# Patient Record
Sex: Male | Born: 1955 | Race: Black or African American | Hispanic: No | Smoking: Former smoker
Health system: Southern US, Community
[De-identification: ages and names within clinical notes are randomized; demographics above are authoritative.]

## PROBLEM LIST (undated history)

## (undated) DIAGNOSIS — I219 Acute myocardial infarction, unspecified: Secondary | ICD-10-CM

## (undated) DIAGNOSIS — I2510 Atherosclerotic heart disease of native coronary artery without angina pectoris: Secondary | ICD-10-CM

## (undated) DIAGNOSIS — I469 Cardiac arrest, cause unspecified: Secondary | ICD-10-CM

## (undated) DIAGNOSIS — I639 Cerebral infarction, unspecified: Secondary | ICD-10-CM

## (undated) DIAGNOSIS — I499 Cardiac arrhythmia, unspecified: Secondary | ICD-10-CM

## (undated) HISTORY — DX: Cerebral infarction, unspecified: I63.9

## (undated) HISTORY — PX: CARDIAC CATHETERIZATION: SHX172

---

## 1999-01-05 ENCOUNTER — Encounter: Payer: Self-pay | Admitting: Internal Medicine

## 1999-01-05 ENCOUNTER — Ambulatory Visit (HOSPITAL_COMMUNITY): Admission: RE | Admit: 1999-01-05 | Discharge: 1999-01-05 | Payer: Self-pay | Admitting: Internal Medicine

## 2005-10-07 ENCOUNTER — Emergency Department (HOSPITAL_COMMUNITY): Admission: EM | Admit: 2005-10-07 | Discharge: 2005-10-07 | Payer: Self-pay | Admitting: Emergency Medicine

## 2006-02-22 ENCOUNTER — Observation Stay (HOSPITAL_COMMUNITY): Admission: EM | Admit: 2006-02-22 | Discharge: 2006-02-23 | Payer: Self-pay | Admitting: Emergency Medicine

## 2006-02-22 ENCOUNTER — Ambulatory Visit: Payer: Self-pay | Admitting: Cardiology

## 2006-02-25 ENCOUNTER — Ambulatory Visit: Payer: Self-pay | Admitting: Internal Medicine

## 2006-02-25 ENCOUNTER — Ambulatory Visit (HOSPITAL_COMMUNITY): Admission: RE | Admit: 2006-02-25 | Discharge: 2006-02-25 | Payer: Self-pay | Admitting: Cardiology

## 2010-03-24 ENCOUNTER — Encounter: Payer: Self-pay | Admitting: Cardiology

## 2020-07-21 ENCOUNTER — Inpatient Hospital Stay (HOSPITAL_COMMUNITY): Payer: Medicare Other

## 2020-07-21 ENCOUNTER — Inpatient Hospital Stay (HOSPITAL_COMMUNITY)
Admission: EM | Admit: 2020-07-21 | Discharge: 2020-08-10 | DRG: 246 | Disposition: A | Discharge status: Rehabilitation Facility | Payer: Medicare Other | Attending: Internal Medicine | Admitting: Internal Medicine

## 2020-07-21 ENCOUNTER — Inpatient Hospital Stay (HOSPITAL_COMMUNITY)
Admission: EM | Disposition: A | Discharge status: Rehabilitation Facility | Payer: Self-pay | Source: Home / Self Care | Attending: Cardiology

## 2020-07-21 ENCOUNTER — Emergency Department (HOSPITAL_COMMUNITY): Payer: Medicare Other

## 2020-07-21 DIAGNOSIS — I2510 Atherosclerotic heart disease of native coronary artery without angina pectoris: Secondary | ICD-10-CM | POA: Diagnosis present

## 2020-07-21 DIAGNOSIS — I255 Ischemic cardiomyopathy: Secondary | ICD-10-CM | POA: Diagnosis present

## 2020-07-21 DIAGNOSIS — I5022 Chronic systolic (congestive) heart failure: Secondary | ICD-10-CM | POA: Diagnosis not present

## 2020-07-21 DIAGNOSIS — R1312 Dysphagia, oropharyngeal phase: Secondary | ICD-10-CM | POA: Diagnosis present

## 2020-07-21 DIAGNOSIS — I2102 ST elevation (STEMI) myocardial infarction involving left anterior descending coronary artery: Secondary | ICD-10-CM | POA: Diagnosis present

## 2020-07-21 DIAGNOSIS — J9601 Acute respiratory failure with hypoxia: Secondary | ICD-10-CM | POA: Diagnosis present

## 2020-07-21 DIAGNOSIS — J90 Pleural effusion, not elsewhere classified: Secondary | ICD-10-CM | POA: Diagnosis present

## 2020-07-21 DIAGNOSIS — E876 Hypokalemia: Secondary | ICD-10-CM | POA: Diagnosis present

## 2020-07-21 DIAGNOSIS — I6330 Cerebral infarction due to thrombosis of unspecified cerebral artery: Secondary | ICD-10-CM | POA: Diagnosis not present

## 2020-07-21 DIAGNOSIS — E870 Hyperosmolality and hypernatremia: Secondary | ICD-10-CM | POA: Diagnosis present

## 2020-07-21 DIAGNOSIS — G931 Anoxic brain damage, not elsewhere classified: Secondary | ICD-10-CM | POA: Diagnosis present

## 2020-07-21 DIAGNOSIS — Z4659 Encounter for fitting and adjustment of other gastrointestinal appliance and device: Secondary | ICD-10-CM

## 2020-07-21 DIAGNOSIS — R1310 Dysphagia, unspecified: Secondary | ICD-10-CM | POA: Diagnosis not present

## 2020-07-21 DIAGNOSIS — E119 Type 2 diabetes mellitus without complications: Secondary | ICD-10-CM | POA: Diagnosis present

## 2020-07-21 DIAGNOSIS — I469 Cardiac arrest, cause unspecified: Secondary | ICD-10-CM

## 2020-07-21 DIAGNOSIS — I10 Essential (primary) hypertension: Secondary | ICD-10-CM | POA: Diagnosis not present

## 2020-07-21 DIAGNOSIS — I639 Cerebral infarction, unspecified: Secondary | ICD-10-CM | POA: Diagnosis present

## 2020-07-21 DIAGNOSIS — Z9289 Personal history of other medical treatment: Secondary | ICD-10-CM

## 2020-07-21 DIAGNOSIS — E785 Hyperlipidemia, unspecified: Secondary | ICD-10-CM | POA: Diagnosis present

## 2020-07-21 DIAGNOSIS — E43 Unspecified severe protein-calorie malnutrition: Secondary | ICD-10-CM | POA: Diagnosis present

## 2020-07-21 DIAGNOSIS — Z7189 Other specified counseling: Secondary | ICD-10-CM | POA: Diagnosis not present

## 2020-07-21 DIAGNOSIS — R570 Cardiogenic shock: Secondary | ICD-10-CM

## 2020-07-21 DIAGNOSIS — Z0189 Encounter for other specified special examinations: Secondary | ICD-10-CM

## 2020-07-21 DIAGNOSIS — R059 Cough, unspecified: Secondary | ICD-10-CM | POA: Diagnosis not present

## 2020-07-21 DIAGNOSIS — Z7982 Long term (current) use of aspirin: Secondary | ICD-10-CM

## 2020-07-21 DIAGNOSIS — F17210 Nicotine dependence, cigarettes, uncomplicated: Secondary | ICD-10-CM | POA: Diagnosis present

## 2020-07-21 DIAGNOSIS — I462 Cardiac arrest due to underlying cardiac condition: Secondary | ICD-10-CM | POA: Diagnosis present

## 2020-07-21 DIAGNOSIS — I110 Hypertensive heart disease with heart failure: Secondary | ICD-10-CM | POA: Diagnosis present

## 2020-07-21 DIAGNOSIS — E46 Unspecified protein-calorie malnutrition: Secondary | ICD-10-CM

## 2020-07-21 DIAGNOSIS — I501 Left ventricular failure: Secondary | ICD-10-CM | POA: Diagnosis present

## 2020-07-21 DIAGNOSIS — N179 Acute kidney failure, unspecified: Secondary | ICD-10-CM | POA: Diagnosis present

## 2020-07-21 DIAGNOSIS — R339 Retention of urine, unspecified: Secondary | ICD-10-CM | POA: Diagnosis present

## 2020-07-21 DIAGNOSIS — G9341 Metabolic encephalopathy: Secondary | ICD-10-CM | POA: Diagnosis present

## 2020-07-21 DIAGNOSIS — R4182 Altered mental status, unspecified: Secondary | ICD-10-CM | POA: Diagnosis not present

## 2020-07-21 DIAGNOSIS — J690 Pneumonitis due to inhalation of food and vomit: Secondary | ICD-10-CM | POA: Diagnosis not present

## 2020-07-21 DIAGNOSIS — Y92009 Unspecified place in unspecified non-institutional (private) residence as the place of occurrence of the external cause: Secondary | ICD-10-CM

## 2020-07-21 DIAGNOSIS — I213 ST elevation (STEMI) myocardial infarction of unspecified site: Secondary | ICD-10-CM | POA: Diagnosis not present

## 2020-07-21 DIAGNOSIS — Y95 Nosocomial condition: Secondary | ICD-10-CM | POA: Diagnosis present

## 2020-07-21 DIAGNOSIS — K7200 Acute and subacute hepatic failure without coma: Secondary | ICD-10-CM | POA: Diagnosis present

## 2020-07-21 DIAGNOSIS — Z681 Body mass index (BMI) 19 or less, adult: Secondary | ICD-10-CM | POA: Diagnosis not present

## 2020-07-21 DIAGNOSIS — I4901 Ventricular fibrillation: Secondary | ICD-10-CM | POA: Diagnosis present

## 2020-07-21 DIAGNOSIS — R0902 Hypoxemia: Secondary | ICD-10-CM

## 2020-07-21 DIAGNOSIS — J189 Pneumonia, unspecified organism: Secondary | ICD-10-CM

## 2020-07-21 DIAGNOSIS — G253 Myoclonus: Secondary | ICD-10-CM | POA: Diagnosis present

## 2020-07-21 DIAGNOSIS — D638 Anemia in other chronic diseases classified elsewhere: Secondary | ICD-10-CM | POA: Diagnosis present

## 2020-07-21 DIAGNOSIS — I509 Heart failure, unspecified: Secondary | ICD-10-CM

## 2020-07-21 DIAGNOSIS — Z9889 Other specified postprocedural states: Secondary | ICD-10-CM

## 2020-07-21 DIAGNOSIS — I5021 Acute systolic (congestive) heart failure: Secondary | ICD-10-CM | POA: Diagnosis not present

## 2020-07-21 DIAGNOSIS — Z7902 Long term (current) use of antithrombotics/antiplatelets: Secondary | ICD-10-CM

## 2020-07-21 DIAGNOSIS — R5381 Other malaise: Secondary | ICD-10-CM | POA: Diagnosis not present

## 2020-07-21 DIAGNOSIS — J811 Chronic pulmonary edema: Secondary | ICD-10-CM | POA: Diagnosis not present

## 2020-07-21 DIAGNOSIS — I252 Old myocardial infarction: Secondary | ICD-10-CM

## 2020-07-21 DIAGNOSIS — R531 Weakness: Secondary | ICD-10-CM | POA: Diagnosis not present

## 2020-07-21 DIAGNOSIS — I63412 Cerebral infarction due to embolism of left middle cerebral artery: Secondary | ICD-10-CM | POA: Diagnosis not present

## 2020-07-21 DIAGNOSIS — R7401 Elevation of levels of liver transaminase levels: Secondary | ICD-10-CM

## 2020-07-21 DIAGNOSIS — Z20822 Contact with and (suspected) exposure to covid-19: Secondary | ICD-10-CM | POA: Diagnosis present

## 2020-07-21 DIAGNOSIS — I6389 Other cerebral infarction: Secondary | ICD-10-CM | POA: Diagnosis not present

## 2020-07-21 DIAGNOSIS — Z515 Encounter for palliative care: Secondary | ICD-10-CM | POA: Diagnosis not present

## 2020-07-21 DIAGNOSIS — R682 Dry mouth, unspecified: Secondary | ICD-10-CM | POA: Diagnosis present

## 2020-07-21 DIAGNOSIS — I63519 Cerebral infarction due to unspecified occlusion or stenosis of unspecified middle cerebral artery: Secondary | ICD-10-CM | POA: Diagnosis not present

## 2020-07-21 DIAGNOSIS — Z452 Encounter for adjustment and management of vascular access device: Secondary | ICD-10-CM

## 2020-07-21 DIAGNOSIS — I480 Paroxysmal atrial fibrillation: Secondary | ICD-10-CM | POA: Diagnosis present

## 2020-07-21 DIAGNOSIS — Z603 Acculturation difficulty: Secondary | ICD-10-CM | POA: Diagnosis present

## 2020-07-21 HISTORY — PX: CORONARY/GRAFT ACUTE MI REVASCULARIZATION: CATH118305

## 2020-07-21 HISTORY — DX: Atherosclerotic heart disease of native coronary artery without angina pectoris: I25.10

## 2020-07-21 HISTORY — DX: Cardiac arrhythmia, unspecified: I49.9

## 2020-07-21 HISTORY — PX: LEFT HEART CATH AND CORONARY ANGIOGRAPHY: CATH118249

## 2020-07-21 HISTORY — PX: RIGHT HEART CATH: CATH118263

## 2020-07-21 HISTORY — DX: Cardiac arrest, cause unspecified: I46.9

## 2020-07-21 HISTORY — DX: Acute myocardial infarction, unspecified: I21.9

## 2020-07-21 LAB — COOXEMETRY PANEL
Carboxyhemoglobin: 0.7 % (ref 0.5–1.5)
Methemoglobin: 1.3 % (ref 0.0–1.5)
O2 Saturation: 64.1 %
Total hemoglobin: 10.3 g/dL — ABNORMAL LOW (ref 12.0–16.0)

## 2020-07-21 LAB — GLUCOSE, CAPILLARY
Glucose-Capillary: 256 mg/dL — ABNORMAL HIGH (ref 70–99)
Glucose-Capillary: 259 mg/dL — ABNORMAL HIGH (ref 70–99)
Glucose-Capillary: 277 mg/dL — ABNORMAL HIGH (ref 70–99)
Glucose-Capillary: 170 mg/dL — ABNORMAL HIGH (ref 70–99)
Glucose-Capillary: 101 mg/dL — ABNORMAL HIGH (ref 70–99)
Glucose-Capillary: 52 mg/dL — ABNORMAL LOW (ref 70–99)
Glucose-Capillary: 67 mg/dL — ABNORMAL LOW (ref 70–99)
Glucose-Capillary: >600 mg/dL (ref 70–99)

## 2020-07-21 LAB — COMPREHENSIVE METABOLIC PANEL
ALT: 251 U/L — ABNORMAL HIGH (ref 0–44)
ALT: 174 U/L — ABNORMAL HIGH (ref 0–44)
AST: 328 U/L — ABNORMAL HIGH (ref 15–41)
AST: 250 U/L — ABNORMAL HIGH (ref 15–41)
Albumin: 3.2 g/dL — ABNORMAL LOW (ref 3.5–5.0)
Albumin: 2.2 g/dL — ABNORMAL LOW (ref 3.5–5.0)
Alkaline Phosphatase: 118 U/L (ref 38–126)
Alkaline Phosphatase: 71 U/L (ref 38–126)
Anion gap: 11 (ref 5–15)
Anion gap: 5 (ref 5–15)
BUN: 14 mg/dL (ref 8–23)
BUN: 15 mg/dL (ref 8–23)
CO2: 20 mmol/L — ABNORMAL LOW (ref 22–32)
CO2: 32 mmol/L (ref 22–32)
Calcium: 7.8 mg/dL — ABNORMAL LOW (ref 8.9–10.3)
Calcium: 6.5 mg/dL — ABNORMAL LOW (ref 8.9–10.3)
Chloride: 105 mmol/L (ref 98–111)
Chloride: 107 mmol/L (ref 98–111)
Creatinine, Ser: 1.4 mg/dL — ABNORMAL HIGH (ref 0.61–1.24)
Creatinine, Ser: 1.28 mg/dL — ABNORMAL HIGH (ref 0.61–1.24)
GFR, Estimated: 55 mL/min — ABNORMAL LOW (ref >60)
GFR, Estimated: >60 mL/min (ref >60)
Glucose, Bld: 275 mg/dL — ABNORMAL HIGH (ref 70–99)
Glucose, Bld: 286 mg/dL — ABNORMAL HIGH (ref 70–99)
Potassium: 4.8 mmol/L (ref 3.5–5.1)
Potassium: 4 mmol/L (ref 3.5–5.1)
Sodium: 136 mmol/L (ref 135–145)
Sodium: 144 mmol/L (ref 135–145)
Total Bilirubin: 1.1 mg/dL (ref 0.3–1.2)
Total Bilirubin: 0.1 mg/dL — ABNORMAL LOW (ref 0.3–1.2)
Total Protein: 5.7 g/dL — ABNORMAL LOW (ref 6.5–8.1)
Total Protein: 3.9 g/dL — ABNORMAL LOW (ref 6.5–8.1)

## 2020-07-21 LAB — CBC WITH DIFFERENTIAL/PLATELET
Abs Immature Granulocytes: 0.18 10*3/uL — ABNORMAL HIGH (ref 0.00–0.07)
Abs Immature Granulocytes: 0.12 10*3/uL — ABNORMAL HIGH (ref 0.00–0.07)
Basophils Absolute: 0.1 10*3/uL (ref 0.0–0.1)
Basophils Absolute: 0 10*3/uL (ref 0.0–0.1)
Basophils Relative: 1 %
Basophils Relative: 0 %
Eosinophils Absolute: 0.5 10*3/uL (ref 0.0–0.5)
Eosinophils Absolute: 0.1 10*3/uL (ref 0.0–0.5)
Eosinophils Relative: 3 %
Eosinophils Relative: 0 %
HCT: 42.3 % (ref 39.0–52.0)
HCT: 29.2 % — ABNORMAL LOW (ref 39.0–52.0)
Hemoglobin: 13.8 g/dL (ref 13.0–17.0)
Hemoglobin: 9.7 g/dL — ABNORMAL LOW (ref 13.0–17.0)
Immature Granulocytes: 1 %
Immature Granulocytes: 1 %
Lymphocytes Relative: 43 %
Lymphocytes Relative: 12 %
Lymphs Abs: 5.6 10*3/uL — ABNORMAL HIGH (ref 0.7–4.0)
Lymphs Abs: 2 10*3/uL (ref 0.7–4.0)
MCH: 31.7 pg (ref 26.0–34.0)
MCH: 31.6 pg (ref 26.0–34.0)
MCHC: 32.6 g/dL (ref 30.0–36.0)
MCHC: 33.2 g/dL (ref 30.0–36.0)
MCV: 97 fL (ref 80.0–100.0)
MCV: 95.1 fL (ref 80.0–100.0)
Monocytes Absolute: 0.5 10*3/uL (ref 0.1–1.0)
Monocytes Absolute: 1.1 10*3/uL — ABNORMAL HIGH (ref 0.1–1.0)
Monocytes Relative: 3 %
Monocytes Relative: 7 %
Neutro Abs: 6.4 10*3/uL (ref 1.7–7.7)
Neutro Abs: 13.4 10*3/uL — ABNORMAL HIGH (ref 1.7–7.7)
Neutrophils Relative %: 49 %
Neutrophils Relative %: 80 %
Platelets: 216 10*3/uL (ref 150–400)
Platelets: 205 10*3/uL (ref 150–400)
RBC: 4.36 MIL/uL (ref 4.22–5.81)
RBC: 3.07 MIL/uL — ABNORMAL LOW (ref 4.22–5.81)
RDW: 13.7 % (ref 11.5–15.5)
RDW: 13.8 % (ref 11.5–15.5)
WBC: 13.2 10*3/uL — ABNORMAL HIGH (ref 4.0–10.5)
WBC: 16.7 10*3/uL — ABNORMAL HIGH (ref 4.0–10.5)
nRBC: 0 % (ref 0.0–0.2)
nRBC: 0 % (ref 0.0–0.2)

## 2020-07-21 LAB — POCT ACTIVATED CLOTTING TIME
Activated Clotting Time: 214 seconds
Activated Clotting Time: 595 seconds

## 2020-07-21 LAB — RESP PANEL BY RT-PCR (FLU A&B, COVID) ARPGX2
Influenza A by PCR: NEGATIVE
Influenza B by PCR: NEGATIVE
SARS Coronavirus 2 by RT PCR: NEGATIVE

## 2020-07-21 LAB — POCT I-STAT 7, (LYTES, BLD GAS, ICA,H+H)
Acid-Base Excess: 2 mmol/L (ref 0.0–2.0)
Acid-base deficit: 6 mmol/L — ABNORMAL HIGH (ref 0.0–2.0)
Bicarbonate: 21.7 mmol/L (ref 20.0–28.0)
Bicarbonate: 28.3 mmol/L — ABNORMAL HIGH (ref 20.0–28.0)
Calcium, Ion: 1.07 mmol/L — ABNORMAL LOW (ref 1.15–1.40)
Calcium, Ion: 0.97 mmol/L — ABNORMAL LOW (ref 1.15–1.40)
HCT: 39 % (ref 39.0–52.0)
HCT: 28 % — ABNORMAL LOW (ref 39.0–52.0)
Hemoglobin: 13.3 g/dL (ref 13.0–17.0)
Hemoglobin: 9.5 g/dL — ABNORMAL LOW (ref 13.0–17.0)
O2 Saturation: 96 %
O2 Saturation: 88 %
Patient temperature: 97.9
Potassium: 4.3 mmol/L (ref 3.5–5.1)
Potassium: 3.7 mmol/L (ref 3.5–5.1)
Sodium: 138 mmol/L (ref 135–145)
Sodium: 146 mmol/L — ABNORMAL HIGH (ref 135–145)
TCO2: 23 mmol/L (ref 22–32)
TCO2: 30 mmol/L (ref 22–32)
pCO2 arterial: 49.6 mmHg — ABNORMAL HIGH (ref 32.0–48.0)
pCO2 arterial: 53 mmHg — ABNORMAL HIGH (ref 32.0–48.0)
pH, Arterial: 7.25 — ABNORMAL LOW (ref 7.350–7.450)
pH, Arterial: 7.334 — ABNORMAL LOW (ref 7.350–7.450)
pO2, Arterial: 94 mmHg (ref 83.0–108.0)
pO2, Arterial: 57 mmHg — ABNORMAL LOW (ref 83.0–108.0)

## 2020-07-21 LAB — POCT I-STAT EG7
Acid-base deficit: 5 mmol/L — ABNORMAL HIGH (ref 0.0–2.0)
Acid-base deficit: 5 mmol/L — ABNORMAL HIGH (ref 0.0–2.0)
Acid-base deficit: 6 mmol/L — ABNORMAL HIGH (ref 0.0–2.0)
Bicarbonate: 23 mmol/L (ref 20.0–28.0)
Bicarbonate: 23.6 mmol/L (ref 20.0–28.0)
Bicarbonate: 23.9 mmol/L (ref 20.0–28.0)
Calcium, Ion: 1.09 mmol/L — ABNORMAL LOW (ref 1.15–1.40)
Calcium, Ion: 1.1 mmol/L — ABNORMAL LOW (ref 1.15–1.40)
Calcium, Ion: 1.1 mmol/L — ABNORMAL LOW (ref 1.15–1.40)
HCT: 39 % (ref 39.0–52.0)
HCT: 40 % (ref 39.0–52.0)
HCT: 40 % (ref 39.0–52.0)
Hemoglobin: 13.3 g/dL (ref 13.0–17.0)
Hemoglobin: 13.6 g/dL (ref 13.0–17.0)
Hemoglobin: 13.6 g/dL (ref 13.0–17.0)
O2 Saturation: 48 %
O2 Saturation: 53 %
O2 Saturation: 55 %
Potassium: 4.2 mmol/L (ref 3.5–5.1)
Potassium: 4.2 mmol/L (ref 3.5–5.1)
Potassium: 4.2 mmol/L (ref 3.5–5.1)
Sodium: 139 mmol/L (ref 135–145)
Sodium: 139 mmol/L (ref 135–145)
Sodium: 140 mmol/L (ref 135–145)
TCO2: 25 mmol/L (ref 22–32)
TCO2: 25 mmol/L (ref 22–32)
TCO2: 26 mmol/L (ref 22–32)
pCO2, Ven: 56.9 mmHg (ref 44.0–60.0)
pCO2, Ven: 57 mmHg (ref 44.0–60.0)
pCO2, Ven: 58.9 mmHg (ref 44.0–60.0)
pH, Ven: 7.214 — ABNORMAL LOW (ref 7.250–7.430)
pH, Ven: 7.216 — ABNORMAL LOW (ref 7.250–7.430)
pH, Ven: 7.225 — ABNORMAL LOW (ref 7.250–7.430)
pO2, Ven: 32 mmHg (ref 32.0–45.0)
pO2, Ven: 34 mmHg (ref 32.0–45.0)
pO2, Ven: 35 mmHg (ref 32.0–45.0)

## 2020-07-21 LAB — MAGNESIUM: Magnesium: 1.9 mg/dL (ref 1.7–2.4)

## 2020-07-21 LAB — HEMOGLOBIN A1C
Hgb A1c MFr Bld: 6.5 % — ABNORMAL HIGH (ref 4.8–5.6)
Mean Plasma Glucose: 139.85 mg/dL

## 2020-07-21 LAB — LIPID PANEL
Cholesterol: 172 mg/dL (ref 0–200)
HDL: 35 mg/dL — ABNORMAL LOW (ref >40)
LDL Cholesterol: 108 mg/dL — ABNORMAL HIGH (ref 0–99)
Total CHOL/HDL Ratio: 4.9 RATIO
Triglycerides: 144 mg/dL (ref <150)
VLDL: 29 mg/dL (ref 0–40)

## 2020-07-21 LAB — APTT
aPTT: 134 seconds — ABNORMAL HIGH (ref 24–36)
aPTT: 53 seconds — ABNORMAL HIGH (ref 24–36)

## 2020-07-21 LAB — POCT I-STAT, CHEM 8
BUN: 14 mg/dL (ref 8–23)
Calcium, Ion: 1.07 mmol/L — ABNORMAL LOW (ref 1.15–1.40)
Chloride: 105 mmol/L (ref 98–111)
Creatinine, Ser: 1.3 mg/dL — ABNORMAL HIGH (ref 0.61–1.24)
Glucose, Bld: 279 mg/dL — ABNORMAL HIGH (ref 70–99)
HCT: 43 % (ref 39.0–52.0)
Hemoglobin: 14.6 g/dL (ref 13.0–17.0)
Potassium: 4.2 mmol/L (ref 3.5–5.1)
Sodium: 140 mmol/L (ref 135–145)
TCO2: 21 mmol/L — ABNORMAL LOW (ref 22–32)

## 2020-07-21 LAB — TROPONIN I (HIGH SENSITIVITY)
Troponin I (High Sensitivity): 2747 ng/L (ref <18)
Troponin I (High Sensitivity): 9770 ng/L (ref <18)

## 2020-07-21 LAB — PROTIME-INR
INR: 1.4 — ABNORMAL HIGH (ref 0.8–1.2)
INR: 1.3 — ABNORMAL HIGH (ref 0.8–1.2)
Prothrombin Time: 16.9 seconds — ABNORMAL HIGH (ref 11.4–15.2)
Prothrombin Time: 15.8 seconds — ABNORMAL HIGH (ref 11.4–15.2)

## 2020-07-21 LAB — ECHOCARDIOGRAM COMPLETE
Area-P 1/2: 4.06 cm2
Height: 64 in
S' Lateral: 3.1 cm
Weight: 1813.06 oz

## 2020-07-21 LAB — LACTIC ACID, PLASMA
Lactic Acid, Venous: 3.7 mmol/L (ref 0.5–1.9)
Lactic Acid, Venous: 3.3 mmol/L (ref 0.5–1.9)

## 2020-07-21 LAB — MRSA PCR SCREENING: MRSA by PCR: NEGATIVE

## 2020-07-21 SURGERY — CORONARY/GRAFT ACUTE MI REVASCULARIZATION
Anesthesia: LOCAL

## 2020-07-21 MED ORDER — SODIUM CHLORIDE 0.9 % IV SOLN
250.0000 mL | INTRAVENOUS | Status: DC
  Administered 2020-07-25 – 2020-08-06 (×2): 250 mL via INTRAVENOUS

## 2020-07-21 MED ORDER — SODIUM CHLORIDE 0.9% FLUSH
3.0000 mL | Freq: Two times a day (BID) | INTRAVENOUS | Status: DC
  Administered 2020-07-21 – 2020-08-03 (×21): 3 mL via INTRAVENOUS

## 2020-07-21 MED ORDER — SODIUM CHLORIDE 0.9 % IV SOLN: 4.0000 ug/kg/min | INTRAVENOUS | Status: DC

## 2020-07-21 MED ORDER — TICAGRELOR 90 MG PO TABS
180.0000 mg | ORAL_TABLET | Freq: Once | ORAL | Status: AC
  Administered 2020-07-21: 180 mg via ORAL
  Filled 2020-07-21: qty 2

## 2020-07-21 MED ORDER — VERAPAMIL HCL 2.5 MG/ML IV SOLN
INTRAVENOUS | Status: DC | PRN
  Administered 2020-07-21: 10 mL via INTRA_ARTERIAL

## 2020-07-21 MED ORDER — SODIUM BICARBONATE 8.4 % IV SOLN
INTRAVENOUS | Status: DC
  Filled 2020-07-21 (×3): qty 1000

## 2020-07-21 MED ORDER — SODIUM CHLORIDE 0.9 % IV SOLN
INTRAVENOUS | Status: AC | PRN
  Administered 2020-07-21: 4 ug/kg/min via INTRAVENOUS

## 2020-07-21 MED ORDER — HEPARIN (PORCINE) IN NACL 1000-0.9 UT/500ML-% IV SOLN
INTRAVENOUS | Status: AC
  Filled 2020-07-21: qty 1000

## 2020-07-21 MED ORDER — SODIUM CHLORIDE 0.9 % IV SOLN: INTRAVENOUS | Status: DC

## 2020-07-21 MED ORDER — MIDAZOLAM 50MG/50ML (1MG/ML) PREMIX INFUSION
0.0000 mg/h | INTRAVENOUS | Status: DC
  Administered 2020-07-21: 4 mg/h via INTRAVENOUS
  Administered 2020-07-21: 5 mg/h via INTRAVENOUS
  Administered 2020-07-21: 3 mg/h via INTRAVENOUS
  Administered 2020-07-21: 4 mg/h via INTRAVENOUS
  Administered 2020-07-21: 1 mg/h via INTRAVENOUS
  Filled 2020-07-21 (×4): qty 50

## 2020-07-21 MED ORDER — ORAL CARE MOUTH RINSE
15.0000 mL | OROMUCOSAL | Status: DC
  Administered 2020-07-21 – 2020-07-31 (×99): 15 mL via OROMUCOSAL

## 2020-07-21 MED ORDER — PIPERACILLIN-TAZOBACTAM 3.375 G IVPB
3.3750 g | Freq: Three times a day (TID) | INTRAVENOUS | Status: AC
  Administered 2020-07-21 – 2020-07-27 (×21): 3.375 g via INTRAVENOUS
  Filled 2020-07-21 (×21): qty 50

## 2020-07-21 MED ORDER — NITROGLYCERIN 1 MG/10 ML FOR IR/CATH LAB
INTRA_ARTERIAL | Status: AC
  Filled 2020-07-21: qty 10

## 2020-07-21 MED ORDER — SODIUM CHLORIDE 0.9% FLUSH: 10.0000 mL | INTRAVENOUS | Status: DC | PRN

## 2020-07-21 MED ORDER — PHENYLEPHRINE HCL-NACL 10-0.9 MG/250ML-% IV SOLN
25.0000 ug/min | INTRAVENOUS | Status: AC
  Administered 2020-07-22: 80 ug/min via INTRAVENOUS
  Administered 2020-07-22: 25 ug/min via INTRAVENOUS
  Filled 2020-07-21 (×4): qty 250

## 2020-07-21 MED ORDER — PIPERACILLIN-TAZOBACTAM IN DEX 2-0.25 GM/50ML IV SOLN
2.2500 g | Freq: Three times a day (TID) | INTRAVENOUS | Status: DC
  Filled 2020-07-21 (×2): qty 50

## 2020-07-21 MED ORDER — HEPARIN SODIUM (PORCINE) 1000 UNIT/ML IJ SOLN
INTRAMUSCULAR | Status: AC
  Filled 2020-07-21: qty 1

## 2020-07-21 MED ORDER — ASPIRIN 81 MG PO CHEW
81.0000 mg | CHEWABLE_TABLET | Freq: Every day | ORAL | Status: DC
  Administered 2020-07-21 – 2020-07-25 (×5): 81 mg via ORAL
  Filled 2020-07-21 (×5): qty 1

## 2020-07-21 MED ORDER — NOREPINEPHRINE 4 MG/250ML-% IV SOLN
INTRAVENOUS | Status: AC
  Administered 2020-07-21: 4 mg
  Filled 2020-07-21: qty 250

## 2020-07-21 MED ORDER — LIDOCAINE HCL (PF) 1 % IJ SOLN
INTRAMUSCULAR | Status: AC
  Filled 2020-07-21: qty 30

## 2020-07-21 MED ORDER — MIDAZOLAM HCL 2 MG/2ML IJ SOLN
INTRAMUSCULAR | Status: AC
  Filled 2020-07-21: qty 2

## 2020-07-21 MED ORDER — ACETAMINOPHEN 325 MG PO TABS
650.0000 mg | ORAL_TABLET | ORAL | Status: DC | PRN
  Administered 2020-07-22 – 2020-07-26 (×4): 650 mg via ORAL
  Filled 2020-07-21 (×4): qty 2

## 2020-07-21 MED ORDER — FUROSEMIDE 10 MG/ML IJ SOLN
60.0000 mg | Freq: Once | INTRAMUSCULAR | Status: AC
  Administered 2020-07-21: 60 mg via INTRAVENOUS
  Filled 2020-07-21: qty 6

## 2020-07-21 MED ORDER — FENTANYL BOLUS VIA INFUSION
25.0000 ug | INTRAVENOUS | Status: DC | PRN
Start: 2020-07-21 — End: 2020-07-25
  Administered 2020-07-21 – 2020-07-23 (×6): 50 ug via INTRAVENOUS
  Administered 2020-07-25: 75 ug via INTRAVENOUS
  Filled 2020-07-21: qty 100

## 2020-07-21 MED ORDER — MIDAZOLAM BOLUS VIA INFUSION
0.0000 mg | INTRAVENOUS | Status: DC | PRN
Start: 2020-07-21 — End: 2020-07-25
  Administered 2020-07-21: 1 mg via INTRAVENOUS
  Administered 2020-07-21 (×2): 2 mg via INTRAVENOUS
  Administered 2020-07-21: 1 mg via INTRAVENOUS
  Administered 2020-07-21: 2 mg via INTRAVENOUS
  Administered 2020-07-21: 1 mg via INTRAVENOUS
  Filled 2020-07-21: qty 5

## 2020-07-21 MED ORDER — TICAGRELOR 90 MG PO TABS
90.0000 mg | ORAL_TABLET | Freq: Two times a day (BID) | ORAL | Status: DC
  Administered 2020-07-21 – 2020-07-25 (×10): 90 mg via ORAL
  Filled 2020-07-21 (×10): qty 1

## 2020-07-21 MED ORDER — HEPARIN SODIUM (PORCINE) 5000 UNIT/ML IJ SOLN
60.0000 [IU]/kg | Freq: Once | INTRAMUSCULAR | Status: AC
  Administered 2020-07-21: 5000 [IU] via INTRAVENOUS

## 2020-07-21 MED ORDER — FAMOTIDINE IN NACL 20-0.9 MG/50ML-% IV SOLN
20.0000 mg | Freq: Two times a day (BID) | INTRAVENOUS | Status: DC
  Administered 2020-07-21 – 2020-08-01 (×23): 20 mg via INTRAVENOUS
  Filled 2020-07-21 (×25): qty 50

## 2020-07-21 MED ORDER — FENTANYL 2500MCG IN NS 250ML (10MCG/ML) PREMIX INFUSION
25.0000 ug/h | INTRAVENOUS | Status: DC
  Administered 2020-07-21: 90 ug/h via INTRAVENOUS
  Administered 2020-07-21: 50 ug/h via INTRAVENOUS
  Administered 2020-07-21: 100 ug/h via INTRAVENOUS
  Administered 2020-07-23: 50 ug/h via INTRAVENOUS
  Administered 2020-07-24: 100 ug/h via INTRAVENOUS
  Administered 2020-07-25: 225 ug/h via INTRAVENOUS
  Administered 2020-07-25: 100 ug/h via INTRAVENOUS
  Filled 2020-07-21 (×8): qty 250

## 2020-07-21 MED ORDER — SODIUM BICARBONATE 8.4 % IV SOLN: 50.0000 meq | Freq: Once | INTRAVENOUS | Status: AC

## 2020-07-21 MED ORDER — SODIUM BICARBONATE 8.4 % IV SOLN
INTRAVENOUS | Status: AC
  Administered 2020-07-21: 50 meq
  Filled 2020-07-21: qty 50

## 2020-07-21 MED ORDER — ONDANSETRON HCL 4 MG/2ML IJ SOLN
4.0000 mg | Freq: Four times a day (QID) | INTRAMUSCULAR | Status: DC | PRN
  Administered 2020-07-31: 4 mg via INTRAVENOUS
  Filled 2020-07-21: qty 2

## 2020-07-21 MED ORDER — CANGRELOR TETRASODIUM 50 MG IV SOLR
INTRAVENOUS | Status: AC
  Filled 2020-07-21: qty 50

## 2020-07-21 MED ORDER — CANGRELOR BOLUS VIA INFUSION
INTRAVENOUS | Status: DC | PRN
  Administered 2020-07-21: 1650 ug via INTRAVENOUS

## 2020-07-21 MED ORDER — SODIUM CHLORIDE 0.9 % IV SOLN: INTRAVENOUS | Status: AC

## 2020-07-21 MED ORDER — HEPARIN (PORCINE) IN NACL 1000-0.9 UT/500ML-% IV SOLN
INTRAVENOUS | Status: DC | PRN
  Administered 2020-07-21 (×2): 500 mL

## 2020-07-21 MED ORDER — FENTANYL 2500MCG IN NS 250ML (10MCG/ML) PREMIX INFUSION
INTRAVENOUS | Status: AC
  Filled 2020-07-21: qty 250

## 2020-07-21 MED ORDER — IOHEXOL 350 MG/ML SOLN
INTRAVENOUS | Status: DC | PRN
  Administered 2020-07-21: 120 mL

## 2020-07-21 MED ORDER — LIDOCAINE HCL (PF) 1 % IJ SOLN
INTRAMUSCULAR | Status: DC | PRN
  Administered 2020-07-21 (×2): 2 mL

## 2020-07-21 MED ORDER — DEXTROSE 50 % IV SOLN
12.5000 g | INTRAVENOUS | Status: AC
  Administered 2020-07-21: 12.5 g via INTRAVENOUS
  Filled 2020-07-21: qty 50

## 2020-07-21 MED ORDER — MILRINONE LACTATE IN DEXTROSE 20-5 MG/100ML-% IV SOLN
0.3750 ug/kg/min | INTRAVENOUS | Status: DC
  Administered 2020-07-21 – 2020-07-23 (×3): 0.25 ug/kg/min via INTRAVENOUS
  Administered 2020-07-23 – 2020-07-29 (×8): 0.375 ug/kg/min via INTRAVENOUS
  Filled 2020-07-21 (×12): qty 100

## 2020-07-21 MED ORDER — PHENYLEPHRINE HCL-NACL 10-0.9 MG/250ML-% IV SOLN
INTRAVENOUS | Status: AC
  Administered 2020-07-21: 100 ug/min via INTRAVENOUS
  Filled 2020-07-21: qty 250

## 2020-07-21 MED ORDER — MIDAZOLAM 50MG/50ML (1MG/ML) PREMIX INFUSION
INTRAVENOUS | Status: AC
  Filled 2020-07-21: qty 50

## 2020-07-21 MED ORDER — HEPARIN SODIUM (PORCINE) 1000 UNIT/ML IJ SOLN
INTRAMUSCULAR | Status: DC | PRN
  Administered 2020-07-21: 4000 [IU] via INTRAVENOUS

## 2020-07-21 MED ORDER — SODIUM CHLORIDE 0.9% FLUSH: 3.0000 mL | INTRAVENOUS | Status: DC | PRN

## 2020-07-21 MED ORDER — HEPARIN (PORCINE) IN NACL 1000-0.9 UT/500ML-% IV SOLN
INTRAVENOUS | Status: AC
  Filled 2020-07-21: qty 500

## 2020-07-21 MED ORDER — CHLORHEXIDINE GLUCONATE 0.12% ORAL RINSE (MEDLINE KIT)
15.0000 mL | Freq: Two times a day (BID) | OROMUCOSAL | Status: DC
  Administered 2020-07-21 – 2020-07-31 (×21): 15 mL via OROMUCOSAL

## 2020-07-21 MED ORDER — NOREPINEPHRINE 16 MG/250ML-% IV SOLN
0.0000 ug/min | INTRAVENOUS | Status: DC
  Administered 2020-07-21: 20 ug/min via INTRAVENOUS
  Administered 2020-07-21 (×2): 28 ug/min via INTRAVENOUS
  Administered 2020-07-22: 30 ug/min via INTRAVENOUS
  Administered 2020-07-23: 31 ug/min via INTRAVENOUS
  Administered 2020-07-23: 32 ug/min via INTRAVENOUS
  Administered 2020-07-23: 22 ug/min via INTRAVENOUS
  Administered 2020-07-24: 29 ug/min via INTRAVENOUS
  Administered 2020-07-24: 35 ug/min via INTRAVENOUS
  Administered 2020-07-24: 38 ug/min via INTRAVENOUS
  Administered 2020-07-25: 40 ug/min via INTRAVENOUS
  Administered 2020-07-25: 30 ug/min via INTRAVENOUS
  Administered 2020-07-25: 34 ug/min via INTRAVENOUS
  Administered 2020-07-26: 33 ug/min via INTRAVENOUS
  Administered 2020-07-26: 28 ug/min via INTRAVENOUS
  Administered 2020-07-26: 40 ug/min via INTRAVENOUS
  Administered 2020-07-27: 12 ug/min via INTRAVENOUS
  Administered 2020-07-28 – 2020-07-29 (×2): 18 ug/min via INTRAVENOUS
  Administered 2020-07-30: 5 ug/min via INTRAVENOUS
  Filled 2020-07-21 (×23): qty 250

## 2020-07-21 MED ORDER — INSULIN ASPART 100 UNIT/ML IJ SOLN
0.0000 [IU] | INTRAMUSCULAR | Status: DC
  Administered 2020-07-21: 8 [IU] via SUBCUTANEOUS
  Administered 2020-07-21: 3 [IU] via SUBCUTANEOUS
  Administered 2020-07-22: 2 [IU] via SUBCUTANEOUS
  Administered 2020-07-22: 5 [IU] via SUBCUTANEOUS
  Administered 2020-07-23 (×2): 3 [IU] via SUBCUTANEOUS
  Administered 2020-07-23: 5 [IU] via SUBCUTANEOUS
  Administered 2020-07-23 (×3): 2 [IU] via SUBCUTANEOUS
  Administered 2020-07-24 (×2): 3 [IU] via SUBCUTANEOUS
  Administered 2020-07-24 (×3): 2 [IU] via SUBCUTANEOUS
  Administered 2020-07-24: 3 [IU] via SUBCUTANEOUS
  Administered 2020-07-25 (×2): 2 [IU] via SUBCUTANEOUS
  Administered 2020-07-25 – 2020-07-26 (×5): 3 [IU] via SUBCUTANEOUS
  Administered 2020-07-26: 2 [IU] via SUBCUTANEOUS
  Administered 2020-07-26: 3 [IU] via SUBCUTANEOUS
  Administered 2020-07-26: 2 [IU] via SUBCUTANEOUS
  Administered 2020-07-26 (×2): 3 [IU] via SUBCUTANEOUS
  Administered 2020-07-27: 2 [IU] via SUBCUTANEOUS
  Administered 2020-07-27 – 2020-07-28 (×5): 3 [IU] via SUBCUTANEOUS
  Administered 2020-07-28: 5 [IU] via SUBCUTANEOUS
  Administered 2020-07-28: 3 [IU] via SUBCUTANEOUS
  Administered 2020-07-28 (×2): 2 [IU] via SUBCUTANEOUS
  Administered 2020-07-28: 3 [IU] via SUBCUTANEOUS
  Administered 2020-07-29 (×2): 2 [IU] via SUBCUTANEOUS
  Administered 2020-07-29 (×4): 3 [IU] via SUBCUTANEOUS
  Administered 2020-07-30: 5 [IU] via SUBCUTANEOUS
  Administered 2020-07-30 – 2020-07-31 (×5): 3 [IU] via SUBCUTANEOUS
  Administered 2020-07-31: 8 [IU] via SUBCUTANEOUS
  Administered 2020-07-31 (×3): 3 [IU] via SUBCUTANEOUS
  Administered 2020-08-01 (×6): 5 [IU] via SUBCUTANEOUS
  Administered 2020-08-01: 8 [IU] via SUBCUTANEOUS
  Administered 2020-08-02 (×2): 5 [IU] via SUBCUTANEOUS
  Administered 2020-08-02 (×2): 3 [IU] via SUBCUTANEOUS
  Administered 2020-08-02: 8 [IU] via SUBCUTANEOUS
  Administered 2020-08-03: 3 [IU] via SUBCUTANEOUS
  Administered 2020-08-03: 5 [IU] via SUBCUTANEOUS
  Administered 2020-08-03 (×4): 3 [IU] via SUBCUTANEOUS
  Administered 2020-08-03: 5 [IU] via SUBCUTANEOUS
  Administered 2020-08-04: 3 [IU] via SUBCUTANEOUS
  Administered 2020-08-04: 5 [IU] via SUBCUTANEOUS
  Administered 2020-08-04: 8 [IU] via SUBCUTANEOUS
  Administered 2020-08-05: 3 [IU] via SUBCUTANEOUS
  Administered 2020-08-05: 2 [IU] via SUBCUTANEOUS
  Administered 2020-08-05: 3 [IU] via SUBCUTANEOUS
  Administered 2020-08-05: 5 [IU] via SUBCUTANEOUS
  Administered 2020-08-05: 2 [IU] via SUBCUTANEOUS
  Administered 2020-08-06 (×2): 3 [IU] via SUBCUTANEOUS
  Administered 2020-08-06: 5 [IU] via SUBCUTANEOUS
  Administered 2020-08-06 – 2020-08-07 (×2): 2 [IU] via SUBCUTANEOUS
  Administered 2020-08-07 (×2): 3 [IU] via SUBCUTANEOUS
  Administered 2020-08-07: 2 [IU] via SUBCUTANEOUS
  Administered 2020-08-08: 3 [IU] via SUBCUTANEOUS
  Administered 2020-08-09: 2 [IU] via SUBCUTANEOUS
  Administered 2020-08-09: 3 [IU] via SUBCUTANEOUS
  Administered 2020-08-09 (×2): 5 [IU] via SUBCUTANEOUS
  Administered 2020-08-09: 3 [IU] via SUBCUTANEOUS
  Administered 2020-08-10 (×2): 2 [IU] via SUBCUTANEOUS

## 2020-07-21 MED ORDER — VERAPAMIL HCL 2.5 MG/ML IV SOLN
INTRAVENOUS | Status: AC
  Filled 2020-07-21: qty 2

## 2020-07-21 MED ORDER — CHLORHEXIDINE GLUCONATE CLOTH 2 % EX PADS
6.0000 | MEDICATED_PAD | Freq: Every day | CUTANEOUS | Status: DC
  Administered 2020-07-21 – 2020-08-07 (×20): 6 via TOPICAL

## 2020-07-21 MED ORDER — ROSUVASTATIN CALCIUM 20 MG PO TABS
40.0000 mg | ORAL_TABLET | Freq: Every day | ORAL | Status: DC
  Administered 2020-07-21 – 2020-07-25 (×5): 40 mg via ORAL
  Filled 2020-07-21 (×5): qty 2

## 2020-07-21 MED ORDER — ASPIRIN 81 MG PO CHEW: 324.0000 mg | CHEWABLE_TABLET | Freq: Once | ORAL | Status: DC

## 2020-07-21 MED ORDER — FENTANYL CITRATE (PF) 100 MCG/2ML IJ SOLN: 25.0000 ug | Freq: Once | INTRAMUSCULAR | Status: DC

## 2020-07-21 MED ORDER — HEPARIN SODIUM (PORCINE) 5000 UNIT/ML IJ SOLN
5000.0000 [IU] | Freq: Three times a day (TID) | INTRAMUSCULAR | Status: DC
  Administered 2020-07-21 – 2020-07-23 (×6): 5000 [IU] via SUBCUTANEOUS
  Filled 2020-07-21 (×6): qty 1

## 2020-07-21 MED ORDER — SODIUM BICARBONATE 8.4 % IV SOLN
INTRAVENOUS | Status: AC
  Administered 2020-07-21: 50 meq via INTRAVENOUS
  Filled 2020-07-21: qty 50

## 2020-07-21 MED ORDER — SODIUM CHLORIDE 0.9% FLUSH
10.0000 mL | Freq: Two times a day (BID) | INTRAVENOUS | Status: DC
  Administered 2020-07-21 – 2020-08-02 (×14): 10 mL
  Administered 2020-08-03: 20 mL
  Administered 2020-08-03 – 2020-08-07 (×7): 10 mL

## 2020-07-21 MED ORDER — ASPIRIN 300 MG RE SUPP: 300.0000 mg | Freq: Once | RECTAL | Status: DC

## 2020-07-21 MED ORDER — SODIUM BICARBONATE 8.4 % IV SOLN
100.0000 meq | Freq: Once | INTRAVENOUS | Status: AC
  Administered 2020-07-21: 100 meq via INTRAVENOUS

## 2020-07-21 MED ORDER — SODIUM CHLORIDE 0.9 % IV SOLN: 250.0000 mL | INTRAVENOUS | Status: DC | PRN

## 2020-07-21 SURGICAL SUPPLY — 24 items
BALL SAPPHIRE NC24 3.0X22 (BALLOONS) ×2
BALLN SAPPHIRE 2.5X12 (BALLOONS) ×2
BALLOON SAPPHIRE 2.5X12 (BALLOONS) IMPLANT
BALLOON SAPPHIRE NC24 3.0X22 (BALLOONS) IMPLANT
CATH 5FR JL3.5 JR4 ANG PIG MP (CATHETERS) ×1 IMPLANT
CATH SWAN GANZ 7F STRAIGHT (CATHETERS) ×1 IMPLANT
CATH VISTA GUIDE 6FR XB3 (CATHETERS) ×1 IMPLANT
DEVICE RAD TR BAND REGULAR (VASCULAR PRODUCTS) ×1 IMPLANT
GLIDESHEATH SLEND SS 6F .021 (SHEATH) ×1 IMPLANT
GLIDESHEATH SLENDER 7FR .021G (SHEATH) ×1 IMPLANT
GUIDEWIRE INQWIRE 1.5J.035X260 (WIRE) IMPLANT
INQWIRE 1.5J .035X260CM (WIRE) ×2
KIT ENCORE 26 ADVANTAGE (KITS) ×1 IMPLANT
KIT HEART LEFT (KITS) ×2 IMPLANT
PACK CARDIAC CATHETERIZATION (CUSTOM PROCEDURE TRAY) ×2 IMPLANT
SHEATH PINNACLE 6F 10CM (SHEATH) ×1 IMPLANT
SHEATH PROBE COVER 6X72 (BAG) ×1 IMPLANT
STENT SYNERGY XD 2.50X38 (Permanent Stent) IMPLANT
SYNERGY XD 2.50X38 (Permanent Stent) ×2 IMPLANT
TRANSDUCER W/STOPCOCK (MISCELLANEOUS) ×2 IMPLANT
TUBING CIL FLEX 10 FLL-RA (TUBING) ×2 IMPLANT
WIRE ASAHI PROWATER 180CM (WIRE) ×1 IMPLANT
WIRE EMERALD 3MM-J .035X150CM (WIRE) ×1 IMPLANT
WIRE PT2 MS 185 (WIRE) ×1 IMPLANT

## 2020-07-21 NOTE — Progress Notes (Signed)
Routine EEG complete, LTM in progress, result pending

## 2020-07-21 NOTE — Progress Notes (Signed)
  Echocardiogram 2D Echocardiogram has been performed.  Delcie Roch 07/21/2020, 11:26 AM

## 2020-07-21 NOTE — ED Notes (Signed)
Given fentanyl and 5mg  versed by EMS pta.

## 2020-07-21 NOTE — Consult Note (Signed)
NAMEBenino Horton, MRN:  [id number], DOB:  01-28-1956, LOS: 0 ADMISSION DATE:  07/21/2020, CONSULTATION DATE:  07/22/2019 REFERRING MD:  [place], CHIEF COMPLAINT:  Post arrest   History of Present Illness:  Patient is a 65 year old male with limited medical history found down by family, EMS was called, he was found to be in V. fib shocked 5 x 3 mg epinephrine 450 mg of amiodarone with ROSC obtained.  He was emergently taken to the Cath Lab showing ST segment changes in anterior lateral leads.  He was noted to have been moving spontaneously at time of cardiac catheterization. Shortly after my arrival to bedside patient's blood pressure dropped to a MAP of 44.  Neo-Synephrine had been started by eICU.  Patient was given a 500 cc fluid bolus.  Levophed was added.  Patient seemed to respond best to bicarbonate with significant improvement in his blood pressure postinfusion.  Bicarbonate drip was started.  Because of his tenuous blood pressure central line was started family.  Art line was not obtainable.  With initiation of bicarbonate drip Levophed was weaned map was in the high 60s with 100 mics of Neo-Synephrine.  Patient did have nonpurposeful spontaneous movement that I do not believe is myoclonus.  Patient will be placed on normothermic protocol.  Chest x-ray shows right-sided infiltrate greater than left consistent with possible aspiration.  Pertinent  Medical History  None available  Significant Hospital Events: Including procedures, antibiotic start and stop dates in addition to other pertinent events    resuscitation prior to admission Cardiac catheterization  Interim History / Subjective:  As above  Objective   Blood pressure 99/75, pulse 87, temperature (!) 96.3 F (35.7 C), temperature source Temporal, resp. rate 18, height 5\' 4"  (1.626 m), SpO2 100 %.    Vent Mode: PRVC FiO2 (%):  [100 %] 100 % Set Rate:  [18 bmp-22 bmp] 22 bmp Vt Set:  [470 mL] 470 mL PEEP:  [5 cmH20] 5  cmH20 Plateau Pressure:  [25 cmH20] 25 cmH20  No intake or output data in the 24 hours ending 07/21/20 0321 There were no vitals filed for this visit.  Examination: General: Thin male in no distress HENT: Within normal limits Lungs: Right-sided coarse breath sounds Cardiovascular: Regular rate and rhythm Abdomen: Benign bowel sounds positive Extremities: Within normal limits Neuro: Some spontaneous movement not purposeful GU: Within normal limits  Labs/imaging that I havepersonally reviewed  (right click and "Reselect all SmartList Selections" daily)  Chest x-ray is reviewed  Resolved Hospital Problem list   As below  Assessment & Plan:  1.  Status post STEMI post LAD stent 2.  Respiratory failure with possible right-sided aspiration pneumonia 3.  Respiratory failure requiring mechanical ventilation 4.  Hypotension requiring vasopressors 5.  Post V. fib arrest will ensure normothermia protocol  Best practice (right click and "Reselect all SmartList Selections" daily)  Diet:  NPO Pain/Anxiety/Delirium protocol (if indicated): No VAP protocol (if indicated): Yes DVT prophylaxis: SCD GI prophylaxis: H2B Glucose control:  SSI No Central venous access:  Yes, and it is still needed Arterial line:  N/A Foley:  Yes, and it is still needed Mobility:  bed rest  PT consulted: N/A Last date of multidisciplinary goals of care discussion NA] Code Status:  full code Disposition: ICU  Labs   CBC: Recent Labs  Lab 07/21/20 0109 07/21/20 0149 07/21/20 0233 07/21/20 0244 07/21/20 0245 07/21/20 0249  WBC 13.2*  --   --   --   --   --  NEUTROABS 6.4  --   --   --   --   --   HGB 13.8 14.6 13.3 13.6 13.3 13.6  HCT 42.3 43.0 39.0 40.0 39.0 40.0  MCV 97.0  --   --   --   --   --   PLT 216  --   --   --   --   --     Basic Metabolic Panel: Recent Labs  Lab 07/21/20 0109 07/21/20 0149 07/21/20 0233 07/21/20 0244 07/21/20 0245 07/21/20 0249  NA 136 140 138 140 139 139   K 4.8 4.2 4.3 4.2 4.2 4.2  CL 105 105  --   --   --   --   CO2 20*  --   --   --   --   --   GLUCOSE 275* 279*  --   --   --   --   BUN 14 14  --   --   --   --   CREATININE 1.40* 1.30*  --   --   --   --   CALCIUM 7.8*  --   --   --   --   --    GFR: CrCl cannot be calculated (Unknown ideal weight.). Recent Labs  Lab 07/21/20 0109  WBC 13.2*    Liver Function Tests: Recent Labs  Lab 07/21/20 0109  AST 328*  ALT 251*  ALKPHOS 118  BILITOT 1.1  PROT 5.7*  ALBUMIN 3.2*   No results for input(s): LIPASE, AMYLASE in the last 168 hours. No results for input(s): AMMONIA in the last 168 hours.  ABG    Component Value Date/Time   PHART 7.250 (L) 07/21/2020 0233   PCO2ART 49.6 (H) 07/21/2020 0233   PO2ART 94 07/21/2020 0233   HCO3 23.0 07/21/2020 0249   TCO2 25 07/21/2020 0249   ACIDBASEDEF 6.0 (H) 07/21/2020 0249   O2SAT 53.0 07/21/2020 0249     Coagulation Profile: No results for input(s): INR, PROTIME in the last 168 hours.  Cardiac Enzymes: No results for input(s): CKTOTAL, CKMB, CKMBINDEX, TROPONINI in the last 168 hours.  HbA1C: Hgb A1c MFr Bld  Date/Time Value Ref Range Status  07/21/2020 01:09 AM 6.5 (H) 4.8 - 5.6 % Final    Comment:    (NOTE) Pre diabetes:          5.7%-6.4%  Diabetes:              >6.4%  Glycemic control for   <7.0% adults with diabetes     CBG: No results for input(s): GLUCAP in the last 168 hours.  Review of Systems:   unobtainable  Past Medical History:  He,  has no past medical history on file.   Surgical History:  Not available  Social History:    not available  Family History:  His family history is not on file.   Allergies Not on File   Home Medications  Prior to Admission medications   Not on File     Critical care time: 1 hour was spent in bedside care chart review and critical care planning not including procedures

## 2020-07-21 NOTE — ED Triage Notes (Addendum)
Pt BIB [facility] from home, pt found unresponsive by family, on EMS arrival, pt pulseless and apneic. Initially in vfib, total of 5 shocks, 3 epis, 450mg  amio and 15 minutes CPR. Pt agitated on arrival, given 2mg  versed IV. Per family, pt c/o chest pain yesterday.

## 2020-07-21 NOTE — Procedures (Signed)
Central Venous Catheter Insertion Procedure Note  Procedure: Insertion of Central Venous Catheter  Indications:  vascular access  Procedure Details  Informed consent was obtained for the procedure, including sedation.  Risks of lung perforation, hemorrhage, arrhythmia, and adverse drug reaction were discussed.   Maximum sterile technique was used including antiseptics, cap, gloves, gown, hand hygiene, mask and sheet.  Under sterile conditions the skin above the on the right femoral vein was prepped with betadine and covered with a sterile drape. Local anesthesia was applied to the skin and subcutaneous tissues. A 22-gauge needle was used to identify the vein. An 18-gauge needle was then inserted into the vein. A guide wire was then passed easily through the catheter. There were no arrhythmias. The catheter was then withdrawn. A 7.0 French triple-lumen was then inserted into the vessel over the guide wire. The catheter was sutured into place.  Findings: There were no changes to vital signs. Catheter was flushed with 10 cc NS. Patient did tolerate procedure well.  Recommendations: CXR ordered to verify placement.

## 2020-07-21 NOTE — Procedures (Signed)
Central Venous Catheter Insertion Procedure Note  Jack Bolio  [id number]  May 13, 1955  Date:07/21/20  Time:8:30 AM   Provider Performing:Jasara Corrigan   Procedure: Insertion of Non-tunneled Central Venous [id number]) with US guidance (42876)   Indication(s) Medication administration  Consent Risks of the procedure as well as the alternatives and risks of each were explained to the patient and/or caregiver.  Consent for the procedure was obtained and is signed in the bedside chart  Anesthesia Topical only with 1% lidocaine   Timeout Verified patient identification, verified procedure, site/side was marked, verified correct patient position, special equipment/implants available, medications/allergies/relevant history reviewed, required imaging and test results available.  Sterile Technique Maximal sterile technique including full sterile barrier drape, hand hygiene, sterile gown, sterile gloves, mask, hair covering, sterile ultrasound probe cover (if used).  Procedure Description Area of catheter insertion was cleaned with chlorhexidine and draped in sterile fashion.  Ultrasound was used to visualize the veins and guide needle entry with real-time visualization. With real-time ultrasound guidance a central venous catheter was placed into the right internal jugular vein. Nonpulsatile blood flow and easy flushing noted in all ports.  The catheter was sutured in place and sterile dressing applied.  Complications/Tolerance None; patient tolerated the procedure well. Chest X-ray is ordered to verify placement for internal jugular or subclavian cannulation.   Chest x-ray is not ordered for femoral cannulation.  EBL Minimal  Specimen(s) None    Chilton Greathouse MD [facility] 07/21/2020, 8:33 AM

## 2020-07-21 NOTE — Progress Notes (Signed)
ABG collected, but sample is mixed venous.

## 2020-07-21 NOTE — Progress Notes (Signed)
Went to bedside to D/C IO as ordered. IO was currently infusing critical meds, RN told this Vast Nurse to leave IO until CVL placement completed. Will consult Vast again once IO ready to be pulled out.

## 2020-07-21 NOTE — Progress Notes (Signed)
Patient ID: Vincent Horton, male   DOB: [birth year], 65 y.o.   MRN: [id number]     Advanced Heart Failure Rounding Note  PCP-Cardiologist: None   Subjective:    Unable to place arterial line overnight.  Has emergently placed femoral central line. Currently on milrinone 0.25, NE 20, phenylephrine 100.  SBP now 90s-100s.  He is on lactate gtt 125 cc/hr and NS 50 cc/hr.  Lactate 3.7 early am.   He is on hypothermia protocol, appears to have myoclonus.   Zosyn for possible aspiration PNA.    Objective:   Weight Range: 51.4 kg Body mass index is 19.45 kg/m.   Vital Signs:   Temp:  [96.3 F (35.7 C)-99.86 F (37.7 C)] 99.1 F (37.3 C) (05/21 0700) Pulse Rate:  [87-97] 91 (05/21 0630) Resp:  [18-22] 22 (05/21 0630) BP: (89-101)/(73-77) 101/77 (05/21 0630) SpO2:  [99 %-100 %] 99 % (05/21 0630) FiO2 (%):  [90 %-100 %] 90 % (05/21 0500) Weight:  [51.4 kg-55 kg] 51.4 kg (05/21 0330)    Weight change: Filed Weights   07/21/20 0302 07/21/20 0330  Weight: 55 kg 51.4 kg    Intake/Output:   Intake/Output Summary (Last 24 hours) at 07/21/2020 0734 Last data filed at 07/21/2020 0645 Gross per 24 hour  Intake --  Output 575 ml  Net -575 ml      Physical Exam    General:  Sedated/intubated HEENT: Normal Neck: Supple. JVP 12 cm. Carotids 2+ bilat; no bruits. No lymphadenopathy or thyromegaly appreciated. Cor: PMI nondisplaced. Regular rate & rhythm. No rubs, gallops or murmurs. Lungs: Decreased at bases. Rhonchi.  Abdomen: Soft, nontender, nondistended. No hepatosplenomegaly. No bruits or masses. Good bowel sounds. Extremities: No cyanosis, clubbing, rash, edema Neuro: Sedated.  Appears to have myoclonus.    Telemetry   Sinus tachy 100s (personally reviewed)  EKG    NSR, residual anterior STE (personally reviewed)  Labs    CBC Recent Labs    07/21/20 0109 07/21/20 0149 07/21/20 0429 07/21/20 0505  WBC 13.2*  --  16.7*  --   NEUTROABS 6.4  --  13.4*  --   HGB 13.8    < > 9.7* 9.5*  HCT 42.3   < > 29.2* 28.0*  MCV 97.0  --  95.1  --   PLT 216  --  205  --    < > = values in this interval not displayed.   Basic Metabolic Panel Recent Labs    07/21/20 0109 07/21/20 0149 07/21/20 0233 07/21/20 0429 07/21/20 0505  NA 136 140   < > 144 146*  K 4.8 4.2   < > 4.0 3.7  CL 105 105  --  107  --   CO2 20*  --   --  32  --   GLUCOSE 275* 279*  --  286*  --   BUN 14 14  --  15  --   CREATININE 1.40* 1.30*  --  1.28*  --   CALCIUM 7.8*  --   --  6.5*  --   MG  --   --   --  1.9  --    < > = values in this interval not displayed.   Liver Function Tests Recent Labs    07/21/20 0109 07/21/20 0429  AST 328* 250*  ALT 251* 174*  ALKPHOS 118 71  BILITOT 1.1 0.1*  PROT 5.7* 3.9*  ALBUMIN 3.2* 2.2*   No results for input(s): LIPASE, AMYLASE in the last  72 hours. Cardiac Enzymes No results for input(s): CKTOTAL, CKMB, CKMBINDEX, TROPONINI in the last 72 hours.  BNP: BNP (last 3 results) No results for input(s): BNP in the last 8760 hours.  ProBNP (last 3 results) No results for input(s): PROBNP in the last 8760 hours.   D-Dimer No results for input(s): DDIMER in the last 72 hours. Hemoglobin A1C Recent Labs    07/21/20 0109  HGBA1C 6.5*   Fasting Lipid Panel Recent Labs    07/21/20 0109  CHOL 172  HDL 35*  LDLCALC 108*  TRIG 144  CHOLHDL 4.9   Thyroid Function Tests No results for input(s): TSH, T4TOTAL, T3FREE, THYROIDAB in the last 72 hours.  Invalid input(s): FREET3  Other results:   Imaging    CARDIAC CATHETERIZATION  Result Date: 07/21/2020  Prox LAD to Mid LAD lesion is 100% stenosed.  Post intervention, there is a 0% residual stenosis.  A drug-eluting stent was successfully placed using a SYNERGY XD [id number].  1. 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 Successful PCI of the proximal to mid LAD with DES. Some distal embolization to the apical LAD 3. Normal right heart pressures mean PAP 20 mm Hg 4.  Normal PCWP 12 mm Hg 5. Low cardiac output 2.7 L/min with index 1.73. 6. Low cardiac power 0.48. Plan: DAPT. Will load with Brilinta once NG in place. Continue IV Cangrelor until Brilinta load on board. Continue ASA and Brilinta for 12 months. Start IV milrinone. CCM to manage vent as well as place central line and arterial line. Will assess LV function with Echo.   DG CHEST PORT 1 VIEW  Result Date: 07/21/2020 CLINICAL DATA:  65 year old male enteric tube placement. STEMI, found down. EXAM: PORTABLE CHEST 1 VIEW COMPARISON:  0111 hours today. FINDINGS: Portable AP semi upright view at 0456 hours. Endotracheal tube tip remains in good position between the clavicles and carina. Enteric tube courses to the left upper quadrant, side hole at the level of the gastric body. Stable cardiac size and mediastinal contours. No pneumothorax. New veiling opacity throughout the right lung, and increased retrocardiac opacity on the left obscuring the diaphragm. Paucity of bowel gas in the upper abdomen. Stable visualized osseous structures. IMPRESSION: 1. Enteric tube courses into the stomach, side hole at the level of the gastric body. 2. Endotracheal tube tip in good position. 3. Veiling opacity in the right lung now may reflect new pleural effusion or asymmetric edema. Interval left lower lobe collapse or consolidation. Electronically Signed   By: Genevie Ann M.D.   On: 07/21/2020 05:12   DG Chest Port 1 View  Result Date: 07/21/2020 CLINICAL DATA:  STEMI. Found unresponsive by family. Pulseless and apneic. Initially in vfib, total of 5 shocks, 3 epis, 452m amio and 15 minutes CPR. Pt agitated on arrival, given 26mversed IV. EXAM: PORTABLE CHEST 1 VIEW COMPARISON:  Chest x-ray 02/22/2006 FINDINGS: Interval placement of an endotracheal tube with tip terminating 4 cm above the carina. Cardiac paddles overlie the patient. Poorly nonvisualization of the aortic arch due to overlying pulmonary findings. Otherwise the heart size  and mediastinal contours are within normal limits. Prominence of the hilar vasculature. Perihilar interstitial and airspace opacities. Increased interstitial markings diffusely. Question of Kerley B lines within the left peripheral lower lobe. No pulmonary edema. No pleural effusion. No pneumothorax. No acute osseous abnormality. IMPRESSION: 1. Pulmonary edema. Superimposed infection/inflammation not excluded. 2. Endotracheal tube with tip 4 cm above the carina. Electronically Signed   By:  Iven Finn M.D.   On: 07/21/2020 01:32      Medications:     Scheduled Medications: . aspirin  81 mg Oral Daily  . aspirin  300 mg Rectal Once  . chlorhexidine gluconate (MEDLINE KIT)  15 mL Mouth Rinse BID  . Chlorhexidine Gluconate Cloth  6 each Topical Daily  . fentaNYL (SUBLIMAZE) injection  25 mcg Intravenous Once  . heparin  5,000 Units Subcutaneous Q8H  . mouth rinse  15 mL Mouth Rinse 10 times per day  . midazolam      . rosuvastatin  40 mg Oral Daily  . sodium chloride flush  10-40 mL Intracatheter Q12H  . sodium chloride flush  3 mL Intravenous Q12H  . ticagrelor  90 mg Oral BID     Infusions: . sodium chloride 10 mL/hr at 07/21/20 0657  . sodium chloride    . sodium chloride    . sodium chloride    . sodium chloride 50 mL/hr at 07/21/20 0701  . famotidine (PEPCID) IV    . fentaNYL infusion INTRAVENOUS 100 mcg/hr (07/21/20 0400)  . midazolam 3 mg/hr (07/21/20 0400)  . milrinone 0.25 mcg/kg/min (07/21/20 0537)  . norepinephrine (LEVOPHED) Adult infusion 20 mcg/min (07/21/20 0432)  . phenylephrine (NEO-SYNEPHRINE) Adult infusion 100 mcg/min (07/21/20 0353)  . piperacillin-tazobactam (ZOSYN)  IV 3.375 g (07/21/20 0707)  . sodium bicarbonate 150 mEq in D5W infusion 125 mL/hr at 07/21/20 0445     PRN Medications:  sodium chloride, acetaminophen, fentaNYL, midazolam, ondansetron (ZOFRAN) IV, sodium chloride flush, sodium chloride flush    Assessment/Plan   1. CAD: Anterior  STEMI with occluded proximal LAD.  Treated with DES but complicated by embolization to distal LAD (procedure completed with occluded distasl LAD).   He has residual anterior STE on ECG, less than initially. - Continue ticagrelor and ASA 81.  - Crestor 40 daily. 2. Cardiogenic shock: Ischemic cardiomyopathy. Currently on NS 50, HCO3 125,  NE 20, milrinone 0.25, phenylephrine 100. Has femoral CVL and no arterial line.  Lactate 3.7 early am.  CI 1.73 on initial RHC.   - Placing IJ CVL to follow CVP and co-ox, send co-ox after placement.  Can then remove femoral line.  - Needs arterial line, RT to attempt.  - Follow lactate.  - Stop NS, decrease HCO3 to 50 cc/hr (HCO3 32 this morning, can probably stop).  - Continue milrinone 0.25.  - Wean phenylephrine to off, can increase NE or add vasopressin if needed.  - Echo this morning.  3. Acute hypoxemic respiratory failure: Aspiration PNA (lower lobe opacities on CXR) + pulmonary edema.  - On Zosyn per CCM.  - Vent per CCM.  4. Cardiac arrest: VF arrest associated with STEMI.  Patient shocked x 5, 15 min CPR before ROSC.  No further VT.  - Hypothermia protocol per CCM.  5. Neuro: Per notes, was purposefully moving prior to cath, but this morning appear to have myoclonus.   - Will need EEG - eventual head CT.   CRITICAL CARE Performed by: Loralie Champagne  Total critical care time: 40 minutes  Critical care time was exclusive of separately billable procedures and treating other patients.  Critical care was necessary to treat or prevent imminent or life-threatening deterioration.  Critical care was time spent personally by me on the following activities: development of treatment plan with patient and/or surrogate as well as nursing, discussions with consultants, evaluation of patient's response to treatment, examination of patient, obtaining history from patient or surrogate,  ordering and performing treatments and interventions, ordering and review of  laboratory studies, ordering and review of radiographic studies, pulse oximetry and re-evaluation of patient's condition.  Loralie Champagne 07/21/2020 7:48 AM   Length of Stay: 0  Loralie Champagne, MD  07/21/2020, 7:34 AM  Advanced Heart Failure Team Pager [phone] (M-F; 7a - 5p)  Please contact [facility] for night-coverage after hours (5p -7a ) and weekends on [email or web address]

## 2020-07-21 NOTE — Progress Notes (Signed)
Patient was transported from cath lab via the ventilator. Patient projectile vomited around ETT in route. Patient oral airway suctioned and receiving RN made aware.

## 2020-07-21 NOTE — Plan of Care (Signed)

## 2020-07-21 NOTE — ED Notes (Signed)
Dr Jordan at bedside. 

## 2020-07-21 NOTE — Plan of Care (Signed)
  Problem: Education: Goal: Knowledge of General Education information will improve Description: Including pain rating scale, medication(s)/side effects and non-pharmacologic comfort measures Outcome: Progressing   Problem: Health Behavior/Discharge Planning: Goal: Ability to manage health-related needs will improve Outcome: Progressing   Problem: Clinical Measurements: Goal: Ability to maintain clinical measurements within normal limits will improve Outcome: Progressing Goal: Will remain free from infection Outcome: Progressing Goal: Diagnostic test results will improve Outcome: Progressing Goal: Respiratory complications will improve Outcome: Progressing Goal: Cardiovascular complication will be avoided Outcome: Progressing   Problem: Activity: Goal: Risk for activity intolerance will decrease Outcome: Progressing   Problem: Safety: Goal: Ability to remain free from injury will improve Outcome: Progressing   Problem: Skin Integrity: Goal: Risk for impaired skin integrity will decrease Outcome: Progressing   Problem: Elimination: Goal: Will not experience complications related to bowel motility Outcome: Progressing Goal: Will not experience complications related to urinary retention Outcome: Progressing   Problem: Pain Managment: Goal: General experience of comfort will improve Outcome: Progressing

## 2020-07-21 NOTE — ED Provider Notes (Signed)
[facility] DEPT [facility] Emergency Department Provider Note MRN:  [id number]  Arrival date & time: 07/21/20     Chief Complaint   Cardiac Arrest   History of Present Illness   Vincent Horton is a 65 y.o. year-old male with unknown past medical history presenting to the ED with chief complaint of cardiac.  Found down at home, EMS called, pulseless, CPR initiated.  I was unable to obtain an accurate HPI, PMH, or ROS due to the patient's unresponsiveness.  Level 5 caveat.  Review of Systems  Unresponsiveness, cardiac arrest.  Patient's Health History   No past medical history on file.    No family history on file.  Social History   Socioeconomic History  . Marital status: Legally Separated    Spouse name: Not on file  . Number of children: Not on file  . Years of education: Not on file  . Highest education level: Not on file  Occupational History  . Not on file  Tobacco Use  . Smoking status: Not on file  . Smokeless tobacco: Not on file  Substance and Sexual Activity  . Alcohol use: Not on file  . Drug use: Not on file  . Sexual activity: Not on file  Other Topics Concern  . Not on file  Social History Narrative  . Not on file   Social Determinants of Health   Financial Resource Strain: Not on file  Food Insecurity: Not on file  Transportation Needs: Not on file  Physical Activity: Not on file  Stress: Not on file  Social Connections: Not on file  Intimate Partner Violence: Not on file     Physical Exam   Vitals:   07/21/20 0112 07/21/20 0137  BP: 99/75   Pulse: 87   Resp: 18   Temp: (!) 96.3 F (35.7 C)   SpO2: 100% 100%    CONSTITUTIONAL: Ill-appearing NEURO: Minimally responsive, biting and gagging on ET tube EYES:  eyes equal and reactive ENT/NECK:  no LAD, no JVD CARDIO: Regular rate, well-perfused, normal S1 and S2 PULM:  CTAB no wheezing or rhonchi GI/GU:  normal bowel sounds, non-distended, non-tender MSK/SPINE:  No gross  deformities, no edema SKIN:  no rash, atraumatic PSYCH: Unable to assess  *Additional and/or pertinent findings included in MDM below  Diagnostic and Interventional Summary    EKG Interpretation  Date/Time:  Saturday Jul 21 2020 01:09:29 EDT Ventricular Rate:  88 PR Interval:  181 QRS Duration: 80 QT Interval:  381 QTC Calculation: 461 R Axis:   75 Text Interpretation: Sinus rhythm Atrial premature complexes Consider left atrial enlargement Nonspecific T abnrm, anterolateral leads Confirmed by Kennis Carina [phone]) on 07/21/2020 1:36:30 AM      Labs Reviewed  HEMOGLOBIN A1C - Abnormal; Notable for the following components:      Result Value   Hgb A1c MFr Bld 6.5 (*)    All other components within normal limits  CBC WITH DIFFERENTIAL/PLATELET - Abnormal; Notable for the following components:   WBC 13.2 (*)    Lymphs Abs 5.6 (*)    Abs Immature Granulocytes 0.18 (*)    All other components within normal limits  RESP PANEL BY RT-PCR (FLU A&B, COVID) [id number]  COMPREHENSIVE METABOLIC PANEL  LIPID PANEL  PROTIME-INR  APTT  TROPONIN I (HIGH SENSITIVITY)    DG Chest Port 1 View  Final Result      Medications  0.9 %  sodium chloride infusion (has no administration in time range)  midazolam (VERSED) 2  MG/2ML injection (has no administration in time range)  aspirin suppository 300 mg ( Rectal Automatically Held 07/21/20 0130)  midazolam (VERSED) 50 mg/50 mL (1 mg/mL) premix infusion (1 mg/hr Intravenous New Bag/Given 07/21/20 0134)  midazolam (VERSED) bolus via infusion 0-5 mg (1 mg Intravenous Bolus from Bag 07/21/20 0132)  fentaNYL (SUBLIMAZE) injection 25 mcg ( Intravenous Automatically Held 07/21/20 0130)  fentaNYL in NS (35mcg/ml) infusion-PREMIX (50 mcg/hr Intravenous New Bag/Given 07/21/20 0136)  fentaNYL (SUBLIMAZE) bolus via infusion 25-100 mcg (50 mcg Intravenous Bolus from Bag 07/21/20 0133)  heparin injection 60 Units/kg (5,000 Units Intravenous Given 07/21/20  0119)     Procedures  /  Critical Care .Critical Care Performed by: Sabas Sous, MD Authorized by: Sabas Sous, MD   Critical care provider statement:    Critical care time (minutes):  45   Critical care was necessary to treat or prevent imminent or life-threatening deterioration of the following conditions: Cardiac arrest, STEMI.   Critical care was time spent personally by me on the following activities:  Discussions with consultants, evaluation of patient's response to treatment, examination of patient, ordering and performing treatments and interventions, ordering and review of laboratory studies, ordering and review of radiographic studies, pulse oximetry, re-evaluation of patient's condition, obtaining history from patient or surrogate and review of old charts    ED Course and Medical Decision Making  I have reviewed the triage vital signs, the nursing notes, and pertinent available records from the EMR.  Listed above are laboratory and imaging tests that I personally ordered, reviewed, and interpreted and then considered in my medical decision making (see below for details).  CPR for 15 minutes in the field, shocked x5 course of V. fib.  ROSC achieved.  EKG after ROSC revealing STEMI, code STEMI initiated prior to arrival.  Patient required epi drip in the field initially but then discontinued with better pressures.  Required 100 fentanyl and 5 midazolam for sedation, fighting the ET tube that was placed in the field.  On arrival here hemodynamically stable, strong peripheral pulses, well-perfused, ET tube secured.  EKG here in the ED improved.  Pressure is a bit soft, starting liter of fluids.  Cardiology at bedside and management discussed, provided with aspirin, heparin, going to Cath Lab.       [name], MD [facility] Emergency Medicine [facility] [email or web address]  Final Clinical Impressions(s) / ED Diagnoses     ICD-10-CM   1. Cardiac  arrest [facility])  I46.9     ED Discharge Orders    None       Discharge Instructions Discussed with and Provided to Patient:   Discharge Instructions   None       Sabas Sous, MD 07/21/20 0139

## 2020-07-21 NOTE — Consult Note (Deleted)
Cardiology Consultation:   Patient ID: Vincent Horton MRN: [id number]; DOB: 11-Sep-1955  Admit date: 07/21/2020 Date of Consult: 07/21/2020  PCP:  No primary care provider on file.   [facility] Providers Cardiologist:  None        Patient Profile:   Vincent Horton is a 65 y.o. male with unknown medical history who is being seen 07/21/2020 for the evaluation of STEMI  History of Present Illness:   Limited medical history was obtained from medical stuff. Vincent Horton was found by the family unresponsive in the bed who was last seen well approximately one hour ago. Per family, patient complaint some chest pain yesterday. Upon EMS arrival, and apneic. Reportedly patient was in VFib and shocked 5 times, 3 epi, 450mg  amiodarone and 15-min CPR before ROSC achieved. Patient was intubated on site and transferred here. Upon arrival, Dr. and reviewed his EKGs. Initial EKG showed STE V3-V6, repeated EKG showed STE resolved. Patient was agitated and appears neurologically responsive.  No past medical history on file.   Home Medications:  Prior to Admission medications   Not on File    Inpatient Medications: Scheduled Meds: . [MAR Hold] aspirin  300 mg Rectal Once  . [MAR Hold] fentaNYL (SUBLIMAZE) injection  25 mcg Intravenous Once  . midazolam       Continuous Infusions: . sodium chloride    . fentaNYL infusion INTRAVENOUS 50 mcg/hr (07/21/20 0136)  . midazolam 1 mg/hr (07/21/20 0134)   PRN Meds: 07/23/20 Hold] fentaNYL, Heparin (Porcine) in NaCl, lidocaine (PF), [MAR Hold] midazolam, Radial Cocktail/Verapamil only  Allergies:   Not on File  Social History:   Social History   Socioeconomic History  . Marital status: Legally Separated    Spouse name: Not on file  . Number of children: Not on file  . Years of education: Not on file  . Highest education level: Not on file  Occupational History  . Not on file  Tobacco Use  . Smoking status: Not on file  . Smokeless tobacco: Not on  file  Substance and Sexual Activity  . Alcohol use: Not on file  . Drug use: Not on file  . Sexual activity: Not on file  Other Topics Concern  . Not on file  Social History Narrative  . Not on file   Social Determinants of Health   Financial Resource Strain: Not on file  Food Insecurity: Not on file  Transportation Needs: Not on file  Physical Activity: Not on file  Stress: Not on file  Social Connections: Not on file  Intimate Partner Violence: Not on file    Family History:   No family history on file.   ROS:  Please see the history of present illness.   All other ROS reviewed and negative.     Physical Exam/Data:   Vitals:   07/21/20 0112 07/21/20 0137  BP: 99/75   Pulse: 87   Resp: 18   Temp: (!) 96.3 F (35.7 C)   TempSrc: Temporal   SpO2: 100% 100%   No intake or output data in the 24 hours ending 07/21/20 0150 No flowsheet data found.   There is no height or weight on file to calculate BMI.  General:  Intubated and sedated HEENT: pupil size 55mm responsive to light Lymph: no adenopathy Neck: no JVD Endocrine:  No thryomegaly Vascular: No carotid bruits; FA pulses 2+ bilaterally without bruits  Cardiac:  normal S1, S2; RRR; no murmur  Lungs:  Mechanical sounds Abd: soft, non-distension,  no hepatomegaly  Ext: no edema Musculoskeletal:  No deformities, appears moves his extremities freely Skin: dry and cool Neuro:  Unable to assess   Relevant CV Studies:   Laboratory Data:  High Sensitivity Troponin:  No results for input(s): TROPONINIHS in the last 720 hours.   ChemistryNo results for input(s): NA, K, CL, CO2, GLUCOSE, BUN, CREATININE, CALCIUM, GFRNONAA, GFRAA, ANIONGAP in the last 168 hours.  No results for input(s): PROT, ALBUMIN, AST, ALT, ALKPHOS, BILITOT in the last 168 hours. Hematology Recent Labs  Lab 07/21/20 0109  WBC 13.2*  RBC 4.36  HGB 13.8  HCT 42.3  MCV 97.0  MCH 31.7  MCHC 32.6  RDW 13.7  PLT 216   BNPNo results for  input(s): BNP, PROBNP in the last 168 hours.  DDimer No results for input(s): DDIMER in the last 168 hours.   Radiology/Studies:  DG Chest Port 1 View  Result Date: 07/21/2020 CLINICAL DATA:  STEMI. Found unresponsive by family. Pulseless and apneic. Initially in vfib, total of 5 shocks, 3 epis, 450mg  amio and 15 minutes CPR. Pt agitated on arrival, given 2mg  versed IV. EXAM: PORTABLE CHEST 1 VIEW COMPARISON:  Chest x-ray 02/22/2006 FINDINGS: Interval placement of an endotracheal tube with tip terminating 4 cm above the carina. Cardiac paddles overlie the patient. Poorly nonvisualization of the aortic arch due to overlying pulmonary findings. Otherwise the heart size and mediastinal contours are within normal limits. Prominence of the hilar vasculature. Perihilar interstitial and airspace opacities. Increased interstitial markings diffusely. Question of Kerley B lines within the left peripheral lower lobe. No pulmonary edema. No pleural effusion. No pneumothorax. No acute osseous abnormality. IMPRESSION: 1. Pulmonary edema. Superimposed infection/inflammation not excluded. 2. Endotracheal tube with tip 4 cm above the carina. Electronically Signed   By: M.D.   On: 07/21/2020 01:32     Assessment and Plan:   #S/p cardiac arrest #Reportedly VFib requiring  #STEMI s/p DES to pLAD -continue Telemetry -EKG prn -risk stratification with A1c, TSH and lipid panel -ASA and high-intensity statin -continue DAPT -acquire a TTE am  #low cardiac output (CO 2.73, CI 1.73) -PAC -trend lactic acid, coox, LFT and renal Fx -inotropic with milrinone drip  Risk Assessment/Risk Scores:     TIMI Risk Score for ST  Elevation MI:   The patient's TIMI risk score is 8, which indicates a 26.8% risk of all cause mortality at 30 days.     For questions or updates, please contact [facility] Please consult [email or web address] for contact info under    Signed, Tish Frederickson, MD  07/21/2020 1:50 AM

## 2020-07-21 NOTE — Procedures (Signed)
Patient Name: Vincent Horton  MRN: [id number]  Epilepsy Attending: Charlsie Quest  Referring Physician/Provider: Dr Emelda Brothers Date: 07/21/2020  Duration: 25.23 mins  Patient history: 65yo M s/p cardiac arrest. EEG to evaluate for seizure.  Level of alertness: comatose  AEDs during EEG study: Versed  Technical aspects: This EEG study was done with scalp electrodes positioned according to the 10-20 International system of electrode placement. Electrical activity was acquired at a sampling rate of 500Hz  and reviewed with a high frequency filter of 70Hz  and a low frequency filter of 1Hz . EEG data were recorded continuously and digitally stored.   Description: EEG showed continuous generalized low amplitude 3 to 6 Hz theta-delta slowing. EEG was reactive to tactile stimulation. Hyperventilation and photic stimulation were not performed.     ABNORMALITY - Continuous slow, generalized  IMPRESSION: This study is suggestive of severe diffuse encephalopathy, nonspecific etiology. No seizures or epileptiform discharges were seen throughout the recording.  Huldah Marin 

## 2020-07-21 NOTE — H&P (Signed)
Cardiology Admission History and Physical:   Patient ID: Chritopher Horton MRN: [id number]; DOB: 1955/07/24  Admit date: 07/21/2020 Date of Consult: 07/21/2020  PCP:  No primary care provider on file.              [facility] Providers Cardiologist:  None        Patient Profile:   Vincent Horton is a 65 y.o. male with unknown medical history who is being seen 07/21/2020 for the evaluation of STEMI  History of Present Illness:   Limited medical history was obtained from medical stuff. Mr. Grigorian was found by the family unresponsive in the bed who was last seen well approximately one hour ago. Per family, patient complaint some chest pain yesterday. Upon EMS arrival, and apneic. Reportedly patient was in VFib and shocked 5 times, 3 epi, 450mg  amiodarone and 15-min CPR before ROSC achieved. Patient was intubated on site and transferred here. Upon arrival, Dr. and reviewed his EKGs. Initial EKG showed STE V3-V6, repeated EKG showed STE resolved. Patient was agitated and appears neurologically responsive.  No past medical history on file.   Home Medications:  Prior to Admission medications   Not on File    Inpatient Medications: Scheduled Meds: . [MAR Hold] aspirin  300 mg Rectal Once  . [MAR Hold] fentaNYL (SUBLIMAZE) injection  25 mcg Intravenous Once  . midazolam       Continuous Infusions: . sodium chloride    . fentaNYL infusion INTRAVENOUS 50 mcg/hr (07/21/20 0136)  . midazolam 1 mg/hr (07/21/20 0134)   PRN Meds: 07/23/20 Hold] fentaNYL, Heparin (Porcine) in NaCl, lidocaine (PF), [MAR Hold] midazolam, Radial Cocktail/Verapamil only  Allergies:   Not on File  Social History:   Social History   Socioeconomic History  . Marital status: Legally Separated    Spouse name: Not on file  . Number of children: Not on file  . Years of education: Not on file  . Highest education level: Not on file  Occupational History  . Not on file  Tobacco Use  . Smoking  status: Not on file  . Smokeless tobacco: Not on file  Substance and Sexual Activity  . Alcohol use: Not on file  . Drug use: Not on file  . Sexual activity: Not on file  Other Topics Concern  . Not on file  Social History Narrative  . Not on file   Social Determinants of Health   Financial Resource Strain: Not on file  Food Insecurity: Not on file  Transportation Needs: Not on file  Physical Activity: Not on file  Stress: Not on file  Social Connections: Not on file  Intimate Partner Violence: Not on file    Family History:   No family history on file.   ROS:  Please see the history of present illness.   All other ROS reviewed and negative.     Physical Exam/Data:       Vitals:   07/21/20 0112 07/21/20 0137  BP: 99/75   Pulse: 87   Resp: 18   Temp: (!) 96.3 F (35.7 C)   TempSrc: Temporal   SpO2: 100% 100%   No intake or output data in the 24 hours ending 07/21/20 0150 No flowsheet data found.   There is no height or weight on file to calculate BMI.  General:  Intubated and sedated HEENT: pupil size 43mm responsive to light Lymph: no adenopathy Neck: no JVD Endocrine:  No thryomegaly Vascular: No carotid bruits; FA pulses 2+ bilaterally without  bruits  Cardiac:  normal S1, S2; RRR; no murmur  Lungs:  Mechanical sounds Abd: soft, non-distension, no hepatomegaly  Ext: no edema Musculoskeletal:  No deformities, appears moves his extremities freely Skin: dry and cool Neuro:  Unable to assess   Relevant CV Studies:   Laboratory Data:  High Sensitivity Troponin:   Last Labs   No results for input(s): TROPONINIHS in the last 720 hours.     Chemistry Last Labs   No results for input(s): NA, K, CL, CO2, GLUCOSE, BUN, CREATININE, CALCIUM, GFRNONAA, GFRAA, ANIONGAP in the last 168 hours.    Last Labs   No results for input(s): PROT, ALBUMIN, AST, ALT, ALKPHOS, BILITOT in the last 168 hours.   Hematology Last Labs      Recent Labs   Lab 07/21/20 0109  WBC 13.2*  RBC 4.36  HGB 13.8  HCT 42.3  MCV 97.0  MCH 31.7  MCHC 32.6  RDW 13.7  PLT 216     BNP Last Labs   No results for input(s): BNP, PROBNP in the last 168 hours.    DDimer  Last Labs   No results for input(s): DDIMER in the last 168 hours.     Radiology/Studies:  DG Chest Port 1 View  Result Date: 07/21/2020 CLINICAL DATA:  STEMI. Found unresponsive by family. Pulseless and apneic. Initially in vfib, total of 5 shocks, 3 epis, 450mg  amio and 15 minutes CPR. Pt agitated on arrival, given 2mg  versed IV. EXAM: PORTABLE CHEST 1 VIEW COMPARISON:  Chest x-ray 02/22/2006 FINDINGS: Interval placement of an endotracheal tube with tip terminating 4 cm above the carina. Cardiac paddles overlie the patient. Poorly nonvisualization of the aortic arch due to overlying pulmonary findings. Otherwise the heart size and mediastinal contours are within normal limits. Prominence of the hilar vasculature. Perihilar interstitial and airspace opacities. Increased interstitial markings diffusely. Question of Kerley B lines within the left peripheral lower lobe. No pulmonary edema. No pleural effusion. No pneumothorax. No acute osseous abnormality. IMPRESSION: 1. Pulmonary edema. Superimposed infection/inflammation not excluded. 2. Endotracheal tube with tip 4 cm above the carina. Electronically Signed   By: M.D.   On: 07/21/2020 01:32     Assessment and Plan:   #S/p cardiac arrest #Reportedly VFib requiring  #STEMI s/p DES to pLAD -continue Telemetry -EKG prn -risk stratification with A1c, TSH and lipid panel -ASA and high-intensity statin -continue DAPT -acquire a TTE am  #low cardiac output (CO 2.73, CI 1.73) -PAC -trend lactic acid, coox, LFT and renal Fx -inotropic with milrinone drip  Risk Assessment/Risk Scores:     TIMI Risk Score for ST  Elevation MI:   The patient's TIMI risk score is 8, which indicates a 26.8% risk of all  cause mortality at 30 days.     For questions or updates, please contact [facility] Please consult [email or web address] for contact info under    Signed, Tish Frederickson, MD  07/21/2020 1:50 AM

## 2020-07-21 NOTE — Progress Notes (Signed)
Patient transported to cath lab via the ventilator with no complications.

## 2020-07-22 ENCOUNTER — Inpatient Hospital Stay (HOSPITAL_COMMUNITY): Payer: Medicare Other

## 2020-07-22 DIAGNOSIS — R570 Cardiogenic shock: Secondary | ICD-10-CM | POA: Diagnosis not present

## 2020-07-22 DIAGNOSIS — I469 Cardiac arrest, cause unspecified: Secondary | ICD-10-CM | POA: Diagnosis not present

## 2020-07-22 DIAGNOSIS — I2102 ST elevation (STEMI) myocardial infarction involving left anterior descending coronary artery: Secondary | ICD-10-CM | POA: Diagnosis not present

## 2020-07-22 LAB — COMPREHENSIVE METABOLIC PANEL
ALT: 166 U/L — ABNORMAL HIGH (ref 0–44)
ALT: 142 U/L — ABNORMAL HIGH (ref 0–44)
AST: 223 U/L — ABNORMAL HIGH (ref 15–41)
AST: 232 U/L — ABNORMAL HIGH (ref 15–41)
Albumin: 2.6 g/dL — ABNORMAL LOW (ref 3.5–5.0)
Albumin: 2.2 g/dL — ABNORMAL LOW (ref 3.5–5.0)
Alkaline Phosphatase: 66 U/L (ref 38–126)
Alkaline Phosphatase: 58 U/L (ref 38–126)
Anion gap: 10 (ref 5–15)
Anion gap: 8 (ref 5–15)
BUN: 31 mg/dL — ABNORMAL HIGH (ref 8–23)
BUN: 36 mg/dL — ABNORMAL HIGH (ref 8–23)
CO2: 31 mmol/L (ref 22–32)
CO2: 29 mmol/L (ref 22–32)
Calcium: 6.7 mg/dL — ABNORMAL LOW (ref 8.9–10.3)
Calcium: 6.1 mg/dL — CL (ref 8.9–10.3)
Chloride: 101 mmol/L (ref 98–111)
Chloride: 102 mmol/L (ref 98–111)
Creatinine, Ser: 1.27 mg/dL — ABNORMAL HIGH (ref 0.61–1.24)
Creatinine, Ser: 1.31 mg/dL — ABNORMAL HIGH (ref 0.61–1.24)
GFR, Estimated: >60 mL/min (ref >60)
GFR, Estimated: >60 mL/min (ref >60)
Glucose, Bld: 81 mg/dL (ref 70–99)
Glucose, Bld: 238 mg/dL — ABNORMAL HIGH (ref 70–99)
Potassium: 3.8 mmol/L (ref 3.5–5.1)
Potassium: 4.3 mmol/L (ref 3.5–5.1)
Sodium: 142 mmol/L (ref 135–145)
Sodium: 139 mmol/L (ref 135–145)
Total Bilirubin: 0.7 mg/dL (ref 0.3–1.2)
Total Bilirubin: 0.8 mg/dL (ref 0.3–1.2)
Total Protein: 4.9 g/dL — ABNORMAL LOW (ref 6.5–8.1)
Total Protein: 4.5 g/dL — ABNORMAL LOW (ref 6.5–8.1)

## 2020-07-22 LAB — POCT I-STAT 7, (LYTES, BLD GAS, ICA,H+H)
Acid-Base Excess: 5 mmol/L — ABNORMAL HIGH (ref 0.0–2.0)
Bicarbonate: 30.2 mmol/L — ABNORMAL HIGH (ref 20.0–28.0)
Calcium, Ion: 0.89 mmol/L — CL (ref 1.15–1.40)
HCT: 25 % — ABNORMAL LOW (ref 39.0–52.0)
Hemoglobin: 8.5 g/dL — ABNORMAL LOW (ref 13.0–17.0)
O2 Saturation: 89 %
Patient temperature: 36.8
Potassium: 4.3 mmol/L (ref 3.5–5.1)
Sodium: 140 mmol/L (ref 135–145)
TCO2: 32 mmol/L (ref 22–32)
pCO2 arterial: 47.4 mmHg (ref 32.0–48.0)
pH, Arterial: 7.411 (ref 7.350–7.450)
pO2, Arterial: 56 mmHg — ABNORMAL LOW (ref 83.0–108.0)

## 2020-07-22 LAB — GLUCOSE, CAPILLARY
Glucose-Capillary: 106 mg/dL — ABNORMAL HIGH (ref 70–99)
Glucose-Capillary: 109 mg/dL — ABNORMAL HIGH (ref 70–99)
Glucose-Capillary: 122 mg/dL — ABNORMAL HIGH (ref 70–99)
Glucose-Capillary: 124 mg/dL — ABNORMAL HIGH (ref 70–99)
Glucose-Capillary: 146 mg/dL — ABNORMAL HIGH (ref 70–99)
Glucose-Capillary: 227 mg/dL — ABNORMAL HIGH (ref 70–99)
Glucose-Capillary: 62 mg/dL — ABNORMAL LOW (ref 70–99)
Glucose-Capillary: 70 mg/dL (ref 70–99)

## 2020-07-22 LAB — CBC
HCT: 31.1 % — ABNORMAL LOW (ref 39.0–52.0)
HCT: 25.6 % — ABNORMAL LOW (ref 39.0–52.0)
Hemoglobin: 10.2 g/dL — ABNORMAL LOW (ref 13.0–17.0)
Hemoglobin: 8.5 g/dL — ABNORMAL LOW (ref 13.0–17.0)
MCH: 31.1 pg (ref 26.0–34.0)
MCH: 31 pg (ref 26.0–34.0)
MCHC: 32.8 g/dL (ref 30.0–36.0)
MCHC: 33.2 g/dL (ref 30.0–36.0)
MCV: 94.8 fL (ref 80.0–100.0)
MCV: 93.4 fL (ref 80.0–100.0)
Platelets: 208 10*3/uL (ref 150–400)
Platelets: 206 10*3/uL (ref 150–400)
RBC: 3.28 MIL/uL — ABNORMAL LOW (ref 4.22–5.81)
RBC: 2.74 MIL/uL — ABNORMAL LOW (ref 4.22–5.81)
RDW: 13.8 % (ref 11.5–15.5)
RDW: 14 % (ref 11.5–15.5)
WBC: 28.6 10*3/uL — ABNORMAL HIGH (ref 4.0–10.5)
WBC: 24.8 10*3/uL — ABNORMAL HIGH (ref 4.0–10.5)
nRBC: 0 % (ref 0.0–0.2)
nRBC: 0 % (ref 0.0–0.2)

## 2020-07-22 LAB — COOXEMETRY PANEL
Carboxyhemoglobin: 0.6 % (ref 0.5–1.5)
Carboxyhemoglobin: 0.4 % — ABNORMAL LOW (ref 0.5–1.5)
Carboxyhemoglobin: 0.5 % (ref 0.5–1.5)
Methemoglobin: 1.5 % (ref 0.0–1.5)
Methemoglobin: 1.1 % (ref 0.0–1.5)
Methemoglobin: 1.3 % (ref 0.0–1.5)
O2 Saturation: 60.2 %
O2 Saturation: 47.6 %
O2 Saturation: 53.8 %
Total hemoglobin: 9.7 g/dL — ABNORMAL LOW (ref 12.0–16.0)
Total hemoglobin: 7.2 g/dL — ABNORMAL LOW (ref 12.0–16.0)
Total hemoglobin: 8.3 g/dL — ABNORMAL LOW (ref 12.0–16.0)

## 2020-07-22 LAB — LACTIC ACID, PLASMA
Lactic Acid, Venous: 2.6 mmol/L (ref 0.5–1.9)
Lactic Acid, Venous: 2.7 mmol/L (ref 0.5–1.9)
Lactic Acid, Venous: 2.7 mmol/L (ref 0.5–1.9)

## 2020-07-22 MED ORDER — DEXTROSE 10 % IV SOLN: INTRAVENOUS | Status: DC

## 2020-07-22 MED ORDER — DEXTROSE 50 % IV SOLN
12.5000 g | INTRAVENOUS | Status: AC
  Administered 2020-07-22: 12.5 g via INTRAVENOUS

## 2020-07-22 MED ORDER — POTASSIUM CHLORIDE 10 MEQ/50ML IV SOLN
10.0000 meq | INTRAVENOUS | Status: AC
  Administered 2020-07-22 (×2): 10 meq via INTRAVENOUS
  Filled 2020-07-22 (×2): qty 50

## 2020-07-22 MED ORDER — DEXMEDETOMIDINE HCL IN NACL 400 MCG/100ML IV SOLN
0.4000 ug/kg/h | INTRAVENOUS | Status: DC
  Administered 2020-07-22: 0.4 ug/kg/h via INTRAVENOUS
  Administered 2020-07-23: 0.7 ug/kg/h via INTRAVENOUS
  Administered 2020-07-23 (×2): 1 ug/kg/h via INTRAVENOUS
  Administered 2020-07-24 – 2020-07-25 (×5): 1.2 ug/kg/h via INTRAVENOUS
  Administered 2020-07-25 (×2): 1.4 ug/kg/h via INTRAVENOUS
  Administered 2020-07-25: 1.5 ug/kg/h via INTRAVENOUS
  Administered 2020-07-25 – 2020-07-28 (×8): 1.6 ug/kg/h via INTRAVENOUS
  Administered 2020-07-28: 1.4 ug/kg/h via INTRAVENOUS
  Administered 2020-07-29: 1.6 ug/kg/h via INTRAVENOUS
  Administered 2020-07-29 (×2): 1.4 ug/kg/h via INTRAVENOUS
  Administered 2020-07-29 – 2020-07-30 (×2): 1.6 ug/kg/h via INTRAVENOUS
  Administered 2020-07-30 (×2): 1 ug/kg/h via INTRAVENOUS
  Administered 2020-07-31: 0.1 ug/kg/h via INTRAVENOUS
  Administered 2020-07-31: 1 ug/kg/h via INTRAVENOUS
  Administered 2020-08-02: 0.4 ug/kg/h via INTRAVENOUS
  Filled 2020-07-22 (×39): qty 100

## 2020-07-22 MED ORDER — EPINEPHRINE HCL 5 MG/250ML IV SOLN IN NS: 0.5000 ug/min | INTRAVENOUS | Status: DC

## 2020-07-22 MED ORDER — MIDAZOLAM HCL 2 MG/2ML IJ SOLN
2.0000 mg | INTRAMUSCULAR | Status: DC | PRN
  Administered 2020-07-22 – 2020-07-25 (×12): 2 mg via INTRAVENOUS
  Filled 2020-07-22 (×12): qty 2

## 2020-07-22 MED ORDER — AMIODARONE HCL IN DEXTROSE 360-4.14 MG/200ML-% IV SOLN
30.0000 mg/h | INTRAVENOUS | Status: DC
  Administered 2020-07-23 – 2020-07-25 (×5): 30 mg/h via INTRAVENOUS
  Filled 2020-07-22 (×6): qty 200

## 2020-07-22 MED ORDER — AMIODARONE HCL IN DEXTROSE 360-4.14 MG/200ML-% IV SOLN
60.0000 mg/h | INTRAVENOUS | Status: AC
  Administered 2020-07-22 (×2): 60 mg/h via INTRAVENOUS

## 2020-07-22 MED ORDER — CALCIUM GLUCONATE-NACL 2-0.675 GM/100ML-% IV SOLN
2.0000 g | Freq: Once | INTRAVENOUS | Status: AC
  Administered 2020-07-22: 2000 mg via INTRAVENOUS
  Filled 2020-07-22: qty 100

## 2020-07-22 MED ORDER — FUROSEMIDE 10 MG/ML IJ SOLN
80.0000 mg | Freq: Once | INTRAMUSCULAR | Status: AC
  Administered 2020-07-22: 80 mg via INTRAVENOUS
  Filled 2020-07-22: qty 8

## 2020-07-22 MED ORDER — AMIODARONE LOAD VIA INFUSION
150.0000 mg | Freq: Once | INTRAVENOUS | Status: AC
  Administered 2020-07-22: 150 mg via INTRAVENOUS
  Filled 2020-07-22: qty 83.34

## 2020-07-22 MED ORDER — LACTATED RINGERS IV BOLUS
500.0000 mL | Freq: Once | INTRAVENOUS | Status: AC
  Administered 2020-07-22: 500 mL via INTRAVENOUS

## 2020-07-22 MED ORDER — AMIODARONE HCL IN DEXTROSE 360-4.14 MG/200ML-% IV SOLN
INTRAVENOUS | Status: AC
  Filled 2020-07-22: qty 200

## 2020-07-22 MED ORDER — PHENYLEPHRINE CONCENTRATED 100MG/250ML (0.4 MG/ML) INFUSION SIMPLE
0.0000 ug/min | INTRAVENOUS | Status: DC
  Administered 2020-07-22: 20 ug/min via INTRAVENOUS
  Filled 2020-07-22: qty 250

## 2020-07-22 MED ORDER — VASOPRESSIN 20 UNITS/100 ML INFUSION FOR SHOCK
0.0000 [IU]/min | INTRAVENOUS | Status: DC
  Administered 2020-07-22 – 2020-07-26 (×13): 0.04 [IU]/min via INTRAVENOUS
  Administered 2020-07-27 – 2020-07-29 (×5): 0.03 [IU]/min via INTRAVENOUS
  Administered 2020-07-31: 0.01 [IU]/min via INTRAVENOUS
  Filled 2020-07-22 (×23): qty 100

## 2020-07-22 NOTE — Progress Notes (Signed)
eLink Physician-Brief Progress Note Patient Name: Vincent Horton DOB: [birth year] MRN: [id number]   Date of Service  07/22/2020  HPI/Events of Note  Pt started on D10 @ 30 for persistant hypoglycemia last night. Now corrected with CBG 200's. Requesting to decrease rate t 10-20  eICU Interventions  changed to 10 ml/hr. Call back if BG over 180 x thrice or so.      Intervention Category Intermediate Interventions: Other:  Ranee Gosselin 07/22/2020, 9:03 PM

## 2020-07-22 NOTE — Progress Notes (Addendum)
NAMEItzel Horton, MRN:  [id number], DOB:  1955-03-12, LOS: 1 ADMISSION DATE:  07/21/2020, CONSULTATION DATE:  07/22/2019 REFERRING MD:  [place], CHIEF COMPLAINT:  Post arrest   History of Present Illness:  Patient is a 65 year old male with limited medical history found down by family, EMS was called, he was found to be in V. fib shocked 5 x 3 mg epinephrine 450 mg of amiodarone with ROSC obtained.  He was emergently taken to the Cath Lab showing ST segment changes in anterior lateral leads status post revascularization of LAD.  He was noted to have been moving spontaneously at time of cardiac catheterization.  Transferred to the ICU.  PCCM consulted for help with management  Pertinent  Medical History    has no past medical history on file.  Significant Hospital Events: Including procedures, antibiotic start and stop dates in addition to other pertinent events   5/21 Vfib arrest, Cardiac catheterization, TTM started, right IJ placed  Interim History / Subjective:   Continues on milrinone, Levophed. Unresponsive on Versed and fentanyl  Objective   Blood pressure 91/62, pulse (!) 113, temperature (!) 97.3 F (36.3 C), temperature source Bladder, resp. rate 13, height 5\' 4"  (1.626 m), weight 50.8 kg, SpO2 94 %. CVP:  [9 mmHg-13 mmHg] 9 mmHg  Vent Mode: PRVC FiO2 (%):  [80 %-90 %] 80 % Set Rate:  [22 bmp] 22 bmp Vt Set:  [470 mL] 470 mL PEEP:  [5 cmH20] 5 cmH20 Plateau Pressure:  [18 cmH20-32 cmH20] 32 cmH20   Intake/Output Summary (Last 24 hours) at 07/22/2020 0844 Last data filed at 07/22/2020 0600 Gross per 24 hour  Intake 2323.37 ml  Output 840 ml  Net 1483.37 ml   Filed Weights   07/21/20 0302 07/21/20 0330 07/22/20 0600  Weight: 55 kg 51.4 kg 50.8 kg    Examination: Gen:      No acute distress HEENT:  EOMI, sclera anicteric Neck:     No masses; no thyromegaly, ETT Lungs:    Clear to auscultation bilaterally; normal respiratory effort CV:         Regular rate and rhythm;  no murmurs Abd:      + bowel sounds; soft, non-tender; no palpable masses, no distension Ext:    No edema; adequate peripheral perfusion Skin:      Warm and dry; no rash Neuro: Sedated, unresponsive  Labs/imaging that I havepersonally reviewed  (right click and "Reselect all SmartList Selections" daily)   BUN/creatinine 31/1.27, AST 223, ALT 167 No new imaging  Resolved Hospital Problem list     Assessment & Plan:  Cardiac arrest Status post STEMI post LAD stent, cardiogenic shock Cardiomyopathy EF 30-35 Continue Levophed, milrinone.  Wean as tolerated Lasix for diuresis per cardiology TTM, normothermia protocol  Acute respiratory failure secondary to cardiac arrest, MI Right-sided aspiration pneumonia Continue antibiotics.  Follow cultures Wean down PEEP and FiO2 as tolerated  AKI Monitor urine output and creatinine  Elevated transaminases secondary to shock Trending down.  Follow labs  Acute encephalopathy, metabolic Concern for anoxic injury though he was noted to be spontaneously moving prior to catheterization EEG results noted Wean off sedation and wake up. CT head when stable   Best practice (right click and "Reselect all SmartList Selections" daily)  Diet:  NPO Pain/Anxiety/Delirium protocol (if indicated): Yes (RASS goal -1) VAP protocol (if indicated): Yes DVT prophylaxis: Subcutaneous Heparin and SCD GI prophylaxis: H2B Glucose control:  SSI No Central venous access:  Yes, and it is  still needed Arterial line:  N/A Foley:  Yes, and it is still needed Mobility:  bed rest  PT consulted: N/A Last date of multidisciplinary goals of care discussion NA] Code Status:  full code Disposition: ICU  Critical care time:    The patient is critically ill with multiple organ system failure and requires high complexity decision making for assessment and support, frequent evaluation and titration of therapies, advanced monitoring, review of radiographic studies and  interpretation of complex data.   Critical Care Time devoted to patient care services, exclusive of separately billable procedures, described in this note is 40 minutes.   Chilton Greathouse MD [facility] See Amion for pager  If no response to pager , please call [phone] until 7pm After 7:00 pm call Elink  [phone] 07/22/2020, 8:53 AM

## 2020-07-22 NOTE — Progress Notes (Signed)
Returned from CT scan at this time, RT and transport assisting, monitor attached, VSS, no issues during transport or scan,

## 2020-07-22 NOTE — Plan of Care (Signed)

## 2020-07-22 NOTE — Significant Event (Addendum)
Paged by nursing at 1900 that patient was more tachycardic than prior. Had reached out to Dr. Shirlee Latch already who rec's amio if AF. Difficult to interpret ECGs with substantial artifact but RR intervals are somewhat irregular, likely AF. S/p amio bolus with HR in the 130s. Patient was having increasing pressor requiremetns so I placed a RRA A line, correlated fairly closely to prior BP cuff measurements. At shift change was max on neo (80 mcg), levo (40 mcg) and vaso (0.04 mcg). Obtained 1 BCx from new A line and lactate/CMP/CBC/co-ox. Called daughter to explain decline in status and she understands, still currently remains FULL code. CT head this afternoon without evidence of current anoxic brain injury, all chronic microvascular ischemia. Co-ox now lower (47.6, was in the 60s, was high 40-50 on initial transfer to ICU). Lactate same as earlier today, remains hypoCa. Giving 2 gm Ca gluconate, recheck co-ox now (currently on milrinone 0.25 mcg. If co-ox remains <50 will uptitrate milrinone for more inotropic support. Fortunately coming down off pressors and HR better (100-110).   Now at: Neo 20 mcg (80 mcg at shift change) Levo was at 40 mcg, now at 28 mcg Vaso at 0.04 mcg   Will continue to trend co-ox/lactate and update family.

## 2020-07-22 NOTE — Procedures (Addendum)
Patient Name: Vincent Horton  MRN: [id number]  Epilepsy Attending: Charlsie Quest  Referring Physician/Provider: Dr Emelda Brothers Duration: 07/21/2020 3976 to 07/22/2020 7341  Patient history: 65yo M s/p cardiac arrest. EEG to evaluate for seizure.  Level of alertness: comatose  AEDs during EEG study: Versed  Technical aspects: This EEG study was done with scalp electrodes positioned according to the 10-20 International system of electrode placement. Electrical activity was acquired at a sampling rate of 500Hz  and reviewed with a high frequency filter of 70Hz  and a low frequency filter of 1Hz . EEG data were recorded continuously and digitally stored.   Description: EEG showed continuous generalized low amplitude 3 to 6 Hz theta-delta slowing. EEG was reactive to tactile stimulation. Hyperventilation and photic stimulation were not performed.     Parts of study were difficult evaluate due to significant electrode artifact.  ABNORMALITY - Continuous slow, generalized  IMPRESSION: This study is suggestive of severe diffuse encephalopathy, nonspecific etiology. No seizures or epileptiform discharges were seen throughout the recording.  [name] 

## 2020-07-22 NOTE — Progress Notes (Signed)
Pt HR> 120 sustained, BP decreasing to 80s systolic, Vent wave forms showing stacked breaths frequently, attempting to resolve these issues with increasing Dex and Norepi gtts. MDs made aware via conversation and text,   EEG tech called regarding EEG termination time, CT scheduled for 1530,   Continuing to assess and monitor

## 2020-07-22 NOTE — Progress Notes (Signed)
Patient ID: Vincent Horton, male   DOB: 08/02/1955, 65 y.o.   MRN: [id number] P    Advanced Heart Failure Rounding Note  PCP-Cardiologist: None   Subjective:    Currently on milrinone 0.25, NE 30.  SBP now 90s generally with stable MAP.  He is on HCO3 gtt 50 cc/hr.  Co-ox 60%.  I/Os + with CVP about 9.   He is on hypothermia protocol, to be rewarmed this morning.  EEG with severe diffuse encephalopathy.   Zosyn for possible aspiration PNA.   Echo: EF 30-35%, no LV thrombus, LAD territory WMAs, RV normal, IVC normal.    Objective:   Weight Range: 50.8 kg Body mass index is 19.22 kg/m.   Vital Signs:   Temp:  [95.7 F (35.4 C)-98.1 F (36.7 C)] 97 F (36.1 C) (05/22 0600) Pulse Rate:  [98-122] 113 (05/22 0600) Resp:  [3-27] 13 (05/22 0600) BP: (81-122)/(58-92) 91/62 (05/22 0600) SpO2:  [91 %-98 %] 92 % (05/22 0600) FiO2 (%):  [80 %-90 %] 80 % (05/22 0412) Weight:  [50.8 kg] 50.8 kg (05/22 0600) Last BM Date:  (PTA)  Weight change: Filed Weights   07/21/20 0302 07/21/20 0330 07/22/20 0600  Weight: 55 kg 51.4 kg 50.8 kg    Intake/Output:   Intake/Output Summary (Last 24 hours) at 07/22/2020 0736 Last data filed at 07/22/2020 0600 Gross per 24 hour  Intake 2594.2 ml  Output 905 ml  Net 1689.2 ml      Physical Exam    General: Intubated/sedated Neck: JVP 8 cm, no thyromegaly or thyroid nodule.  Lungs: Crackles at bases. CV: Nondisplaced PMI.  Heart mildly tachy, regular S1/S2, no S3/S4, no murmur.  No peripheral edema.   Abdomen: Soft, nontender, no hepatosplenomegaly, no distention.  Skin: Intact without lesions or rashes.  Neurologic: No purposeful response/no gag reflex Extremities: No clubbing or cyanosis.  HEENT: Normal.    Telemetry   Sinus tachy 100s (personally reviewed)   Labs    CBC Recent Labs    07/21/20 0109 07/21/20 0149 07/21/20 0429 07/21/20 0505  WBC 13.2*  --  16.7*  --   NEUTROABS 6.4  --  13.4*  --   HGB 13.8   < > 9.7* 9.5*   HCT 42.3   < > 29.2* 28.0*  MCV 97.0  --  95.1  --   PLT 216  --  205  --    < > = values in this interval not displayed.   Basic Metabolic Panel Recent Labs    07/21/20 0429 07/21/20 0505 07/22/20 0340  NA 144 146* 142  K 4.0 3.7 3.8  CL 107  --  101  CO2 32  --  31  GLUCOSE 286*  --  81  BUN 15  --  31*  CREATININE 1.28*  --  1.27*  CALCIUM 6.5*  --  6.7*  MG 1.9  --   --    Liver Function Tests Recent Labs    07/21/20 0429 07/22/20 0340  AST 250* 223*  ALT 174* 166*  ALKPHOS 71 66  BILITOT 0.1* 0.7  PROT 3.9* 4.9*  ALBUMIN 2.2* 2.6*   No results for input(s): LIPASE, AMYLASE in the last 72 hours. Cardiac Enzymes No results for input(s): CKTOTAL, CKMB, CKMBINDEX, TROPONINI in the last 72 hours.  BNP: BNP (last 3 results) No results for input(s): BNP in the last 8760 hours.  ProBNP (last 3 results) No results for input(s): PROBNP in the last 8760 hours.  D-Dimer No results for input(s): DDIMER in the last 72 hours. Hemoglobin A1C Recent Labs    07/21/20 0109  HGBA1C 6.5*   Fasting Lipid Panel Recent Labs    07/21/20 0109  CHOL 172  HDL 35*  LDLCALC 108*  TRIG 144  CHOLHDL 4.9   Thyroid Function Tests No results for input(s): TSH, T4TOTAL, T3FREE, THYROIDAB in the last 72 hours.  Invalid input(s): FREET3  Other results:   Imaging    DG CHEST PORT 1 VIEW  Result Date: 07/21/2020 CLINICAL DATA:  Central line placement. EXAM: PORTABLE CHEST 1 VIEW COMPARISON:  Jul 21, 2020 FINDINGS: A right central line is been placed in the interval terminating in the central SVC. No pneumothorax. The ETT remains in good position terminating 2 cm above the carina. An NG tube terminates below today's film. Opacity in left retrocardiac region is stable. Haziness over the right hemithorax is stable to mildly worsened. Mild interstitial prominence on the left. IMPRESSION: 1. A new right central line is in good position.  No pneumothorax. 2. Hazy opacity  diffusely on the right is mildly worse in the interval. I suspect a layering effusion with underlying opacity. Asymmetric edema considered less likely but possible. 3. Mild interstitial prominence on the left may represent pulmonary venous congestion. 4. Persistent opacity in left retrocardiac region. 5. Support apparatus as above. Electronically Signed   By: Dorise Bullion III M.D   On: 07/21/2020 09:26   EEG adult  Result Date: 07/21/2020 Lora Havens, MD     07/21/2020  9:51 AM Patient Name: Vincent Horton MRN: [id number] Epilepsy Attending: Lora Havens Referring Physician/Provider: Dr Laurelyn Sickle Date: 07/21/2020 Duration: 25.23 mins Patient history: 64yo M s/p cardiac arrest. EEG to evaluate for seizure. Level of alertness: comatose AEDs during EEG study: Versed Technical aspects: This EEG study was done with scalp electrodes positioned according to the 10-20 International system of electrode placement. Electrical activity was acquired at a sampling rate of _0  and reviewed with a high frequency filter of _1  and a low frequency filter of _2 . EEG data were recorded continuously and digitally stored. Description: EEG showed continuous generalized low amplitude 3 to 6 Hz theta-delta slowing. EEG was reactive to tactile stimulation. Hyperventilation and photic stimulation were not performed.   ABNORMALITY - Continuous slow, generalized IMPRESSION: This study is suggestive of severe diffuse encephalopathy, nonspecific etiology. No seizures or epileptiform discharges were seen throughout the recording. Priyanka Barbra Sarks   Overnight EEG with video  Result Date: 07/22/2020 Lora Havens, MD     07/22/2020  6:53 AM Patient Name: Vincent Horton MRN: [id number] Epilepsy Attending: Lora Havens Referring Physician/Provider: Dr Laurelyn Sickle Duration: 07/21/2020 0951 to 07/22/2020 0700  Patient history: 65yo M s/p cardiac arrest. EEG to evaluate for seizure.  Level of alertness: comatose  AEDs during  EEG study: Versed  Technical aspects: This EEG study was done with scalp electrodes positioned according to the 10-20 International system of electrode placement. Electrical activity was acquired at a sampling rate of _3  and reviewed with a high frequency filter of _4  and a low frequency filter of _5 . EEG data were recorded continuously and digitally stored.  Description: EEG showed continuous generalized low amplitude 3 to 6 Hz theta-delta slowing. EEG was reactive to tactile stimulation. Hyperventilation and photic stimulation were not performed.    ABNORMALITY - Continuous slow, generalized  IMPRESSION: This study is suggestive of severe diffuse encephalopathy, nonspecific etiology. No seizures or epileptiform discharges were seen throughout  the recording.  Lora Havens   ECHOCARDIOGRAM COMPLETE  Result Date: 07/21/2020    ECHOCARDIOGRAM REPORT   Patient Name:   GERMAIN KOOPMANN Date of Exam: 07/21/2020 Medical Rec #:  [id number]  Height:       64.0 in Accession #:    [id number] Weight:       113.3 lb Date of Birth:  06/13/1955   BSA:          1.537 m Patient Age:    25 years   BP:           89/71 mmHg Patient Gender: M          HR:           85 bpm. Exam Location:  Inpatient Procedure: 2D Echo STAT ECHO Indications:    acute myocardial infarction  History:        Patient has no prior history of Echocardiogram examinations.                 Cardiac arrest; CAD.  Sonographer:    Johny Chess Referring Phys: [place]  1. Coarse apical trabeculation and calcified LV apical false tendon (normal variant) -no mural thrombus. Left ventricular ejection fraction, by estimation, is 30 to 35%. The left ventricle has moderately decreased function. The left ventricle demonstrates regional wall motion abnormalities (see scoring diagram/findings for description). Left ventricular diastolic parameters are consistent with Grade I diastolic dysfunction (impaired relaxation). There is severe  hypokinesis of the left ventricular, entire anteroseptal wall, anterior wall, apical segment and inferoapical segment.  2. Right ventricular systolic function is normal. The right ventricular size is normal. There is normal pulmonary artery systolic pressure. The estimated right ventricular systolic pressure is 06.2 mmHg.  3. The pericardial effusion is posterior to the left ventricle.  4. The mitral valve is abnormal. Trivial mitral valve regurgitation.  5. The aortic valve is tricuspid. Aortic valve regurgitation is not visualized.  6. The inferior vena cava is normal in size with greater than 50% respiratory variability, suggesting right atrial pressure of 3 mmHg. Comparison(s): No prior Echocardiogram. Findings suggestive of LAD territory ischemia/infarct. Conclusion(s)/Recommendation(s): Critical findings reported to Dr. Aundra Dubin and acknowledged at 07/21/2020 at 11:35 am. FINDINGS  Left Ventricle: Coarse apical trabeculation and calcified LV apical false tendon (normal variant) -no mural thrombus. Left ventricular ejection fraction, by estimation, is 30 to 35%. The left ventricle has moderately decreased function. The left ventricle demonstrates regional wall motion abnormalities. Severe hypokinesis of the left ventricular, entire anteroseptal wall, anterior wall, apical segment and inferoapical segment. The left ventricular internal cavity size was normal in size. There is no left ventricular hypertrophy. Left ventricular diastolic parameters are consistent with Grade I diastolic dysfunction (impaired relaxation). Normal left ventricular filling pressure. Right Ventricle: The right ventricular size is normal. No increase in right ventricular wall thickness. Right ventricular systolic function is normal. There is normal pulmonary artery systolic pressure. The tricuspid regurgitant velocity is 2.58 m/s, and  with an assumed right atrial pressure of 3 mmHg, the estimated right ventricular systolic pressure is 37.6  mmHg. Left Atrium: Left atrial size was normal in size. Right Atrium: Right atrial size was normal in size. Pericardium: Trivial pericardial effusion is present. The pericardial effusion is posterior to the left ventricle. Mitral Valve: The mitral valve is abnormal. There is mild thickening of the mitral valve leaflet(s). Trivial mitral valve regurgitation. Tricuspid Valve: The tricuspid valve is grossly normal. Tricuspid valve regurgitation is mild. Aortic Valve:  The aortic valve is tricuspid. Aortic valve regurgitation is not visualized. Pulmonic Valve: The pulmonic valve was normal in structure. Pulmonic valve regurgitation is mild. Aorta: The aortic root and ascending aorta are structurally normal, with no evidence of dilitation. Venous: The inferior vena cava is normal in size with greater than 50% respiratory variability, suggesting right atrial pressure of 3 mmHg. IAS/Shunts: No atrial level shunt detected by color flow Doppler.  LEFT VENTRICLE PLAX 2D LVIDd:         3.90 cm Diastology LVIDs:         3.10 cm LV e' medial:    5.55 cm/s LV PW:         0.90 cm LV E/e' medial:  7.6 LV IVS:        0.90 cm LV e' lateral:   7.62 cm/s                        LV E/e' lateral: 5.5  RIGHT VENTRICLE             IVC RV S prime:     15.30 cm/s  IVC diam: 1.40 cm TAPSE (M-mode): 1.8 cm LEFT ATRIUM             Index       RIGHT ATRIUM          Index LA diam:        2.50 cm 1.63 cm/m  RA Area:     8.62 cm LA Vol (A2C):   24.0 ml 15.62 ml/m RA Volume:   16.80 ml 10.93 ml/m LA Vol (A4C):   16.1 ml 10.48 ml/m LA Biplane Vol: 21.3 ml 13.86 ml/m  AORTIC VALVE LVOT Vmax:   113.00 cm/s LVOT Vmean:  67.300 cm/s LVOT VTI:    0.128 m  AORTA Ao Asc diam: 3.20 cm MITRAL VALVE               TRICUSPID VALVE MV Area (PHT): 4.06 cm    TR Peak grad:   26.6 mmHg MV Decel Time: 187 msec    TR Vmax:        258.00 cm/s MV E velocity: 42.20 cm/s MV A velocity: 71.00 cm/s  SHUNTS MV E/A ratio:  0.59        Systemic VTI: 0.13 m Lyman Bishop  MD Electronically signed by Lyman Bishop MD Signature Date/Time: 07/21/2020/11:38:46 AM    Final      Medications:     Scheduled Medications: . aspirin  81 mg Oral Daily  . aspirin  300 mg Rectal Once  . chlorhexidine gluconate (MEDLINE KIT)  15 mL Mouth Rinse BID  . Chlorhexidine Gluconate Cloth  6 each Topical Daily  . fentaNYL (SUBLIMAZE) injection  25 mcg Intravenous Once  . furosemide  80 mg Intravenous Once  . heparin  5,000 Units Subcutaneous Q8H  . insulin aspart  0-15 Units Subcutaneous Q4H  . mouth rinse  15 mL Mouth Rinse 10 times per day  . rosuvastatin  40 mg Oral Daily  . sodium chloride flush  10-40 mL Intracatheter Q12H  . sodium chloride flush  3 mL Intravenous Q12H  . ticagrelor  90 mg Oral BID    Infusions: . sodium chloride Stopped (07/21/20 0707)  . sodium chloride    . sodium chloride    . sodium chloride Stopped (07/21/20 0727)  . dextrose 30 mL/hr at 07/22/20 0600  . famotidine (PEPCID) IV Stopped (07/21/20 2153)  . fentaNYL infusion  INTRAVENOUS 75 mcg/hr (07/22/20 0600)  . midazolam 4 mg/hr (07/22/20 0600)  . milrinone 0.25 mcg/kg/min (07/22/20 0600)  . norepinephrine (LEVOPHED) Adult infusion 30 mcg/min (07/22/20 0600)  . phenylephrine (NEO-SYNEPHRINE) Adult infusion Stopped (07/21/20 0835)  . piperacillin-tazobactam (ZOSYN)  IV 12.5 mL/hr at 07/22/20 0600    PRN Medications: sodium chloride, acetaminophen, fentaNYL, midazolam, ondansetron (ZOFRAN) IV, sodium chloride flush, sodium chloride flush    Assessment/Plan   1. CAD: Anterior STEMI with occluded proximal LAD.  Treated with DES but complicated by embolization to distal LAD (procedure completed with occluded distasl LAD).    - Continue ticagrelor and ASA 81.  - Crestor 40 daily. 2. Cardiogenic shock: Ischemic cardiomyopathy. Echo with EF 30-35%, no LV thrombus, LAD territory WMAs, RV normal, IVC normal. Currently on HCO3 gtt 50 cc/hr,  NE 30, milrinone 0.25.  CI 1.73 on initial RHC.   Co-ox this morning 60%.  CVP 9, I/Os+.  - Repeat lactate this morning. - Stop HCO3 gtt.  - Continue milrinone 0.25.  - Wean NE as able.  - Lasix 80 mg IV x 1 bolus this morning with KCl.  3. Acute hypoxemic respiratory failure: Aspiration PNA (lower lobe opacities on CXR) + pulmonary edema.  - On Zosyn per CCM.  - Vent per CCM.  4. Cardiac arrest: VF arrest associated with STEMI.  Patient shocked x 5, 15 min CPR before ROSC.  No further VT.  - Hypothermia protocol per CCM, rewarming this morning.  5. Neuro: Per notes, was purposefully moving prior to cath, but now with no response or gag.  EEG with severe diffuse encephalopathy.   - Wean sedation.  - eventual head CT.   CRITICAL CARE Performed by: Loralie Champagne  Total critical care time: 40 minutes  Critical care time was exclusive of separately billable procedures and treating other patients.  Critical care was necessary to treat or prevent imminent or life-threatening deterioration.  Critical care was time spent personally by me on the following activities: development of treatment plan with patient and/or surrogate as well as nursing, discussions with consultants, evaluation of patient's response to treatment, examination of patient, obtaining history from patient or surrogate, ordering and performing treatments and interventions, ordering and review of laboratory studies, ordering and review of radiographic studies, pulse oximetry and re-evaluation of patient's condition.  Loralie Champagne 07/22/2020 7:36 AM   Length of Stay: 1  Loralie Champagne, MD  07/22/2020, 7:36 AM  Advanced Heart Failure Team Pager [phone] (M-F; 7a - 5p)  Please contact [facility] for night-coverage after hours (5p -7a ) and weekends on [email or web address]

## 2020-07-22 NOTE — Progress Notes (Signed)
eLink Physician-Brief Progress Note Patient Name: Vincent Horton DOB: 19-Dec-1955 MRN: [id number]   Date of Service  07/22/2020  HPI/Events of Note  CBGs 62-70 requiring D50 x 2. Bicarb drip (in D5W) decreased to 50 cc from 150. No mention in progress notes about starting TF yet.  eICU Interventions  V fib arrest. On pressors. Shock. Last pH 7.33, on bicarb gtt in D5W.  - start D10 at 30 ml/hr and follow CBG. Consider Tube feeding from AM if able to tolerate.      Intervention Category Intermediate Interventions: Other:  Ranee Gosselin 07/22/2020, 4:10 AM

## 2020-07-23 ENCOUNTER — Inpatient Hospital Stay (HOSPITAL_COMMUNITY): Payer: Medicare Other

## 2020-07-23 ENCOUNTER — Encounter (HOSPITAL_COMMUNITY): Payer: Self-pay | Admitting: Cardiology

## 2020-07-23 DIAGNOSIS — E639 Nutritional deficiency, unspecified: Secondary | ICD-10-CM | POA: Insufficient documentation

## 2020-07-23 DIAGNOSIS — R570 Cardiogenic shock: Secondary | ICD-10-CM | POA: Diagnosis not present

## 2020-07-23 DIAGNOSIS — I469 Cardiac arrest, cause unspecified: Secondary | ICD-10-CM | POA: Diagnosis not present

## 2020-07-23 DIAGNOSIS — I2102 ST elevation (STEMI) myocardial infarction involving left anterior descending coronary artery: Secondary | ICD-10-CM | POA: Diagnosis not present

## 2020-07-23 DIAGNOSIS — E46 Unspecified protein-calorie malnutrition: Secondary | ICD-10-CM

## 2020-07-23 DIAGNOSIS — E43 Unspecified severe protein-calorie malnutrition: Secondary | ICD-10-CM | POA: Insufficient documentation

## 2020-07-23 LAB — CBC
HCT: 25.8 % — ABNORMAL LOW (ref 39.0–52.0)
Hemoglobin: 8.8 g/dL — ABNORMAL LOW (ref 13.0–17.0)
MCH: 31.3 pg (ref 26.0–34.0)
MCHC: 34.1 g/dL (ref 30.0–36.0)
MCV: 91.8 fL (ref 80.0–100.0)
Platelets: 196 10*3/uL (ref 150–400)
RBC: 2.81 MIL/uL — ABNORMAL LOW (ref 4.22–5.81)
RDW: 14 % (ref 11.5–15.5)
WBC: 20.8 10*3/uL — ABNORMAL HIGH (ref 4.0–10.5)
nRBC: 0 % (ref 0.0–0.2)

## 2020-07-23 LAB — COMPREHENSIVE METABOLIC PANEL
ALT: 158 U/L — ABNORMAL HIGH (ref 0–44)
AST: 262 U/L — ABNORMAL HIGH (ref 15–41)
Albumin: 2.3 g/dL — ABNORMAL LOW (ref 3.5–5.0)
Alkaline Phosphatase: 66 U/L (ref 38–126)
Anion gap: 8 (ref 5–15)
BUN: 35 mg/dL — ABNORMAL HIGH (ref 8–23)
CO2: 28 mmol/L (ref 22–32)
Calcium: 7.1 mg/dL — ABNORMAL LOW (ref 8.9–10.3)
Chloride: 102 mmol/L (ref 98–111)
Creatinine, Ser: 1.23 mg/dL (ref 0.61–1.24)
GFR, Estimated: >60 mL/min (ref >60)
Glucose, Bld: 157 mg/dL — ABNORMAL HIGH (ref 70–99)
Potassium: 3.4 mmol/L — ABNORMAL LOW (ref 3.5–5.1)
Sodium: 138 mmol/L (ref 135–145)
Total Bilirubin: 1 mg/dL (ref 0.3–1.2)
Total Protein: 4.8 g/dL — ABNORMAL LOW (ref 6.5–8.1)

## 2020-07-23 LAB — HEPARIN LEVEL (UNFRACTIONATED): Heparin Unfractionated: 0.26 IU/mL — ABNORMAL LOW (ref 0.30–0.70)

## 2020-07-23 LAB — COOXEMETRY PANEL
Carboxyhemoglobin: 0.5 % (ref 0.5–1.5)
Carboxyhemoglobin: 0.5 % (ref 0.5–1.5)
Methemoglobin: 1.3 % (ref 0.0–1.5)
Methemoglobin: 1.2 % (ref 0.0–1.5)
O2 Saturation: 54.4 %
O2 Saturation: 49.5 %
Total hemoglobin: 11.7 g/dL — ABNORMAL LOW (ref 12.0–16.0)
Total hemoglobin: 9.6 g/dL — ABNORMAL LOW (ref 12.0–16.0)

## 2020-07-23 LAB — GLUCOSE, CAPILLARY
Glucose-Capillary: 154 mg/dL — ABNORMAL HIGH (ref 70–99)
Glucose-Capillary: 211 mg/dL — ABNORMAL HIGH (ref 70–99)
Glucose-Capillary: 117 mg/dL — ABNORMAL HIGH (ref 70–99)
Glucose-Capillary: 139 mg/dL — ABNORMAL HIGH (ref 70–99)
Glucose-Capillary: 162 mg/dL — ABNORMAL HIGH (ref 70–99)
Glucose-Capillary: 129 mg/dL — ABNORMAL HIGH (ref 70–99)
Glucose-Capillary: 131 mg/dL — ABNORMAL HIGH (ref 70–99)

## 2020-07-23 LAB — POCT I-STAT 7, (LYTES, BLD GAS, ICA,H+H)
Acid-Base Excess: 7 mmol/L — ABNORMAL HIGH (ref 0.0–2.0)
Bicarbonate: 30.5 mmol/L — ABNORMAL HIGH (ref 20.0–28.0)
Calcium, Ion: 1.04 mmol/L — ABNORMAL LOW (ref 1.15–1.40)
HCT: 24 % — ABNORMAL LOW (ref 39.0–52.0)
Hemoglobin: 8.2 g/dL — ABNORMAL LOW (ref 13.0–17.0)
O2 Saturation: 99 %
Patient temperature: 36.8
Potassium: 3.3 mmol/L — ABNORMAL LOW (ref 3.5–5.1)
Sodium: 139 mmol/L (ref 135–145)
TCO2: 32 mmol/L (ref 22–32)
pCO2 arterial: 38.2 mmHg (ref 32.0–48.0)
pH, Arterial: 7.508 — ABNORMAL HIGH (ref 7.350–7.450)
pO2, Arterial: 103 mmHg (ref 83.0–108.0)

## 2020-07-23 LAB — PHOSPHORUS: Phosphorus: 3.1 mg/dL (ref 2.5–4.6)

## 2020-07-23 LAB — MAGNESIUM: Magnesium: 1.9 mg/dL (ref 1.7–2.4)

## 2020-07-23 MED ORDER — DOCUSATE SODIUM 50 MG/5ML PO LIQD
100.0000 mg | Freq: Two times a day (BID) | ORAL | Status: DC
  Administered 2020-07-23 – 2020-08-03 (×17): 100 mg
  Filled 2020-07-23 (×20): qty 10

## 2020-07-23 MED ORDER — FUROSEMIDE 10 MG/ML IJ SOLN
60.0000 mg | Freq: Once | INTRAMUSCULAR | Status: AC
  Administered 2020-07-23: 60 mg via INTRAVENOUS
  Filled 2020-07-23: qty 6

## 2020-07-23 MED ORDER — POLYETHYLENE GLYCOL 3350 17 G PO PACK
17.0000 g | PACK | Freq: Every day | ORAL | Status: DC
  Administered 2020-07-23 – 2020-08-03 (×5): 17 g
  Filled 2020-07-23 (×6): qty 1

## 2020-07-23 MED ORDER — POTASSIUM CHLORIDE 10 MEQ/50ML IV SOLN
10.0000 meq | INTRAVENOUS | Status: AC
Start: 2020-07-23 — End: 2020-07-23
  Administered 2020-07-23 (×4): 10 meq via INTRAVENOUS
  Filled 2020-07-23 (×4): qty 50

## 2020-07-23 MED ORDER — HEPARIN (PORCINE) 25000 UT/250ML-% IV SOLN
900.0000 [IU]/h | INTRAVENOUS | Status: DC
  Administered 2020-07-23: 700 [IU]/h via INTRAVENOUS
  Administered 2020-07-24: 900 [IU]/h via INTRAVENOUS
  Filled 2020-07-23 (×2): qty 250

## 2020-07-23 MED ORDER — POTASSIUM CHLORIDE 20 MEQ PO PACK
20.0000 meq | PACK | Freq: Once | ORAL | Status: AC
  Administered 2020-07-23: 20 meq
  Filled 2020-07-23: qty 1

## 2020-07-23 MED ORDER — VITAL AF 1.2 CAL PO LIQD
1000.0000 mL | ORAL | Status: DC
  Administered 2020-07-23 – 2020-08-01 (×8): 1000 mL

## 2020-07-23 MED ORDER — POTASSIUM CHLORIDE 20 MEQ PO PACK
40.0000 meq | PACK | Freq: Once | ORAL | Status: AC
  Administered 2020-07-23: 40 meq
  Filled 2020-07-23: qty 2

## 2020-07-23 MED ORDER — PROSOURCE TF PO LIQD: 45.0000 mL | Freq: Two times a day (BID) | ORAL | Status: DC

## 2020-07-23 MED ORDER — SODIUM CHLORIDE 0.9 % IV SOLN
INTRAVENOUS | Status: DC | PRN
  Administered 2020-08-04: 10 mL via INTRA_ARTERIAL

## 2020-07-23 MED ORDER — HEPARIN BOLUS VIA INFUSION
1300.0000 [IU] | Freq: Once | INTRAVENOUS | Status: AC
  Administered 2020-07-23: 1300 [IU] via INTRAVENOUS
  Filled 2020-07-23: qty 1300

## 2020-07-23 MED FILL — Heparin Sod (Porcine)-NaCl IV Soln 1000 Unit/500ML-0.9%: INTRAVENOUS | Qty: 500 | Status: AC

## 2020-07-23 NOTE — Progress Notes (Addendum)
NAMECaio Horton, MRN:  [id number], DOB:  May 04, 1955, LOS: 2 ADMISSION DATE:  07/21/2020, CONSULTATION DATE:  07/22/2019 REFERRING MD:  [place], CHIEF COMPLAINT:  Post arrest   History of Present Illness:  Patient is a 65 year old male with limited medical history found down by family, EMS was called, he was found to be in V. fib shocked 5 x 3 mg epinephrine 450 mg of amiodarone with ROSC obtained.  He was emergently taken to the Cath Lab showing ST segment changes in anterior lateral leads status post revascularization of LAD.  He was noted to have been moving spontaneously at time of cardiac catheterization.  Transferred to the ICU.  PCCM consulted for help with management  Pertinent  Medical History    has no past medical history on file.  Significant Hospital Events: Including procedures, antibiotic start and stop dates in addition to other pertinent events    5/21 Vfib arrest, Cardiac catheterization, TTM started, right IJ placed  Interim History / Subjective:  Concern for A-fib overnight, rate controlled on IV amio  Able to follow commands with family this AM  Objective   Blood pressure 116/82, pulse 83, temperature (!) 97.34 F (36.3 C), resp. rate (!) 22, height 5' 4.02" (1.626 m), weight 53.2 kg, SpO2 98 %. CVP:  [6 mmHg-18 mmHg] 13 mmHg  Vent Mode: PRVC FiO2 (%):  [60 %-100 %] 60 % Set Rate:  [22 bmp-24 bmp] 22 bmp Vt Set:  [470 mL] 470 mL PEEP:  [5 cmH20] 5 cmH20 Plateau Pressure:  [16 cmH20-27 cmH20] 22 cmH20   Intake/Output Summary (Last 24 hours) at 07/23/2020 0840 Last data filed at 07/23/2020 0800 Gross per 24 hour  Intake 4031.95 ml  Output 2250 ml  Net 1781.95 ml   Filed Weights   07/22/20 0600 07/23/20 0500 07/23/20 0800  Weight: 50.8 kg 53.2 kg 53.2 kg    Examination: General: Acute on chronic ill appearing deconditioned elderly male on mechanical ventilation, in NAD HEENT: ETT, MM pink/moist, PERRL,  Neuro: Able to follow commands when spoken in native  language CV: s1s2 regular rate and rhythm, no murmur, rubs, or gallops,  PULM:  Clear to ascultation, no added breath sounds, no increased work of breathing   GI: soft, bowel sounds active in all 4 quadrants, non-tender, non-distended, tolerating  Extremities: warm/dry, no edema  Skin: no rashes or lesions  Labs/imaging that I have personally reviewed    Chest x-ray 5/21 with pulmonary edema  Head CT 5/22 negative for cerebral edema no evidence of anoxic injury  5/23 pertinent lab work: K 3.4, AST 262, ALT 158, WBC 20.8, hgb 8.8's 49.5  Resolved Hospital Problem list     Assessment & Plan:  Cardiac arrest resulting in development of cardiogenic shock -V. fib arrest associated with STEMI, patient shocked 5 times with 15 minutes of CPR prior to ROSC Status post STEMI post LAD stent, cardiogenic shock -Emergent cath completed 5/21 occluded proximal LAD status post DES.  Per heart failure DES embolized and again LAD is completely occluded Cardiomyopathy EF 30-35 -Echo 5/21 with EF 30 to 35%, coarse apical trabeculation and calcified LV apical false tendon, LV with regional wall motion abnormality and grade 1 diastolic dysfunction P: Heart failure and cardiology following, appreciate assistance Continue Ticagrelor and aspirin Continue statin Coox 49.5, trend  Titration of inotropes and vasopressors per heart failure Diuresis as able Normothermia protocol Continuous telemetry Strict intake and output Monitor volume status  Atrial fibrillation P: Continuous telemetry Continue amiodarone drip  Continue heparin drip  Acute respiratory failure secondary to cardiac arrest, MI Right-sided aspiration pneumonia Pulmonary edema P: Continue ventilator support with lung protective strategies  Wean PEEP and FiO2 for sats greater than 90%. Head of bed elevated 30 degrees. Plateau pressures less than 30 cm H20.  Follow intermittent chest x-ray and ABG.   Trial of SBT today Patient  able to follow commands when asked in native language Ensure adequate pulmonary hygiene  Follow cultures  VAP bundle in place  PAD protocol Continue empiric antibiotics\  AKI, improving -Unknown baseline, creatinine continues to downtrend P: Follow renal function  Monitor urine output Trend Bmet Avoid nephrotoxins Ensure adequate renal perfusion  IV hydration  Elevated transaminases secondary to shock liver P: Continue to trend LFTs Avoid hepatotoxins Supportive care  Acute encephalopathy, metabolic -Secondary to cardiac arrest -In the setting of cardiac arrest concern for anoxic injury however CT head negative and morning of 5/23 patient is able to follow commands when spoken in native language P: Remains on continuous EEG No signs of seizures thus far Minimize sedation as able Maintain neuro protective measures; goal for eurothermia, euglycemia, eunatermia, normoxia, and PCO2 goal of 35-40 Nutrition and bowel regiment  Seizure precautions  Aspirations precautions   Hypokalemia P: Supplement Trend  At risk malnutrition  P: If able to extubate will start feeds but patient will likely need Cortrack   Best practice   Diet:  NPO Pain/Anxiety/Delirium protocol (if indicated): Yes (RASS goal -1) VAP protocol (if indicated): Yes DVT prophylaxis: Subcutaneous Heparin and SCD GI prophylaxis: H2B Glucose control:  SSI No Central venous access:  Yes, and it is still needed Arterial line:  N/A Foley:  Yes, and it is still needed Mobility:  bed rest  PT consulted: N/A Last date of multidisciplinary goals of care discussion: Family updated at bedside 5/23 Code Status:  full code Disposition: ICU  Critical care time:    Performed by: Johnsie Cancel  Total critical care time: 40 minutes  Critical care time was exclusive of separately billable procedures and treating other patients.  Critical care was necessary to treat or prevent imminent or life-threatening  deterioration.  Critical care was time spent personally by me on the following activities: development of treatment plan with patient and/or surrogate as well as nursing, discussions with consultants, evaluation of patient's response to treatment, examination of patient, obtaining history from patient or surrogate, ordering and performing treatments and interventions, ordering and review of laboratory studies, ordering and review of radiographic studies, pulse oximetry and re-evaluation of patient's condition.  Johnsie Cancel, NP-C [facility] Personal contact information can be found on Amion  07/23/2020, 9:06 AM    PCCM:  65 yo M, found down at home, VFIB arrest, DCCV X5, 3 rounds of epi + amiodarone. Anterior STEMI, taken for LHC s/p LAD stent. Cooled with pads   BP 96/74   Pulse 91   Temp (!) 96.98 F (36.1 C)   Resp (!) 22   Ht 5' 4.02" (1.626 m)   Wt 53.2 kg   SpO2 97%   BMI 20.12 kg/m   Gen: elderly, thin male, resting in bed, intubated on mechanical life support  HENT: NCAT tracking Heart: RRR, s1 s2 Lung: BL vented breaths Chest: cooling pads in place   Labs:  Sodium 138 Potassium 3.4 Scr: 1.23 WBC 20.8  A:  Cardiac Arrest Cardiogenic shock  Ischemic Cardiomyopathy  Atrial Fibrillation  Acute hypoxemic respiratory failure on mechanical ventilation  P: Adult mechanical vent protocol  Wean from PEEP and FiO2  Remains on ticagrelor and asa  Remains on statin  Remains on amio  Heparin ggt  Remains on abx  Wean pressors to goal MAP >65 He does follow commands which is reassuring but he in not ready to come off vent   I met with patients two sons at bedside to discuss current state.   I also discussed case with Dr. Gwenlyn Found from interventional cardiology   This patient is critically ill with multiple organ system failure; which, requires frequent high complexity decision making, assessment, support, evaluation, and titration of  therapies. This was completed through the application of advanced monitoring technologies and extensive interpretation of multiple databases. During this encounter critical care time was devoted to patient care services described in this note for 42 minutes.   Garner Nash, DO [facility] 07/23/2020 1:21 PM

## 2020-07-23 NOTE — Progress Notes (Signed)
Dr Shirlee Latch paged regarding patient systolic blood pressure maintaining in the mid 70's to 80's; however, MAPs remain above 65. MD gave verbal order to place arterial line to ensure accuracy of blood pressure at this time.

## 2020-07-23 NOTE — Progress Notes (Signed)
ANTICOAGULATION CONSULT NOTE  Pharmacy Consult for heparin gtt Indication: atrial fibrillation  No Known Allergies  Patient Measurements: Height: 5' 4.02" (162.6 cm) Weight: 53.2 kg (117 lb 4.6 oz) IBW/kg (Calculated) : 59.24 Heparin Dosing Weight: 53.2kg   Vital Signs: Temp: 99.5 F (37.5 C) (05/23 1630) Temp Source: Esophageal (05/23 1600) BP: 71/55 (05/23 1630) Pulse Rate: 90 (05/23 1630)  Labs: Recent Labs    07/21/20 0109 07/21/20 0149 07/21/20 0429 07/21/20 0505 07/21/20 0547 07/22/20 0340 07/22/20 0846 07/22/20 2000 07/22/20 2002 07/23/20 0315 07/23/20 0325 07/23/20 1548  HGB 13.8   < > 9.7*   < >  --   --  10.2* 8.5* 8.5* 8.8* 8.2*  --   HCT 42.3   < > 29.2*   < >  --   --  31.1* 25.6* 25.0* 25.8* 24.0*  --   PLT 216  --  205  --   --   --  208 206  --  196  --   --   APTT  --   --  134*  --  53*  --   --   --   --   --   --   --   LABPROT  --   --  16.9*  --  15.8*  --   --   --   --   --   --   --   INR  --   --  1.4*  --  1.3*  --   --   --   --   --   --   --   HEPARINUNFRC  --   --   --   --   --   --   --   --   --   --   --  0.26*  CREATININE 1.40*   < > 1.28*  --   --  1.27*  --  1.31*  --  1.23  --   --   TROPONINIHS 2,747*  --  9,770*  --   --   --   --   --   --   --   --   --    < > = values in this interval not displayed.    Estimated Creatinine Clearance: 44.5 mL/min (by C-G formula based on SCr of 1.23 mg/dL).  Assessment: 65 yo M admitted for vfib arrest with CPR found to have STEMI now s/p PCI.  Now suspicious for development of new Afib with RVR and pharmacy consulted to dose heparin. With current info, patient is not on anticoagulation prior to admission.  -heparin level= 0.26 on  700 units/hr   Goal of Therapy:  Heparin level 0.3-0.5 units/ml Monitor platelets by anticoagulation protocol: Yes   Plan:  -Increase heparin to 800 units/hr -Daily heparin level and CBC  Harland German, PharmD Clinical Pharmacist **Pharmacist  phone directory can now be found on [email or web address] ([email or web address]).  Listed under [facility].

## 2020-07-23 NOTE — Progress Notes (Signed)
Initial Nutrition Assessment  DOCUMENTATION CODES:   Severe malnutrition in context of chronic illness  INTERVENTION:   Tube feeding: -Vital AF 1.2 @ 20 ml/hr via Cortrak -Increase by 10 ml Q8 hours to goal rate of 55 ml/hr (1320 ml)  Provides: 1584 kcals, 99 grams protein, 1071 ml free water.   Monitor magnesium, potassium, and phosphorus daily for at least 3 days, MD to replete as needed, as pt is at risk for refeeding syndrome.   NUTRITION DIAGNOSIS:   Severe Malnutrition related to chronic illness as evidenced by severe fat depletion,severe muscle depletion.  GOAL:   Patient will meet greater than or equal to 90% of their needs  MONITOR:   Vent status,TF tolerance,Weight trends,Skin,Labs  REASON FOR ASSESSMENT:   Ventilator    ASSESSMENT:   Patient without PMH on file. Presents this admission after v.fib arrest associated with STEMI.   5/21- emergent cath, occluded proximal LAD status post DES complicated by embolization to distal LAD  Pt discussed during ICU rounds and with RN.   Rewarmed. Requiring pressors x2. Able to follow some commands with family. Started on D10 for hypoglycemia. Gastric Cortrak placed at bedside. Okay to start feeding per CCM. Hopeful for improvement in CBGs without D10. Monitor for refeeding.   No BM since admit. Has regimen in place. Follow for results.   Weight history limited. Utilize 50.8 kg as EDW for now.   Patient is currently intubated on ventilator support MV: 10.8 L/min Temp (24hrs), Avg:98 F (36.7 C), Min:96.6 F (35.9 C), Max:99.1 F (37.3 C)  UOP: 2135 ml x 24 hrs   Drips: precedex, D10 @ 10 ml/hr, levophed, vasopressin  Medications: colace, SS novolog, miralax Labs: K 3.4 (L) CBG 70-286  NUTRITION - FOCUSED PHYSICAL EXAM:  Flowsheet Row Most Recent Value  Orbital Region Severe depletion  Upper Arm Region Severe depletion  Thoracic and Lumbar Region Severe depletion  Buccal Region Unable to assess  Temple  Region Severe depletion  Clavicle Bone Region Severe depletion  Clavicle and Acromion Bone Region Severe depletion  Scapular Bone Region Severe depletion  Dorsal Hand Unable to assess  Patellar Region Severe depletion  Anterior Thigh Region Severe depletion  Posterior Calf Region Severe depletion  Edema (RD Assessment) None     Diet Order:   Diet Order            Diet NPO time specified  Diet effective now                 EDUCATION NEEDS:   Not appropriate for education at this time  Skin:  Skin Assessment: Reviewed RN Assessment  Last BM:  PTA  Height:   Ht Readings from Last 1 Encounters:  07/23/20 5' 4.02" (1.626 m)    Weight:   Wt Readings from Last 1 Encounters:  07/23/20 53.2 kg    BMI:  Body mass index is 20.12 kg/m.  Estimated Nutritional Needs:   Kcal:  1350-1600  Protein:  80-110  Fluid:  >/= 1.5 L/day  Vanessa Kick RD, LDN Clinical Nutrition Pager listed in AMION

## 2020-07-23 NOTE — Progress Notes (Signed)
Patient ID: Vincent Horton, male   DOB: 07/16/1955, 65 y.o.   MRN: [id number] P    Advanced Heart Failure Rounding Note  PCP-Cardiologist: None   Subjective:    Hypotensive/tachycardia last night (?atrial fibrillation but rhythm difficult to artifact).   Pressors increased again. Currently on milrinone 0.25, NE 20, vasopressin 0.04, phenylephrine 20. Amiodarone gtt started, currently clearly in NSR.  SBP now 100s generally with stable MAP.  I/Os positive with CVP 9-10.  Co-ox 54%.    Per nursing, he wakes up and moves around.  Hard to tell if he follows commands, does not speak English per niece.   Vent with FiO2 0.7, ABG 7.51/38/103. Zosyn for possible aspiration PNA.   EEG with severe diffuse encephalopathy. CT head with chronic microvascular changes.   Echo: EF 30-35%, no LV thrombus, LAD territory WMAs, RV normal, IVC normal.    Objective:   Weight Range: 53.2 kg Body mass index is 20.13 kg/m.   Vital Signs:   Temp:  [96.1 F (35.6 C)-99.1 F (37.3 C)] 97.34 F (36.3 C) (05/23 0700) Pulse Rate:  [81-141] 88 (05/23 0700) Resp:  [11-28] 22 (05/23 0700) BP: (66-123)/(49-85) 116/82 (05/23 0700) SpO2:  [84 %-100 %] 97 % (05/23 0700) Arterial Line BP: (96-118)/(53-82) 106/82 (05/23 0300) FiO2 (%):  [70 %-100 %] 70 % (05/23 0400) Weight:  [53.2 kg] 53.2 kg (05/23 0500) Last BM Date:  (PTA)  Weight change: Filed Weights   07/21/20 0330 07/22/20 0600 07/23/20 0500  Weight: 51.4 kg 50.8 kg 53.2 kg    Intake/Output:   Intake/Output Summary (Last 24 hours) at 07/23/2020 0742 Last data filed at 07/23/2020 0700 Gross per 24 hour  Intake 4276.69 ml  Output 2135 ml  Net 2141.69 ml      Physical Exam    General: Intubated/sedated Neck: No JVD, no thyromegaly or thyroid nodule.  Lungs: Crackles at bases.  CV: Nondisplaced PMI.  Heart regular S1/S2, no S3/S4, no murmur.  No peripheral edema.   Abdomen: Soft, nontender, no hepatosplenomegaly, no distention.  Skin: Intact  without lesions or rashes.  Neurologic: Wakes up agitated, ?commands (no [place]).  Extremities: No clubbing or cyanosis.  HEENT: Normal.    Telemetry   NSR 90s (personally reviewed)   Labs    CBC Recent Labs    07/21/20 0109 07/21/20 0149 07/21/20 0429 07/21/20 0505 07/22/20 2000 07/22/20 2002 07/23/20 0315 07/23/20 0325  WBC 13.2*  --  16.7*   < > 24.8*  --  20.8*  --   NEUTROABS 6.4  --  13.4*  --   --   --   --   --   HGB 13.8   < > 9.7*   < > 8.5*   < > 8.8* 8.2*  HCT 42.3   < > 29.2*   < > 25.6*   < > 25.8* 24.0*  MCV 97.0  --  95.1   < > 93.4  --  91.8  --   PLT 216  --  205   < > 206  --  196  --    < > = values in this interval not displayed.   Basic Metabolic Panel Recent Labs    07/21/20 0429 07/21/20 0505 07/22/20 2000 07/22/20 2002 07/23/20 0315 07/23/20 0325  NA 144   < > 139   < > 138 139  K 4.0   < > 4.3   < > 3.4* 3.3*  CL 107   < > 102  --  102  --   CO2 32   < > 29  --  28  --   GLUCOSE 286*   < > 238*  --  157*  --   BUN 15   < > 36*  --  35*  --   CREATININE 1.28*   < > 1.31*  --  1.23  --   CALCIUM 6.5*   < > 6.1*  --  7.1*  --   MG 1.9  --   --   --   --   --    < > = values in this interval not displayed.   Liver Function Tests Recent Labs    07/22/20 2000 07/23/20 0315  AST 232* 262*  ALT 142* 158*  ALKPHOS 58 66  BILITOT 0.8 1.0  PROT 4.5* 4.8*  ALBUMIN 2.2* 2.3*   No results for input(s): LIPASE, AMYLASE in the last 72 hours. Cardiac Enzymes No results for input(s): CKTOTAL, CKMB, CKMBINDEX, TROPONINI in the last 72 hours.  BNP: BNP (last 3 results) No results for input(s): BNP in the last 8760 hours.  ProBNP (last 3 results) No results for input(s): PROBNP in the last 8760 hours.   D-Dimer No results for input(s): DDIMER in the last 72 hours. Hemoglobin A1C Recent Labs    07/21/20 0109  HGBA1C 6.5*   Fasting Lipid Panel Recent Labs    07/21/20 0109  CHOL 172  HDL 35*  LDLCALC 108*  TRIG 144   CHOLHDL 4.9   Thyroid Function Tests No results for input(s): TSH, T4TOTAL, T3FREE, THYROIDAB in the last 72 hours.  Invalid input(s): FREET3  Other results:   Imaging    CT HEAD WO CONTRAST  Result Date: 07/22/2020 CLINICAL DATA:  Anoxic brain damage EXAM: CT HEAD WITHOUT CONTRAST TECHNIQUE: Contiguous axial images were obtained from the base of the skull through the vertex without intravenous contrast. COMPARISON:  None. FINDINGS: Brain: Ventricle size and cerebral volume normal. Mild hypodensity in the frontal white matter bilaterally likely chronic. Negative for acute cortical infarct. Negative for acute hemorrhage or mass. No significant brain edema. Vascular: Negative for hyperdense vessel Skull: Negative Sinuses/Orbits: Mucosal edema paranasal sinuses. Air-fluid level left sphenoid sinus. Patient is intubated and has NG tube in place. Other: None IMPRESSION: Negative for cerebral edema.  No evidence of anoxic brain injury Hypodensity in the deep white matter in the frontal lobes bilaterally likely due to chronic microvascular ischemia. Electronically Signed   By: Franchot Gallo M.D.   On: 07/22/2020 16:05     Medications:     Scheduled Medications: . aspirin  81 mg Oral Daily  . aspirin  300 mg Rectal Once  . chlorhexidine gluconate (MEDLINE KIT)  15 mL Mouth Rinse BID  . Chlorhexidine Gluconate Cloth  6 each Topical Daily  . fentaNYL (SUBLIMAZE) injection  25 mcg Intravenous Once  . furosemide  60 mg Intravenous Once  . insulin aspart  0-15 Units Subcutaneous Q4H  . mouth rinse  15 mL Mouth Rinse 10 times per day  . potassium chloride  20 mEq Per Tube Once  . rosuvastatin  40 mg Oral Daily  . sodium chloride flush  10-40 mL Intracatheter Q12H  . sodium chloride flush  3 mL Intravenous Q12H  . ticagrelor  90 mg Oral BID    Infusions: . sodium chloride Stopped (07/21/20 0707)  . sodium chloride    . sodium chloride    . amiodarone 30 mg/hr (07/23/20 0700)  .  dexmedetomidine (  PRECEDEX) IV infusion 0.7 mcg/kg/hr (07/23/20 0700)  . dextrose 10 mL/hr at 07/23/20 0700  . epinephrine Stopped (07/22/20 2038)  . famotidine (PEPCID) IV Stopped (07/22/20 2208)  . fentaNYL infusion INTRAVENOUS 125 mcg/hr (07/23/20 0700)  . midazolam Stopped (07/22/20 0730)  . milrinone 0.25 mcg/kg/min (07/23/20 0700)  . norepinephrine (LEVOPHED) Adult infusion 28 mcg/min (07/23/20 0700)  . piperacillin-tazobactam (ZOSYN)  IV 12.5 mL/hr at 07/23/20 0700  . potassium chloride 10 mEq (07/23/20 0702)  . vasopressin 0.04 Units/min (07/23/20 0700)    PRN Medications: sodium chloride, acetaminophen, fentaNYL, midazolam, midazolam, ondansetron (ZOFRAN) IV, sodium chloride flush, sodium chloride flush    Assessment/Plan   1. CAD: Anterior STEMI with occluded proximal LAD.  Treated with DES but complicated by embolization to distal LAD (procedure completed with occluded distal LAD).    - Continue ticagrelor and ASA 81.  - Crestor 40 daily. 2. Cardiogenic shock: Ischemic cardiomyopathy. Echo with EF 30-35%, no LV thrombus, LAD territory WMAs, RV normal, IVC normal. Currently on NE 20, milrinone 0.25, vasopressin 0.04, phenylephrine 20.  Co-ox 54%.  CVP 9, I/Os+.  - Increase milrinone to 0.375 today.  - Wean off phenylephrine, can use more NE if necessary but hopefully not.  Continue vasopressin for now, wean next. - Lasix 60 mg IV x 1 bolus this morning with KCl.  3. Acute hypoxemic respiratory failure: Aspiration PNA (lower lobe opacities on CXR) + pulmonary edema. WBCs 21.  - On Zosyn per CCM.  - Vent per CCM.  4. Cardiac arrest: VF arrest associated with STEMI.  Patient shocked x 5, 15 min CPR before ROSC.  No further VT.  Now rewarmed s/p hypothermia protocol.  5. Neuro: EEG with severe diffuse encephalopathy.  CT head showed chronic microvascular changes.  Wakes up agitated, hard to tell purposeful motion (does not speak [place]).  - Wean sedation with family around.  6.  Anemia: Hgb 8.8, follow closely.  - Transfuse < 8.  7. Atrial fibrillation: Difficult to tell if truly AF with RVR but suspect (there was lots of artifact on telemetry and ECGs).  Currently in NSR.  - For now, will use heparin gtt.  - Amiodarone gtt 30.   Discussed with niece.    CRITICAL CARE Performed by: Loralie Champagne  Total critical care time: 40 minutes  Critical care time was exclusive of separately billable procedures and treating other patients.  Critical care was necessary to treat or prevent imminent or life-threatening deterioration.  Critical care was time spent personally by me on the following activities: development of treatment plan with patient and/or surrogate as well as nursing, discussions with consultants, evaluation of patient's response to treatment, examination of patient, obtaining history from patient or surrogate, ordering and performing treatments and interventions, ordering and review of laboratory studies, ordering and review of radiographic studies, pulse oximetry and re-evaluation of patient's condition.  Loralie Champagne 07/23/2020 7:42 AM   Length of Stay: 2  Loralie Champagne, MD  07/23/2020, 7:42 AM  Advanced Heart Failure Team Pager [phone] (M-F; 7a - 5p)  Please contact [facility] for night-coverage after hours (5p -7a ) and weekends on [email or web address]

## 2020-07-23 NOTE — Plan of Care (Signed)
  Problem: Clinical Measurements: Goal: Ability to maintain clinical measurements within normal limits will improve Outcome: Progressing Goal: Will remain free from infection Outcome: Progressing Goal: Diagnostic test results will improve Outcome: Progressing Goal: Respiratory complications will improve Outcome: Progressing   Problem: Nutrition: Goal: Adequate nutrition will be maintained Outcome: Progressing   

## 2020-07-23 NOTE — Progress Notes (Signed)
RT x2 attempted to obtain Aline, unsuccessful. RN aware.

## 2020-07-23 NOTE — Progress Notes (Signed)
A line out  SBP soft. On Norepi 30 mcg + vasopressin 0.04 + milrinone 0.375 mcg + amio drip  Agitated on vent . Sedation increased by nursing staff  CVP 10-11. SBP ok >100.   RT to replace A line.   Continue to monitor.   Yaqueline Gutter NP-C  4:26 PM

## 2020-07-23 NOTE — Progress Notes (Signed)
LTM discontinued; no skin breakdown was seen. Notified [facility].

## 2020-07-23 NOTE — Procedures (Signed)
Cortrak  Person Inserting Tube:  Vincent Horton, RD Tube Type:  Cortrak - 43 inches Tube Location:  Right nare Initial Placement:  Stomach Secured by: Bridle Technique Used to Measure Tube Placement:  Documented cm marking at nare/ corner of mouth Cortrak Secured At:  81 cm   Cortrak Tube Team Note:  Consult received to place a Cortrak feeding tube.   X-ray is required, abdominal x-ray has been ordered by the Cortrak team. Please confirm tube placement before using the Cortrak tube.   If the tube becomes dislodged please keep the tube and contact the Cortrak team at [email or web address] ([email or web address]) for replacement.  If after hours and replacement cannot be delayed, place a NG tube and confirm placement with an abdominal x-ray.    Vanessa Kick RD, LDN Clinical Nutrition Pager listed in AMION

## 2020-07-23 NOTE — Procedures (Addendum)
Patient Name:Vincent Horton OMQ:[id number] Epilepsy Attending:Zurie Platas Annabelle Harman Referring Physician/Provider:Dr Emelda Brothers Duration:07/22/2020 2003 to 07/23/2020 1132  Patient history:65yo M s/p cardiac arrest. EEG to evaluate for seizure.  Level of alertness:comatose  AEDs during EEG study:Versed  Technical aspects: This EEG study was done with scalp electrodes positioned according to the 10-20 International system of electrode placement. Electrical activity was acquired at a sampling rate of 500Hz  and reviewed with a high frequency filter of 70Hz  and a low frequency filter of 1Hz . EEG data were recorded continuously and digitally stored.   Description: EEG showed continuous generalized3 to 6 Hz theta-delta slowing. EEG was reactive to tactile stimulation.  Hyperventilation and photic stimulation were not performed.   Parts of study were difficult evaluate due to significant electrode artifact.  ABNORMALITY - Continuous slow, generalized  IMPRESSION: This study is suggestive of moderate to severe diffuse encephalopathy, nonspecific etiology.No seizures or epileptiform discharges were seen throughout the recording.  Kurstin Dimarzo 

## 2020-07-23 NOTE — Progress Notes (Signed)
ANTICOAGULATION CONSULT NOTE - Initial Consult  Pharmacy Consult for heparin gtt Indication: atrial fibrillation  Not on File  Patient Measurements: Height: 5\' 4"  (162.6 cm) Weight: 53.2 kg (117 lb 4.6 oz) IBW/kg (Calculated) : 59.2 Heparin Dosing Weight: 53.2kg   Vital Signs: Temp: 97.34 F (36.3 C) (05/23 0700) Temp Source: Esophageal (05/23 0400) BP: 116/82 (05/23 0700) Pulse Rate: 88 (05/23 0700)  Labs: Recent Labs    07/21/20 0109 07/21/20 0149 07/21/20 0429 07/21/20 0505 07/21/20 0547 07/22/20 0340 07/22/20 0846 07/22/20 2000 07/22/20 2002 07/23/20 0315 07/23/20 0325  HGB 13.8   < > 9.7*   < >  --   --  10.2* 8.5* 8.5* 8.8* 8.2*  HCT 42.3   < > 29.2*   < >  --   --  31.1* 25.6* 25.0* 25.8* 24.0*  PLT 216  --  205  --   --   --  208 206  --  196  --   APTT  --   --  134*  --  53*  --   --   --   --   --   --   LABPROT  --   --  16.9*  --  15.8*  --   --   --   --   --   --   INR  --   --  1.4*  --  1.3*  --   --   --   --   --   --   CREATININE 1.40*   < > 1.28*  --   --  1.27*  --  1.31*  --  1.23  --   TROPONINIHS 2,747*  --  9,770*  --   --   --   --   --   --   --   --    < > = values in this interval not displayed.    Estimated Creatinine Clearance: 44.5 mL/min (by C-G formula based on SCr of 1.23 mg/dL).  Assessment: 65 yo M admitted for vfib arrest with 71 CPR found to have STEMI now s/p PCI.  Now suspicious for development of new Afib with RVR and pharmacy consulted to dose heparin. With current info, patient is not on anticoagulation prior to admission.  Prophylaxis heparin 5000u SQ given at ~0500 this morning  CBC: Hgb 8.2, Plt 196  Goal of Therapy:  Heparin level 0.3-0.5 units/ml Monitor platelets by anticoagulation protocol: Yes   Plan:  Target low goal heparin 0.3-0.5 Heparin bolus x1 of 1300 units  Initiate heparin infusion at 700 units / hour 8hr HL at 1600  F/u s/sx bleeding, daily CBC, daily HL   , PharmD PGY1  Pharmacy Resident 07/23/2020 8:10 AM

## 2020-07-23 NOTE — Progress Notes (Signed)
AM K+ 3.4 with Creat 1.23 and GFR > 60. CCM ELink electrolyte protocol initiated.

## 2020-07-23 NOTE — Procedures (Signed)
Arterial Catheter Insertion Procedure Note  Katrell Milhorn  [id number]  [birth year]  Date:07/23/20  Time:5:50 PM    Provider Performing: Rutherford Guys    Procedure: Insertion of Arterial Line (89381) with US guidance (01751)   Indication(s) Blood pressure monitoring and/or need for frequent ABGs  Consent Risks of the procedure as well as the alternatives and risks of each were explained to the patient and/or caregiver.  Consent for the procedure was obtained and is signed in the bedside chart  Anesthesia None   Time Out Verified patient identification, verified procedure, site/side was marked, verified correct patient position, special equipment/implants available, medications/allergies/relevant history reviewed, required imaging and test results available.   Sterile Technique Maximal sterile technique including full sterile barrier drape, hand hygiene, sterile gown, sterile gloves, mask, hair covering, sterile ultrasound probe cover (if used).   Procedure Description Area of catheter insertion was cleaned with chlorhexidine and draped in sterile fashion. With real-time ultrasound guidance an arterial catheter was placed into the left radial artery.  Appropriate arterial tracings confirmed on monitor.     Complications/Tolerance None; patient tolerated the procedure well.   EBL Minimal   Specimen(s) None   Rutherford Guys, PA - [facility] For pager details, please see AMION or use Epic chat  After 1900, please call [facility] for cross coverage needs 07/23/2020, 5:51 PM

## 2020-07-23 NOTE — Progress Notes (Incomplete)
Nutrition Follow-up  DOCUMENTATION CODES:   Severe malnutrition in context of chronic illness  INTERVENTION:  ***   NUTRITION DIAGNOSIS:   Severe Malnutrition related to chronic illness as evidenced by severe fat depletion,severe muscle depletion.  ***  GOAL:   Patient will meet greater than or equal to 90% of their needs  ***  MONITOR:   Vent status,TF tolerance,Weight trends,Skin,Labs  REASON FOR ASSESSMENT:        ASSESSMENT:    65 yo male admitted with   ***   NUTRITION - FOCUSED PHYSICAL EXAM:  {RD Focused Exam List:21252}  Diet Order:   Diet Order            Diet NPO time specified  Diet effective now                 EDUCATION NEEDS:   Not appropriate for education at this time  Skin:     Last BM:     Height:   Ht Readings from Last 1 Encounters:  07/23/20 5' 4.02" (1.626 m)    Weight:   Wt Readings from Last 1 Encounters:  07/23/20 53.2 kg    Ideal Body Weight:     BMI:  Body mass index is 20.12 kg/m.  Estimated Nutritional Needs:   Kcal:     Protein:     Fluid:      Romelle Starcher MS, RDN, LDN, CNSC Registered Dietitian III Clinical Nutrition RD Pager and On-Call Pager Number Located in [place]

## 2020-07-24 ENCOUNTER — Other Ambulatory Visit: Payer: Self-pay

## 2020-07-24 ENCOUNTER — Encounter (HOSPITAL_COMMUNITY): Payer: Self-pay | Admitting: Specialist

## 2020-07-24 DIAGNOSIS — I2102 ST elevation (STEMI) myocardial infarction involving left anterior descending coronary artery: Secondary | ICD-10-CM | POA: Diagnosis not present

## 2020-07-24 DIAGNOSIS — R570 Cardiogenic shock: Secondary | ICD-10-CM

## 2020-07-24 DIAGNOSIS — I255 Ischemic cardiomyopathy: Secondary | ICD-10-CM

## 2020-07-24 DIAGNOSIS — K7200 Acute and subacute hepatic failure without coma: Secondary | ICD-10-CM

## 2020-07-24 DIAGNOSIS — J9601 Acute respiratory failure with hypoxia: Secondary | ICD-10-CM | POA: Diagnosis not present

## 2020-07-24 DIAGNOSIS — I501 Left ventricular failure: Secondary | ICD-10-CM

## 2020-07-24 DIAGNOSIS — J690 Pneumonitis due to inhalation of food and vomit: Secondary | ICD-10-CM

## 2020-07-24 DIAGNOSIS — R7401 Elevation of levels of liver transaminase levels: Secondary | ICD-10-CM

## 2020-07-24 DIAGNOSIS — N179 Acute kidney failure, unspecified: Secondary | ICD-10-CM

## 2020-07-24 DIAGNOSIS — I469 Cardiac arrest, cause unspecified: Secondary | ICD-10-CM | POA: Diagnosis not present

## 2020-07-24 LAB — COMPREHENSIVE METABOLIC PANEL
ALT: 165 U/L — ABNORMAL HIGH (ref 0–44)
AST: 292 U/L — ABNORMAL HIGH (ref 15–41)
Albumin: 2.3 g/dL — ABNORMAL LOW (ref 3.5–5.0)
Alkaline Phosphatase: 86 U/L (ref 38–126)
Anion gap: 5 (ref 5–15)
BUN: 36 mg/dL — ABNORMAL HIGH (ref 8–23)
CO2: 27 mmol/L (ref 22–32)
Calcium: 7.2 mg/dL — ABNORMAL LOW (ref 8.9–10.3)
Chloride: 106 mmol/L (ref 98–111)
Creatinine, Ser: 1.24 mg/dL (ref 0.61–1.24)
GFR, Estimated: >60 mL/min (ref >60)
Glucose, Bld: 138 mg/dL — ABNORMAL HIGH (ref 70–99)
Potassium: 3.9 mmol/L (ref 3.5–5.1)
Sodium: 138 mmol/L (ref 135–145)
Total Bilirubin: 0.9 mg/dL (ref 0.3–1.2)
Total Protein: 4.8 g/dL — ABNORMAL LOW (ref 6.5–8.1)

## 2020-07-24 LAB — COOXEMETRY PANEL
Carboxyhemoglobin: 0.5 % (ref 0.5–1.5)
Carboxyhemoglobin: 0.6 % (ref 0.5–1.5)
Methemoglobin: 0.9 % (ref 0.0–1.5)
Methemoglobin: 1.3 % (ref 0.0–1.5)
O2 Saturation: 51 %
O2 Saturation: 57.1 %
Total hemoglobin: 10.3 g/dL — ABNORMAL LOW (ref 12.0–16.0)
Total hemoglobin: 11 g/dL — ABNORMAL LOW (ref 12.0–16.0)

## 2020-07-24 LAB — CBC
HCT: 24 % — ABNORMAL LOW (ref 39.0–52.0)
Hemoglobin: 8.1 g/dL — ABNORMAL LOW (ref 13.0–17.0)
MCH: 31 pg (ref 26.0–34.0)
MCHC: 33.8 g/dL (ref 30.0–36.0)
MCV: 92 fL (ref 80.0–100.0)
Platelets: 225 10*3/uL (ref 150–400)
RBC: 2.61 MIL/uL — ABNORMAL LOW (ref 4.22–5.81)
RDW: 14.5 % (ref 11.5–15.5)
WBC: 16.9 10*3/uL — ABNORMAL HIGH (ref 4.0–10.5)
nRBC: 0 % (ref 0.0–0.2)

## 2020-07-24 LAB — GLUCOSE, CAPILLARY
Glucose-Capillary: 135 mg/dL — ABNORMAL HIGH (ref 70–99)
Glucose-Capillary: 138 mg/dL — ABNORMAL HIGH (ref 70–99)
Glucose-Capillary: 150 mg/dL — ABNORMAL HIGH (ref 70–99)
Glucose-Capillary: 169 mg/dL — ABNORMAL HIGH (ref 70–99)
Glucose-Capillary: 172 mg/dL — ABNORMAL HIGH (ref 70–99)
Glucose-Capillary: 173 mg/dL — ABNORMAL HIGH (ref 70–99)

## 2020-07-24 LAB — BASIC METABOLIC PANEL
Anion gap: 6 (ref 5–15)
BUN: 35 mg/dL — ABNORMAL HIGH (ref 8–23)
CO2: 25 mmol/L (ref 22–32)
Calcium: 7.5 mg/dL — ABNORMAL LOW (ref 8.9–10.3)
Chloride: 105 mmol/L (ref 98–111)
Creatinine, Ser: 1.25 mg/dL — ABNORMAL HIGH (ref 0.61–1.24)
GFR, Estimated: >60 mL/min (ref >60)
Glucose, Bld: 151 mg/dL — ABNORMAL HIGH (ref 70–99)
Potassium: 4 mmol/L (ref 3.5–5.1)
Sodium: 136 mmol/L (ref 135–145)

## 2020-07-24 LAB — POCT I-STAT 7, (LYTES, BLD GAS, ICA,H+H)
Acid-Base Excess: 4 mmol/L — ABNORMAL HIGH (ref 0.0–2.0)
Bicarbonate: 27.3 mmol/L (ref 20.0–28.0)
Calcium, Ion: 1.06 mmol/L — ABNORMAL LOW (ref 1.15–1.40)
HCT: 24 % — ABNORMAL LOW (ref 39.0–52.0)
Hemoglobin: 8.2 g/dL — ABNORMAL LOW (ref 13.0–17.0)
O2 Saturation: 91 %
Patient temperature: 37.5
Potassium: 4.1 mmol/L (ref 3.5–5.1)
Sodium: 140 mmol/L (ref 135–145)
TCO2: 28 mmol/L (ref 22–32)
pCO2 arterial: 35.9 mmHg (ref 32.0–48.0)
pH, Arterial: 7.491 — ABNORMAL HIGH (ref 7.350–7.450)
pO2, Arterial: 57 mmHg — ABNORMAL LOW (ref 83.0–108.0)

## 2020-07-24 LAB — PHOSPHORUS
Phosphorus: 2 mg/dL — ABNORMAL LOW (ref 2.5–4.6)
Phosphorus: 1.8 mg/dL — ABNORMAL LOW (ref 2.5–4.6)

## 2020-07-24 LAB — MAGNESIUM
Magnesium: 2.1 mg/dL (ref 1.7–2.4)
Magnesium: 1.9 mg/dL (ref 1.7–2.4)

## 2020-07-24 LAB — HEPARIN LEVEL (UNFRACTIONATED)
Heparin Unfractionated: 0.29 IU/mL — ABNORMAL LOW (ref 0.30–0.70)
Heparin Unfractionated: 0.48 IU/mL (ref 0.30–0.70)

## 2020-07-24 MED ORDER — POTASSIUM CHLORIDE 20 MEQ PO PACK
20.0000 meq | PACK | Freq: Once | ORAL | Status: DC
  Filled 2020-07-24: qty 1

## 2020-07-24 MED ORDER — FUROSEMIDE 10 MG/ML IJ SOLN
60.0000 mg | Freq: Once | INTRAMUSCULAR | Status: AC
  Administered 2020-07-24: 60 mg via INTRAVENOUS
  Filled 2020-07-24: qty 6

## 2020-07-24 MED ORDER — CALCIUM GLUCONATE-NACL 1-0.675 GM/50ML-% IV SOLN
1.0000 g | Freq: Once | INTRAVENOUS | Status: AC
  Administered 2020-07-24: 1000 mg via INTRAVENOUS
  Filled 2020-07-24 (×2): qty 50

## 2020-07-24 MED ORDER — POTASSIUM CHLORIDE 20 MEQ PO PACK
40.0000 meq | PACK | Freq: Once | ORAL | Status: AC
  Administered 2020-07-24: 40 meq
  Filled 2020-07-24: qty 2

## 2020-07-24 NOTE — Progress Notes (Addendum)
NAMEElija Horton, MRN:  [id number], DOB:  [birth year], LOS: 3 ADMISSION DATE:  07/21/2020, CONSULTATION DATE:  07/22/2019 REFERRING MD:  [place], CHIEF COMPLAINT:  Post arrest   History of Present Illness:  Patient is a 65 year old male with limited medical history found down by family, EMS was called, he was found to be in V. fib shocked 5 x 3 mg epinephrine 450 mg of amiodarone with ROSC obtained.  He was emergently taken to the Cath Lab showing ST segment changes in anterior lateral leads status post revascularization of LAD.  He was noted to have been moving spontaneously at time of cardiac catheterization.  Transferred to the ICU.  PCCM consulted for help with management  Pertinent  Medical History    has no past medical history on file.  Significant Hospital Events: Including procedures, antibiotic start and stop dates in addition to other pertinent events    5/21 Vfib arrest, Cardiac catheterization, TTM started, right IJ placed  Interim History / Subjective:  No acute issues overnight  Remains on high dose vasopressors   Objective   Blood pressure 103/69, pulse 84, temperature 99.32 F (37.4 C), resp. rate (!) 22, height 5' 4.02" (1.626 m), weight 55.1 kg, SpO2 100 %. CVP:  [7 mmHg-11 mmHg] 9 mmHg  Vent Mode: PRVC FiO2 (%):  [40 %-60 %] 40 % Set Rate:  [22 bmp] 22 bmp Vt Set:  [470 mL] 470 mL PEEP:  [5 cmH20] 5 cmH20 Pressure Support:  [10 cmH20] 10 cmH20 Plateau Pressure:  [20 cmH20-22 cmH20] 20 cmH20   Intake/Output Summary (Last 24 hours) at 07/24/2020 0720 Last data filed at 07/24/2020 0600 Gross per 24 hour  Intake 2760.24 ml  Output 3300 ml  Net -539.76 ml   Filed Weights   07/23/20 0500 07/23/20 0800 07/24/20 0500  Weight: 53.2 kg 53.2 kg 55.1 kg    Examination: General: Acute on chronic.  Middle-aged male lying in bed on mechanical ventilation in no acute distress  HEENT: ETT, MM pink/moist, PERRL,  Neuro: Sedated on ventilator, when sedation is lightened  patient is waking up frequently for ET tube CV: s1s2 regular rate and rhythm, no murmur, rubs, or gallops,  PULM: Faint bilateral rhonchi, tolerating ET tube well, no increased work of breathing GI: soft, bowel sounds active in all 4 quadrants, non-tender, non-distended, tolerating TF Extremities: warm/dry, no edema  Skin: no rashes or lesions  Labs/imaging that I have personally reviewed    Chest x-ray 5/21 with pulmonary edema  Head CT 5/22 negative for cerebral edema no evidence of anoxic injury  5/24 pertinent lab work: AST 292, ALT 165, WBC 16.9, hgb 8.1,   Resolved Hospital Problem list     Assessment & Plan:  Cardiac arrest resulting in development of cardiogenic shock -V. fib arrest associated with STEMI, patient shocked 5 times with 15 minutes of CPR prior to ROSC Status post STEMI post LAD stent, cardiogenic shock -Emergent cath completed 5/21 occluded proximal LAD status post DES.  Per heart failure DES embolized and again LAD is completely occluded Cardiomyopathy EF 30-35 -Echo 5/21 with EF 30 to 35%, coarse apical trabeculation and calcified LV apical false tendon, LV with regional wall motion abnormality and grade 1 diastolic dysfunction New onset atrial fibrillation P: Heart failure and cardiology following, appreciate assistance Continue Ticagrelor, ASA, and Statin  Coox 51 Continue to titrate inotropes and vasopressors Continue to diureses  Continuous telemetry  Strict intake and output  Monitor volume status   Remains on amiodarone  and heparin   Acute respiratory failure secondary to cardiac arrest, MI Right-sided aspiration pneumonia Pulmonary edema P: Continue ventilator support with lung protective strategies  Wean PEEP and FiO2 for sats greater than 90%. Head of bed elevated 30 degrees. Plateau pressures less than 30 cm H20.  Follow intermittent chest x-ray and ABG.   SAT/SBT as tolerated, mentation preclude extubation  Ensure adequate pulmonary  hygiene  Follow cultures  VAP bundle in place  PAD protocol Remains on empiric antibiotics   AKI, improving -Unknown baseline, creatinine continues to downtrend P: Follow renal function Monitor urine output Trend Bmet Avoid nephrotoxins Ensure adequate renal perfusion  Enteral hydration   Elevated transaminases secondary to shock liver -LFT initially improved but are slowly uptrending in the setting of continued cardiogenic shock  P: Continue to trend  Avoid hepatotoxins  Supportive care   Acute encephalopathy, metabolic -Secondary to cardiac arrest -In the setting of cardiac arrest concern for anoxic injury however CT head negative and morning of 5/23 patient is able to follow commands when spoken in native language P: Continuous telemetry stopped 5/23 Minimize sedation  Maintain neuro protective measures; goal for eurothermia, euglycemia, eunatermia, normoxia, and PCO2 goal of 35-40 Nutrition and bowel regiment  Seizure precautions  Aspirations precautions   Hypokalemia P: Supplement as needed Trend   At risk malnutrition  P: Continue tube feeds  Protein supplementation   Best practice   Diet:  NPO Pain/Anxiety/Delirium protocol (if indicated): Yes (RASS goal -1) VAP protocol (if indicated): Yes DVT prophylaxis: Subcutaneous Heparin and SCD GI prophylaxis: H2B Glucose control:  SSI No Central venous access:  Yes, and it is still needed Arterial line:  N/A Foley:  Yes, and it is still needed Mobility:  bed rest  PT consulted: N/A Last date of multidisciplinary goals of care discussion: Family updated at bedside 5/23 Code Status:  full code Disposition: ICU  Critical care time:    Performed by: Delfin Gant  Total critical care time: 40 minutes  Critical care time was exclusive of separately billable procedures and treating other patients.  Critical care was necessary to treat or prevent imminent or life-threatening deterioration.  Critical  care was time spent personally by me on the following activities: development of treatment plan with patient and/or surrogate as well as nursing, discussions with consultants, evaluation of patient's response to treatment, examination of patient, obtaining history from patient or surrogate, ordering and performing treatments and interventions, ordering and review of laboratory studies, ordering and review of radiographic studies, pulse oximetry and re-evaluation of patient's condition.  Delfin Gant, NP-C [facility] Personal contact information can be found on Amion  07/24/2020, 7:20 AM    PCCM:  This is a 65 year old male, found down at home, V. fib cardiac arrest, DCCV x5, 3 rounds of epi plus amiodarone, anterior STEMI, taken to left heart catheterization status post LAD stent.  Was admitted under cooling protocol.  Postarrest was awake following commands.  BP 116/74   Pulse 90   Temp 98.96 F (37.2 C)   Resp (!) 22   Ht 5' 4.02" (1.626 m)   Wt 55.1 kg   SpO2 98%   BMI 20.84 kg/m   General: Elderly male, thin, intubated critically ill mechanical life support HEENT: NCAT, tracking when sedation is lightened, Heart: Regular rate rhythm, S1-S2 Lungs: Bilateral ventilated breath sounds  Labs: Coox a 57 Sodium 138 Serum creatinine 1.24 White blood cell count 16.9 Hemoglobin 8.1  Assessment: Cardiac arrest Cardiogenic  shock Ischemic cardiomyopathy Anterior STEMI, LAD territory Atrial fibrillation Acute hypoxemic respiratory failure requiring intubation mechanical ventilation secondary to above  Plan: Adult mechanical vent protocol Wean PEEP and FiO2 as tolerated Remains on ticagrelor plus aspirin Continue statin plus amiodarone Continue heparin infusion Remains on antibiotics for possible aspiration Continue to wean vasopressors to maintain mean arterial pressure greater than 65 Goal to come off vasopressin today if possible.  This patient is  critically ill with multiple organ system failure; which, requires frequent high complexity decision making, assessment, support, evaluation, and titration of therapies. This was completed through the application of advanced monitoring technologies and extensive interpretation of multiple databases. During this encounter critical care time was devoted to patient care services described in this note for 32 minutes.  Josephine Igo, DO [facility] 07/24/2020 9:12 AM

## 2020-07-24 NOTE — Progress Notes (Signed)
ANTICOAGULATION CONSULT NOTE  Pharmacy Consult for heparin gtt Indication: atrial fibrillation  No Known Allergies  Patient Measurements: Height: 5' 4.02" (162.6 cm) Weight: 55.1 kg (121 lb 7.6 oz) IBW/kg (Calculated) : 59.24 Heparin Dosing Weight: 53.2kg   Vital Signs: Temp: 98.42 F (36.9 C) (05/24 1400) Temp Source: Esophageal (05/24 0400) BP: 122/74 (05/24 1400) Pulse Rate: 116 (05/24 1200)  Labs: Recent Labs    07/22/20 2000 07/22/20 2002 07/23/20 0315 07/23/20 0325 07/23/20 1548 07/24/20 0311 07/24/20 1406  HGB 8.5*   < > 8.8* 8.2*  --  8.1*  --   HCT 25.6*   < > 25.8* 24.0*  --  24.0*  --   PLT 206  --  196  --   --  225  --   HEPARINUNFRC  --   --   --   --  0.26* 0.29* 0.48  CREATININE 1.31*  --  1.23  --   --  1.24 1.25*   < > = values in this interval not displayed.    Estimated Creatinine Clearance: 45.3 mL/min (A) (by C-G formula based on SCr of 1.25 mg/dL (H)).  Assessment: 65 yo M admitted for vfib arrest with CPR found to have STEMI now s/p PCI. Now suspicious for development of new Afib with RVR and pharmacy consulted to dose heparin. With current info, patient is not on anticoagulation prior to admission.  -heparin level= 0.48, therapeutic on 900 units/hr -Hgb 8.1, Plt 225  Goal of Therapy:  Heparin level 0.3-0.5 units/ml Monitor platelets by anticoagulation protocol: Yes   Plan:  -Continue heparin to 900 units/hr -Daily heparin level and CBC -f/u s/sx bleeding  Calton Dach, PharmD PGY1 Pharmacy Resident 07/24/2020 3:17 PM

## 2020-07-24 NOTE — TOC Initial Note (Signed)
Transition of Care (TOC) - Initial/Assessment Note  Heart Failure   Patient Details  Name: Vincent Horton MRN: [id number] Date of Birth: 1955/05/06  Transition of Care [facility]) CM/SW Contact:    Kaira Stringfield, LCSWA Phone Number: 07/24/2020, 3:20 PM  Clinical Narrative:            CSW attempted to visit the patient at bedside to introduce self as the heart failure social worker and to complete a very brief SDOH screening with the patient to address social needs as needed however the patient was unresponsive and unable to engage in conversation at this time.   CSW will continue to follow for discharge needs and will check back with the patient at another time.           Barriers to Discharge: Continued Medical Work up   Patient Goals and CMS Choice        Expected Discharge Plan and Services   In-house Referral: Clinical Social Work                                            Prior Living Arrangements/Services                       Activities of Daily Living Home Assistive Devices/Equipment: None ADL Screening (condition at time of admission) Patient's cognitive ability adequate to safely complete daily activities?: Yes Is the patient deaf or have difficulty hearing?: No Does the patient have difficulty seeing, even when wearing glasses/contacts?: No Does the patient have difficulty concentrating, remembering, or making decisions?: No Patient able to express need for assistance with ADLs?: Yes Does the patient have difficulty dressing or bathing?: No Independently performs ADLs?: Yes (appropriate for developmental age) Does the patient have difficulty walking or climbing stairs?: No Weakness of Legs: None Weakness of Arms/Hands: None  Permission Sought/Granted                  Emotional Assessment Appearance:: Appears stated age Attitude/Demeanor/Rapport: Unable to Assess (patient on vent) Affect (typically observed): Unable to Assess (patient on  vent)     Psych Involvement: No (comment)  Admission diagnosis:  STEMI involving left anterior descending coronary artery (HCC) [I21.02] Cardiac arrest [facility]) [I46.9] Patient Active Problem List   Diagnosis Date Noted  . Acute hypoxemic respiratory failure (HCC) 07/24/2020  . Ischemic cardiomyopathy 07/24/2020  . Aspiration pneumonia (HCC) 07/24/2020  . Pulmonary edema cardiac cause (HCC) 07/24/2020  . Acute kidney injury (HCC) 07/24/2020  . Shock liver 07/24/2020  . Cardiogenic shock (HCC) 07/24/2020  . Transaminitis 07/24/2020  . Protein calorie malnutrition (HCC) 07/23/2020  . Acute ST elevation myocardial infarction (STEMI) due to occlusion of left anterior descending (LAD) coronary artery (HCC) 07/21/2020  . Cardiac arrest (HCC) 07/21/2020  . STEMI involving left anterior descending coronary artery (HCC) 07/21/2020   PCP:  No primary care provider on file. Pharmacy:   [id number] [place], [place] - [address] AT [facility] [address] [place] [address] Phone: [phone] Fax: [phone]     Social Determinants of Health (SDOH) Interventions    Readmission Risk Interventions No flowsheet data found.  Johari Bennetts, MSW, LCSWA [phone] Heart Failure Social Worker

## 2020-07-24 NOTE — Progress Notes (Signed)
eLink Physician-Brief Progress Note Patient Name: Vincent Horton DOB: 1955-10-03 MRN: [id number]   Date of Service  07/24/2020  HPI/Events of Note  Hypotension - BP = 103/50 with MAP = 64 by A-line. BP = 107/64 with MAP = 76 by cuff. Norepinephrine IV infusion at 40 mcg/min.   eICU Interventions  Plan: 1. Adjust Norepinephrine IV infusion titration goal to >= 60.      Intervention Category Major Interventions: Hypotension - evaluation and management  Skyler Dusing Dennard Nip 07/24/2020, 11:19 PM

## 2020-07-24 NOTE — Progress Notes (Addendum)
ANTICOAGULATION CONSULT NOTE  Pharmacy Consult for heparin gtt Indication: atrial fibrillation  No Known Allergies  Patient Measurements: Height: 5' 4.02" (162.6 cm) Weight: 55.1 kg (121 lb 7.6 oz) IBW/kg (Calculated) : 59.24 Heparin Dosing Weight: 53.2kg   Vital Signs: Temp: 99.32 F (37.4 C) (05/24 0600) Temp Source: Esophageal (05/24 0400) BP: 103/69 (05/24 0600) Pulse Rate: 84 (05/23 2324)  Labs: Recent Labs    07/22/20 2000 07/22/20 2002 07/23/20 0315 07/23/20 0325 07/23/20 1548 07/24/20 0311  HGB 8.5*   < > 8.8* 8.2*  --  8.1*  HCT 25.6*   < > 25.8* 24.0*  --  24.0*  PLT 206  --  196  --   --  225  HEPARINUNFRC  --   --   --   --  0.26* 0.29*  CREATININE 1.31*  --  1.23  --   --  1.24   < > = values in this interval not displayed.    Estimated Creatinine Clearance: 45.7 mL/min (by C-G formula based on SCr of 1.24 mg/dL).  Assessment: 65 yo M admitted for vfib arrest with CPR found to have STEMI now s/p PCI. Now suspicious for development of new Afib with RVR and pharmacy consulted to dose heparin. With current info, patient is not on anticoagulation prior to admission.  -heparin level= 0.29, subtherapeutic on 800 units/hr   Goal of Therapy:  Heparin level 0.3-0.5 units/ml Monitor platelets by anticoagulation protocol: Yes   Plan:  -Increase heparin to 900 units/hr -f/u 8 hr HL at 1600  -Daily heparin level and CBC -f/u s/sx bleeding  Calton Dach, PharmD PGY1 Pharmacy Resident 07/24/2020 6:57 AM

## 2020-07-24 NOTE — Progress Notes (Signed)
Patient ID: Vincent Horton, male   DOB: 07-11-1955, 65 y.o.   MRN: [id number] P    Advanced Heart Failure Rounding Note  PCP-Cardiologist: None   Subjective:    This morning, NSR in 90s.  I/Os net negative -540 with 1 dose IV Lasix.  CVP 7.  Co-ox 51% early am, on NE 30, milrinone 0.375, vasopressin 0.04.  SBP 120s.  He remains on amiodarone gtt + heparin gtt.     Per nursing, he wakes up and will follow commands but gets agitated. Does not speak [place].  Zosyn for possible aspiration PNA.   EEG with severe diffuse encephalopathy, no seizures. CT head with chronic microvascular changes.   Echo: EF 30-35%, no LV thrombus, LAD territory WMAs, RV normal, IVC normal.    Objective:   Weight Range: 55.1 kg Body mass index is 20.84 kg/m.   Vital Signs:   Temp:  [96.6 F (35.9 C)-99.5 F (37.5 C)] 99.32 F (37.4 C) (05/24 0600) Pulse Rate:  [83-107] 84 (05/23 2324) Resp:  [17-24] 22 (05/24 0600) BP: (68-150)/(47-97) 103/69 (05/24 0600) SpO2:  [86 %-100 %] 100 % (05/24 0400) Arterial Line BP: (65-107)/(51-92) 98/53 (05/24 0600) FiO2 (%):  [40 %-60 %] 40 % (05/24 0400) Weight:  [53.2 kg-55.1 kg] 55.1 kg (05/24 0500) Last BM Date:  (PTA)  Weight change: Filed Weights   07/23/20 0500 07/23/20 0800 07/24/20 0500  Weight: 53.2 kg 53.2 kg 55.1 kg    Intake/Output:   Intake/Output Summary (Last 24 hours) at 07/24/2020 0739 Last data filed at 07/24/2020 0600 Gross per 24 hour  Intake 2760.24 ml  Output 3300 ml  Net -539.76 ml      Physical Exam    General: Intubated/sedated. Neck: No JVD, no thyromegaly or thyroid nodule.  Lungs: Rhonchi bilaterally CV: Nondisplaced PMI.  Heart regular S1/S2, no S3/S4, no murmur.  No peripheral edema.  N Abdomen: Soft, nontender, no hepatosplenomegaly, no distention.  Skin: Intact without lesions or rashes.  Neurologic: Sedated on vent Extremities: No clubbing or cyanosis.  HEENT: Normal.    Telemetry   NSR 90s (personally  reviewed)   Labs    CBC Recent Labs    07/23/20 0315 07/23/20 0325 07/24/20 0311  WBC 20.8*  --  16.9*  HGB 8.8* 8.2* 8.1*  HCT 25.8* 24.0* 24.0*  MCV 91.8  --  92.0  PLT 196  --  837   Basic Metabolic Panel Recent Labs    07/23/20 0315 07/23/20 0325 07/23/20 1548 07/24/20 0311  NA 138 139  --  138  K 3.4* 3.3*  --  3.9  CL 102  --   --  106  CO2 28  --   --  27  GLUCOSE 157*  --   --  138*  BUN 35*  --   --  36*  CREATININE 1.23  --   --  1.24  CALCIUM 7.1*  --   --  7.2*  MG  --   --  1.9 2.1  PHOS  --   --  3.1 2.0*   Liver Function Tests Recent Labs    07/23/20 0315 07/24/20 0311  AST 262* 292*  ALT 158* 165*  ALKPHOS 66 86  BILITOT 1.0 0.9  PROT 4.8* 4.8*  ALBUMIN 2.3* 2.3*   No results for input(s): LIPASE, AMYLASE in the last 72 hours. Cardiac Enzymes No results for input(s): CKTOTAL, CKMB, CKMBINDEX, TROPONINI in the last 72 hours.  BNP: BNP (last 3 results) No results for  input(s): BNP in the last 8760 hours.  ProBNP (last 3 results) No results for input(s): PROBNP in the last 8760 hours.   D-Dimer No results for input(s): DDIMER in the last 72 hours. Hemoglobin A1C No results for input(s): HGBA1C in the last 72 hours. Fasting Lipid Panel No results for input(s): CHOL, HDL, LDLCALC, TRIG, CHOLHDL, LDLDIRECT in the last 72 hours. Thyroid Function Tests No results for input(s): TSH, T4TOTAL, T3FREE, THYROIDAB in the last 72 hours.  Invalid input(s): FREET3  Other results:   Imaging    DG CHEST PORT 1 VIEW  Result Date: 07/23/2020 CLINICAL DATA:  Recent cardiac arrest EXAM: PORTABLE CHEST 1 VIEW COMPARISON:  07/21/2020 FINDINGS: Cardiac shadow is stable. Endotracheal tube, gastric catheter and right-sided jugular central line are again seen and stable. Small effusions are noted bilaterally. Hazy changes particularly in the right lung are again seen but somewhat improved when compare with the prior exam consistent with mild pulmonary  edema. Left retrocardiac consolidation is again noted and stable. IMPRESSION: Slight improved aeration when compared with the prior exam. Tubes and lines as described. Electronically Signed   By: Inez Catalina M.D.   On: 07/23/2020 09:33   DG Abd Portable 1V  Result Date: 07/23/2020 CLINICAL DATA:  Check feeding catheter placement EXAM: PORTABLE ABDOMEN - 1 VIEW COMPARISON:  None. FINDINGS: Scattered large and small bowel gas is noted. Feeding catheter is noted within the distal stomach. IMPRESSION: Feeding catheter in the distal stomach. Electronically Signed   By: Inez Catalina M.D.   On: 07/23/2020 12:00     Medications:     Scheduled Medications: . aspirin  81 mg Oral Daily  . aspirin  300 mg Rectal Once  . chlorhexidine gluconate (MEDLINE KIT)  15 mL Mouth Rinse BID  . Chlorhexidine Gluconate Cloth  6 each Topical Daily  . docusate  100 mg Per Tube BID  . fentaNYL (SUBLIMAZE) injection  25 mcg Intravenous Once  . furosemide  60 mg Intravenous Once  . insulin aspart  0-15 Units Subcutaneous Q4H  . mouth rinse  15 mL Mouth Rinse 10 times per day  . polyethylene glycol  17 g Per Tube Daily  . potassium chloride  20 mEq Per Tube Once  . potassium chloride  40 mEq Per Tube Once  . rosuvastatin  40 mg Oral Daily  . sodium chloride flush  10-40 mL Intracatheter Q12H  . sodium chloride flush  3 mL Intravenous Q12H  . ticagrelor  90 mg Oral BID    Infusions: . sodium chloride Stopped (07/21/20 0707)  . sodium chloride    . sodium chloride    . sodium chloride    . amiodarone 30 mg/hr (07/24/20 0600)  . calcium gluconate    . dexmedetomidine (PRECEDEX) IV infusion 1.2 mcg/kg/hr (07/24/20 0600)  . famotidine (PEPCID) IV Stopped (07/23/20 2212)  . feeding supplement (VITAL AF 1.2 CAL) 30 mL/hr at 07/23/20 2241  . fentaNYL infusion INTRAVENOUS 100 mcg/hr (07/24/20 0600)  . heparin 900 Units/hr (07/24/20 0727)  . milrinone 0.375 mcg/kg/min (07/24/20 0600)  . norepinephrine (LEVOPHED)  Adult infusion 30 mcg/min (07/24/20 0600)  . piperacillin-tazobactam (ZOSYN)  IV 12.5 mL/hr at 07/24/20 0600  . vasopressin 0.04 Units/min (07/24/20 0600)    PRN Medications: sodium chloride, Place/Maintain arterial line **AND** sodium chloride, acetaminophen, fentaNYL, midazolam, midazolam, ondansetron (ZOFRAN) IV, sodium chloride flush, sodium chloride flush    Assessment/Plan   1. CAD: Anterior STEMI with occluded proximal LAD.  Treated with DES but complicated  by embolization to distal LAD (procedure completed with occluded distal LAD).    - Continue ticagrelor and ASA 81.  - Crestor 40 daily. 2. Cardiogenic shock: Ischemic cardiomyopathy. Echo with EF 30-35%, no LV thrombus, LAD territory WMAs, RV normal, IVC normal. Currently on NE 30, milrinone 0.375, vasopressin 0.04.  Co-ox 51%, early am.  CVP 7 with I/Os negative.  SBP 120s.  - Continue milrinone 0.375 today and repeat co-ox.   - Wean off vasopressin this morning (stable MAP), then can slowly lower NE. - Lasix 60 mg IV x 1 bolus this morning with KCl.  3. Acute hypoxemic respiratory failure: Aspiration PNA (lower lobe opacities on CXR) + pulmonary edema. WBCs 21 => 16.9.  - On Zosyn per CCM.  - Vent per CCM.  4. Cardiac arrest: VF arrest associated with STEMI.  Patient shocked x 5, 15 min CPR before ROSC.  No further VT.  Now rewarmed s/p hypothermia protocol.  5. Neuro: EEG with severe diffuse encephalopathy.  CT head showed chronic microvascular changes.  Wakes up agitated, does have purposeful motion (does not speak [place]).  - Wean sedation with family around.  6. Anemia: Hgb 8.1, follow closely.  - Transfuse < 8.  7. Atrial fibrillation: Difficult to tell if truly AF with RVR (there was lots of artifact on telemetry and ECGs), may have been ST with PACs.  Currently in NSR.  - For now, will use heparin gtt. If no AF over the next day, stop heparin gtt.  - Amiodarone gtt 30.    CRITICAL CARE Performed by: Loralie Champagne  Total critical care time: 40 minutes  Critical care time was exclusive of separately billable procedures and treating other patients.  Critical care was necessary to treat or prevent imminent or life-threatening deterioration.  Critical care was time spent personally by me on the following activities: development of treatment plan with patient and/or surrogate as well as nursing, discussions with consultants, evaluation of patient's response to treatment, examination of patient, obtaining history from patient or surrogate, ordering and performing treatments and interventions, ordering and review of laboratory studies, ordering and review of radiographic studies, pulse oximetry and re-evaluation of patient's condition.  Loralie Champagne 07/24/2020 7:39 AM   Length of Stay: 3  Loralie Champagne, MD  07/24/2020, 7:39 AM  Advanced Heart Failure Team Pager [phone] (M-F; 7a - 5p)  Please contact [facility] for night-coverage after hours (5p -7a ) and weekends on [email or web address]

## 2020-07-25 ENCOUNTER — Encounter (HOSPITAL_COMMUNITY): Payer: Self-pay | Admitting: Specialist

## 2020-07-25 ENCOUNTER — Inpatient Hospital Stay (HOSPITAL_COMMUNITY): Payer: Medicare Other

## 2020-07-25 DIAGNOSIS — E43 Unspecified severe protein-calorie malnutrition: Secondary | ICD-10-CM | POA: Insufficient documentation

## 2020-07-25 DIAGNOSIS — I2102 ST elevation (STEMI) myocardial infarction involving left anterior descending coronary artery: Secondary | ICD-10-CM | POA: Diagnosis not present

## 2020-07-25 DIAGNOSIS — J9601 Acute respiratory failure with hypoxia: Secondary | ICD-10-CM | POA: Diagnosis not present

## 2020-07-25 LAB — COMPREHENSIVE METABOLIC PANEL
ALT: 168 U/L — ABNORMAL HIGH (ref 0–44)
AST: 318 U/L — ABNORMAL HIGH (ref 15–41)
Albumin: 2.1 g/dL — ABNORMAL LOW (ref 3.5–5.0)
Alkaline Phosphatase: 122 U/L (ref 38–126)
Anion gap: 7 (ref 5–15)
BUN: 34 mg/dL — ABNORMAL HIGH (ref 8–23)
CO2: 25 mmol/L (ref 22–32)
Calcium: 7.4 mg/dL — ABNORMAL LOW (ref 8.9–10.3)
Chloride: 106 mmol/L (ref 98–111)
Creatinine, Ser: 1.31 mg/dL — ABNORMAL HIGH (ref 0.61–1.24)
GFR, Estimated: >60 mL/min (ref >60)
Glucose, Bld: 171 mg/dL — ABNORMAL HIGH (ref 70–99)
Potassium: 3.7 mmol/L (ref 3.5–5.1)
Sodium: 138 mmol/L (ref 135–145)
Total Bilirubin: 0.7 mg/dL (ref 0.3–1.2)
Total Protein: 4.7 g/dL — ABNORMAL LOW (ref 6.5–8.1)

## 2020-07-25 LAB — COOXEMETRY PANEL
Carboxyhemoglobin: 0.9 % (ref 0.5–1.5)
Carboxyhemoglobin: 0.8 % (ref 0.5–1.5)
Methemoglobin: 1.1 % (ref 0.0–1.5)
Methemoglobin: 0.7 % (ref 0.0–1.5)
O2 Saturation: 54.5 %
O2 Saturation: 52.6 %
Total hemoglobin: 6.6 g/dL — CL (ref 12.0–16.0)
Total hemoglobin: 7.5 g/dL — ABNORMAL LOW (ref 12.0–16.0)

## 2020-07-25 LAB — GLUCOSE, CAPILLARY
Glucose-Capillary: 173 mg/dL — ABNORMAL HIGH (ref 70–99)
Glucose-Capillary: 172 mg/dL — ABNORMAL HIGH (ref 70–99)
Glucose-Capillary: 138 mg/dL — ABNORMAL HIGH (ref 70–99)
Glucose-Capillary: 141 mg/dL — ABNORMAL HIGH (ref 70–99)
Glucose-Capillary: 177 mg/dL — ABNORMAL HIGH (ref 70–99)
Glucose-Capillary: 151 mg/dL — ABNORMAL HIGH (ref 70–99)

## 2020-07-25 LAB — CBC
HCT: 27.9 % — ABNORMAL LOW (ref 39.0–52.0)
Hemoglobin: 9.4 g/dL — ABNORMAL LOW (ref 13.0–17.0)
MCH: 31.8 pg (ref 26.0–34.0)
MCHC: 33.7 g/dL (ref 30.0–36.0)
MCV: 94.3 fL (ref 80.0–100.0)
Platelets: 181 10*3/uL (ref 150–400)
RBC: 2.96 MIL/uL — ABNORMAL LOW (ref 4.22–5.81)
RDW: 14.9 % (ref 11.5–15.5)
WBC: 14.1 10*3/uL — ABNORMAL HIGH (ref 4.0–10.5)
nRBC: 0.1 % (ref 0.0–0.2)

## 2020-07-25 LAB — HEPARIN LEVEL (UNFRACTIONATED): Heparin Unfractionated: 0.39 IU/mL (ref 0.30–0.70)

## 2020-07-25 MED ORDER — MIDAZOLAM HCL 2 MG/2ML IJ SOLN
0.5000 mg | INTRAMUSCULAR | Status: DC | PRN
  Administered 2020-07-25 (×3): 1 mg via INTRAVENOUS
  Administered 2020-07-25: 2 mg via INTRAVENOUS
  Administered 2020-07-25 – 2020-07-26 (×4): 1 mg via INTRAVENOUS
  Administered 2020-07-26: 2 mg via INTRAVENOUS
  Administered 2020-07-26 (×3): 1 mg via INTRAVENOUS
  Administered 2020-07-27 (×2): 2 mg via INTRAVENOUS
  Filled 2020-07-25 (×13): qty 2

## 2020-07-25 MED ORDER — HYDROMORPHONE BOLUS VIA INFUSION
0.2500 mg | INTRAVENOUS | Status: DC | PRN
  Administered 2020-07-25 – 2020-07-27 (×2): 1 mg via INTRAVENOUS
  Administered 2020-07-27 (×2): 0.25 mg via INTRAVENOUS
  Administered 2020-07-27: 0.5 mg via INTRAVENOUS
  Administered 2020-07-27 – 2020-07-28 (×2): 1 mg via INTRAVENOUS
  Filled 2020-07-25: qty 1

## 2020-07-25 MED ORDER — SODIUM CHLORIDE 0.9 % IV SOLN
0.5000 mg/h | INTRAVENOUS | Status: DC
  Administered 2020-07-25: 0.5 mg/h via INTRAVENOUS
  Administered 2020-07-26: 4 mg/h via INTRAVENOUS
  Administered 2020-07-29: 1.5 mg/h via INTRAVENOUS
  Administered 2020-07-30: 1.25 mg/h via INTRAVENOUS
  Filled 2020-07-25 (×6): qty 5

## 2020-07-25 MED ORDER — FUROSEMIDE 10 MG/ML IJ SOLN
60.0000 mg | Freq: Once | INTRAMUSCULAR | Status: AC
  Administered 2020-07-25: 60 mg via INTRAVENOUS
  Filled 2020-07-25: qty 6

## 2020-07-25 MED ORDER — ENOXAPARIN SODIUM 40 MG/0.4ML IJ SOSY
40.0000 mg | PREFILLED_SYRINGE | Freq: Every day | INTRAMUSCULAR | Status: DC
  Administered 2020-07-25 – 2020-07-28 (×4): 40 mg via SUBCUTANEOUS
  Filled 2020-07-25 (×4): qty 0.4

## 2020-07-25 MED ORDER — MIDAZOLAM HCL 2 MG/2ML IJ SOLN
0.5000 mg | INTRAMUSCULAR | Status: DC | PRN
  Administered 2020-07-25: 1 mg via INTRAVENOUS
  Filled 2020-07-25 (×2): qty 2

## 2020-07-25 MED ORDER — HYDROMORPHONE HCL 1 MG/ML IJ SOLN
0.5000 mg | Freq: Once | INTRAMUSCULAR | Status: DC
Start: 2020-07-25 — End: 2020-07-30

## 2020-07-25 MED ORDER — POTASSIUM CHLORIDE 20 MEQ PO PACK
40.0000 meq | PACK | Freq: Once | ORAL | Status: AC
  Administered 2020-07-25: 40 meq
  Filled 2020-07-25: qty 2

## 2020-07-25 MED ORDER — AMIODARONE HCL 200 MG PO TABS
200.0000 mg | ORAL_TABLET | Freq: Two times a day (BID) | ORAL | Status: DC
  Administered 2020-07-25 (×2): 200 mg via ORAL
  Filled 2020-07-25 (×2): qty 1

## 2020-07-25 NOTE — Progress Notes (Signed)
ANTICOAGULATION CONSULT NOTE  Pharmacy Consult for heparin gtt Indication: atrial fibrillation  No Known Allergies  Patient Measurements: Height: 5' 4.02" (162.6 cm) Weight: 55.8 kg (123 lb 0.3 oz) IBW/kg (Calculated) : 59.24 Heparin Dosing Weight: 53.2kg   Vital Signs: Temp: 98.78 F (37.1 C) (05/25 0500) Temp Source: Esophageal (05/25 0400) BP: 101/59 (05/25 0500) Pulse Rate: 102 (05/25 0325)  Labs: Recent Labs    07/23/20 0315 07/23/20 0325 07/24/20 0311 07/24/20 1406 07/24/20 2028 07/25/20 0327  HGB 8.8*   < > 8.1*  --  8.2* 9.4*  HCT 25.8*   < > 24.0*  --  24.0* 27.9*  PLT 196  --  225  --   --  181  HEPARINUNFRC  --    < > 0.29* 0.48  --  0.39  CREATININE 1.23  --  1.24 1.25*  --  1.31*   < > = values in this interval not displayed.    Estimated Creatinine Clearance: 43.8 mL/min (A) (by C-G formula based on SCr of 1.31 mg/dL (H)).  Assessment: 65 yo M admitted for vfib arrest with CPR found to have STEMI now s/p PCI. Now suspicious for development of new Afib with RVR and pharmacy consulted to dose heparin. With current info, patient is not on anticoagulation prior to admission.  -heparin level= 0.39, therapeutic on 900 units/hr -Hgb 9.4, Plt 181  Goal of Therapy:  Heparin level 0.3-0.5 units/ml Monitor platelets by anticoagulation protocol: Yes   Plan:  -Continue heparin to 900 units/hr -Daily heparin level and CBC -f/u s/sx bleeding -f/u transition off of anticoagulation +/- presence of afib on telemetry   Calton Dach, PharmD PGY1 Pharmacy Resident 07/25/2020 6:15 AM

## 2020-07-25 NOTE — Progress Notes (Signed)
Patient ID: Vincent Horton, male   DOB: 1955-07-18, 65 y.o.   MRN: [id number] P    Advanced Heart Failure Rounding Note  PCP-Cardiologist: None   Subjective:    This morning, NSR in 100s-110s (agitated).  I/Os net negative -980 with 1 dose IV Lasix.  CVP 10.  Co-ox 55% early am, on NE 30, milrinone 0.375, vasopressin 0.04.  BP low currently but was agitated and just got Versed.  He remains on amiodarone gtt + heparin gtt.     Per nursing, he wakes up and will follow commands but gets agitated. Does not speak [place].  Zosyn for possible aspiration PNA.   EEG with severe diffuse encephalopathy, no seizures. CT head with chronic microvascular changes.   Echo: EF 30-35%, no LV thrombus, LAD territory WMAs, RV normal, IVC normal.    Objective:   Weight Range: 55.8 kg Body mass index is 21.11 kg/m.   Vital Signs:   Temp:  [98.06 F (36.7 C)-100.04 F (37.8 C)] 98.78 F (37.1 C) (05/25 0600) Pulse Rate:  [86-153] 102 (05/25 0325) Resp:  [20-26] 22 (05/25 0600) BP: (75-175)/(42-105) 97/59 (05/25 0600) SpO2:  [96 %-100 %] 100 % (05/25 0400) Arterial Line BP: (89-194)/(47-80) 99/52 (05/25 0600) FiO2 (%):  [30 %] 30 % (05/25 0400) Weight:  [55.8 kg] 55.8 kg (05/25 0307) Last BM Date:  (PTA)  Weight change: Filed Weights   07/23/20 0800 07/24/20 0500 07/25/20 0307  Weight: 53.2 kg 55.1 kg 55.8 kg    Intake/Output:   Intake/Output Summary (Last 24 hours) at 07/25/2020 0739 Last data filed at 07/25/2020 0600 Gross per 24 hour  Intake 3154.87 ml  Output 4135 ml  Net -980.13 ml      Physical Exam    General: Intubated/sedated.  Neck: JVP 8-9 cm, no thyromegaly or thyroid nodule.  Lungs: Clear to auscultation bilaterally with normal respiratory effort. CV: Nondisplaced PMI.  Heart mildly tachy, regular S1/S2, no S3/S4, no murmur.  No peripheral edema.   Abdomen: Soft, nontender, no hepatosplenomegaly, no distention.  Skin: Intact without lesions or rashes.  Neurologic: Sedated  but will wake up and follow commands per nursing. Extremities: No clubbing or cyanosis.  HEENT: Normal.    Telemetry   NSR 100s with PACs (personally reviewed)   Labs    CBC Recent Labs    07/24/20 0311 07/24/20 2028 07/25/20 0327  WBC 16.9*  --  14.1*  HGB 8.1* 8.2* 9.4*  HCT 24.0* 24.0* 27.9*  MCV 92.0  --  94.3  PLT 225  --  643   Basic Metabolic Panel Recent Labs    07/24/20 0311 07/24/20 1406 07/24/20 2028 07/25/20 0327  NA 138 136 140 138  K 3.9 4.0 4.1 3.7  CL 106 105  --  106  CO2 27 25  --  25  GLUCOSE 138* 151*  --  171*  BUN 36* 35*  --  34*  CREATININE 1.24 1.25*  --  1.31*  CALCIUM 7.2* 7.5*  --  7.4*  MG 2.1 1.9  --   --   PHOS 2.0* 1.8*  --   --    Liver Function Tests Recent Labs    07/24/20 0311 07/25/20 0327  AST 292* 318*  ALT 165* 168*  ALKPHOS 86 122  BILITOT 0.9 0.7  PROT 4.8* 4.7*  ALBUMIN 2.3* 2.1*   No results for input(s): LIPASE, AMYLASE in the last 72 hours. Cardiac Enzymes No results for input(s): CKTOTAL, CKMB, CKMBINDEX, TROPONINI in the last 72  hours.  BNP: BNP (last 3 results) No results for input(s): BNP in the last 8760 hours.  ProBNP (last 3 results) No results for input(s): PROBNP in the last 8760 hours.   D-Dimer No results for input(s): DDIMER in the last 72 hours. Hemoglobin A1C No results for input(s): HGBA1C in the last 72 hours. Fasting Lipid Panel No results for input(s): CHOL, HDL, LDLCALC, TRIG, CHOLHDL, LDLDIRECT in the last 72 hours. Thyroid Function Tests No results for input(s): TSH, T4TOTAL, T3FREE, THYROIDAB in the last 72 hours.  Invalid input(s): FREET3  Other results:   Imaging    No results found.   Medications:     Scheduled Medications: . aspirin  81 mg Oral Daily  . aspirin  300 mg Rectal Once  . chlorhexidine gluconate (MEDLINE KIT)  15 mL Mouth Rinse BID  . Chlorhexidine Gluconate Cloth  6 each Topical Daily  . docusate  100 mg Per Tube BID  . fentaNYL  (SUBLIMAZE) injection  25 mcg Intravenous Once  . insulin aspart  0-15 Units Subcutaneous Q4H  . mouth rinse  15 mL Mouth Rinse 10 times per day  . polyethylene glycol  17 g Per Tube Daily  . potassium chloride  40 mEq Per Tube Once  . rosuvastatin  40 mg Oral Daily  . sodium chloride flush  10-40 mL Intracatheter Q12H  . sodium chloride flush  3 mL Intravenous Q12H  . ticagrelor  90 mg Oral BID    Infusions: . sodium chloride Stopped (07/21/20 0707)  . sodium chloride    . sodium chloride    . sodium chloride    . amiodarone 30 mg/hr (07/25/20 0500)  . dexmedetomidine (PRECEDEX) IV infusion 1.2 mcg/kg/hr (07/25/20 0739)  . famotidine (PEPCID) IV Stopped (07/24/20 2152)  . feeding supplement (VITAL AF 1.2 CAL) 1,000 mL (07/24/20 1330)  . fentaNYL infusion INTRAVENOUS 200 mcg/hr (07/25/20 0500)  . heparin 900 Units/hr (07/25/20 0500)  . milrinone 0.375 mcg/kg/min (07/25/20 0500)  . norepinephrine (LEVOPHED) Adult infusion 30 mcg/min (07/25/20 0550)  . piperacillin-tazobactam (ZOSYN)  IV 3.375 g (07/25/20 0509)  . vasopressin 0.04 Units/min (07/25/20 0500)    PRN Medications: sodium chloride, Place/Maintain arterial line **AND** sodium chloride, acetaminophen, fentaNYL, midazolam, midazolam, ondansetron (ZOFRAN) IV, sodium chloride flush, sodium chloride flush    Assessment/Plan   1. CAD: Anterior STEMI with occluded proximal LAD.  Treated with DES but complicated by embolization to distal LAD (procedure completed with occluded distal LAD).    - Continue ticagrelor and ASA 81.  - Crestor 40 daily. 2. Cardiogenic shock: Ischemic cardiomyopathy. Echo with EF 30-35%, no LV thrombus, LAD territory WMAs, RV normal, IVC normal. Currently on NE 30, milrinone 0.375, vasopressin 0.04.  Co-ox 55%, early am.  CVP 10 with I/Os negative.    - Continue milrinone 0.375 today and repeat co-ox.   - Wean down vasopressin this morning (stable MAP), then can slowly lower NE. - Lasix 60 mg IV x 1  bolus again this morning with KCl.  3. Acute hypoxemic respiratory failure: Aspiration PNA (lower lobe opacities on CXR) + pulmonary edema. WBCs 21 => 16.9 => 14. Tm 100.   - On Zosyn per CCM.  - Vent per CCM.  4. Cardiac arrest: VF arrest associated with STEMI. Patient shocked x 5, 15 min CPR before ROSC.  No further VT.  Now rewarmed s/p hypothermia protocol.  He has been revascularized.  - Lifevest if recovers to assess for recovery of EF.  5. Neuro: EEG with  severe diffuse encephalopathy.  CT head showed chronic microvascular changes.  Wakes up agitated, does have purposeful motion (does not speak [place]).  - Wean sedation with family around.  6. Anemia: Hgb 9.4, follow closely.  - Transfuse < 8.  7. Atrial fibrillation: I have not seen any actual atrial fibrillation, has had ST with PACs.  Currently in NSR.  - Think we can stop heparin gtt and make amiodarone po.    CRITICAL CARE Performed by: Loralie Champagne  Total critical care time: 40 minutes  Critical care time was exclusive of separately billable procedures and treating other patients.  Critical care was necessary to treat or prevent imminent or life-threatening deterioration.  Critical care was time spent personally by me on the following activities: development of treatment plan with patient and/or surrogate as well as nursing, discussions with consultants, evaluation of patient's response to treatment, examination of patient, obtaining history from patient or surrogate, ordering and performing treatments and interventions, ordering and review of laboratory studies, ordering and review of radiographic studies, pulse oximetry and re-evaluation of patient's condition.  Loralie Champagne 07/25/2020 7:39 AM   Length of Stay: 4  Loralie Champagne, MD  07/25/2020, 7:39 AM  Advanced Heart Failure Team Pager [phone] (M-F; 7a - 5p)  Please contact [facility] for night-coverage after hours (5p -7a ) and weekends on [email or web address]

## 2020-07-25 NOTE — Progress Notes (Signed)
eLink Physician-Brief Progress Note Patient Name: Vincent Horton DOB: November 08, 1955 MRN: [id number]   Date of Service  07/25/2020  HPI/Events of Note  Agitation - Patient settled down with Fentanyl IV.   eICU Interventions  Plan: 1. Increase ceiling on Precedex IV infusion to 1.6 mcg/kg/hour. 2. Increase ceiling on Fentanyl IV infusion to 300 mcg/hour.      Intervention Category Major Interventions: Delirium, psychosis, severe agitation - evaluation and management  [name] 07/25/2020, 12:10 AM

## 2020-07-25 NOTE — Progress Notes (Addendum)
NAMELynden Horton, MRN:  [id number], DOB:  06-20-1955, LOS: 4 ADMISSION DATE:  07/21/2020, CONSULTATION DATE:  07/22/2019 REFERRING MD:  [place], CHIEF COMPLAINT:  Post arrest   History of Present Illness:  Patient is a 65 year old male with limited medical history found down by family, EMS was called, he was found to be in V. fib shocked 5 x 3 mg epinephrine 450 mg of amiodarone with ROSC obtained.  He was emergently taken to the Cath Lab showing ST segment changes in anterior lateral leads status post revascularization of LAD.  He was noted to have been moving spontaneously at time of cardiac catheterization.  Transferred to the ICU.  PCCM consulted for help with management  Pertinent  Medical History    has no past medical history on file.  Significant Hospital Events: Including procedures, antibiotic start and stop dates in addition to other pertinent events    5/21 Vfib arrest, Cardiac catheterization, TTM started, right IJ placed  Interim History / Subjective:  Issues with sedation overnight Remains on significant vasopressor support  Objective   Blood pressure (!) 97/59, pulse (!) 102, temperature 98.78 F (37.1 C), resp. rate (!) 22, height 5' 4.02" (1.626 m), weight 55.8 kg, SpO2 100 %. CVP:  [5 mmHg-9 mmHg] 9 mmHg  Vent Mode: PRVC FiO2 (%):  [30 %] 30 % Set Rate:  [22 bmp] 22 bmp Vt Set:  [470 mL] 470 mL PEEP:  [5 cmH20] 5 cmH20 Plateau Pressure:  [19 cmH20-24 cmH20] 19 cmH20   Intake/Output Summary (Last 24 hours) at 07/25/2020 0998 Last data filed at 07/25/2020 0600 Gross per 24 hour  Intake 3154.87 ml  Output 4135 ml  Net -980.13 ml   Filed Weights   07/23/20 0800 07/24/20 0500 07/25/20 0307  Weight: 53.2 kg 55.1 kg 55.8 kg    Examination: General: Acute on chronic ill-appearing middle-aged gentleman lying in bed on mechanical ventilation in no acute distress HEENT: ETT, MM pink/moist, PERRL,  Neuro: Sedated on ventilator CV: s1s2 regular rate and rhythm, no  murmur, rubs, or gallops,  PULM: Clear to auscultation bilaterally, no increased work of breathing, tolerating ventilator well GI: soft, bowel sounds active in all 4 quadrants, non-tender, non-distended, tolerating TF Extremities: warm/dry, no edema  Skin: no rashes or lesions  Labs/imaging that I have personally reviewed    Chest x-ray 5/21 with pulmonary edema  Head CT 5/22 negative for cerebral edema no evidence of anoxic injury  5/24 pertinent lab work: AST 292, ALT 165, WBC 16.9, hgb 8.1,   Resolved Hospital Problem list     Assessment & Plan:  Cardiac arrest resulting in development of cardiogenic shock -V. fib arrest associated with STEMI, patient shocked 5 times with 15 minutes of CPR prior to ROSC Status post STEMI post LAD stent, cardiogenic shock -Emergent cath completed 5/21 occluded proximal LAD status post DES.  Per heart failure DES embolized and again LAD is completely occluded Cardiomyopathy EF 30-35 -Echo 5/21 with EF 30 to 35%, coarse apical trabeculation and calcified LV apical false tendon, LV with regional wall motion abnormality and grade 1 diastolic dysfunction New onset atrial fibrillation P: Heart failure and cardiology following, appreciate assistance Remains on norepinephrine  milrinone and vasopressin Daily assessment for need of further diuretics  Wean vasopressors as able Strict intake and output Trend cpxx  Continuous telemetry Daily weight Transition to p.o. amiodarone and Lovenox  Acute respiratory failure secondary to cardiac arrest, MI Right-sided aspiration pneumonia Pulmonary edema P: Continue ventilator support with  lung protective strategies  Continue to wean sedation to allow for SBT, language barrier present Wean PEEP and FiO2 for sats greater than 90%. Head of bed elevated 30 degrees. Plateau pressures less than 30 cm H20.  Follow intermittent chest x-ray and ABG.   Ensure adequate pulmonary hygiene  Follow cultures  VAP  bundle in place  PAD protocol Remains on empiric antibiotics  AKI, improving -Unknown baseline, creatinine continues to downtrend P: Follow renal function Monitor urine output Trend Bmet Ensure adequate renal perfusion Enteral hydration  Elevated transaminases secondary to shock liver -LFT initially improved but are slowly uptrending in the setting of continued cardiogenic shock  P: Will obtain right upper quadrant ultrasound Avoid hepatotoxins Supportive care Genic shock support as above  Acute encephalopathy, metabolic -Secondary to cardiac arrest -In the setting of cardiac arrest concern for anoxic injury however CT head negative and morning of 5/23 patient is able to follow commands when spoken in native language P: Continuous EEG stopped 5/23 Wean sedation as able Neuroprotective measures Nutrition and bowel regiment Aspiration precautions  Hypokalemia P: Supplement as needed Trend  At risk malnutrition  P: Continue tube feeds Protein supplementation  Best practice   Diet:  NPO Pain/Anxiety/Delirium protocol (if indicated): Yes (RASS goal -1) VAP protocol (if indicated): Yes DVT prophylaxis: Subcutaneous Heparin and SCD GI prophylaxis: H2B Glucose control:  SSI No Central venous access:  Yes, and it is still needed Arterial line:  N/A Foley:  Yes, and it is still needed Mobility:  bed rest  PT consulted: N/A Last date of multidisciplinary goals of care discussion: Family updated at bedside 5/23 Code Status:  full code Disposition: ICU  Critical care time:    Performed by: Delfin Gant  Total critical care time: 39 minutes  Critical care time was exclusive of separately billable procedures and treating other patients.  Critical care was necessary to treat or prevent imminent or life-threatening deterioration.  Critical care was time spent personally by me on the following activities: development of treatment plan with patient and/or surrogate  as well as nursing, discussions with consultants, evaluation of patient's response to treatment, examination of patient, obtaining history from patient or surrogate, ordering and performing treatments and interventions, ordering and review of laboratory studies, ordering and review of radiographic studies, pulse oximetry and re-evaluation of patient's condition.  Delfin Gant, NP-C [facility] Personal contact information can be found on Amion  07/25/2020, 7:11 AM   PCCM:  65 yo M, admitted s/p OOH cardiac arrest, vfib, STEMI, shocked X5, s/p LAD stent, ischemic cardiomyopathy. Right sided aspiration pneumonia   BP (!) 91/46   Pulse (!) 105   Temp (!) 100.58 F (38.1 C)   Resp (!) 22   Ht 5' 4.02" (1.626 m)   Wt 55.8 kg   SpO2 99%   BMI 21.11 kg/m   Gen: thin, elderly male, intubated on mechanical life support  HENT: tracking appropriately Heart: RRR, s1 s2  Lungs: BL vented breaths  Ext: no edema   Labs:  Sodium 138, scr 1.31, ast 318  A:  Cardiogenic shock  Septic Shock  Cardiac Arrest  STEMI , LAD  CAD Ischemic cardiomyopathy Right sided aspiration pna  Pulmonary edema  Acute metabolic encephalopathy secondary to above   P: Weaning from pressors  Remains on high dose infusions  Unable to extubate due to this. I suspect he will fail even though his mental status is ok off sedation  If he becomes to  agitated his pressures drop  Changes made to sedation regimen  Continue prn versed   Case discussed with Dr. Shirlee Latch from HF service   This patient is critically ill with multiple organ system failure; which, requires frequent high complexity decision making, assessment, support, evaluation, and titration of therapies. This was completed through the application of advanced monitoring technologies and extensive interpretation of multiple databases. During this encounter critical care time was devoted to patient care services described in this note  for 52 minutes.   Josephine Igo, DO [facility] 07/25/2020 1:00 PM

## 2020-07-25 NOTE — Progress Notes (Signed)
PCCM Progress Note   Notified by nursing that patient is having difficulty with sedation and vent compliance. Nursing stated that patient has required frequent versed pushes for episodes of agitation and possible pain on vent. Also informed that versed pushes cause worsening hemodynamics.   Given continued cardiogenic shock and tenuous hemodynamic status decision, in collaboration with attending MD, to change Fentanyl drip to Dilaudid. This will allow for potential better pain control and sedation for ETT.   Delfin Gant, NP-C [facility] Personal contact information can be found on Amion  07/25/2020, 5:42 PM

## 2020-07-25 NOTE — Progress Notes (Signed)
Tube holder changed at this time, placed above ears d/t pt's anatomy and sweating, tube tends to sag into mouth in normal position.  RN aware,  Rt will continue to monitor

## 2020-07-26 ENCOUNTER — Inpatient Hospital Stay (HOSPITAL_COMMUNITY): Payer: Medicare Other

## 2020-07-26 DIAGNOSIS — I2102 ST elevation (STEMI) myocardial infarction involving left anterior descending coronary artery: Secondary | ICD-10-CM | POA: Diagnosis not present

## 2020-07-26 DIAGNOSIS — I469 Cardiac arrest, cause unspecified: Secondary | ICD-10-CM | POA: Diagnosis not present

## 2020-07-26 DIAGNOSIS — R570 Cardiogenic shock: Secondary | ICD-10-CM | POA: Diagnosis not present

## 2020-07-26 DIAGNOSIS — J9601 Acute respiratory failure with hypoxia: Secondary | ICD-10-CM | POA: Diagnosis not present

## 2020-07-26 LAB — CBC
HCT: 21.7 % — ABNORMAL LOW (ref 39.0–52.0)
HCT: 25.2 % — ABNORMAL LOW (ref 39.0–52.0)
Hemoglobin: 7.3 g/dL — ABNORMAL LOW (ref 13.0–17.0)
Hemoglobin: 8.4 g/dL — ABNORMAL LOW (ref 13.0–17.0)
MCH: 31.5 pg (ref 26.0–34.0)
MCH: 30.4 pg (ref 26.0–34.0)
MCHC: 33.6 g/dL (ref 30.0–36.0)
MCHC: 33.3 g/dL (ref 30.0–36.0)
MCV: 93.5 fL (ref 80.0–100.0)
MCV: 91.3 fL (ref 80.0–100.0)
Platelets: 206 10*3/uL (ref 150–400)
Platelets: 241 10*3/uL (ref 150–400)
RBC: 2.32 MIL/uL — ABNORMAL LOW (ref 4.22–5.81)
RBC: 2.76 MIL/uL — ABNORMAL LOW (ref 4.22–5.81)
RDW: 15.1 % (ref 11.5–15.5)
RDW: 15.9 % — ABNORMAL HIGH (ref 11.5–15.5)
WBC: 14.4 10*3/uL — ABNORMAL HIGH (ref 4.0–10.5)
WBC: 15 10*3/uL — ABNORMAL HIGH (ref 4.0–10.5)
nRBC: 0.1 % (ref 0.0–0.2)
nRBC: 0.4 % — ABNORMAL HIGH (ref 0.0–0.2)

## 2020-07-26 LAB — COMPREHENSIVE METABOLIC PANEL
ALT: 178 U/L — ABNORMAL HIGH (ref 0–44)
AST: 325 U/L — ABNORMAL HIGH (ref 15–41)
Albumin: 2.1 g/dL — ABNORMAL LOW (ref 3.5–5.0)
Alkaline Phosphatase: 112 U/L (ref 38–126)
Anion gap: 8 (ref 5–15)
BUN: 30 mg/dL — ABNORMAL HIGH (ref 8–23)
CO2: 24 mmol/L (ref 22–32)
Calcium: 7.7 mg/dL — ABNORMAL LOW (ref 8.9–10.3)
Chloride: 107 mmol/L (ref 98–111)
Creatinine, Ser: 1.27 mg/dL — ABNORMAL HIGH (ref 0.61–1.24)
GFR, Estimated: >60 mL/min (ref >60)
Glucose, Bld: 151 mg/dL — ABNORMAL HIGH (ref 70–99)
Potassium: 3.6 mmol/L (ref 3.5–5.1)
Sodium: 139 mmol/L (ref 135–145)
Total Bilirubin: 1 mg/dL (ref 0.3–1.2)
Total Protein: 5 g/dL — ABNORMAL LOW (ref 6.5–8.1)

## 2020-07-26 LAB — GLUCOSE, CAPILLARY
Glucose-Capillary: 149 mg/dL — ABNORMAL HIGH (ref 70–99)
Glucose-Capillary: 176 mg/dL — ABNORMAL HIGH (ref 70–99)
Glucose-Capillary: 167 mg/dL — ABNORMAL HIGH (ref 70–99)
Glucose-Capillary: 168 mg/dL — ABNORMAL HIGH (ref 70–99)
Glucose-Capillary: 179 mg/dL — ABNORMAL HIGH (ref 70–99)
Glucose-Capillary: 143 mg/dL — ABNORMAL HIGH (ref 70–99)

## 2020-07-26 LAB — COOXEMETRY PANEL
Carboxyhemoglobin: 1.1 % (ref 0.5–1.5)
Methemoglobin: 1 % (ref 0.0–1.5)
O2 Saturation: 69.4 %
Total hemoglobin: 9.6 g/dL — ABNORMAL LOW (ref 12.0–16.0)

## 2020-07-26 LAB — PROTIME-INR
INR: 1.1 (ref 0.8–1.2)
Prothrombin Time: 13.9 seconds (ref 11.4–15.2)

## 2020-07-26 LAB — ABO/RH: ABO/RH(D): O POS

## 2020-07-26 LAB — HEPATITIS PANEL, ACUTE
HCV Ab: NONREACTIVE
Hep A IgM: NONREACTIVE
Hep B C IgM: NONREACTIVE
Hepatitis B Surface Ag: NONREACTIVE

## 2020-07-26 LAB — CORTISOL: Cortisol, Plasma: 26 ug/dL

## 2020-07-26 MED ORDER — SODIUM CHLORIDE 0.9% IV SOLUTION
Freq: Once | INTRAVENOUS | Status: AC
Start: 2020-07-26 — End: 2020-07-26

## 2020-07-26 MED ORDER — FUROSEMIDE 10 MG/ML IJ SOLN
60.0000 mg | Freq: Once | INTRAMUSCULAR | Status: AC
  Administered 2020-07-26: 60 mg via INTRAVENOUS
  Filled 2020-07-26: qty 6

## 2020-07-26 MED ORDER — MIDODRINE HCL 5 MG PO TABS
5.0000 mg | ORAL_TABLET | Freq: Three times a day (TID) | ORAL | Status: DC
  Administered 2020-07-26 (×3): 5 mg
  Filled 2020-07-26 (×3): qty 1

## 2020-07-26 MED ORDER — ROSUVASTATIN CALCIUM 20 MG PO TABS
40.0000 mg | ORAL_TABLET | Freq: Every day | ORAL | Status: DC
  Administered 2020-07-26 – 2020-08-10 (×16): 40 mg
  Filled 2020-07-26 (×16): qty 2

## 2020-07-26 MED ORDER — POTASSIUM CHLORIDE 20 MEQ PO PACK
40.0000 meq | PACK | Freq: Once | ORAL | Status: AC
  Administered 2020-07-26: 40 meq
  Filled 2020-07-26: qty 2

## 2020-07-26 MED ORDER — TICAGRELOR 90 MG PO TABS
90.0000 mg | ORAL_TABLET | Freq: Two times a day (BID) | ORAL | Status: DC
  Administered 2020-07-26 – 2020-08-08 (×26): 90 mg
  Filled 2020-07-26 (×27): qty 1

## 2020-07-26 MED ORDER — ASPIRIN 81 MG PO CHEW
81.0000 mg | CHEWABLE_TABLET | Freq: Every day | ORAL | Status: DC
  Administered 2020-07-26 – 2020-08-10 (×16): 81 mg
  Filled 2020-07-26 (×16): qty 1

## 2020-07-26 MED ORDER — AMIODARONE HCL 200 MG PO TABS
200.0000 mg | ORAL_TABLET | Freq: Two times a day (BID) | ORAL | Status: DC
  Administered 2020-07-26 – 2020-07-31 (×12): 200 mg
  Filled 2020-07-26 (×12): qty 1

## 2020-07-26 NOTE — Progress Notes (Signed)
Nutrition Follow Up  DOCUMENTATION CODES:   Severe malnutrition in context of chronic illness  INTERVENTION:   Continue tube feeding: -Vital AF 1.2 @ 55 ml/hr via Cortrak (1320 ml)   Provides: 1584 kcals, 99 grams protein, 1071 ml free water.   NUTRITION DIAGNOSIS:   Severe Malnutrition related to chronic illness as evidenced by severe fat depletion,severe muscle depletion.  Ongoing  GOAL:   Patient will meet greater than or equal to 90% of their needs   Met with TF  MONITOR:   Vent status,TF tolerance,Weight trends,Skin,Labs  REASON FOR ASSESSMENT:   Ventilator    ASSESSMENT:   Patient without PMH on file. Presents this admission after v.fib arrest associated with STEMI.   5/21- emergent cath, occluded proximal LAD status post DES complicated by embolization to distal LAD  Pt discussed during ICU rounds and with RN.   Pressors requirement increased overnight. Able to wean slightly his am. Maintaining MAP >60. Had large BM yesterday. Tolerating tube feeding at goal.   Admission weight: 55 kg  Current weight: 56.4 kg   Patient remains intubated on ventilator support MV: 10.3 L/min Temp (24hrs), Avg:100.2 F (37.9 C), Min:99.14 F (37.3 C), Max:100.94 F (38.3 C)  UOP: 4405 ml x 24 hrs   Drips: precedex, levophed, vasopressin  Medications: colace, SS novolog, miralax Labs: LFTs elevated CBG 129-177  Diet Order:   Diet Order            Diet NPO time specified  Diet effective now                 EDUCATION NEEDS:   Not appropriate for education at this time  Skin:  Skin Assessment: Reviewed RN Assessment  Last BM:  5/25  Height:   Ht Readings from Last 1 Encounters:  07/23/20 5' 4.02" (1.626 m)    Weight:   Wt Readings from Last 1 Encounters:  07/26/20 56.4 kg    BMI:  Body mass index is 21.33 kg/m.  Estimated Nutritional Needs:   Kcal:  1350-1600  Protein:  80-110  Fluid:  >/= 1.5 L/day  Mariana Single RD, LDN Clinical  Nutrition Pager listed in [place]

## 2020-07-26 NOTE — Plan of Care (Signed)

## 2020-07-26 NOTE — Progress Notes (Signed)
EKG CRITICAL VALUE     12 lead EKG performed.  Critical value noted.  Brenton Grills, RN notified.   Alto Denver, CCT 07/26/2020 8:24 AM

## 2020-07-26 NOTE — Progress Notes (Addendum)
NAMEValmore Horton, MRN:  [id number], DOB:  [birth year], LOS: 5 ADMISSION DATE:  07/21/2020, CONSULTATION DATE:  07/22/2019 REFERRING MD:  [place], CHIEF COMPLAINT:  Post arrest   History of Present Illness:  Patient is a 65 year old male with limited medical history found down by family, EMS was called, he was found to be in V. fib shocked 5 x 3 mg epinephrine 450 mg of amiodarone with ROSC obtained.  He was emergently taken to the Cath Lab showing ST segment changes in anterior lateral leads status post revascularization of LAD.  He was noted to have been moving spontaneously at time of cardiac catheterization.  Transferred to the ICU.  PCCM consulted for help with management  Pertinent  Medical History    has a past medical history of Cardiac arrest [facility]), Coronary artery disease, Dysrhythmia, and Myocardial infarction (HCC).  Significant Hospital Events: Including procedures, antibiotic start and stop dates in addition to other pertinent events    5/21 Vfib arrest, Cardiac catheterization, TTM started, right IJ placed  Interim History / Subjective:  Remains on significant vasopressor support  Objective   Blood pressure 108/67, pulse 89, temperature 99.68 F (37.6 C), resp. rate (!) 22, height 5' 4.02" (1.626 m), weight 56.4 kg, SpO2 99 %. CVP:  [3 mmHg-11 mmHg] 11 mmHg  Vent Mode: PRVC FiO2 (%):  [30 %] 30 % Set Rate:  [22 bmp] 22 bmp Vt Set:  [470 mL] 470 mL PEEP:  [5 cmH20] 5 cmH20 Pressure Support:  [12 cmH20] 12 cmH20 Plateau Pressure:  [15 cmH20-20 cmH20] 17 cmH20   Intake/Output Summary (Last 24 hours) at 07/26/2020 0935 Last data filed at 07/26/2020 5427 Gross per 24 hour  Intake 4022.64 ml  Output 4945 ml  Net -922.36 ml   Filed Weights   07/24/20 0500 07/25/20 0307 07/26/20 0500  Weight: 55.1 kg 55.8 kg 56.4 kg    Examination: General: Critically ill appearing adult male. Lying in bed  HEENT: ETT/NG in place  Neuro: Sedated, withdrawals in all extremities, does  not follow commands   CV: RRR, HR 83, no MRG  PULM: Clear breath sounds, vent assisted breaths  GI: active bowel sounds, soft, non-distended  Extremities: warm/dry, no edema  Skin: no rashes or lesions  Labs/imaging that I have personally reviewed    Chest x-ray 5/26 with stable bibasilar atelectasis/edema   [place] RUQ. Hepatic parenchymal echogenicity, borderline gallbladder thickness, no gallstones  Resolved Hospital Problem list     Assessment & Plan:   Cardiac arrest resulting in development of cardiogenic shock -V. fib arrest associated with STEMI, patient shocked 5 times with 15 minutes of CPR prior to ROSC Status post STEMI post LAD stent, cardiogenic shock -Emergent cath completed 5/21 occluded proximal LAD status post DES.  Per heart failure DES embolized and again LAD is completely occluded Cardiomyopathy EF 30-35 -Echo 5/21 with EF 30 to 35%, coarse apical trabeculation and calcified LV apical false tendon, LV with regional wall motion abnormality and grade 1 diastolic dysfunction New onset atrial fibrillation P: Heart failure and cardiology following, appreciate assistance Remains on norepinephrine  milrinone and vasopressin Daily assessment for need of further diuretics >> receiving 60 mg lasix this AM  Wean vasopressors as able (on 40 mcg/min levophed and 0.03 vasopressin)  Strict intake and output Trend coxx (O2 Sat 69.4 previous 52.6)  Continuous telemetry Daily weight Continue amiodarone and Lovenox  Acute respiratory failure secondary to cardiac arrest, MI Right-sided aspiration pneumonia Pulmonary edema P: Continue ventilator support with  lung protective strategies  Continue to wean sedation to allow for SBT, language barrier present Wean PEEP and FiO2 for sats greater than 90%. Head of bed elevated 30 degrees. Plateau pressures less than 30 cm H20.  Follow intermittent chest x-ray and ABG.   Ensure adequate pulmonary hygiene  Follow cultures >> Remains  negative. Send Resp Quant  VAP bundle in place  PAD protocol Remains on zosyn for aspiration coverage. Plan for 7 days. Stop date placed.   AKI -Unknown baseline, creatinine continues to downtrend P: Follow renal function Monitor urine output Trend BMP  Ensure adequate renal perfusion  Elevated transaminases secondary to shock liver -LFT initially improved but are slowly uptrending in the setting of continued cardiogenic shock  -[place] RUQ 5/25. Hepatic parenchymal echogenicity, borderline gallbladder thickness, no gallstones P: Trend LFT. Send Hepatitis Panel  Avoid hepatotoxins Supportive care  Acute encephalopathy, metabolic -Secondary to cardiac arrest -In the setting of cardiac arrest concern for anoxic injury however CT head negative and morning of 5/23 patient is able to follow commands when spoken in native language Continuous EEG stopped 5/23 P: Wean sedation as able Neuroprotective measures Nutrition and bowel regiment Aspiration precautions  Hypokalemia P: Supplement as needed Trend  At risk malnutrition  P: Continue tube feeds Protein supplementation  Best practice   Diet:  NPO Pain/Anxiety/Delirium protocol (if indicated): Yes (RASS goal -1) VAP protocol (if indicated): Yes DVT prophylaxis: Subcutaneous Heparin and SCD GI prophylaxis: H2B Glucose control:  SSI No Central venous access:  Yes, and it is still needed Arterial line:  Yes. Placed 5/23.  Foley:  Yes, and it is still needed Mobility:  bed rest  PT consulted: N/A Last date of multidisciplinary goals of care discussion: Pending.  Code Status:  full code Disposition: ICU  Critical care time:    Performed by: Tobey Grim  Total critical care time: 32 minutes  Critical care time was exclusive of separately billable procedures and treating other patients.  Critical care was necessary to treat or prevent imminent or life-threatening deterioration.  Critical care was time spent  personally by me on the following activities: development of treatment plan with patient and/or surrogate as well as nursing, discussions with consultants, evaluation of patient's response to treatment, examination of patient, obtaining history from patient or surrogate, ordering and performing treatments and interventions, ordering and review of laboratory studies, ordering and review of radiographic studies, pulse oximetry and re-evaluation of patient's condition.  Jovita Kussmaul, AGACNP-BC [facility]  PCCM Pgr: [phone]   PCCM Attending:   65 yo M, VFIB arrest, anterior stemi, cardiogenic shock, intubated on life support, remains on high dose pressors.   BP 95/67   Pulse (!) 107   Temp 99.86 F (37.7 C)   Resp 20   Ht 5' 4.02" (1.626 m)   Wt 56.4 kg   SpO2 99%   BMI 21.33 kg/m   Gen: elderly male, resting in bed, critically ill, on mechanical life support  HENT: ETT in place  Heart: RRR, s1 s2  Lungs: BL vented breaths  Abd: soft, nt, nd   Labs reviewed  Na 139, K3.6, Scr 1.27, AST 325 ALT 178 WBC 14.4  A:  Cardiogenic shock  Anterior STEMI  AHRF on MV 2/2 above  Cardiac arrest  VFIB arrest  Ischemic cardiomyopathy  Right sided aspiration pneumonia following arrest  Acute metabolic encephalopathy secondary to above   P: Continuing to wean from pressors as tolerated  Sedated better on dilaudid  If he gets to agitated his pressor drops  Already on of levo + vaso  Continue prn versed  Seems to be a pressor limits disease at this time Awaiting recovery and able to titrate down  Continue diuresis  Following UOP   This patient is critically ill with multiple organ system failure; which, requires frequent high complexity decision making, assessment, support, evaluation, and titration of therapies. This was completed through the application of advanced monitoring technologies and extensive interpretation of multiple databases. During  this encounter critical care time was devoted to patient care services described in this note for 50 minutes.  Josephine Igo, DO [facility] 07/26/2020 10:53 AM

## 2020-07-26 NOTE — Progress Notes (Signed)
Patient ID: Vincent Horton, male   DOB: January 23, 1956, 65 y.o.   MRN: [id number] P    Advanced Heart Failure Rounding Note  PCP-Cardiologist: None   Subjective:    This morning, NSR in 80s with PACs.  I/Os net negative -500 with 1 dose IV Lasix.  CVP 11.  Co-ox 69%, on NE 40, milrinone 0.375, vasopressin 0.04.  Having trouble weaning pressors, he requires significant sedation due to agitation, drops BP considerably with agitation.      Hgb down to 7.3, no acute bleeding.   Per nursing, he wakes up and will follow commands but gets agitated. Does not speak [place].  Zosyn for possible aspiration PNA.   EEG with severe diffuse encephalopathy, no seizures. CT head with chronic microvascular changes.   Echo: EF 30-35%, no LV thrombus, LAD territory WMAs, RV normal, IVC normal.    Objective:   Weight Range: 56.4 kg Body mass index is 21.33 kg/m.   Vital Signs:   Temp:  [99.14 F (37.3 C)-100.94 F (38.3 C)] 100.04 F (37.8 C) (05/26 0700) Pulse Rate:  [85-128] 92 (05/26 0700) Resp:  [11-25] 22 (05/26 0700) BP: (75-160)/(45-97) 110/69 (05/26 0700) SpO2:  [94 %-100 %] 98 % (05/26 0700) Arterial Line BP: (64-186)/(41-80) 119/54 (05/26 0700) FiO2 (%):  [30 %] 30 % (05/26 0404) Weight:  [56.4 kg] 56.4 kg (05/26 0500) Last BM Date: 07/25/20  Weight change: Filed Weights   07/24/20 0500 07/25/20 0307 07/26/20 0500  Weight: 55.1 kg 55.8 kg 56.4 kg    Intake/Output:   Intake/Output Summary (Last 24 hours) at 07/26/2020 0743 Last data filed at 07/26/2020 0700 Gross per 24 hour  Intake 4088.9 ml  Output 4405 ml  Net -316.1 ml      Physical Exam    General: Intubated/sedated.  Neck: JVP 10 cm, no thyromegaly or thyroid nodule.  Lungs: Clear to auscultation bilaterally with normal respiratory effort. CV: Nondisplaced PMI.  Heart regular S1/S2, no S3/S4, no murmur.  No peripheral edema.   Abdomen: Soft, nontender, no hepatosplenomegaly, no distention.  Skin: Intact without lesions  or rashes.  Neurologic: Alert and oriented x 3.  Psych: Sedated on vent  HEENT: Normal.    Telemetry   NSR 80s with PACs (personally reviewed)   Labs    CBC Recent Labs    07/25/20 0327 07/26/20 0442  WBC 14.1* 14.4*  HGB 9.4* 7.3*  HCT 27.9* 21.7*  MCV 94.3 93.5  PLT 181 408   Basic Metabolic Panel Recent Labs    07/24/20 0311 07/24/20 1406 07/24/20 2028 07/25/20 0327 07/26/20 0442  NA 138 136   < > 138 139  K 3.9 4.0   < > 3.7 3.6  CL 106 105  --  106 107  CO2 27 25  --  25 24  GLUCOSE 138* 151*  --  171* 151*  BUN 36* 35*  --  34* 30*  CREATININE 1.24 1.25*  --  1.31* 1.27*  CALCIUM 7.2* 7.5*  --  7.4* 7.7*  MG 2.1 1.9  --   --   --   PHOS 2.0* 1.8*  --   --   --    < > = values in this interval not displayed.   Liver Function Tests Recent Labs    07/25/20 0327 07/26/20 0442  AST 318* 325*  ALT 168* 178*  ALKPHOS 122 112  BILITOT 0.7 1.0  PROT 4.7* 5.0*  ALBUMIN 2.1* 2.1*   No results for input(s): LIPASE, AMYLASE in  the last 72 hours. Cardiac Enzymes No results for input(s): CKTOTAL, CKMB, CKMBINDEX, TROPONINI in the last 72 hours.  BNP: BNP (last 3 results) No results for input(s): BNP in the last 8760 hours.  ProBNP (last 3 results) No results for input(s): PROBNP in the last 8760 hours.   D-Dimer No results for input(s): DDIMER in the last 72 hours. Hemoglobin A1C No results for input(s): HGBA1C in the last 72 hours. Fasting Lipid Panel No results for input(s): CHOL, HDL, LDLCALC, TRIG, CHOLHDL, LDLDIRECT in the last 72 hours. Thyroid Function Tests No results for input(s): TSH, T4TOTAL, T3FREE, THYROIDAB in the last 72 hours.  Invalid input(s): FREET3  Other results:   Imaging    US Abdomen Limited RUQ (LIVER/GB)  Result Date: 07/25/2020 CLINICAL DATA:  Transaminitis. EXAM: ULTRASOUND ABDOMEN LIMITED RIGHT UPPER QUADRANT COMPARISON:  None. FINDINGS: Gallbladder: Physiologically distended. No intraluminal gallstones or  sludge. Borderline wall thickness of 3-4 mm. No sonographic Murphy sign noted by sonographer. Common bile duct: Diameter: 4 mm. Liver: No focal lesion identified. Borderline increased in parenchymal echogenicity. Portal vein is patent on color Doppler imaging with normal direction of blood flow towards the liver. Other: Right pleural effusion.  No right upper quadrant ascites. IMPRESSION: 1. Borderline increased hepatic parenchymal echogenicity which may represent mild steatosis or other intrinsic hepatocellular disease. 2. Borderline gallbladder wall thickness of 3-4 mm, nonspecific. No gallstones or additional findings of acute cholecystitis. No biliary dilatation. 3. Right pleural effusion. Electronically Signed   By: Keith Rake M.D.   On: 07/25/2020 23:51     Medications:     Scheduled Medications: . amiodarone  200 mg Oral BID  . aspirin  81 mg Oral Daily  . aspirin  300 mg Rectal Once  . chlorhexidine gluconate (MEDLINE KIT)  15 mL Mouth Rinse BID  . Chlorhexidine Gluconate Cloth  6 each Topical Daily  . docusate  100 mg Per Tube BID  . enoxaparin (LOVENOX) injection  40 mg Subcutaneous Daily  . furosemide  60 mg Intravenous Once  .  HYDROmorphone (DILAUDID) injection  0.5 mg Intravenous Once  . insulin aspart  0-15 Units Subcutaneous Q4H  . mouth rinse  15 mL Mouth Rinse 10 times per day  . midodrine  5 mg Per Tube TID WC  . polyethylene glycol  17 g Per Tube Daily  . potassium chloride  40 mEq Per Tube Once  . potassium chloride  40 mEq Per Tube Once  . rosuvastatin  40 mg Oral Daily  . sodium chloride flush  10-40 mL Intracatheter Q12H  . sodium chloride flush  3 mL Intravenous Q12H  . ticagrelor  90 mg Oral BID    Infusions: . sodium chloride Stopped (07/21/20 0707)  . sodium chloride    . sodium chloride Stopped (07/26/20 0525)  . sodium chloride    . dexmedetomidine (PRECEDEX) IV infusion 1.6 mcg/kg/hr (07/26/20 0700)  . famotidine (PEPCID) IV Stopped (07/25/20  2157)  . feeding supplement (VITAL AF 1.2 CAL) 1,000 mL (07/26/20 0228)  . HYDROmorphone 2 mg/hr (07/26/20 0700)  . milrinone 0.375 mcg/kg/min (07/26/20 0700)  . norepinephrine (LEVOPHED) Adult infusion 40 mcg/min (07/26/20 0700)  . piperacillin-tazobactam (ZOSYN)  IV 12.5 mL/hr at 07/26/20 0700  . vasopressin 0.04 Units/min (07/26/20 0700)    PRN Medications: sodium chloride, Place/Maintain arterial line **AND** sodium chloride, acetaminophen, HYDROmorphone, midazolam, ondansetron (ZOFRAN) IV, sodium chloride flush, sodium chloride flush    Assessment/Plan   1. CAD: Anterior STEMI with occluded proximal LAD.  Treated with DES but complicated by embolization to distal LAD (procedure completed with occluded distal LAD).    - Continue ticagrelor and ASA 81.  - Crestor 40 daily. 2. Cardiogenic shock: Ischemic cardiomyopathy. Echo with EF 30-35%, no LV thrombus, LAD territory WMAs, RV normal, IVC normal. Currently on NE 40, milrinone 0.375, vasopressin 0.04.  Co-ox 69%.  CVP 11 with I/Os negative.  Difficulty weaning pressors due to drop in BP with sedation.  - Continue milrinone 0.375.  - Continue to work on weaning pressors, add midodrine 5 mg tid.  - Lasix 60 mg IV x 1 bolus again this morning with KCl.  3. Acute hypoxemic respiratory failure: Aspiration PNA (lower lobe opacities on CXR) + pulmonary edema. WBCs 21 => 16.9 => 14. Tm 100.   - On Zosyn per CCM.  - Vent per CCM.  4. Cardiac arrest: VF arrest associated with STEMI. Patient shocked x 5, 15 min CPR before ROSC.  No further VT.  Now rewarmed s/p hypothermia protocol.  He has been revascularized.  - Lifevest if recovers to assess for recovery of EF.  5. Neuro: EEG with severe diffuse encephalopathy.  CT head showed chronic microvascular changes.  Wakes up agitated, does have purposeful motion (does not speak [place]).  - Wean sedation with family around.  6. Anemia: Hgb down to 7.3 but no overt bleeding noticed.  - Transfuse 1  unit PRBCs today.  7. Atrial fibrillation: I have not seen any actual atrial fibrillation, has had ST with PACs.  Currently in NSR.  - Lovenox prophy - po amiodarone.    CRITICAL CARE Performed by: Loralie Champagne  Total critical care time: 40 minutes  Critical care time was exclusive of separately billable procedures and treating other patients.  Critical care was necessary to treat or prevent imminent or life-threatening deterioration.  Critical care was time spent personally by me on the following activities: development of treatment plan with patient and/or surrogate as well as nursing, discussions with consultants, evaluation of patient's response to treatment, examination of patient, obtaining history from patient or surrogate, ordering and performing treatments and interventions, ordering and review of laboratory studies, ordering and review of radiographic studies, pulse oximetry and re-evaluation of patient's condition.  Loralie Champagne 07/26/2020 7:43 AM   Length of Stay: 5  Loralie Champagne, MD  07/26/2020, 7:43 AM  Advanced Heart Failure Team Pager [phone] (M-F; 7a - 5p)  Please contact [facility] for night-coverage after hours (5p -7a ) and weekends on [email or web address]

## 2020-07-26 NOTE — Plan of Care (Signed)
  Problem: Education: Goal: Knowledge of General Education information will improve Description: Including pain rating scale, medication(s)/side effects and non-pharmacologic comfort measures Outcome: Progressing   Problem: Health Behavior/Discharge Planning: Goal: Ability to manage health-related needs will improve Outcome: Progressing   Problem: Activity: Goal: Ability to tolerate increased activity will improve Outcome: Not Applicable   Problem: Respiratory: Goal: Ability to maintain a clear airway and adequate ventilation will improve Outcome: Progressing   Problem: Role Relationship: Goal: Method of communication will improve Outcome: Not Progressing   Problem: Education: Goal: Understanding of cardiac disease, CV risk reduction, and recovery process will improve Outcome: Progressing Goal: Understanding of medication regimen will improve Outcome: Progressing   Problem: Cardiac: Goal: Ability to achieve and maintain adequate cardiopulmonary perfusion will improve Outcome: Progressing Goal: Vascular access site(s) Level 0-1 will be maintained Outcome: Progressing

## 2020-07-27 DIAGNOSIS — J9601 Acute respiratory failure with hypoxia: Secondary | ICD-10-CM | POA: Diagnosis not present

## 2020-07-27 DIAGNOSIS — R570 Cardiogenic shock: Secondary | ICD-10-CM | POA: Diagnosis not present

## 2020-07-27 DIAGNOSIS — I2102 ST elevation (STEMI) myocardial infarction involving left anterior descending coronary artery: Secondary | ICD-10-CM | POA: Diagnosis not present

## 2020-07-27 LAB — COOXEMETRY PANEL
Carboxyhemoglobin: 1.3 % (ref 0.5–1.5)
Methemoglobin: 1 % (ref 0.0–1.5)
O2 Saturation: 57.3 %
Total hemoglobin: 11.4 g/dL — ABNORMAL LOW (ref 12.0–16.0)

## 2020-07-27 LAB — TYPE AND SCREEN
ABO/RH(D): O POS
Antibody Screen: NEGATIVE
Unit division: 0

## 2020-07-27 LAB — COMPREHENSIVE METABOLIC PANEL
ALT: 163 U/L — ABNORMAL HIGH (ref 0–44)
AST: 244 U/L — ABNORMAL HIGH (ref 15–41)
Albumin: 2.1 g/dL — ABNORMAL LOW (ref 3.5–5.0)
Alkaline Phosphatase: 97 U/L (ref 38–126)
Anion gap: 5 (ref 5–15)
BUN: 30 mg/dL — ABNORMAL HIGH (ref 8–23)
CO2: 24 mmol/L (ref 22–32)
Calcium: 7.8 mg/dL — ABNORMAL LOW (ref 8.9–10.3)
Chloride: 110 mmol/L (ref 98–111)
Creatinine, Ser: 1.28 mg/dL — ABNORMAL HIGH (ref 0.61–1.24)
GFR, Estimated: >60 mL/min (ref >60)
Glucose, Bld: 205 mg/dL — ABNORMAL HIGH (ref 70–99)
Potassium: 4.1 mmol/L (ref 3.5–5.1)
Sodium: 139 mmol/L (ref 135–145)
Total Bilirubin: 0.8 mg/dL (ref 0.3–1.2)
Total Protein: 5.2 g/dL — ABNORMAL LOW (ref 6.5–8.1)

## 2020-07-27 LAB — CBC
HCT: 25.6 % — ABNORMAL LOW (ref 39.0–52.0)
Hemoglobin: 8.6 g/dL — ABNORMAL LOW (ref 13.0–17.0)
MCH: 30.5 pg (ref 26.0–34.0)
MCHC: 33.6 g/dL (ref 30.0–36.0)
MCV: 90.8 fL (ref 80.0–100.0)
Platelets: 236 10*3/uL (ref 150–400)
RBC: 2.82 MIL/uL — ABNORMAL LOW (ref 4.22–5.81)
RDW: 16.5 % — ABNORMAL HIGH (ref 11.5–15.5)
WBC: 13.7 10*3/uL — ABNORMAL HIGH (ref 4.0–10.5)
nRBC: 0.4 % — ABNORMAL HIGH (ref 0.0–0.2)

## 2020-07-27 LAB — GLUCOSE, CAPILLARY
Glucose-Capillary: 172 mg/dL — ABNORMAL HIGH (ref 70–99)
Glucose-Capillary: 174 mg/dL — ABNORMAL HIGH (ref 70–99)
Glucose-Capillary: 160 mg/dL — ABNORMAL HIGH (ref 70–99)
Glucose-Capillary: 157 mg/dL — ABNORMAL HIGH (ref 70–99)
Glucose-Capillary: 128 mg/dL — ABNORMAL HIGH (ref 70–99)

## 2020-07-27 LAB — CULTURE, BLOOD (ROUTINE X 2)
Culture: NO GROWTH
Culture: NO GROWTH

## 2020-07-27 LAB — BPAM RBC
Blood Product Expiration Date: 202206012359
ISSUE DATE / TIME: 202205261337
Unit Type and Rh: 5100

## 2020-07-27 LAB — MAGNESIUM: Magnesium: 2.1 mg/dL (ref 1.7–2.4)

## 2020-07-27 LAB — PROTIME-INR
INR: 1.1 (ref 0.8–1.2)
Prothrombin Time: 13.9 seconds (ref 11.4–15.2)

## 2020-07-27 MED ORDER — MIDODRINE HCL 5 MG PO TABS
10.0000 mg | ORAL_TABLET | Freq: Three times a day (TID) | ORAL | Status: DC
  Administered 2020-07-27 – 2020-07-29 (×7): 10 mg
  Filled 2020-07-27 (×7): qty 2

## 2020-07-27 MED ORDER — METOCLOPRAMIDE HCL 5 MG/5ML PO SOLN
10.0000 mg | Freq: Four times a day (QID) | ORAL | Status: AC | PRN
  Administered 2020-07-27: 10 mg
  Filled 2020-07-27 (×2): qty 10

## 2020-07-27 MED ORDER — FUROSEMIDE 10 MG/ML IJ SOLN: 60.0000 mg | Freq: Two times a day (BID) | INTRAMUSCULAR | Status: DC

## 2020-07-27 MED ORDER — ACETAMINOPHEN 160 MG/5ML PO SOLN
650.0000 mg | Freq: Four times a day (QID) | ORAL | Status: DC | PRN
  Administered 2020-07-27 – 2020-08-01 (×7): 650 mg
  Filled 2020-07-27 (×7): qty 20.3

## 2020-07-27 MED ORDER — CLONAZEPAM 0.25 MG PO TBDP
0.5000 mg | ORAL_TABLET | Freq: Three times a day (TID) | ORAL | Status: DC
  Administered 2020-07-27 – 2020-08-10 (×40): 0.5 mg
  Filled 2020-07-27 (×2): qty 2
  Filled 2020-07-27: qty 1
  Filled 2020-07-27: qty 2
  Filled 2020-07-27: qty 1
  Filled 2020-07-27 (×4): qty 2
  Filled 2020-07-27 (×4): qty 1
  Filled 2020-07-27: qty 2
  Filled 2020-07-27: qty 1
  Filled 2020-07-27: qty 2
  Filled 2020-07-27 (×2): qty 1
  Filled 2020-07-27 (×2): qty 2
  Filled 2020-07-27 (×2): qty 1
  Filled 2020-07-27 (×2): qty 2
  Filled 2020-07-27 (×3): qty 1
  Filled 2020-07-27 (×2): qty 2
  Filled 2020-07-27: qty 1
  Filled 2020-07-27 (×3): qty 2
  Filled 2020-07-27: qty 1
  Filled 2020-07-27: qty 2
  Filled 2020-07-27: qty 1
  Filled 2020-07-27 (×2): qty 2
  Filled 2020-07-27: qty 1
  Filled 2020-07-27: qty 2
  Filled 2020-07-27: qty 1

## 2020-07-27 MED ORDER — OXYCODONE HCL 5 MG PO TABS
10.0000 mg | ORAL_TABLET | Freq: Three times a day (TID) | ORAL | Status: DC
  Administered 2020-07-27 – 2020-08-01 (×16): 10 mg
  Filled 2020-07-27 (×16): qty 2

## 2020-07-27 MED ORDER — FUROSEMIDE 10 MG/ML IJ SOLN
60.0000 mg | Freq: Once | INTRAMUSCULAR | Status: AC
  Administered 2020-07-27: 60 mg via INTRAVENOUS
  Filled 2020-07-27: qty 6

## 2020-07-27 NOTE — Progress Notes (Addendum)
NAMEPatrich Horton, MRN:  [id number], DOB:  October 30, 1955, LOS: 6 ADMISSION DATE:  07/21/2020, CONSULTATION DATE:  07/22/2019 REFERRING MD:  [place], CHIEF COMPLAINT:  Post arrest   History of Present Illness:  Patient is a 65 year old male with limited medical history found down by family, EMS was called, he was found to be in V. fib shocked 5 x 3 mg epinephrine 450 mg of amiodarone with ROSC obtained.  He was emergently taken to the Cath Lab showing ST segment changes in anterior lateral leads status post revascularization of LAD.  He was noted to have been moving spontaneously at time of cardiac catheterization.  Transferred to the ICU.  PCCM consulted for help with management  Pertinent  Medical History    has a past medical history of Cardiac arrest [facility]), Coronary artery disease, Dysrhythmia, and Myocardial infarction (HCC).  Significant Hospital Events: Including procedures, antibiotic start and stop dates in addition to other pertinent events    5/21 Vfib arrest, Cardiac catheterization, TTM started, right IJ placed  5/22 remains on NE/ milrinone, sedated on fentanyl versed   5/23 aline placed, concern for afib controlled on IV amio, f/c this am with family  5/24 ongoing agitation, pressor requirements  5/25  Issues with sedation overnight, Remains on significant vasopressor support  5/26 Remains on significant vasopressor support, started on midodrine   5/21 MRSA neg 5/22 BCx2 >> neg 5/26 trach asp >>  5/21 zosyn >> (last dose 5/27)  Interim History / Subjective:   S/p 1 unit PRBC yest, Hgb 8.4-> 8.6 coox 57.3/ CVP 10 tmax 100.4 overnight Remains on vasopressin 0.04, NE 20 -> 14 mcg, milrinone 0.375, dex 1.6, dilaudid 4mg /hr UOP 4.7L after duresis lasix 60 mg yest -1L/ remains net +2.7L  Left radial line not working  Objective   Blood pressure 98/69, pulse 78, temperature 99 F (37.2 C), resp. rate (!) 22, height 5' 4.02" (1.626 m), weight 53.3 kg, SpO2 100 %. CVP:   [6 mmHg-14 mmHg] 14 mmHg  Vent Mode: PRVC FiO2 (%):  [30 %] 30 % Set Rate:  [22 bmp] 22 bmp Vt Set:  [470 mL] 470 mL PEEP:  [5 cmH20] 5 cmH20 Pressure Support:  [12 cmH20] 12 cmH20 Plateau Pressure:  [13 cmH20-21 cmH20] 20 cmH20   Intake/Output Summary (Last 24 hours) at 07/27/2020 0817 Last data filed at 07/27/2020 0600 Gross per 24 hour  Intake 3618.44 ml  Output 4765 ml  Net -1146.56 ml   Filed Weights   07/25/20 0307 07/26/20 0500 07/27/20 0422  Weight: 55.8 kg 56.4 kg 53.3 kg    Examination: on dilaudid 4mg  /hr, precedex 1.6, and s/p versed around 0630  General:  Critically ill, thin adult male lying in bed in NAD HEENT: MM pink/moist, ETT 7 at 26 at teeth, left nare cortrak, pupils 3/sluggish, temporal wasting Neuro: ?language barrier, opens eyes to verbal, otherwise not f/c for me, will withdrawal in all extremities CV: currently NSR w/ PACs but no AF, cool extremities PULM:  Non labored on PSV 12/5, clear throughout, minimal secretions, diminished in bases GI: soft, bs+, 2 BM since last night, foley   Extremities: cool/dry, no LE edema, +1/ trace DP, one PIV- left EJ Skin: no rashes   Labs/imaging that I have personally reviewed    Chest x-ray 5/26 with stable bibasilar atelectasis/edema   14/5 RUQ. Hepatic parenchymal echogenicity, borderline gallbladder thickness, no gallstones  CBC, CMET, coox, coags, no CXR  Resolved Hospital Problem list     Assessment &  Plan:   Cardiac arrest resulting in development of cardiogenic shock -V. fib arrest associated with STEMI, patient shocked 5 times with 15 minutes of CPR prior to ROSC Status post STEMI post LAD stent, cardiogenic shock -Emergent cath completed 5/21 occluded proximal LAD status post DES.  Per heart failure DES embolized and again LAD is completely occluded Cardiomyopathy EF 30-35 -Echo 5/21 with EF 30 to 35%, coarse apical trabeculation and calcified LV apical false tendon, LV with regional wall motion  abnormality and grade 1 diastolic dysfunction New onset atrial fibrillation- currently NSR w/PACs P: - per HF team/ cards - continue ASA/ ticagrelor/ crestor - continue milrinone 0.375 - HF increasing lasix to 60mg  BID - strict I/Os, trend CVPs - hold on replacing aline given weaning pressor requirements (sedation also a contributing factor) - continue to wean NE and vasopressin for MAP goal > 65 - midodrine added 5/26, continue 10mg  TID - continue amio and lovenox 40mg  qd - will need life vest if recovers  Acute respiratory failure secondary to cardiac arrest, MI Right-sided aspiration pneumonia Pulmonary edema P: -Continue MV support, 8cc/kg IBW with goal Pplat <30 and DP<15  - rate 14 -VAP prevention protocol/ PPI -PAD protocol for sedation> wean dilaudid gtt/ precedex (considered clonidine taper but given ongoing vasopressor requirements; defer at this time) as able for RASS goal 0/-1, adding enteral sedation 5/27 - oxy IR and klonopin to help wean w/ bowel regimen -wean FiO2 as able for SpO2 >92%  -weaning well this am 12/5 despite sedation, mental status/ oversedation remains a barrier - follow trach asp - will complete 7 day course zosyn today for aspiration coverage - diuresis as above  AKI -Unknown baseline, creatinine continues to downtrend P: - good diuresis and improving sCr 1.77-> 1.28 - continue foley for now, consider d/c when mental status improves - Trend BMP / urinary output - Replace electrolytes as indicated - Avoid nephrotoxic agents, ensure adequate renal perfusion  Elevated transaminases secondary to shock liver -LFT initially improved but are slowly uptrending in the setting of continued cardiogenic shock  - RUQ 5/25. Hepatic parenchymal echogenicity, borderline gallbladder thickness, no gallstones P: - slowing improving LFTs, coags remain wnl -  Hepatitis Panel neg - trend LFTs - Avoid hepatotoxins  Acute encephalopathy, metabolic -Secondary  to cardiac arrest -In the setting of cardiac arrest concern for anoxic injury however CT head negative and morning of 5/23 patient is able to follow commands when spoken in native language Continuous EEG stopped 5/23, thus far showing diffuse encephalopathy, no seizures P: - continue to wean sedation as able - neuroprotective measures  Hypokalemia P: - K 4.1 - trend BMET/ mag  At risk malnutrition  P: Continue tube feeds Protein supplementation  Best practice   Diet:  NPO, TF at goal  Pain/Anxiety/Delirium protocol (if indicated): Yes (RASS goal -1) VAP protocol (if indicated): Yes DVT prophylaxis: LMWH and SCD GI prophylaxis: H2B Glucose control:  SSI moderate Central venous access:  Yes, and it is still needed Arterial line:  Yes. Placed 5/23.  Not working 5/27, will remove Foley:  Yes, and it is still needed Mobility:  bed rest  PT consulted: N/A Last date of multidisciplinary goals of care discussion: Pending.  Code Status:  full code Disposition: ICU  No family at bedside  Critical care time:  40 mins      6/23, ACNP  Pulmonary & Critical Care 07/27/2020, 9:10 AM   PCCM Attending:   65 yo M, STEMI, LAD, cardiogenic shock  BP 133/79   Pulse (!) 133   Temp (!) 101.12 F (38.4 C)   Resp (!) 22   Ht 5' 4.02" (1.626 m)   Wt 53.3 kg   SpO2 94%   BMI 20.16 kg/m   Gen: elderly male, chronically ill appearing.  HENT: tracking, ett in place  Heart: rrr, s1 s2  Lungs: BL vented breaths   Labs: scr 1.28, na 139 AST 244, ALT 163 WBC 13.7  A:  Cardiac arrest Cardiogenic shock  STEMI, LAD stent  Ischemic cardiomyopathy 30-35%  New onset atrial fib  AHRF on MV   P: Wean from pressors  Still on NEPI and vaso  Overall pressor requiring is improving  Continue ABX  Prn tylenol for fevers  Follow UOP and kidney function   This patient is critically ill with multiple organ system failure; which, requires frequent high complexity  decision making, assessment, support, evaluation, and titration of therapies. This was completed through the application of advanced monitoring technologies and extensive interpretation of multiple databases. During this encounter critical care time was devoted to patient care services described in this note for 42 minutes.  Josephine Igo, DO  Pulmonary Critical Care 07/27/2020 3:54 PM

## 2020-07-27 NOTE — Progress Notes (Addendum)
Patient ID: Vincent Horton, male   DOB: 07/25/1955, 65 y.o.   MRN: [id number] P    Advanced Heart Failure Rounding Note  PCP-Cardiologist: None   Subjective:     Remains intubated and sedated, FiO2 30%.   Having trouble weaning pressors, he requires significant sedation due to agitation, drops BP considerably with agitation.  Remains on NE 18 + Milrinone 0.375 + VP 0.04. MAPs upper 60s. Co-ox 57%  Good UOP yesterday w/ IV Lasix, -4.8L but only net negative 999cc.  CVP 12   Transfused yesterday x 1U RBCs, Hgb 7.3>>8.6   Zosyn for possible aspiration PNA. mTemp overnight 100.4. WBC 15>>13.7   EEG with severe diffuse encephalopathy, no seizures. CT head with chronic microvascular changes.   Echo: EF 30-35%, no LV thrombus, LAD territory WMAs, RV normal, IVC normal.    Objective:   Weight Range: 53.3 kg Body mass index is 20.16 kg/m.   Vital Signs:   Temp:  [99 F (37.2 C)-100.76 F (38.2 C)] 99 F (37.2 C) (05/27 0600) Pulse Rate:  [69-160] 78 (05/27 0500) Resp:  [11-27] 22 (05/27 0600) BP: (87-138)/(49-90) 98/69 (05/27 0600) SpO2:  [91 %-100 %] 100 % (05/27 0500) Arterial Line BP: (89-163)/(43-68) 107/48 (05/27 0600) FiO2 (%):  [30 %] 30 % (05/27 0400) Weight:  [53.3 kg] 53.3 kg (05/27 0422) Last BM Date: 07/25/20  Weight change: Filed Weights   07/25/20 0307 07/26/20 0500 07/27/20 0422  Weight: 55.8 kg 56.4 kg 53.3 kg    Intake/Output:   Intake/Output Summary (Last 24 hours) at 07/27/2020 0724 Last data filed at 07/27/2020 0600 Gross per 24 hour  Intake 3765.67 ml  Output 4765 ml  Net -999.33 ml      Physical Exam    CVP 12  General: thin male, intubated and sedated   Neck: JVP 12 cm, no thyromegaly or thyroid nodule.  Lungs: Intubated, rhonchi on the rt. No wheezing  CV: Nondisplaced PMI.  Heart regular S1/S2, no S3/S4, no murmur.  No peripheral edema.   Abdomen: Soft, nontender, no hepatosplenomegaly, no distention.  Skin: Intact without lesions or rashes.   Neurologic: intubated and sedated.  Psych: Sedated on vent  HEENT: + ETT, + cor-trak Normal.    Telemetry   NSR 80s  (personally reviewed)   Labs    CBC Recent Labs    07/26/20 1810 07/27/20 0408  WBC 15.0* 13.7*  HGB 8.4* 8.6*  HCT 25.2* 25.6*  MCV 91.3 90.8  PLT 241 160   Basic Metabolic Panel Recent Labs    07/24/20 1406 07/24/20 2028 07/26/20 0442 07/27/20 0408  NA 136   < > 139 139  K 4.0   < > 3.6 4.1  CL 105   < > 107 110  CO2 25   < > 24 24  GLUCOSE 151*   < > 151* 205*  BUN 35*   < > 30* 30*  CREATININE 1.25*   < > 1.27* 1.28*  CALCIUM 7.5*   < > 7.7* 7.8*  MG 1.9  --   --   --   PHOS 1.8*  --   --   --    < > = values in this interval not displayed.   Liver Function Tests Recent Labs    07/26/20 0442 07/27/20 0408  AST 325* 244*  ALT 178* 163*  ALKPHOS 112 97  BILITOT 1.0 0.8  PROT 5.0* 5.2*  ALBUMIN 2.1* 2.1*   No results for input(s): LIPASE, AMYLASE in the last  72 hours. Cardiac Enzymes No results for input(s): CKTOTAL, CKMB, CKMBINDEX, TROPONINI in the last 72 hours.  BNP: BNP (last 3 results) No results for input(s): BNP in the last 8760 hours.  ProBNP (last 3 results) No results for input(s): PROBNP in the last 8760 hours.   D-Dimer No results for input(s): DDIMER in the last 72 hours. Hemoglobin A1C No results for input(s): HGBA1C in the last 72 hours. Fasting Lipid Panel No results for input(s): CHOL, HDL, LDLCALC, TRIG, CHOLHDL, LDLDIRECT in the last 72 hours. Thyroid Function Tests No results for input(s): TSH, T4TOTAL, T3FREE, THYROIDAB in the last 72 hours.  Invalid input(s): FREET3  Other results:   Imaging    DG CHEST PORT 1 VIEW  Result Date: 07/26/2020 CLINICAL DATA:  Congestive heart failure. EXAM: PORTABLE CHEST 1 VIEW COMPARISON:  Jul 23, 2020. FINDINGS: The heart size and mediastinal contours are within normal limits. Endotracheal and feeding tubes are in good position. Right internal jugular catheter  is unchanged. No pneumothorax is noted. Stable bibasilar atelectasis or edema is noted with associated pleural effusions. The visualized skeletal structures are unremarkable. IMPRESSION: Stable support apparatus. Stable bibasilar opacities as described above. Electronically Signed   By: Marijo Conception M.D.   On: 07/26/2020 08:19     Medications:     Scheduled Medications: . amiodarone  200 mg Per Tube BID  . aspirin  81 mg Per Tube Daily  . chlorhexidine gluconate (MEDLINE KIT)  15 mL Mouth Rinse BID  . Chlorhexidine Gluconate Cloth  6 each Topical Daily  . docusate  100 mg Per Tube BID  . enoxaparin (LOVENOX) injection  40 mg Subcutaneous Daily  .  HYDROmorphone (DILAUDID) injection  0.5 mg Intravenous Once  . insulin aspart  0-15 Units Subcutaneous Q4H  . mouth rinse  15 mL Mouth Rinse 10 times per day  . midodrine  5 mg Per Tube TID WC  . polyethylene glycol  17 g Per Tube Daily  . rosuvastatin  40 mg Per Tube Daily  . sodium chloride flush  10-40 mL Intracatheter Q12H  . sodium chloride flush  3 mL Intravenous Q12H  . ticagrelor  90 mg Per Tube BID    Infusions: . sodium chloride Stopped (07/21/20 0707)  . sodium chloride    . sodium chloride Stopped (07/27/20 0502)  . sodium chloride    . dexmedetomidine (PRECEDEX) IV infusion 1.6 mcg/kg/hr (07/27/20 0600)  . famotidine (PEPCID) IV Stopped (07/26/20 2158)  . feeding supplement (VITAL AF 1.2 CAL) 55 mL/hr at 07/27/20 0600  . HYDROmorphone 4 mg/hr (07/27/20 0600)  . milrinone 0.375 mcg/kg/min (07/27/20 0600)  . norepinephrine (LEVOPHED) Adult infusion 20 mcg/min (07/27/20 0600)  . piperacillin-tazobactam (ZOSYN)  IV 12.5 mL/hr at 07/27/20 0600  . vasopressin 0.04 Units/min (07/27/20 0600)    PRN Medications: sodium chloride, Place/Maintain arterial line **AND** sodium chloride, acetaminophen, HYDROmorphone, midazolam, ondansetron (ZOFRAN) IV, sodium chloride flush, sodium chloride flush    Assessment/Plan   1. CAD:  Anterior STEMI with occluded proximal LAD.  Treated with DES but complicated by embolization to distal LAD (procedure completed with occluded distal LAD).    - Continue ticagrelor and ASA 81.  - Crestor 40 daily. 2. Cardiogenic shock: Ischemic cardiomyopathy. Echo with EF 30-35%, no LV thrombus, LAD territory WMAs, RV normal, IVC normal. Currently on NE 18, milrinone 0.375, vasopressin 0.04.  Co-ox 57%.  CVP 12 with I/Os negative.  Difficulty weaning pressors due to drop in BP with sedation.  - Continue  milrinone 0.375.  - Continue to work on weaning pressors, further titrate midodrine to 10 tid to facilitate wean  - Increase Lasix to 60 mg bid  3. Acute hypoxemic respiratory failure: Aspiration PNA (lower lobe opacities on CXR) + pulmonary edema. WBCs 21 => 16.9 => 14=>13 Tm 100.   - On Zosyn per CCM. Will stop after today  - Vent per CCM.  4. Cardiac arrest: VF arrest associated with STEMI. Patient shocked x 5, 15 min CPR before ROSC.  No further VT.  Now rewarmed s/p hypothermia protocol.  He has been revascularized.  - Lifevest if recovers to assess for recovery of EF.  5. Neuro: EEG with severe diffuse encephalopathy.  CT head showed chronic microvascular changes.  Wakes up agitated, does have purposeful motion (does not speak [place]).  - Wean sedation with family around.  6. Anemia: Transfused 1 U yesterday, Hgb 7.3>>8.6  but no overt bleeding noticed.  7. Atrial fibrillation: I have not seen any actual atrial fibrillation, has had ST with PACs.  Currently in NSR.  - Lovenox prophy - po amiodarone.   Lyda Jester, PA-C  07/27/2020 7:24 AM  Length of Stay: 6  Brittainy Simmons, [place]   Patient seen with PA, agree with the above note.    Slow weaning of pressors, currently on milrinone 0.375, NE 18, vasopressin 0.04.  Remains on Zosyn for aspiration PNA.  Good diuresis yesterday with Lasix 60 mg IV x 1, I/Os net negative 1 L with weight down.  Co-ox 57%, CVP 7-8 on my read.    General: Sedated on vent.  Neck: No JVD, no thyromegaly or thyroid nodule.  Lungs: Clear to auscultation bilaterally with normal respiratory effort. CV: Nondisplaced PMI.  Heart regular S1/S2, no S3/S4, no murmur.  No peripheral edema.   Abdomen: Soft, nontender, no hepatosplenomegaly, no distention.  Skin: Intact without lesions or rashes.  Neurologic: Sedated but will awaken and move all limbs. Extremities: No clubbing or cyanosis.  HEENT: Normal.   BP stable this morning.  Continue milrinone and wean NE and vasopressin.  Try to keep sedation light as BP responds markedly to sedation.   CVP 7-8, has responded well to Lasix boluses, will give him 60 mg IV x 1 again today.   Continuing Zosyn for aspiration coverage.   CRITICAL CARE Performed by: Loralie Champagne  Total critical care time: 35 minutes  Critical care time was exclusive of separately billable procedures and treating other patients.  Critical care was necessary to treat or prevent imminent or life-threatening deterioration.  Critical care was time spent personally by me on the following activities: development of treatment plan with patient and/or surrogate as well as nursing, discussions with consultants, evaluation of patient's response to treatment, examination of patient, obtaining history from patient or surrogate, ordering and performing treatments and interventions, ordering and review of laboratory studies, ordering and review of radiographic studies, pulse oximetry and re-evaluation of patient's condition.  Loralie Champagne 07/27/2020 7:58 AM  Advanced Heart Failure Team Pager [phone] (M-F; 7a - 5p)  Please contact [facility] for night-coverage after hours (5p -7a ) and weekends on [email or web address]

## 2020-07-28 DIAGNOSIS — I2102 ST elevation (STEMI) myocardial infarction involving left anterior descending coronary artery: Secondary | ICD-10-CM | POA: Diagnosis not present

## 2020-07-28 DIAGNOSIS — R570 Cardiogenic shock: Secondary | ICD-10-CM | POA: Diagnosis not present

## 2020-07-28 LAB — PROTIME-INR
INR: 1.1 (ref 0.8–1.2)
Prothrombin Time: 13.9 seconds (ref 11.4–15.2)

## 2020-07-28 LAB — CBC
HCT: 25.9 % — ABNORMAL LOW (ref 39.0–52.0)
Hemoglobin: 8.5 g/dL — ABNORMAL LOW (ref 13.0–17.0)
MCH: 30.7 pg (ref 26.0–34.0)
MCHC: 32.8 g/dL (ref 30.0–36.0)
MCV: 93.5 fL (ref 80.0–100.0)
Platelets: 282 10*3/uL (ref 150–400)
RBC: 2.77 MIL/uL — ABNORMAL LOW (ref 4.22–5.81)
RDW: 16.5 % — ABNORMAL HIGH (ref 11.5–15.5)
WBC: 14.5 10*3/uL — ABNORMAL HIGH (ref 4.0–10.5)
nRBC: 0.2 % (ref 0.0–0.2)

## 2020-07-28 LAB — COOXEMETRY PANEL
Carboxyhemoglobin: 1.5 % (ref 0.5–1.5)
Methemoglobin: 1.2 % (ref 0.0–1.5)
O2 Saturation: 68.2 %
Total hemoglobin: 8.7 g/dL — ABNORMAL LOW (ref 12.0–16.0)

## 2020-07-28 LAB — CULTURE, RESPIRATORY W GRAM STAIN

## 2020-07-28 LAB — COMPREHENSIVE METABOLIC PANEL
ALT: 150 U/L — ABNORMAL HIGH (ref 0–44)
AST: 176 U/L — ABNORMAL HIGH (ref 15–41)
Albumin: 2.1 g/dL — ABNORMAL LOW (ref 3.5–5.0)
Alkaline Phosphatase: 108 U/L (ref 38–126)
Anion gap: 10 (ref 5–15)
BUN: 37 mg/dL — ABNORMAL HIGH (ref 8–23)
CO2: 26 mmol/L (ref 22–32)
Calcium: 8 mg/dL — ABNORMAL LOW (ref 8.9–10.3)
Chloride: 103 mmol/L (ref 98–111)
Creatinine, Ser: 1.42 mg/dL — ABNORMAL HIGH (ref 0.61–1.24)
GFR, Estimated: 54 mL/min — ABNORMAL LOW (ref >60)
Glucose, Bld: 181 mg/dL — ABNORMAL HIGH (ref 70–99)
Potassium: 3.8 mmol/L (ref 3.5–5.1)
Sodium: 139 mmol/L (ref 135–145)
Total Bilirubin: 0.8 mg/dL (ref 0.3–1.2)
Total Protein: 5.7 g/dL — ABNORMAL LOW (ref 6.5–8.1)

## 2020-07-28 LAB — GLUCOSE, CAPILLARY
Glucose-Capillary: 133 mg/dL — ABNORMAL HIGH (ref 70–99)
Glucose-Capillary: 173 mg/dL — ABNORMAL HIGH (ref 70–99)
Glucose-Capillary: 156 mg/dL — ABNORMAL HIGH (ref 70–99)
Glucose-Capillary: 210 mg/dL — ABNORMAL HIGH (ref 70–99)
Glucose-Capillary: 128 mg/dL — ABNORMAL HIGH (ref 70–99)
Glucose-Capillary: 156 mg/dL — ABNORMAL HIGH (ref 70–99)

## 2020-07-28 MED ORDER — HYDROMORPHONE BOLUS VIA INFUSION
0.2500 mg | INTRAVENOUS | Status: DC | PRN
  Administered 2020-07-28: 1 mg via INTRAVENOUS
  Administered 2020-07-29: 0.5 mg via INTRAVENOUS
  Administered 2020-07-29 (×2): 1 mg via INTRAVENOUS
  Administered 2020-07-30: 2 mg via INTRAVENOUS
  Administered 2020-07-30 (×4): 1 mg via INTRAVENOUS
  Administered 2020-07-30: 2 mg via INTRAVENOUS
  Administered 2020-07-30: 1 mg via INTRAVENOUS
  Administered 2020-07-31: 2 mg via INTRAVENOUS
  Filled 2020-07-28: qty 2

## 2020-07-28 MED ORDER — MIDAZOLAM HCL 2 MG/2ML IJ SOLN
0.5000 mg | INTRAMUSCULAR | Status: DC | PRN
  Administered 2020-07-28 – 2020-07-30 (×11): 2 mg via INTRAVENOUS
  Filled 2020-07-28 (×10): qty 2

## 2020-07-28 MED ORDER — ENOXAPARIN SODIUM 30 MG/0.3ML IJ SOSY
30.0000 mg | PREFILLED_SYRINGE | Freq: Every day | INTRAMUSCULAR | Status: DC
  Administered 2020-07-29 – 2020-08-10 (×13): 30 mg via SUBCUTANEOUS
  Filled 2020-07-28 (×13): qty 0.3

## 2020-07-28 MED ORDER — MIDAZOLAM HCL 2 MG/2ML IJ SOLN
INTRAMUSCULAR | Status: AC
  Filled 2020-07-28: qty 2

## 2020-07-28 MED ORDER — MIDAZOLAM HCL 2 MG/2ML IJ SOLN
2.0000 mg | Freq: Once | INTRAMUSCULAR | Status: AC
  Administered 2020-07-28: 2 mg via INTRAVENOUS
  Filled 2020-07-28: qty 2

## 2020-07-28 NOTE — Progress Notes (Signed)
NAMETheon Horton, MRN:  [id number], DOB:  1955/05/24, LOS: 7 ADMISSION DATE:  07/21/2020, CONSULTATION DATE:  07/22/2019 REFERRING MD:  [place], CHIEF COMPLAINT:  Post arrest   History of Present Illness:  Patient is a 65 year old male with limited medical history found down by family, EMS was called, he was found to be in V. fib shocked 5 x 3 mg epinephrine 450 mg of amiodarone with ROSC obtained.  He was emergently taken to the Cath Lab showing ST segment changes in anterior lateral leads status post revascularization of LAD.  He was noted to have been moving spontaneously at time of cardiac catheterization.  Transferred to the ICU.  PCCM consulted for help with management  Pertinent  Medical History    has a past medical history of Cardiac arrest [facility]), Coronary artery disease, Dysrhythmia, and Myocardial infarction (HCC).  Significant Hospital Events: Including procedures, antibiotic start and stop dates in addition to other pertinent events    5/21 Vfib arrest, Cardiac catheterization, TTM started, right IJ placed  5/22 remains on NE/ milrinone, sedated on fentanyl versed   5/23 aline placed, concern for afib controlled on IV amio, f/c this am with family  5/24 ongoing agitation, pressor requirements  5/25  Issues with sedation overnight, Remains on significant vasopressor support  5/26 Remains on significant vasopressor support, started on midodrine   5/21 MRSA neg 5/22 BCx2 >> neg 5/26 trach asp >>  5/21 zosyn >> (last dose 5/27)  Interim History / Subjective:   Remains critically ill intubated on mechanical life support.  Still remains in shock on vasopressors.  Objective   Blood pressure 136/74, pulse (!) 113, temperature 99.14 F (37.3 C), resp. rate (!) 27, height 5' 4.02" (1.626 m), weight 46.2 kg, SpO2 97 %. CVP:  [9 mmHg-11 mmHg] 9 mmHg  Vent Mode: PSV;CPAP FiO2 (%):  [30 %] 30 % Set Rate:  [14 bmp] 14 bmp Vt Set:  [470 mL] 470 mL PEEP:  [5 cmH20] 5  cmH20 Pressure Support:  [5 cmH20-12 cmH20] 5 cmH20 Plateau Pressure:  [15 cmH20-20 cmH20] 20 cmH20   Intake/Output Summary (Last 24 hours) at 07/28/2020 0854 Last data filed at 07/28/2020 0700 Gross per 24 hour  Intake 2948.24 ml  Output 2550 ml  Net 398.24 ml   Filed Weights   07/26/20 0500 07/27/20 0422 07/28/20 0329  Weight: 56.4 kg 53.3 kg 46.2 kg    Examination:   General: Thin, critically ill adult intubated on mechanical life support HEENT: Mucous membranes moist, endotracheal tube in place, left nasal core track, temporalis wasting Neuro: Opens eyes to voice and stimulation withdraws to pain CV: Regular rate rhythm, S1-S2 PULM: Bilateral mechanically ventilated breath sounds GI: Soft, nontender nondistended Extremities: No significant edema, thin extremities Skin: No rash   Labs/imaging that I have personally reviewed    Chest x-ray 5/26 with stable bibasilar atelectasis/edema   [place] RUQ. Hepatic parenchymal echogenicity, borderline gallbladder thickness, no gallstones  CBC, CMET, coox, coags, no CXR  Resolved Hospital Problem list     Assessment & Plan:   Cardiac arrest resulting in development of cardiogenic shock -V. fib arrest associated with STEMI, patient shocked 5 times with 15 minutes of CPR prior to ROSC Status post STEMI post LAD stent, cardiogenic shock -Emergent cath completed 5/21 occluded proximal LAD status post DES.  Per heart failure DES embolized and again LAD is completely occluded Cardiomyopathy EF 30-35 -Echo 5/21 with EF 30 to 35%, coarse apical trabeculation and calcified LV apical false  tendon, LV with regional wall motion abnormality and grade 1 diastolic dysfunction New onset atrial fibrillation- currently NSR w/PACs P: Continue aspirin and ticagrelor plus Crestor Remains on milrinone plus norepinephrine Titrate to maintain mean arterial pressure greater than 65 mmHg Continue diuresis Follow I's and O's Midodrine 10mg  3 times  daily Remains on amiodarone His shock state is slowly improving. But still due to his high pressor requirements am not sure he will be able to maintain if we take him off of the ventilator.  We will discuss with heart failure service  Acute respiratory failure secondary to cardiac arrest, MI Right-sided aspiration pneumonia Pulmonary edema P:  Remains on full vent support Adult mechanical vent protocol PAD guideline for sedation On Dilaudid instead of fentanyl.  This is seemingly to work better. Continue to wean as tolerated goal RASS -1  AKI -Unknown baseline, creatinine continues to downtrend P: Good urine output with diuresis Continue to follow urine output and electrolytes Follow kidney function  Elevated transaminases secondary to shock liver -LFT initially improved but are slowly uptrending in the setting of continued cardiogenic shock  - RUQ 5/25. Hepatic parenchymal echogenicity, borderline gallbladder thickness, no gallstones P: Slowly improving LFTs Observe  Acute encephalopathy, metabolic -Secondary to cardiac arrest -In the setting of cardiac arrest concern for anoxic injury however CT head negative and morning of 5/23 patient is able to follow commands when spoken in native language Continuous EEG stopped 5/23, thus far showing diffuse encephalopathy, no seizures P: Off sedation follows commands. Overall improved Continue to observe supportive care  Hypokalemia P: Replete as needed  At risk malnutrition  P: Tube feeds continued  Best practice   Diet:  NPO, TF at goal  Pain/Anxiety/Delirium protocol (if indicated): Yes (RASS goal -1) VAP protocol (if indicated): Yes DVT prophylaxis: LMWH and SCD GI prophylaxis: H2B Glucose control:  SSI moderate Central venous access:  Yes, and it is still needed Arterial line:  Yes. Placed 5/23.  Not working 5/27, will remove Foley:  Yes, and it is still needed Mobility:  bed rest  PT consulted: N/A Last date  of multidisciplinary goals of care discussion: Pending.  Code Status:  full code Disposition: ICU  This patient is critically ill with multiple organ system failure; which, requires frequent high complexity decision making, assessment, support, evaluation, and titration of therapies. This was completed through the application of advanced monitoring technologies and extensive interpretation of multiple databases. During this encounter critical care time was devoted to patient care services described in this note for 33 minutes.  6/27, DO [facility] 07/28/2020 8:54 AM

## 2020-07-28 NOTE — Progress Notes (Signed)
eLink Physician-Brief Progress Note Patient Name: Vincent Horton DOB: 03-15-1955 MRN: [id number]   Date of Service  07/28/2020  HPI/Events of Note  Patient tachycardic up to heart rate of 160 and tachypneic despite a 4 mg Dilaudid  Infusion and Precedex gtt at 1.6 mcg.  eICU Interventions  Versed 2 mg iv x 1 ordered.        Thomasene Lot Johari Bennetts 07/28/2020, 4:45 AM

## 2020-07-28 NOTE — Progress Notes (Signed)
Patient ID: Vincent Horton, male   DOB: Jul 19, 1955, 65 y.o.   MRN: [id number] P    Advanced Heart Failure Rounding Note  PCP-Cardiologist: None   Subjective:     Remains sedated on vent. Remains on NE 18 -> 11 + Milrinone 0.375 + VP 0.03. MAPs upper 50-70s. Co-ox 68%. Good diuresis on IV lasix CVP 10   Remains febrile to 101.1 on zosyn for possible aspiration PNA.  EEG with severe diffuse encephalopathy, no seizures. CT head with chronic microvascular changes.   Echo: EF 30-35%, no LV thrombus, LAD territory WMAs, RV normal, IVC normal.    Objective:   Weight Range: 46.2 kg Body mass index is 17.47 kg/m.   Vital Signs:   Temp:  [97.7 F (36.5 C)-101.12 F (38.4 C)] 100.04 F (37.8 C) (05/28 0937) Pulse Rate:  [76-133] 96 (05/28 0937) Resp:  [10-27] 14 (05/28 0937) BP: (77-136)/(54-102) 136/74 (05/28 0800) SpO2:  [86 %-100 %] 97 % (05/28 0937) FiO2 (%):  [30 %] 30 % (05/28 0754) Weight:  [46.2 kg] 46.2 kg (05/28 0329) Last BM Date: 07/28/20  Weight change: Filed Weights   07/26/20 0500 07/27/20 0422 07/28/20 0329  Weight: 56.4 kg 53.3 kg 46.2 kg    Intake/Output:   Intake/Output Summary (Last 24 hours) at 07/28/2020 1023 Last data filed at 07/28/2020 0700 Gross per 24 hour  Intake 2566.68 ml  Output 2285 ml  Net 281.68 ml      Physical Exam    General:  Thin ill appearing male on vent  Sedated.  HEENT: normal + ETT and cor-trak Neck: supple. no JVD. Carotids 2+ bilat; no bruits. No lymphadenopathy or thryomegaly appreciated. Cor: PMI nondisplaced. Regular rate & rhythm. No rubs, gallops or murmurs. Lungs: clear Abdomen: soft, nontender, nondistended. No hepatosplenomegaly. No bruits or masses. Good bowel sounds. Extremities: no cyanosis, clubbing, rash, edema Neuro: intubated/sedated   Telemetry   NSR 90s Personally reviewed   Labs    CBC Recent Labs    07/27/20 0408 07/28/20 0319  WBC 13.7* 14.5*  HGB 8.6* 8.5*  HCT 25.6* 25.9*  MCV 90.8 93.5   PLT 236 409   Basic Metabolic Panel Recent Labs    07/27/20 0408 07/28/20 0319  NA 139 139  K 4.1 3.8  CL 110 103  CO2 24 26  GLUCOSE 205* 181*  BUN 30* 37*  CREATININE 1.28* 1.42*  CALCIUM 7.8* 8.0*  MG 2.1  --    Liver Function Tests Recent Labs    07/27/20 0408 07/28/20 0319  AST 244* 176*  ALT 163* 150*  ALKPHOS 97 108  BILITOT 0.8 0.8  PROT 5.2* 5.7*  ALBUMIN 2.1* 2.1*   No results for input(s): LIPASE, AMYLASE in the last 72 hours. Cardiac Enzymes No results for input(s): CKTOTAL, CKMB, CKMBINDEX, TROPONINI in the last 72 hours.  BNP: BNP (last 3 results) No results for input(s): BNP in the last 8760 hours.  ProBNP (last 3 results) No results for input(s): PROBNP in the last 8760 hours.   D-Dimer No results for input(s): DDIMER in the last 72 hours. Hemoglobin A1C No results for input(s): HGBA1C in the last 72 hours. Fasting Lipid Panel No results for input(s): CHOL, HDL, LDLCALC, TRIG, CHOLHDL, LDLDIRECT in the last 72 hours. Thyroid Function Tests No results for input(s): TSH, T4TOTAL, T3FREE, THYROIDAB in the last 72 hours.  Invalid input(s): FREET3  Other results:   Imaging    No results found.   Medications:     Scheduled Medications: .  midazolam      . amiodarone  200 mg Per Tube BID  . aspirin  81 mg Per Tube Daily  . chlorhexidine gluconate (MEDLINE KIT)  15 mL Mouth Rinse BID  . Chlorhexidine Gluconate Cloth  6 each Topical Daily  . clonazepam  0.5 mg Per Tube TID  . docusate  100 mg Per Tube BID  . enoxaparin (LOVENOX) injection  40 mg Subcutaneous Daily  .  HYDROmorphone (DILAUDID) injection  0.5 mg Intravenous Once  . insulin aspart  0-15 Units Subcutaneous Q4H  . mouth rinse  15 mL Mouth Rinse 10 times per day  . midodrine  10 mg Per Tube TID WC  . oxyCODONE  10 mg Per Tube Q8H  . polyethylene glycol  17 g Per Tube Daily  . rosuvastatin  40 mg Per Tube Daily  . sodium chloride flush  10-40 mL Intracatheter Q12H  .  sodium chloride flush  3 mL Intravenous Q12H  . ticagrelor  90 mg Per Tube BID    Infusions: . sodium chloride Stopped (07/21/20 0707)  . sodium chloride    . sodium chloride 10 mL/hr at 07/28/20 0700  . sodium chloride    . dexmedetomidine (PRECEDEX) IV infusion 1.6 mcg/kg/hr (07/28/20 0828)  . famotidine (PEPCID) IV 20 mg (07/28/20 0933)  . feeding supplement (VITAL AF 1.2 CAL) 55 mL/hr at 07/28/20 0600  . HYDROmorphone 0.5 mg/hr (07/28/20 0700)  . milrinone 0.375 mcg/kg/min (07/28/20 0700)  . norepinephrine (LEVOPHED) Adult infusion 11 mcg/min (07/28/20 0700)  . vasopressin 0.03 Units/min (07/28/20 0937)    PRN Medications: sodium chloride, Place/Maintain arterial line **AND** sodium chloride, acetaminophen (TYLENOL) oral liquid 160 mg/5 mL, HYDROmorphone, metoCLOPramide, midazolam, ondansetron (ZOFRAN) IV, sodium chloride flush, sodium chloride flush    Assessment/Plan   1. CAD: Anterior STEMI with occluded proximal LAD.  Treated with DES but complicated by embolization to distal LAD (procedure completed with occluded distal LAD).    - Continue ticagrelor and ASA 81.  - Crestor 40 daily. 2. Cardiogenic shock: Ischemic cardiomyopathy. Echo with EF 30-35%, no LV thrombus, LAD territory WMAs, RV normal, IVC normal. Currently on NE 11, milrinone 0.375, vasopressin 0.03.  Co-ox 68%.  CVP 10 with I/Os negative.  Difficulty weaning pressors due to drop in BP with sedation.  - Continue milrinone 0.375.  - Continue to work on weaning pressors, Continue midodrine 10 tid to facilitate wean  - CVP 9-10. Lasix stopped due to mild AKI 3. Acute hypoxemic respiratory failure: Aspiration PNA (lower lobe opacities on CXR) + pulmonary edema. WBCs 21 => 16.9 => 14=>13 Tm 100.   - Finished zosyn today (5/29) - Vent wean per CCM.  4. Cardiac arrest: VF arrest associated with STEMI. Patient shocked x 5, 15 min CPR before ROSC.  No further VT.  Now rewarmed s/p hypothermia protocol.  He has been  revascularized.  - Lifevest if recovers to assess for recovery of EF.  5. Neuro: EEG with severe diffuse encephalopathy.  CT head showed chronic microvascular changes.  Wakes up agitated, does have purposeful motion (does not speak [place]).  - Wean sedation with family around.  6. Anemia: Transfused 1 U yesterday, Hgb 7.3>>8.6  but no overt bleeding noticed - hgb 8.5 today .  7. Paroxysmal Atrial fibrillation: We have not seen any actual atrial fibrillation, has had ST with PACs.  Currently in NSR.  - Lovenox prophy - po amiodarone.   CRITICAL CARE Performed by: Glori Bickers  Total critical care time: 35 minutes  Critical care time was exclusive of separately billable procedures and treating other patients.  Critical care was necessary to treat or prevent imminent or life-threatening deterioration.  Critical care was time spent personally by me (independent of midlevel providers or residents) on the following activities: development of treatment plan with patient and/or surrogate as well as nursing, discussions with consultants, evaluation of patient's response to treatment, examination of patient, obtaining history from patient or surrogate, ordering and performing treatments and interventions, ordering and review of laboratory studies, ordering and review of radiographic studies, pulse oximetry and re-evaluation of patient's condition.    Glori Bickers, MD 07/28/2020 10:23 AM  Length of Stay: 7

## 2020-07-28 NOTE — Care Management (Addendum)
Consult- Lovenox Patient has coverage with Medicare and Medicaid. Lovenox is on formulary with Medicaid. Price ~$4  Please fill meds through [facility] pharmacy at discharge to ensure availablity.

## 2020-07-29 DIAGNOSIS — I2102 ST elevation (STEMI) myocardial infarction involving left anterior descending coronary artery: Secondary | ICD-10-CM | POA: Diagnosis not present

## 2020-07-29 DIAGNOSIS — J9601 Acute respiratory failure with hypoxia: Secondary | ICD-10-CM | POA: Diagnosis not present

## 2020-07-29 DIAGNOSIS — I469 Cardiac arrest, cause unspecified: Secondary | ICD-10-CM | POA: Diagnosis not present

## 2020-07-29 DIAGNOSIS — R570 Cardiogenic shock: Secondary | ICD-10-CM | POA: Diagnosis not present

## 2020-07-29 LAB — COMPREHENSIVE METABOLIC PANEL
ALT: 121 U/L — ABNORMAL HIGH (ref 0–44)
AST: 116 U/L — ABNORMAL HIGH (ref 15–41)
Albumin: 2 g/dL — ABNORMAL LOW (ref 3.5–5.0)
Alkaline Phosphatase: 101 U/L (ref 38–126)
Anion gap: 4 — ABNORMAL LOW (ref 5–15)
BUN: 32 mg/dL — ABNORMAL HIGH (ref 8–23)
CO2: 25 mmol/L (ref 22–32)
Calcium: 7.9 mg/dL — ABNORMAL LOW (ref 8.9–10.3)
Chloride: 110 mmol/L (ref 98–111)
Creatinine, Ser: 1.21 mg/dL (ref 0.61–1.24)
GFR, Estimated: >60 mL/min (ref >60)
Glucose, Bld: 195 mg/dL — ABNORMAL HIGH (ref 70–99)
Potassium: 3.7 mmol/L (ref 3.5–5.1)
Sodium: 139 mmol/L (ref 135–145)
Total Bilirubin: 0.8 mg/dL (ref 0.3–1.2)
Total Protein: 5.5 g/dL — ABNORMAL LOW (ref 6.5–8.1)

## 2020-07-29 LAB — GLUCOSE, CAPILLARY
Glucose-Capillary: 116 mg/dL — ABNORMAL HIGH (ref 70–99)
Glucose-Capillary: 129 mg/dL — ABNORMAL HIGH (ref 70–99)
Glucose-Capillary: 132 mg/dL — ABNORMAL HIGH (ref 70–99)
Glucose-Capillary: 161 mg/dL — ABNORMAL HIGH (ref 70–99)
Glucose-Capillary: 174 mg/dL — ABNORMAL HIGH (ref 70–99)
Glucose-Capillary: 181 mg/dL — ABNORMAL HIGH (ref 70–99)
Glucose-Capillary: 187 mg/dL — ABNORMAL HIGH (ref 70–99)

## 2020-07-29 LAB — COOXEMETRY PANEL
Carboxyhemoglobin: 1.4 % (ref 0.5–1.5)
Carboxyhemoglobin: 1.3 % (ref 0.5–1.5)
Methemoglobin: 0.9 % (ref 0.0–1.5)
Methemoglobin: 1 % (ref 0.0–1.5)
O2 Saturation: 58.3 %
O2 Saturation: 63.1 %
Total hemoglobin: 10.1 g/dL — ABNORMAL LOW (ref 12.0–16.0)
Total hemoglobin: 15.3 g/dL (ref 12.0–16.0)

## 2020-07-29 LAB — PROTIME-INR
INR: 1.2 (ref 0.8–1.2)
Prothrombin Time: 15 seconds (ref 11.4–15.2)

## 2020-07-29 MED ORDER — FUROSEMIDE 10 MG/ML IJ SOLN
60.0000 mg | Freq: Once | INTRAMUSCULAR | Status: AC
  Administered 2020-07-29: 60 mg via INTRAVENOUS
  Filled 2020-07-29: qty 6

## 2020-07-29 MED ORDER — POTASSIUM CHLORIDE 20 MEQ PO PACK
40.0000 meq | PACK | Freq: Two times a day (BID) | ORAL | Status: AC
  Administered 2020-07-29 (×2): 40 meq
  Filled 2020-07-29 (×2): qty 2

## 2020-07-29 MED ORDER — MIDODRINE HCL 5 MG PO TABS
15.0000 mg | ORAL_TABLET | Freq: Three times a day (TID) | ORAL | Status: DC
  Administered 2020-07-29 – 2020-07-31 (×8): 15 mg
  Filled 2020-07-29 (×8): qty 3

## 2020-07-29 MED ORDER — MILRINONE LACTATE IN DEXTROSE 20-5 MG/100ML-% IV SOLN
0.2500 ug/kg/min | INTRAVENOUS | Status: DC
  Filled 2020-07-29 (×2): qty 100

## 2020-07-29 NOTE — Progress Notes (Signed)
Patient ID: Vincent Horton, male   DOB: 1955-05-20, 65 y.o.   MRN: [id number] P    Advanced Heart Failure Rounding Note  PCP-Cardiologist: None   Subjective:     Awake on vent. Weaning. Will follow commands  Remains on  NE 18, milrinone 0.375 VP 0.03  Co-ox 58% CVP 12  Echo: EF 30-35%, no LV thrombus, LAD territory WMAs, RV normal, IVC normal.    Objective:   Weight Range: 46.2 kg Body mass index is 17.47 kg/m.   Vital Signs:   Temp:  [97.7 F (36.5 C)-100.9 F (38.3 C)] 98.6 F (37 C) (05/29 0811) Pulse Rate:  [71-112] 98 (05/29 0811) Resp:  [11-26] 24 (05/29 0811) BP: (86-153)/(52-78) 113/68 (05/29 0645) SpO2:  [93 %-100 %] 96 % (05/29 0811) FiO2 (%):  [30 %] 30 % (05/29 0811) Last BM Date: 07/28/20  Weight change: Filed Weights   07/26/20 0500 07/27/20 0422 07/28/20 0329  Weight: 56.4 kg 53.3 kg 46.2 kg    Intake/Output:   Intake/Output Summary (Last 24 hours) at 07/29/2020 1002 Last data filed at 07/29/2020 0601 Gross per 24 hour  Intake 1418.15 ml  Output 2100 ml  Net -681.85 ml      Physical Exam    General:  Thin ill appearing male on vent  Awake follows some commands (doesn't speak English well per report).  HEENT: normal + ETT and cor-trak Neck: supple. + JVP  Carotids 2+ bilat; no bruits. No lymphadenopathy or thryomegaly appreciated. Cor: PMI nondisplaced. Regular rate & rhythm. No rubs, gallops or murmurs. Lungs: clear Abdomen: soft, nontender, nondistended. No hepatosplenomegaly. No bruits or masses. Good bowel sounds. Extremities: no cyanosis, clubbing, rash, edema + boots Neuro:as above  Telemetry   NSR 90-100 Personally reviewed  Labs    CBC Recent Labs    07/27/20 0408 07/28/20 0319  WBC 13.7* 14.5*  HGB 8.6* 8.5*  HCT 25.6* 25.9*  MCV 90.8 93.5  PLT 236 761   Basic Metabolic Panel Recent Labs    07/27/20 0408 07/28/20 0319 07/29/20 0510  NA 139 139 139  K 4.1 3.8 3.7  CL 110 103 110  CO2 24 26 25   GLUCOSE 205* 181* 195*   BUN 30* 37* 32*  CREATININE 1.28* 1.42* 1.21  CALCIUM 7.8* 8.0* 7.9*  MG 2.1  --   --    Liver Function Tests Recent Labs    07/28/20 0319 07/29/20 0510  AST 176* 116*  ALT 150* 121*  ALKPHOS 108 101  BILITOT 0.8 0.8  PROT 5.7* 5.5*  ALBUMIN 2.1* 2.0*   No results for input(s): LIPASE, AMYLASE in the last 72 hours. Cardiac Enzymes No results for input(s): CKTOTAL, CKMB, CKMBINDEX, TROPONINI in the last 72 hours.  BNP: BNP (last 3 results) No results for input(s): BNP in the last 8760 hours.  ProBNP (last 3 results) No results for input(s): PROBNP in the last 8760 hours.   D-Dimer No results for input(s): DDIMER in the last 72 hours. Hemoglobin A1C No results for input(s): HGBA1C in the last 72 hours. Fasting Lipid Panel No results for input(s): CHOL, HDL, LDLCALC, TRIG, CHOLHDL, LDLDIRECT in the last 72 hours. Thyroid Function Tests No results for input(s): TSH, T4TOTAL, T3FREE, THYROIDAB in the last 72 hours.  Invalid input(s): FREET3  Other results:   Imaging    No results found.   Medications:     Scheduled Medications: . amiodarone  200 mg Per Tube BID  . aspirin  81 mg Per Tube Daily  .  chlorhexidine gluconate (MEDLINE KIT)  15 mL Mouth Rinse BID  . Chlorhexidine Gluconate Cloth  6 each Topical Daily  . clonazepam  0.5 mg Per Tube TID  . docusate  100 mg Per Tube BID  . enoxaparin (LOVENOX) injection  30 mg Subcutaneous Daily  .  HYDROmorphone (DILAUDID) injection  0.5 mg Intravenous Once  . insulin aspart  0-15 Units Subcutaneous Q4H  . mouth rinse  15 mL Mouth Rinse 10 times per day  . midodrine  10 mg Per Tube TID WC  . oxyCODONE  10 mg Per Tube Q8H  . polyethylene glycol  17 g Per Tube Daily  . rosuvastatin  40 mg Per Tube Daily  . sodium chloride flush  10-40 mL Intracatheter Q12H  . sodium chloride flush  3 mL Intravenous Q12H  . ticagrelor  90 mg Per Tube BID    Infusions: . sodium chloride Stopped (07/21/20 0707)  . sodium  chloride    . sodium chloride 10 mL/hr at 07/29/20 0609  . sodium chloride    . dexmedetomidine (PRECEDEX) IV infusion 1.4 mcg/kg/hr (07/29/20 7829)  . famotidine (PEPCID) IV Stopped (07/28/20 2122)  . feeding supplement (VITAL AF 1.2 CAL) 1,000 mL (07/28/20 2059)  . HYDROmorphone 1.5 mg/hr (07/29/20 0609)  . milrinone 0.375 mcg/kg/min (07/29/20 0609)  . norepinephrine (LEVOPHED) Adult infusion 18 mcg/min (07/29/20 0609)  . vasopressin 0.03 Units/min (07/29/20 0609)    PRN Medications: sodium chloride, Place/Maintain arterial line **AND** sodium chloride, acetaminophen (TYLENOL) oral liquid 160 mg/5 mL, HYDROmorphone, metoCLOPramide, midazolam, ondansetron (ZOFRAN) IV, sodium chloride flush, sodium chloride flush    Assessment/Plan   1. CAD: Anterior STEMI with occluded proximal LAD.  Treated with DES but complicated by embolization to distal LAD (procedure completed with occluded distal LAD).    - Continue ticagrelor and ASA 81.  - Crestor 40 daily. 2. Cardiogenic shock: Ischemic cardiomyopathy. Echo with EF 30-35%, no LV thrombus, LAD territory WMAs, RV normal, IVC normal. Currently on NE 11, milrinone 0.375, vasopressin 0.03.  Co-ox 68%.  CVP 12 with I/Os negative.  Difficulty weaning pressors due to drop in BP with sedation.  - Will drop milrinone to 0.25 and follow co-ox - Continue to work on weaning pressors, Increased midodrine to 15 tid to facilitate wean  - CVP 10-12 off lasix due to mild AKI. Will give one dose IV lasix today to help facilitate vent wean  3. Acute hypoxemic respiratory failure: Aspiration PNA (lower lobe opacities on CXR) + pulmonary edema. WBCs 21 => 16.9 => 14=>13 Tm 100.   - Finished zosyn today (5/29) - Vent wean per CCM. On CPAP mode now 4. Cardiac arrest: VF arrest associated with STEMI. Patient shocked x 5, 15 min CPR before ROSC.  No further VT.  Now rewarmed s/p hypothermia protocol.  He has been revascularized.  - Lifevest if recovers to assess for  recovery of EF.  5. Neuro: EEG with severe diffuse encephalopathy.  CT head showed chronic microvascular changes.  Wakes up agitated but seems to follow commands - Wean sedation with family around.  6. Anemia: Transfused 1 U on 5/27, Hgb 7.3>>8.6  but no overt bleeding noticed - no CBC today. Recheck in am 7. Paroxysmal Atrial fibrillation: We have not seen any actual atrial fibrillation, has had ST with PACs.  Currently in NSR.  - Lovenox prophy - po amiodarone.   CRITICAL CARE Performed by: Glori Bickers  Total critical care time: 35 minutes  Critical care time was exclusive of separately  billable procedures and treating other patients.  Critical care was necessary to treat or prevent imminent or life-threatening deterioration.  Critical care was time spent personally by me (independent of midlevel providers or residents) on the following activities: development of treatment plan with patient and/or surrogate as well as nursing, discussions with consultants, evaluation of patient's response to treatment, examination of patient, obtaining history from patient or surrogate, ordering and performing treatments and interventions, ordering and review of laboratory studies, ordering and review of radiographic studies, pulse oximetry and re-evaluation of patient's condition.     Glori Bickers, MD 07/29/2020 10:02 AM  Length of Stay: 8

## 2020-07-29 NOTE — Progress Notes (Signed)
Pt placed on full vent support due to no plans to extubate and pt being agitated.

## 2020-07-29 NOTE — Progress Notes (Signed)
Vincent Horton, MRN:  [id number], DOB:  1955-07-16, LOS: 8 ADMISSION DATE:  07/21/2020, CONSULTATION DATE:  07/22/2019 REFERRING MD:  [place], CHIEF COMPLAINT:  Post arrest   History of Present Illness:  Patient is a 65 year old male with limited medical history found down by family, EMS was called, he was found to be in V. fib shocked 5 x 3 mg epinephrine 450 mg of amiodarone with ROSC obtained.  He was emergently taken to the Cath Lab showing ST segment changes in anterior lateral leads status post revascularization of LAD.  He was noted to have been moving spontaneously at time of cardiac catheterization.  Transferred to the ICU.  PCCM consulted for help with management  Pertinent  Medical History    has a past medical history of Cardiac arrest [facility]), Coronary artery disease, Dysrhythmia, and Myocardial infarction (HCC).  Significant Hospital Events: Including procedures, antibiotic start and stop dates in addition to other pertinent events    5/21 Vfib arrest, Cardiac catheterization, TTM started, right IJ placed  5/22 remains on NE/ milrinone, sedated on fentanyl versed   5/23 aline placed, concern for afib controlled on IV amio, f/c this am with family  5/24 ongoing agitation, pressor requirements  5/25  Issues with sedation overnight, Remains on significant vasopressor support  5/26 Remains on significant vasopressor support, started on midodrine  5/29 remains on NEPI, Vaso, Milirinone    5/21 MRSA neg 5/22 BCx2 >> neg 5/26 trach asp >> ngtd  5/21 zosyn >> (last dose 5/27)  Interim History / Subjective:   Patient remains critically ill intubated on mechanical life support still on high-dose vasopressors with no significant response.  This has been waxing and waning for days.  Objective   Blood pressure 113/68, pulse 98, temperature 98.6 F (37 C), resp. rate (!) 24, height 5' 4.02" (1.626 m), weight 46.2 kg, SpO2 96 %. CVP:  [9 mmHg-12 mmHg] 12 mmHg  Vent Mode:  PSV;CPAP FiO2 (%):  [30 %] 30 % Set Rate:  [14 bmp] 14 bmp Vt Set:  [470 mL] 470 mL PEEP:  [5 cmH20] 5 cmH20 Pressure Support:  [5 cmH20] 5 cmH20 Plateau Pressure:  [11 cmH20-18 cmH20] 18 cmH20   Intake/Output Summary (Last 24 hours) at 07/29/2020 1032 Last data filed at 07/29/2020 0601 Gross per 24 hour  Intake 1418.15 ml  Output 2100 ml  Net -681.85 ml   Filed Weights   07/26/20 0500 07/27/20 0422 07/28/20 0329  Weight: 56.4 kg 53.3 kg 46.2 kg    Examination:   General: Thin chronically ill-appearing elderly male intubated on mechanical life support temporalis muscle wasting HEENT: Mucous membranes moist, endotracheal tube in place, left nasal core track, temporalis muscle wasting Neuro: Opens eyes to voice opens eyes with stimulation withdraws to pain CV: Regular rate rhythm, S1-S2 PULM: Bilateral mechanically ventilated breath sounds GI: Soft, nontender nondistended Extremities: Thin no edema Skin: No rash  Labs/imaging that I have personally reviewed    Chest x-ray 5/26 with stable bibasilar atelectasis/edema   [place] RUQ. Hepatic parenchymal echogenicity, borderline gallbladder thickness, no gallstones  CBC, CMET, coox, coags, no CXR  Resolved Hospital Problem list     Assessment & Plan:   Cardiac arrest resulting in development of cardiogenic shock -V. fib arrest associated with STEMI, patient shocked 5 times with 15 minutes of CPR prior to ROSC Status post STEMI post LAD stent, cardiogenic shock -Emergent cath completed 5/21 occluded proximal LAD status post DES.  Per heart failure DES embolized and again  LAD is completely occluded Cardiomyopathy EF 30-35 -Echo 5/21 with EF 30 to 35%, coarse apical trabeculation and calcified LV apical false tendon, LV with regional wall motion abnormality and grade 1 diastolic dysfunction New onset atrial fibrillation- currently NSR w/PACs P: Continue aspirin plus ticagrelor plus Crestor Milrinone plus norepinephrine Continue  vasopressin Would like to come off of vasopressin but mean arterial pressure continues to drop Continue to wean pressors to maintain MAP greater than 65 Continue diuresis Follow I's and O's Remains on midodrine Continue amiodarone Would like to hope for extubation potentially tomorrow or Tuesday.  Acute respiratory failure secondary to cardiac arrest, MI Right-sided aspiration pneumonia Pulmonary edema P:  Full vent support Adult mechanical vent protocol PAD guidelines sedation Dilaudid for sedation, controlling him better than fentanyl As needed Versed Continue Precedex  AKI -Unknown baseline, creatinine continues to downtrend P: Continue to follow urine output Continue to avoid nephrotoxic agents Follow serum creatinine   Elevated transaminases secondary to shock liver -LFT initially improved but are slowly uptrending in the setting of continued cardiogenic shock  -[place] RUQ 5/25. Hepatic parenchymal echogenicity, borderline gallbladder thickness, no gallstones P: Follow LFTs  Acute encephalopathy, metabolic -Secondary to cardiac arrest -In the setting of cardiac arrest concern for anoxic injury however CT head negative and morning of 5/23 patient is able to follow commands when spoken in native language Continuous EEG stopped 5/23, thus far showing diffuse encephalopathy, no seizures P: Off sedation follows commands. Overall improved Continue to observe supportive care  Hypokalemia P: Replete as needed  At risk malnutrition  P: Tube feeds continued  Best practice   Diet:  NPO, TF at goal  Pain/Anxiety/Delirium protocol (if indicated): Yes (RASS goal -1) VAP protocol (if indicated): Yes DVT prophylaxis: LMWH and SCD GI prophylaxis: H2B Glucose control:  SSI moderate Central venous access:  Yes, and it is still needed Arterial line:  Yes. Placed 5/23.  Not working 5/27, will remove Foley:  Yes, and it is still needed Mobility:  bed rest  PT consulted:  N/A Last date of multidisciplinary goals of care discussion: This needs to be addressed more this week with family Code Status:  full code Disposition: ICU  This patient is critically ill with multiple organ system failure; which, requires frequent high complexity decision making, assessment, support, evaluation, and titration of therapies. This was completed through the application of advanced monitoring technologies and extensive interpretation of multiple databases. During this encounter critical care time was devoted to patient care services described in this note for 32 minutes.  Josephine Igo, DO [facility] 07/29/2020 10:32 AM

## 2020-07-29 NOTE — Progress Notes (Signed)
Pt placed on PSV 5/5 per wean protocol. Pt is tolerating well at this time. RT to continue to monitor. 

## 2020-07-30 ENCOUNTER — Encounter (HOSPITAL_COMMUNITY): Payer: Self-pay | Admitting: Specialist

## 2020-07-30 DIAGNOSIS — I2102 ST elevation (STEMI) myocardial infarction involving left anterior descending coronary artery: Secondary | ICD-10-CM | POA: Diagnosis not present

## 2020-07-30 LAB — CBC
HCT: 26.4 % — ABNORMAL LOW (ref 39.0–52.0)
Hemoglobin: 8.4 g/dL — ABNORMAL LOW (ref 13.0–17.0)
MCH: 30.3 pg (ref 26.0–34.0)
MCHC: 31.8 g/dL (ref 30.0–36.0)
MCV: 95.3 fL (ref 80.0–100.0)
Platelets: 389 10*3/uL (ref 150–400)
RBC: 2.77 MIL/uL — ABNORMAL LOW (ref 4.22–5.81)
RDW: 16.3 % — ABNORMAL HIGH (ref 11.5–15.5)
WBC: 17.5 10*3/uL — ABNORMAL HIGH (ref 4.0–10.5)
nRBC: 0 % (ref 0.0–0.2)

## 2020-07-30 LAB — COMPREHENSIVE METABOLIC PANEL
ALT: 102 U/L — ABNORMAL HIGH (ref 0–44)
AST: 75 U/L — ABNORMAL HIGH (ref 15–41)
Albumin: 1.9 g/dL — ABNORMAL LOW (ref 3.5–5.0)
Alkaline Phosphatase: 100 U/L (ref 38–126)
Anion gap: 6 (ref 5–15)
BUN: 31 mg/dL — ABNORMAL HIGH (ref 8–23)
CO2: 27 mmol/L (ref 22–32)
Calcium: 8 mg/dL — ABNORMAL LOW (ref 8.9–10.3)
Chloride: 109 mmol/L (ref 98–111)
Creatinine, Ser: 1.11 mg/dL (ref 0.61–1.24)
GFR, Estimated: >60 mL/min (ref >60)
Glucose, Bld: 201 mg/dL — ABNORMAL HIGH (ref 70–99)
Potassium: 4.3 mmol/L (ref 3.5–5.1)
Sodium: 142 mmol/L (ref 135–145)
Total Bilirubin: 0.7 mg/dL (ref 0.3–1.2)
Total Protein: 5.7 g/dL — ABNORMAL LOW (ref 6.5–8.1)

## 2020-07-30 LAB — COOXEMETRY PANEL
Carboxyhemoglobin: 1.6 % — ABNORMAL HIGH (ref 0.5–1.5)
Methemoglobin: 1 % (ref 0.0–1.5)
O2 Saturation: 68.7 %
Total hemoglobin: 7.9 g/dL — ABNORMAL LOW (ref 12.0–16.0)

## 2020-07-30 LAB — GLUCOSE, CAPILLARY
Glucose-Capillary: 117 mg/dL — ABNORMAL HIGH (ref 70–99)
Glucose-Capillary: 169 mg/dL — ABNORMAL HIGH (ref 70–99)
Glucose-Capillary: 181 mg/dL — ABNORMAL HIGH (ref 70–99)
Glucose-Capillary: 188 mg/dL — ABNORMAL HIGH (ref 70–99)
Glucose-Capillary: 191 mg/dL — ABNORMAL HIGH (ref 70–99)
Glucose-Capillary: 204 mg/dL — ABNORMAL HIGH (ref 70–99)

## 2020-07-30 LAB — PROTIME-INR
INR: 1.2 (ref 0.8–1.2)
Prothrombin Time: 15.3 seconds — ABNORMAL HIGH (ref 11.4–15.2)

## 2020-07-30 MED ORDER — MILRINONE LACTATE IN DEXTROSE 20-5 MG/100ML-% IV SOLN
0.1250 ug/kg/min | INTRAVENOUS | Status: DC
  Administered 2020-07-30 – 2020-08-02 (×3): 0.125 ug/kg/min via INTRAVENOUS
  Filled 2020-07-30: qty 100

## 2020-07-30 MED ORDER — FUROSEMIDE 10 MG/ML IJ SOLN
40.0000 mg | Freq: Every day | INTRAMUSCULAR | Status: DC
  Administered 2020-07-30 – 2020-07-31 (×2): 40 mg via INTRAVENOUS
  Filled 2020-07-30 (×2): qty 4

## 2020-07-30 NOTE — Plan of Care (Signed)
  Problem: Education: Goal: Knowledge of General Education information will improve Description: Including pain rating scale, medication(s)/side effects and non-pharmacologic comfort measures Outcome: Progressing   Problem: Health Behavior/Discharge Planning: Goal: Ability to manage health-related needs will improve Outcome: Progressing   Problem: Clinical Measurements: Goal: Ability to maintain clinical measurements within normal limits will improve Outcome: Progressing Goal: Will remain free from infection Outcome: Progressing Goal: Diagnostic test results will improve Outcome: Progressing Goal: Respiratory complications will improve Outcome: Progressing Goal: Cardiovascular complication will be avoided Outcome: Progressing   Problem: Activity: Goal: Risk for activity intolerance will decrease Outcome: Progressing   Problem: Nutrition: Goal: Adequate nutrition will be maintained Outcome: Progressing   Problem: Coping: Goal: Level of anxiety will decrease Outcome: Progressing   Problem: Elimination: Goal: Will not experience complications related to bowel motility Outcome: Progressing Goal: Will not experience complications related to urinary retention Outcome: Progressing   Problem: Pain Managment: Goal: General experience of comfort will improve Outcome: Progressing   Problem: Safety: Goal: Ability to remain free from injury will improve Outcome: Progressing   Problem: Skin Integrity: Goal: Risk for impaired skin integrity will decrease Outcome: Progressing   Problem: Respiratory: Goal: Ability to maintain a clear airway and adequate ventilation will improve Outcome: Progressing   Problem: Role Relationship: Goal: Method of communication will improve Outcome: Progressing   Problem: Education: Goal: Understanding of cardiac disease, CV risk reduction, and recovery process will improve Outcome: Progressing Goal: Understanding of medication regimen will  improve Outcome: Progressing Goal: Individualized Educational Video(s) Outcome: Progressing   Problem: Activity: Goal: Ability to tolerate increased activity will improve Outcome: Progressing   Problem: Cardiac: Goal: Ability to achieve and maintain adequate cardiopulmonary perfusion will improve Outcome: Progressing Goal: Vascular access site(s) Level 0-1 will be maintained Outcome: Progressing   Problem: Health Behavior/Discharge Planning: Goal: Ability to safely manage health-related needs after discharge will improve Outcome: Progressing   Problem: Safety: Goal: Non-violent Restraint(s) Outcome: Progressing

## 2020-07-30 NOTE — Progress Notes (Signed)
NAMEJencarlos Nicolson, MRN:  [id number], DOB:  [birth year], LOS: 9 ADMISSION DATE:  07/21/2020, CONSULTATION DATE:  07/22/2019 REFERRING MD:  [place], CHIEF COMPLAINT:  Post arrest   History of Present Illness:  Patient is a 65 year old male with limited medical history found down by family, EMS was called, he was found to be in V. fib shocked 5 x 3 mg epinephrine 450 mg of amiodarone with ROSC obtained.  He was emergently taken to the Cath Lab showing ST segment changes in anterior lateral leads status post revascularization of LAD.  He was noted to have been moving spontaneously at time of cardiac catheterization.  Transferred to the ICU.  PCCM consulted for help with management  Pertinent  Medical History    has a past medical history of Cardiac arrest [facility]), Coronary artery disease, Dysrhythmia, and Myocardial infarction (HCC).  Significant Hospital Events: Including procedures, antibiotic start and stop dates in addition to other pertinent events    5/21 Vfib arrest, Cardiac catheterization, TTM started, right IJ placed  5/22 remains on NE/ milrinone, sedated on fentanyl versed   5/23 aline placed, concern for afib controlled on IV amio, f/c this am with family  5/24 ongoing agitation, pressor requirements  5/25  Issues with sedation overnight, Remains on significant vasopressor support  5/26 Remains on significant vasopressor support, started on midodrine  5/29 remains on NEPI, Vaso, Milirinone    5/21 MRSA neg 5/22 BCx2 >> neg 5/26 trach asp >> ngtd  5/21 zosyn >> (last dose 5/27)  Interim History / Subjective:   Remains on vasopressors and intubated. Some reports that he followed commands, slowly weaning sedation.  Objective   Blood pressure 96/69, pulse 72, temperature 98.6 F (37 C), resp. rate 15, height 5\' 4"  (1.626 m), weight 53.6 kg, SpO2 100 %. CVP:  [10 mmHg-13 mmHg] 11 mmHg  Vent Mode: PRVC FiO2 (%):  [30 %] 30 % Set Rate:  [14 bmp-15 bmp] 15 bmp Vt Set:  [470  mL] 470 mL PEEP:  [5 cmH20] 5 cmH20 Pressure Support:  [5 cmH20] 5 cmH20 Plateau Pressure:  [14 cmH20-20 cmH20] 20 cmH20   Intake/Output Summary (Last 24 hours) at 07/30/2020 1059 Last data filed at 07/30/2020 1000 Gross per 24 hour  Intake 4432.25 ml  Output 4770 ml  Net -337.75 ml   Filed Weights   07/27/20 0422 07/28/20 0329 07/30/20 0415  Weight: 53.3 kg 46.2 kg 53.6 kg    Examination:   Constitutional: cachetic man on vent  Eyes: pupils equal, corneals intact Ears, nose, mouth, and throat: ETT in place, minimal secretions Cardiovascular: RRR, ext warm Respiratory: crackles, passive on vent Gastrointestinal: soft, rectal tube in place Skin: No rashes, normal turgor Neurologic: opens eyes to pain, cannot get to follow commands Psychiatric: cannot assess   Labs/imaging that I have personally reviewed   CXR 5/26 with pulmonary edema  Resolved Hospital Problem list     Assessment & Plan:   Cardiac arrest resulting in development of cardiogenic shock -V. fib arrest associated with STEMI, patient shocked 5 times with 15 minutes of CPR prior to ROSC Status post STEMI post LAD stent, cardiogenic shock -Emergent cath completed 5/21 occluded proximal LAD status post DES.  Per heart failure DES embolized and again LAD is completely occluded Cardiomyopathy EF 30-35 -Echo 5/21 with EF 30 to 35%, coarse apical trabeculation and calcified LV apical false tendon, LV with regional wall motion abnormality and grade 1 diastolic dysfunction New onset atrial fibrillation- currently NSR w/PACs Acute  respiratory failure secondary to cardiac arrest, MI Right-sided aspiration pneumonia- treated with zosyn Pulmonary edema Acute encephalopathy, metabolic vs. Anoxic- EEG and initial head CT neg; apparently was following commands in native language on 5/23 Protein calorie malnutrition- present on admission - Continue DAPT - Continue amiodarone - Wean IV sedating drips targeting RASS 0 -  Inotropes and diuresis per CHF team - Hopefully can give trial off vent in next day or two so we get idea of where mental status stands   Best practice   Diet:  NPO, TF at goal  Pain/Anxiety/Delirium protocol (if indicated): Yes (RASS goal -1) VAP protocol (if indicated): Yes DVT prophylaxis: LMWH and SCD GI prophylaxis: H2B Glucose control:  SSI moderate Central venous access:  Yes, and it is still needed Arterial line:  off Foley:  Yes, and it is still needed Mobility:  bed rest  PT consulted: N/A Last date of multidisciplinary goals of care discussion: called daughter, no answer Code Status:  full code Disposition: ICU   Patient critically ill due to respiratory failure Interventions to address this today sedation and ventilator waening Risk of deterioration without these interventions is high  I personally spent 34 minutes providing critical care not including any separately billable procedures  Myrla Halsted MD [facility]  Prefer epic messenger for cross cover needs If after hours, please call E-link

## 2020-07-30 NOTE — Progress Notes (Signed)
Patient ID: Vincent Horton, male   DOB: 09/09/1955, 65 y.o.   MRN: [id number] P    Advanced Heart Failure Rounding Note  PCP-Cardiologist: None   Subjective:    Continues to fail vent wean due to severe agitation. Now fully sedated on vent.   Remains on  NE 14, milrinone 0.25 VP 0.03  Co-ox 69%   Echo: EF 30-35%, no LV thrombus, LAD territory WMAs, RV normal, IVC normal.    Objective:   Weight Range: 53.6 kg Body mass index is 20.27 kg/m.   Vital Signs:   Temp:  [97.7 F (36.5 C)-101.66 F (38.7 C)] 99.5 F (37.5 C) (05/30 0900) Pulse Rate:  [68-136] 79 (05/30 0900) Resp:  [11-36] 12 (05/30 0900) BP: (92-161)/(61-93) 113/69 (05/30 0900) SpO2:  [95 %-100 %] 100 % (05/30 0900) FiO2 (%):  [30 %] 30 % (05/30 0834) Weight:  [53.6 kg] 53.6 kg (05/30 0415) Last BM Date: 07/30/20  Weight change: Filed Weights   07/27/20 0422 07/28/20 0329 07/30/20 0415  Weight: 53.3 kg 46.2 kg 53.6 kg    Intake/Output:   Intake/Output Summary (Last 24 hours) at 07/30/2020 5284 Last data filed at 07/30/2020 0900 Gross per 24 hour  Intake 4323.18 ml  Output 5010 ml  Net -686.82 ml      Physical Exam    General:  Thin ill appearing male on vent  Sedated on vent HEENT: normal + ETT and cor-trak Cor: PMI nondisplaced. Regular rate & rhythm. No rubs, gallops or murmurs. Lungs: clear Abdomen: soft, nontender, nondistended. No hepatosplenomegaly. No bruits or masses. Good bowel sounds. Extremities: no cyanosis, clubbing, rash, edema + boots Neuro: as above   Telemetry   NSR 70s Personally reviewed  Labs    CBC Recent Labs    07/28/20 0319 07/30/20 0413  WBC 14.5* 17.5*  HGB 8.5* 8.4*  HCT 25.9* 26.4*  MCV 93.5 95.3  PLT 282 132   Basic Metabolic Panel Recent Labs    07/29/20 0510 07/30/20 0413  NA 139 142  K 3.7 4.3  CL 110 109  CO2 25 27  GLUCOSE 195* 201*  BUN 32* 31*  CREATININE 1.21 1.11  CALCIUM 7.9* 8.0*   Liver Function Tests Recent Labs    07/29/20 0510  07/30/20 0413  AST 116* 75*  ALT 121* 102*  ALKPHOS 101 100  BILITOT 0.8 0.7  PROT 5.5* 5.7*  ALBUMIN 2.0* 1.9*   No results for input(s): LIPASE, AMYLASE in the last 72 hours. Cardiac Enzymes No results for input(s): CKTOTAL, CKMB, CKMBINDEX, TROPONINI in the last 72 hours.  BNP: BNP (last 3 results) No results for input(s): BNP in the last 8760 hours.  ProBNP (last 3 results) No results for input(s): PROBNP in the last 8760 hours.   D-Dimer No results for input(s): DDIMER in the last 72 hours. Hemoglobin A1C No results for input(s): HGBA1C in the last 72 hours. Fasting Lipid Panel No results for input(s): CHOL, HDL, LDLCALC, TRIG, CHOLHDL, LDLDIRECT in the last 72 hours. Thyroid Function Tests No results for input(s): TSH, T4TOTAL, T3FREE, THYROIDAB in the last 72 hours.  Invalid input(s): FREET3  Other results:   Imaging    No results found.   Medications:     Scheduled Medications: . amiodarone  200 mg Per Tube BID  . aspirin  81 mg Per Tube Daily  . chlorhexidine gluconate (MEDLINE KIT)  15 mL Mouth Rinse BID  . Chlorhexidine Gluconate Cloth  6 each Topical Daily  . clonazepam  0.5  mg Per Tube TID  . docusate  100 mg Per Tube BID  . enoxaparin (LOVENOX) injection  30 mg Subcutaneous Daily  .  HYDROmorphone (DILAUDID) injection  0.5 mg Intravenous Once  . insulin aspart  0-15 Units Subcutaneous Q4H  . mouth rinse  15 mL Mouth Rinse 10 times per day  . midodrine  15 mg Per Tube TID WC  . oxyCODONE  10 mg Per Tube Q8H  . polyethylene glycol  17 g Per Tube Daily  . rosuvastatin  40 mg Per Tube Daily  . sodium chloride flush  10-40 mL Intracatheter Q12H  . sodium chloride flush  3 mL Intravenous Q12H  . ticagrelor  90 mg Per Tube BID    Infusions: . sodium chloride 10 mL/hr at 07/30/20 0900  . sodium chloride    . dexmedetomidine (PRECEDEX) IV infusion 1.5 mcg/kg/hr (07/30/20 0900)  . famotidine (PEPCID) IV Stopped (07/30/20 0839)  . feeding  supplement (VITAL AF 1.2 CAL) 55 mL/hr at 07/30/20 0400  . HYDROmorphone 1.75 mg/hr (07/30/20 0900)  . milrinone 0.25 mcg/kg/min (07/30/20 0900)  . norepinephrine (LEVOPHED) Adult infusion 14 mcg/min (07/30/20 0900)  . vasopressin 0.03 Units/min (07/30/20 0900)    PRN Medications: Place/Maintain arterial line **AND** sodium chloride, acetaminophen (TYLENOL) oral liquid 160 mg/5 mL, HYDROmorphone, midazolam, ondansetron (ZOFRAN) IV, sodium chloride flush, sodium chloride flush    Assessment/Plan   1. CAD: Anterior STEMI with occluded proximal LAD.  Treated with DES but complicated by embolization to distal LAD (procedure completed with occluded distal LAD).  No s/s angina - Continue ticagrelor and ASA 81.  - Crestor 40 daily. 2. Cardiogenic shock: Ischemic cardiomyopathy. Echo with EF 30-35%, no LV thrombus, LAD territory WMAs, RV normal, IVC normal. Currently on NE 11, milrinone 0.25, vasopressin 0.03.  Co-ox 69%.  CVP 8-9 with I/Os negative.  Difficulty weaning pressors due to drop in BP with sedation.  - Will drop milrinone to 0.125 to help keep BP up and follow co-ox,  - Continue to work on weaning pressors, Continue midodrine 15 tid to help - CVP 8-9 off lasix due to mild AKI. Will give daily lasix 40 IV for now.A djust as needed 3. Acute hypoxemic respiratory failure: Aspiration PNA (lower lobe opacities on CXR) + pulmonary edema. WBCs 21 => 16.9 => 14=>13 =. 17.5k  Tm 101.6 - Finished zosyn 5/29 - Vent wean per CCM. Suspect he may be headed toward trach  4. Cardiac arrest: VF arrest associated with STEMI. Patient shocked x 5, 15 min CPR before ROSC.  No further VT.  Now rewarmed s/p hypothermia protocol.  He has been revascularized.  - Consider Lifevest if/when recovers to assess for recovery of EF.  5. Neuro: EEG with severe diffuse encephalopathy.  CT head showed chronic microvascular changes.  Wakes up agitated but seems to follow commands - CCM managing sedation  6. Anemia:  Transfused 1 U on 5/27, Hgb 7.3>>8.6  but no overt bleeding noticed - no CBC today. Recheck in am 7. Paroxysmal Atrial fibrillation: We have not seen any actual atrial fibrillation, has had ST with PACs.  Currently in NSR.  - Lovenox prophylaxis - po amiodarone.   CRITICAL CARE Performed by: Glori Bickers  Total critical care time: 45 minutes  Critical care time was exclusive of separately billable procedures and treating other patients.  Critical care was necessary to treat or prevent imminent or life-threatening deterioration.  Critical care was time spent personally by me (independent of midlevel providers or residents) on  the following activities: development of treatment plan with patient and/or surrogate as well as nursing, discussions with consultants, evaluation of patient's response to treatment, examination of patient, obtaining history from patient or surrogate, ordering and performing treatments and interventions, ordering and review of laboratory studies, ordering and review of radiographic studies, pulse oximetry and re-evaluation of patient's condition.     Glori Bickers, MD 07/30/2020 9:23 AM  Length of Stay: 9

## 2020-07-31 ENCOUNTER — Inpatient Hospital Stay (HOSPITAL_COMMUNITY): Payer: Medicare Other

## 2020-07-31 DIAGNOSIS — I469 Cardiac arrest, cause unspecified: Secondary | ICD-10-CM | POA: Diagnosis not present

## 2020-07-31 DIAGNOSIS — I2102 ST elevation (STEMI) myocardial infarction involving left anterior descending coronary artery: Secondary | ICD-10-CM | POA: Diagnosis not present

## 2020-07-31 DIAGNOSIS — J9601 Acute respiratory failure with hypoxia: Secondary | ICD-10-CM | POA: Diagnosis not present

## 2020-07-31 DIAGNOSIS — R570 Cardiogenic shock: Secondary | ICD-10-CM | POA: Diagnosis not present

## 2020-07-31 LAB — COMPREHENSIVE METABOLIC PANEL
ALT: 82 U/L — ABNORMAL HIGH (ref 0–44)
AST: 55 U/L — ABNORMAL HIGH (ref 15–41)
Albumin: 1.9 g/dL — ABNORMAL LOW (ref 3.5–5.0)
Alkaline Phosphatase: 97 U/L (ref 38–126)
Anion gap: 6 (ref 5–15)
BUN: 30 mg/dL — ABNORMAL HIGH (ref 8–23)
CO2: 29 mmol/L (ref 22–32)
Calcium: 8.1 mg/dL — ABNORMAL LOW (ref 8.9–10.3)
Chloride: 109 mmol/L (ref 98–111)
Creatinine, Ser: 1.09 mg/dL (ref 0.61–1.24)
GFR, Estimated: >60 mL/min (ref >60)
Glucose, Bld: 167 mg/dL — ABNORMAL HIGH (ref 70–99)
Potassium: 4.1 mmol/L (ref 3.5–5.1)
Sodium: 144 mmol/L (ref 135–145)
Total Bilirubin: 0.6 mg/dL (ref 0.3–1.2)
Total Protein: 5.8 g/dL — ABNORMAL LOW (ref 6.5–8.1)

## 2020-07-31 LAB — PROTIME-INR
INR: 1.1 (ref 0.8–1.2)
Prothrombin Time: 14.2 seconds (ref 11.4–15.2)

## 2020-07-31 LAB — PROCALCITONIN: Procalcitonin: 0.92 ng/mL

## 2020-07-31 LAB — GLUCOSE, CAPILLARY
Glucose-Capillary: 179 mg/dL — ABNORMAL HIGH (ref 70–99)
Glucose-Capillary: 175 mg/dL — ABNORMAL HIGH (ref 70–99)
Glucose-Capillary: 190 mg/dL — ABNORMAL HIGH (ref 70–99)
Glucose-Capillary: 262 mg/dL — ABNORMAL HIGH (ref 70–99)
Glucose-Capillary: 173 mg/dL — ABNORMAL HIGH (ref 70–99)

## 2020-07-31 LAB — COOXEMETRY PANEL
Carboxyhemoglobin: 1.5 % (ref 0.5–1.5)
Methemoglobin: 0.9 % (ref 0.0–1.5)
O2 Saturation: 67.7 %
Total hemoglobin: 11.7 g/dL — ABNORMAL LOW (ref 12.0–16.0)

## 2020-07-31 MED ORDER — ORAL CARE MOUTH RINSE
15.0000 mL | Freq: Two times a day (BID) | OROMUCOSAL | Status: DC
  Administered 2020-07-31 (×2): 15 mL via OROMUCOSAL

## 2020-07-31 MED ORDER — SODIUM CHLORIDE 0.9 % IV SOLN
2.0000 g | Freq: Two times a day (BID) | INTRAVENOUS | Status: DC
  Administered 2020-07-31 – 2020-08-02 (×6): 2 g via INTRAVENOUS
  Filled 2020-07-31 (×11): qty 2

## 2020-07-31 MED ORDER — IPRATROPIUM-ALBUTEROL 0.5-2.5 (3) MG/3ML IN SOLN
3.0000 mL | Freq: Four times a day (QID) | RESPIRATORY_TRACT | Status: DC
  Administered 2020-07-31 – 2020-08-02 (×9): 3 mL via RESPIRATORY_TRACT
  Filled 2020-07-31 (×9): qty 3

## 2020-07-31 MED ORDER — DEXAMETHASONE SODIUM PHOSPHATE 4 MG/ML IJ SOLN
4.0000 mg | Freq: Two times a day (BID) | INTRAMUSCULAR | Status: AC
  Administered 2020-07-31 – 2020-08-01 (×4): 4 mg via INTRAVENOUS
  Filled 2020-07-31 (×4): qty 1

## 2020-07-31 MED ORDER — POTASSIUM CHLORIDE 20 MEQ PO PACK
20.0000 meq | PACK | Freq: Once | ORAL | Status: AC
  Administered 2020-07-31: 20 meq
  Filled 2020-07-31: qty 1

## 2020-07-31 MED ORDER — VANCOMYCIN HCL 1000 MG/200ML IV SOLN
1000.0000 mg | INTRAVENOUS | Status: DC
  Administered 2020-07-31 – 2020-08-03 (×4): 1000 mg via INTRAVENOUS
  Filled 2020-07-31 (×4): qty 200

## 2020-07-31 MED ORDER — CHLORHEXIDINE GLUCONATE 0.12 % MT SOLN
15.0000 mL | Freq: Two times a day (BID) | OROMUCOSAL | Status: DC
  Administered 2020-07-31 – 2020-08-10 (×18): 15 mL via OROMUCOSAL
  Filled 2020-07-31 (×16): qty 15

## 2020-07-31 NOTE — Progress Notes (Addendum)
Patient ID: Vincent Horton, male   DOB: [birth year], 65 y.o.   MRN: [id number] P    Advanced Heart Failure Rounding Note  PCP-Cardiologist: None   Subjective:    Remains on vent, difficulties weaning over the weekend due to severe agitation. Currently awake on vent. Not agitated.   Remains on  NE 5, milrinone 0.125, VP 0.01.  Co-ox 68%. CVP 8-9. MAPs 70s   Remains febrile, mTemp overnight 101.3. Off Abx since 5/27   Echo: EF 30-35%, no LV thrombus, LAD territory WMAs, RV normal, IVC normal.    Objective:   Weight Range: 50.8 kg Body mass index is 19.22 kg/m.   Vital Signs:   Temp:  [98.24 F (36.8 C)-101.3 F (38.5 C)] 101.3 F (38.5 C) (05/31 0700) Pulse Rate:  [70-132] 130 (05/31 0700) Resp:  [11-28] 22 (05/31 0700) BP: (90-148)/(59-97) 148/85 (05/31 0700) SpO2:  [93 %-100 %] 99 % (05/31 0700) FiO2 (%):  [30 %] 30 % (05/31 0441) Weight:  [50.8 kg] 50.8 kg (05/31 0210) Last BM Date: 07/30/20  Weight change: Filed Weights   07/28/20 0329 07/30/20 0415 07/31/20 0210  Weight: 46.2 kg 53.6 kg 50.8 kg    Intake/Output:   Intake/Output Summary (Last 24 hours) at 07/31/2020 0109 Last data filed at 07/31/2020 0700 Gross per 24 hour  Intake 2600.76 ml  Output 4185 ml  Net -1584.24 ml      Physical Exam    CVP 8-9  General:  Thin and ill appearing. Wake on vent  HEENT: normal Neck: supple. + ETT JVD 8 cm. Carotids 2+ bilat; no bruits. No lymphadenopathy or thyromegaly appreciated. Cor: PMI nondisplaced. Regular rhythm tachy rate. No rubs, gallops or murmurs. Lungs: intubated and clear  Abdomen: soft, nontender, nondistended. No hepatosplenomegaly. No bruits or masses. Good bowel sounds. Extremities: no cyanosis, clubbing, rash, thin extremities, no edema Neuro: intubated, awake on vent   Telemetry   Sinus tach 110s-120s Personally reviewed  Labs    CBC Recent Labs    07/30/20 0413  WBC 17.5*  HGB 8.4*  HCT 26.4*  MCV 95.3  PLT 323   Basic Metabolic  Panel Recent Labs    07/30/20 0413 07/31/20 0334  NA 142 144  K 4.3 4.1  CL 109 109  CO2 27 29  GLUCOSE 201* 167*  BUN 31* 30*  CREATININE 1.11 1.09  CALCIUM 8.0* 8.1*   Liver Function Tests Recent Labs    07/30/20 0413 07/31/20 0334  AST 75* 55*  ALT 102* 82*  ALKPHOS 100 97  BILITOT 0.7 0.6  PROT 5.7* 5.8*  ALBUMIN 1.9* 1.9*   No results for input(s): LIPASE, AMYLASE in the last 72 hours. Cardiac Enzymes No results for input(s): CKTOTAL, CKMB, CKMBINDEX, TROPONINI in the last 72 hours.  BNP: BNP (last 3 results) No results for input(s): BNP in the last 8760 hours.  ProBNP (last 3 results) No results for input(s): PROBNP in the last 8760 hours.   D-Dimer No results for input(s): DDIMER in the last 72 hours. Hemoglobin A1C No results for input(s): HGBA1C in the last 72 hours. Fasting Lipid Panel No results for input(s): CHOL, HDL, LDLCALC, TRIG, CHOLHDL, LDLDIRECT in the last 72 hours. Thyroid Function Tests No results for input(s): TSH, T4TOTAL, T3FREE, THYROIDAB in the last 72 hours.  Invalid input(s): FREET3  Other results:   Imaging    DG Chest Port 1 View  Result Date: 07/31/2020 CLINICAL DATA:  Intubation. EXAM: PORTABLE CHEST 1 VIEW COMPARISON:  07/26/2020. FINDINGS: Endotracheal  tube, feeding tube, right IJ catheter in stable position. Heart size stable. Bibasilar pulmonary infiltrates/edema again noted. Slight improvement on the left. Persistent bibasilar atelectasis. Persistent mild to moderate right pleural effusion. No pneumothorax. IMPRESSION: 1.  Lines and tubes in stable position. 2. Persistent bibasilar infiltrates/edema again noted. Slight improvement on the left. Persistent bibasilar atelectasis. Persistent mild to moderate right pleural effusion. Electronically Signed   By: Marcello Moores  Register   On: 07/31/2020 05:18     Medications:     Scheduled Medications: . amiodarone  200 mg Per Tube BID  . aspirin  81 mg Per Tube Daily  .  chlorhexidine gluconate (MEDLINE KIT)  15 mL Mouth Rinse BID  . Chlorhexidine Gluconate Cloth  6 each Topical Daily  . clonazepam  0.5 mg Per Tube TID  . docusate  100 mg Per Tube BID  . enoxaparin (LOVENOX) injection  30 mg Subcutaneous Daily  . furosemide  40 mg Intravenous Daily  . insulin aspart  0-15 Units Subcutaneous Q4H  . mouth rinse  15 mL Mouth Rinse 10 times per day  . midodrine  15 mg Per Tube TID WC  . oxyCODONE  10 mg Per Tube Q8H  . polyethylene glycol  17 g Per Tube Daily  . rosuvastatin  40 mg Per Tube Daily  . sodium chloride flush  10-40 mL Intracatheter Q12H  . sodium chloride flush  3 mL Intravenous Q12H  . ticagrelor  90 mg Per Tube BID    Infusions: . sodium chloride Stopped (07/30/20 2347)  . sodium chloride    . dexmedetomidine (PRECEDEX) IV infusion 1 mcg/kg/hr (07/31/20 0700)  . famotidine (PEPCID) IV Stopped (07/30/20 2255)  . feeding supplement (VITAL AF 1.2 CAL) 55 mL/hr at 07/31/20 0700  . HYDROmorphone 1.25 mg/hr (07/31/20 0700)  . milrinone 0.125 mcg/kg/min (07/31/20 0700)  . norepinephrine (LEVOPHED) Adult infusion 5 mcg/min (07/31/20 0700)  . vasopressin 0.01 Units/min (07/31/20 0700)    PRN Medications: Place/Maintain arterial line **AND** sodium chloride, acetaminophen (TYLENOL) oral liquid 160 mg/5 mL, HYDROmorphone, midazolam, ondansetron (ZOFRAN) IV, sodium chloride flush, sodium chloride flush    Assessment/Plan   1. CAD: Anterior STEMI with occluded proximal LAD.  Treated with DES but complicated by embolization to distal LAD (procedure completed with occluded distal LAD).  No s/s angina - Continue ticagrelor and ASA 81.  - Crestor 40 daily. 2. Cardiogenic shock: Ischemic cardiomyopathy. Echo with EF 30-35%, no LV thrombus, LAD territory WMAs, RV normal, IVC normal. Currently on NE 5, milrinone 0.125, vasopressin 0.01.  Co-ox 68%.  CVP 8-9 with I/Os negative.  Difficulty weaning pressors due to drop in BP with sedation.  - Continue to  work on weaning pressors as tolerated, Continue midodrine 15 tid to help - CVP 8-9. Continue IV Lasix 40 daily  3. Acute hypoxemic respiratory failure: Aspiration PNA (lower lobe opacities on CXR) + pulmonary edema. WBCs 21 => 16.9 => 14=>13 => 17.5,  Tm 101.3 - Finished zosyn 5/29 - Vent wean per CCM. Suspect he may be headed toward trach  - consider pan culture given persistent fevers ? Restarting abx, defer to CCM  4. Cardiac arrest: VF arrest associated with STEMI. Patient shocked x 5, 15 min CPR before ROSC.  No further VT.  Now rewarmed s/p hypothermia protocol.  He has been revascularized.  - Consider Lifevest if/when recovers to assess for recovery of EF.  5. Neuro: EEG with severe diffuse encephalopathy.  CT head showed chronic microvascular changes.  Wakes up agitated but  seems to follow commands - CCM managing sedation  6. Anemia: Transfused 1 U on 5/27, Hgb 7.3>>8.6>>8.4  but no overt bleeding noticed 7. Paroxysmal Atrial fibrillation: We have not seen any actual atrial fibrillation, has had ST with PACs.  Currently in ST 110s-120s.  - Lovenox prophylaxis - po amiodarone.   Lyda Jester, PA-C  07/31/2020 7:12 AM  Length of Stay: 10  Patient seen with PA, agree with the above note.   Currently on milrinone 0.125, NE 5, vasopressin 0.01.  CVP 8-9.  I/Os negative on Lasix 40 mg IV daily.  Co-ox 68%.    General: Awake on vent Neck: No JVD, no thyromegaly or thyroid nodule.  Lungs: Clear to auscultation bilaterally with normal respiratory effort. CV: Nondisplaced PMI.  Heart regular S1/S2, no S3/S4, no murmur.  No peripheral edema.  No carotid bruit.  Normal pedal pulses.  Abdomen: Soft, nontender, no hepatosplenomegaly, no distention.  Skin: Intact without lesions or rashes.  Neurologic: Follows commands intermittently but there is language barrier Extremities: No clubbing or cyanosis.  HEENT: Normal.   Slowly weaning down pressors.  Hopefully can stop vasopressin  today.   Continue Lasix 40 mg IV daily.   Persistent fevers, off abx since 5/27.  D/w CCM, will treat for HCAP.   Will wean sedation and plan SBT per CCM.  Will try to coordinate any extubation attempt with family presence due to language barrier.   CRITICAL CARE Performed by: Loralie Champagne   Total critical care time: 35 minutes  Critical care time was exclusive of separately billable procedures and treating other patients.  Critical care was necessary to treat or prevent imminent or life-threatening deterioration.  Critical care was time spent personally by me on the following activities: development of treatment plan with patient and/or surrogate as well as nursing, discussions with consultants, evaluation of patient's response to treatment, examination of patient, obtaining history from patient or surrogate, ordering and performing treatments and interventions, ordering and review of laboratory studies, ordering and review of radiographic studies, pulse oximetry and re-evaluation of patient's condition.  Loralie Champagne 07/31/2020 7:56 AM

## 2020-07-31 NOTE — Progress Notes (Signed)
Dilaudid wasted in stericycle 07/31/20 1950  20 mL  Harolyn Rutherford, RN Kinnie Feil, [place]

## 2020-07-31 NOTE — Progress Notes (Signed)
Pharmacy Antibiotic Note  Vincent Horton is a 65 y.o. male admitted on 07/21/2020 with cardiogenic shock and respiratory failure. Pharmacy has been consulted for vancomycin and ceftaizimd dosing for possible PNA. Pt previously received Zosyn x1 week for aspiration. Cr remains stable ~1 mg/dl.  Plan: -Ceftazidime 2g IV q12h -Vancomycin 1000mg  IV q24h - est AUC 496 -Follow Cr, cultures, LOT -Vancomycin levels as needed   Height: 5\' 4"  (162.6 cm) Weight: 50.8 kg (111 lb 15.9 oz) IBW/kg (Calculated) : 59.2  Temp (24hrs), Avg:99.9 F (37.7 C), Min:98.24 F (36.8 C), Max:101.48 F (38.6 C)  Recent Labs  Lab 07/26/20 0442 07/26/20 1810 07/27/20 0408 07/28/20 0319 07/29/20 0510 07/30/20 0413 07/31/20 0334  WBC 14.4* 15.0* 13.7* 14.5*  --  17.5*  --   CREATININE 1.27*  --  1.28* 1.42* 1.21 1.11 1.09    Estimated Creatinine Clearance: 47.9 mL/min (by C-G formula based on SCr of 1.09 mg/dL).    No Known Allergies  Antimicrobials this admission: Zosyn 5/21 >> 5/29 Vancomycin 5/31 >>  Ceftazidime 5/31 >>  Microbiology results: 5/22 BCx: NGTD 5/26 TA: rare candida  Thank you for allowing pharmacy to be a part of this patient's care.  6/22, PharmD, BCPS, [facility] Clinical Pharmacist [phone] Please check AMION for all [facility] Pharmacy numbers 07/31/2020

## 2020-07-31 NOTE — Progress Notes (Signed)
NAMEJoseguadalupe Horton, MRN:  [id number], DOB:  07/23/1955, LOS: 10 ADMISSION DATE:  07/21/2020, CONSULTATION DATE:  07/22/2019 REFERRING MD:  [place], CHIEF COMPLAINT:  Post arrest   History of Present Illness:  Patient is a 65 year old male with limited medical history found down by family, EMS was called, he was found to be in V. fib shocked 5 x 3 mg epinephrine 450 mg of amiodarone with ROSC obtained.  He was emergently taken to the Cath Lab showing ST segment changes in anterior lateral leads status post revascularization of LAD.  He was noted to have been moving spontaneously at time of cardiac catheterization.  Transferred to the ICU.  PCCM consulted for help with management  Pertinent  Medical History    has a past medical history of Cardiac arrest [facility]), Coronary artery disease, Dysrhythmia, and Myocardial infarction (HCC).  Significant Hospital Events: Including procedures, antibiotic start and stop dates in addition to other pertinent events    5/21 Vfib arrest, Cardiac catheterization, TTM started, right IJ placed  5/22 remains on NE/ milrinone, sedated on fentanyl versed   5/23 aline placed, concern for afib controlled on IV amio, f/c this am with family  5/24 ongoing agitation, pressor requirements  5/25  Issues with sedation overnight, Remains on significant vasopressor support  5/26 Remains on significant vasopressor support, started on midodrine  5/29 remains on NEPI, Vaso, Milirinone    5/21 MRSA neg 5/22 BCx2 >> neg 5/26 trach asp >> ngtd  5/21 zosyn >> (last dose 5/27)  Interim History / Subjective:  No events, slowly coming off sedation. Low grade fevers.  Objective   Blood pressure 107/70, pulse (!) 113, temperature (!) 101.48 F (38.6 C), resp. rate 18, height 5\' 4"  (1.626 m), weight 50.8 kg, SpO2 100 %. CVP:  [2 mmHg-23 mmHg] 9 mmHg  Vent Mode: PRVC FiO2 (%):  [30 %] 30 % Set Rate:  [15 bmp] 15 bmp Vt Set:  [470 mL] 470 mL PEEP:  [5 cmH20] 5  cmH20 Pressure Support:  [5 cmH20] 5 cmH20 Plateau Pressure:  [13 cmH20-20 cmH20] 14 cmH20   Intake/Output Summary (Last 24 hours) at 07/31/2020 0801 Last data filed at 07/31/2020 0700 Gross per 24 hour  Intake 2486.42 ml  Output 4185 ml  Net -1698.58 ml   Filed Weights   07/28/20 0329 07/30/20 0415 07/31/20 0210  Weight: 46.2 kg 53.6 kg 50.8 kg    Examination:   Constitutional: cachetic man on vent  Eyes: pupils equal, not tracking Ears, nose, mouth, and throat: ETT in place, small thick secretions Cardiovascular: tachycardic, +SEM, ext warm Respiratory: Diminished R base, small-moderate simple appearing effusion on 08/02/20 Gastrointestinal: soft, +BS Skin: No rashes, normal turgor Neurologic: moving all 4 ext, nodding head, trying to get interpreter to help Psychiatric: RASS 0    Labs/imaging that I have personally reviewed   CXR 5/31 continued R pleural effusion, L lung more clear Diuresing well LFTs/Cr improved  Resolved Hospital Problem list     Assessment & Plan:   Cardiac arrest resulting in development of cardiogenic shock -V. fib arrest associated with STEMI, patient shocked 5 times with 15 minutes of CPR prior to ROSC Status post STEMI post LAD stent, cardiogenic shock -Emergent cath completed 5/21 occluded proximal LAD status post DES.  Per heart failure DES embolized and again LAD is completely occluded Cardiomyopathy EF 30-35 -Echo 5/21 with EF 30 to 35%, coarse apical trabeculation and calcified LV apical false tendon, LV with regional wall motion abnormality and  grade 1 diastolic dysfunction New onset atrial fibrillation- currently NSR w/PACs Acute respiratory failure secondary to cardiac arrest, MI Right-sided aspiration pneumonia- treated with zosyn Pulmonary edema Acute encephalopathy, metabolic vs. Anoxic- EEG and initial head CT neg; apparently was following commands in native language on 5/23 Protein calorie malnutrition- present on  admission Recurrent fever- question inadequately treated HCAP  - Vanc/cefepime, check tracheal aspirate, Pct, change out foley, may need to change out lines if persistent - Continue DAPT - Continue amiodarone - Inotropes and diuresis per CHF team - SAT/SBT this AM and potential extubation - If breathing an issue can consider diagnostic/therapeutic tap  Best practice   Diet:  NPO, TF at goal  Pain/Anxiety/Delirium protocol (if indicated): Wean VAP protocol (if indicated): Yes DVT prophylaxis: LMWH and SCD GI prophylaxis: H2B Glucose control:  SSI moderate Central venous access:  Yes, and it is still needed Arterial line:  off Foley:  Yes, and it is still needed Mobility:  bed rest  PT consulted: N/A Last date of multidisciplinary goals of care discussion: called daughter, no answer; hopefully they can come in today Code Status:  full code Disposition: ICU pending vent and inotrope liberation   Patient critically ill due to respiratory failure Interventions to address this today sedation and ventilator weaning Risk of deterioration without these interventions is high  I personally spent 44 minutes providing critical care not including any separately billable procedures  Myrla Halsted MD [facility]  Prefer epic messenger for cross cover needs If after hours, please call E-link

## 2020-07-31 NOTE — Procedures (Signed)
Extubation Procedure Note  Patient Details:   Name: Rahil Passey DOB: 07/13/1955 MRN: [id number]   Airway Documentation:    Vent end date: (not recorded) Vent end time: (not recorded)   Evaluation  O2 sats: stable throughout Complications: No apparent complications Patient did tolerate procedure well. Bilateral Breath Sounds: Clear,Diminished   Yes  Pt extubated to 3L [place]. Per MD order RN at bedside.  Rosalita Levan 07/31/2020, 8:46 AM

## 2020-08-01 ENCOUNTER — Inpatient Hospital Stay (HOSPITAL_COMMUNITY): Payer: Medicare Other

## 2020-08-01 DIAGNOSIS — J90 Pleural effusion, not elsewhere classified: Secondary | ICD-10-CM

## 2020-08-01 DIAGNOSIS — R570 Cardiogenic shock: Secondary | ICD-10-CM | POA: Diagnosis not present

## 2020-08-01 DIAGNOSIS — J9601 Acute respiratory failure with hypoxia: Secondary | ICD-10-CM | POA: Diagnosis not present

## 2020-08-01 DIAGNOSIS — I2102 ST elevation (STEMI) myocardial infarction involving left anterior descending coronary artery: Secondary | ICD-10-CM | POA: Diagnosis not present

## 2020-08-01 LAB — GLUCOSE, CAPILLARY
Glucose-Capillary: 209 mg/dL — ABNORMAL HIGH (ref 70–99)
Glucose-Capillary: 217 mg/dL — ABNORMAL HIGH (ref 70–99)
Glucose-Capillary: 234 mg/dL — ABNORMAL HIGH (ref 70–99)
Glucose-Capillary: 237 mg/dL — ABNORMAL HIGH (ref 70–99)
Glucose-Capillary: 244 mg/dL — ABNORMAL HIGH (ref 70–99)
Glucose-Capillary: 268 mg/dL — ABNORMAL HIGH (ref 70–99)

## 2020-08-01 LAB — COOXEMETRY PANEL
Carboxyhemoglobin: 1.3 % (ref 0.5–1.5)
Methemoglobin: 0.9 % (ref 0.0–1.5)
O2 Saturation: 54.6 %
Total hemoglobin: 10.9 g/dL — ABNORMAL LOW (ref 12.0–16.0)

## 2020-08-01 LAB — PROTIME-INR
INR: 1.2 (ref 0.8–1.2)
Prothrombin Time: 14.8 seconds (ref 11.4–15.2)

## 2020-08-01 LAB — CBC
HCT: 30.3 % — ABNORMAL LOW (ref 39.0–52.0)
Hemoglobin: 9.4 g/dL — ABNORMAL LOW (ref 13.0–17.0)
MCH: 29.9 pg (ref 26.0–34.0)
MCHC: 31 g/dL (ref 30.0–36.0)
MCV: 96.5 fL (ref 80.0–100.0)
Platelets: 408 10*3/uL — ABNORMAL HIGH (ref 150–400)
RBC: 3.14 MIL/uL — ABNORMAL LOW (ref 4.22–5.81)
RDW: 15.9 % — ABNORMAL HIGH (ref 11.5–15.5)
WBC: 21.8 10*3/uL — ABNORMAL HIGH (ref 4.0–10.5)
nRBC: 0 % (ref 0.0–0.2)

## 2020-08-01 LAB — LACTATE DEHYDROGENASE: LDH: 389 U/L — ABNORMAL HIGH (ref 98–192)

## 2020-08-01 LAB — COMPREHENSIVE METABOLIC PANEL
ALT: 81 U/L — ABNORMAL HIGH (ref 0–44)
AST: 61 U/L — ABNORMAL HIGH (ref 15–41)
Albumin: 2 g/dL — ABNORMAL LOW (ref 3.5–5.0)
Alkaline Phosphatase: 104 U/L (ref 38–126)
Anion gap: 10 (ref 5–15)
BUN: 30 mg/dL — ABNORMAL HIGH (ref 8–23)
CO2: 27 mmol/L (ref 22–32)
Calcium: 8.4 mg/dL — ABNORMAL LOW (ref 8.9–10.3)
Chloride: 109 mmol/L (ref 98–111)
Creatinine, Ser: 1 mg/dL (ref 0.61–1.24)
GFR, Estimated: >60 mL/min (ref >60)
Glucose, Bld: 254 mg/dL — ABNORMAL HIGH (ref 70–99)
Potassium: 3.9 mmol/L (ref 3.5–5.1)
Sodium: 146 mmol/L — ABNORMAL HIGH (ref 135–145)
Total Bilirubin: 0.8 mg/dL (ref 0.3–1.2)
Total Protein: 6.1 g/dL — ABNORMAL LOW (ref 6.5–8.1)

## 2020-08-01 LAB — PROTEIN, PLEURAL OR PERITONEAL FLUID: Total protein, fluid: 3.2 g/dL

## 2020-08-01 LAB — PROTEIN, TOTAL: Total Protein: 6 g/dL — ABNORMAL LOW (ref 6.5–8.1)

## 2020-08-01 LAB — LACTATE DEHYDROGENASE, PLEURAL OR PERITONEAL FLUID: LD, Fluid: 2042 U/L — ABNORMAL HIGH (ref 3–23)

## 2020-08-01 LAB — BODY FLUID CELL COUNT WITH DIFFERENTIAL
Eos, Fluid: 4 %
Lymphs, Fluid: 21 %
Monocyte-Macrophage-Serous Fluid: 24 % — ABNORMAL LOW (ref 50–90)
Neutrophil Count, Fluid: 51 % — ABNORMAL HIGH (ref 0–25)
Total Nucleated Cell Count, Fluid: 2222 cu mm — ABNORMAL HIGH (ref 0–1000)

## 2020-08-01 LAB — MAGNESIUM: Magnesium: 2.4 mg/dL (ref 1.7–2.4)

## 2020-08-01 MED ORDER — ORAL CARE MOUTH RINSE
15.0000 mL | Freq: Two times a day (BID) | OROMUCOSAL | Status: DC
  Administered 2020-08-01 – 2020-08-10 (×14): 15 mL via OROMUCOSAL

## 2020-08-01 MED ORDER — FAMOTIDINE 20 MG PO TABS
20.0000 mg | ORAL_TABLET | Freq: Two times a day (BID) | ORAL | Status: DC
  Administered 2020-08-01 – 2020-08-10 (×16): 20 mg
  Filled 2020-08-01 (×17): qty 1

## 2020-08-01 MED ORDER — DOXAZOSIN MESYLATE 2 MG PO TABS
2.0000 mg | ORAL_TABLET | Freq: Every day | ORAL | Status: AC
  Administered 2020-08-01 – 2020-08-03 (×3): 2 mg
  Filled 2020-08-01 (×3): qty 1

## 2020-08-01 MED ORDER — OSMOLITE 1.5 CAL PO LIQD
1000.0000 mL | ORAL | Status: DC
  Administered 2020-08-01 – 2020-08-09 (×6): 1000 mL
  Filled 2020-08-01 (×11): qty 1000

## 2020-08-01 MED ORDER — INSULIN ASPART 100 UNIT/ML IJ SOLN
3.0000 [IU] | INTRAMUSCULAR | Status: DC
  Administered 2020-08-01 – 2020-08-10 (×46): 3 [IU] via SUBCUTANEOUS

## 2020-08-01 MED ORDER — AMIODARONE HCL 200 MG PO TABS
200.0000 mg | ORAL_TABLET | Freq: Every day | ORAL | Status: DC
  Administered 2020-08-01 – 2020-08-06 (×6): 200 mg
  Filled 2020-08-01 (×6): qty 1

## 2020-08-01 MED ORDER — NICOTINE 14 MG/24HR TD PT24
14.0000 mg | MEDICATED_PATCH | Freq: Every day | TRANSDERMAL | Status: DC
  Administered 2020-08-01 – 2020-08-10 (×10): 14 mg via TRANSDERMAL
  Filled 2020-08-01 (×10): qty 1

## 2020-08-01 MED ORDER — DIGOXIN 125 MCG PO TABS
0.1250 mg | ORAL_TABLET | Freq: Every day | ORAL | Status: DC
  Administered 2020-08-02 – 2020-08-10 (×9): 0.125 mg
  Filled 2020-08-01 (×9): qty 1

## 2020-08-01 MED ORDER — OXYCODONE HCL 5 MG PO TABS
5.0000 mg | ORAL_TABLET | Freq: Three times a day (TID) | ORAL | Status: DC
Start: 2020-08-01 — End: 2020-08-10
  Administered 2020-08-01 – 2020-08-10 (×24): 5 mg
  Filled 2020-08-01 (×25): qty 1

## 2020-08-01 MED ORDER — DIGOXIN 125 MCG PO TABS
0.1250 mg | ORAL_TABLET | Freq: Every day | ORAL | Status: DC
  Administered 2020-08-01: 0.125 mg via ORAL
  Filled 2020-08-01: qty 1

## 2020-08-01 MED ORDER — HALOPERIDOL LACTATE 5 MG/ML IJ SOLN
5.0000 mg | Freq: Four times a day (QID) | INTRAMUSCULAR | Status: DC | PRN
  Administered 2020-08-01 – 2020-08-06 (×7): 5 mg via INTRAVENOUS
  Filled 2020-08-01 (×7): qty 1

## 2020-08-01 MED ORDER — GUAIFENESIN-DM 100-10 MG/5ML PO SYRP
10.0000 mL | ORAL_SOLUTION | ORAL | Status: DC | PRN
  Administered 2020-08-01 – 2020-08-05 (×5): 10 mL
  Filled 2020-08-01 (×6): qty 10

## 2020-08-01 MED ORDER — GUAIFENESIN-DM 100-10 MG/5ML PO SYRP: 10.0000 mL | ORAL_SOLUTION | ORAL | Status: DC | PRN

## 2020-08-01 MED ORDER — MIDODRINE HCL 5 MG PO TABS
10.0000 mg | ORAL_TABLET | Freq: Three times a day (TID) | ORAL | Status: DC
  Administered 2020-08-01 – 2020-08-02 (×6): 10 mg
  Filled 2020-08-01 (×6): qty 2

## 2020-08-01 MED ORDER — PROSOURCE TF PO LIQD
45.0000 mL | Freq: Two times a day (BID) | ORAL | Status: DC
  Administered 2020-08-01 – 2020-08-09 (×17): 45 mL
  Filled 2020-08-01 (×20): qty 45

## 2020-08-01 NOTE — Progress Notes (Signed)
Inpatient Diabetes Program Recommendations  AACE/ADA: New Consensus Statement on Inpatient Glycemic Control (2015)  Target Ranges:  Prepandial:   less than 140 mg/dL      Peak postprandial:   less than 180 mg/dL (1-2 hours)      Critically ill patients:  140 - 180 mg/dL   Lab Results  Component Value Date   GLUCAP 244 (H) 08/01/2020   HGBA1C 6.5 (H) 07/21/2020    Review of Glycemic Control Results for Vincent Horton, Vincent Horton (MRN [id number]) as of 08/01/2020 11:51  Ref. Range 07/31/2020 20:39 08/01/2020 00:52 08/01/2020 03:42 08/01/2020 07:44 08/01/2020 11:17  Glucose-Capillary Latest Ref Range: 70 - 99 mg/dL 009 (H) 381 (H) 829 (H) 237 (H) 244 (H)   Outpatient Diabetes medications:  None Current orders for Inpatient glycemic control:  Novolog moderate q 4 hours  Inpatient Diabetes Program Recommendations:    While on steroids/tube feeds, consider adding Novolog tube feed coverage 3 units q 4 hours.   Thanks, Beryl Meager, RN, BC-ADM Inpatient Diabetes Coordinator Pager [phone] (8a-5p)

## 2020-08-01 NOTE — Progress Notes (Signed)
Inpatient Rehab Admissions Coordinator Note:   Per PT recommendations, pt was screened for CIR candidacy by Estill Dooms, PT, DPT.  At this time note pt high risk for reintubation/trach.  We will follow for 1-2 more days to assess stability and then rescreen.  Please contact me with questions.   Estill Dooms, PT, DPT [phone] 08/01/20 1:58 PM

## 2020-08-01 NOTE — Progress Notes (Signed)
Occupational Therapy Evaluation Patient Details Name: Vincent Horton MRN: [id number] DOB: Jan 20, 1956 Today's Date: 08/01/2020  Clinical Impression: Pt from home, typically independent in ADL and mobility. Does not use DME and still driving, enjoys fixing small moving parts objects like watches. Pt today has limited communication post extubation with very soft voice indicating multiple times that his throat hurt (did use Somali interpreter). Pt today is mod A for all aspects of ADL - he is able to bring his hand to his face but needs multimodal cues for following commands (improved throughout session), mod A to stand from EOB. Pt flat affect throughout session. Painful throughout throat and chest. VSS on RA throughout session. At this time recommending CIR level  therapies post-acute to return to PLOF as Pt has good support at home from family (lives with his 3 daughters)  Of note: Pt with very poor dentation. Apparently family was working with dentist to get all his teeth pulled and get dentures so they were blending most of his food to try and increase his intake as his mouth is too sore to really chew.     08/01/20 1100  OT Visit Information  Last OT Received On 08/01/20  Assistance Needed +2 (for mobility progression)  PT/OT/SLP Co-Evaluation/Treatment Yes  Reason for Co-Treatment Complexity of the patient's impairments (multi-system involvement);For patient/therapist safety;To address functional/ADL transfers  OT goals addressed during session ADL's and self-care;Strengthening/ROM  SLP goals addressed during session Swallowing  History of Present Illness Pt is a 65 y/o male seen on 07/21/2020 for evaluation of STEMI. Upon EMS arrival, pt apneic and was in VFib and shocked 5 times with 15-min CPR before ROSC achieved. Pt intubated on site (5/21) and transferred to [facility]. Recent CXR findings reveal diffuse bilateral pulmonary infiltrates/edema, progressed from  prior exam. Bibasilar atelectasis. Small  right pleural effusion. Pt extubated on 07/31/2020. PMH includes cardiac arrest, CAD, dysrhythmia, myocardial infarction.  Precautions  Precautions Fall  Restrictions  Weight Bearing Restrictions No  Home Living  Family/patient expects to be discharged to: Private residence  Living Arrangements Children (3 daughters)  Available Help at Discharge Family;Available 24 hours/day  Type of Home House  Home Access Stairs to enter  Entrance Stairs-Number of Steps 3  Home Layout One level  Bathroom Environmental health practitioner None  Additional Comments confirmed home set up with son via phone  Prior Function  Level of Independence Independent  Comments does not work, but drives, walks without DME and enjoys community mobility, also enjoys fixing things like watches  Communication  Communication Prefers language other than English;Other (comment);Interpreter utilized (Somali interpreter: [place] [phone] used; very soft voice post-extubation)  Pain Assessment  Pain Assessment Faces  Faces Pain Scale 6  Pain Location chest and neck (IV IV site)  Pain Descriptors / Indicators Constant;Grimacing;Guarding  Pain Intervention(s) Limited activity within patient's tolerance;Monitored during session;Repositioned  Cognition  Arousal/Alertness Awake/alert  Behavior During Therapy WFL for tasks assessed/performed  Overall Cognitive Status Impaired/Different from baseline  Area of Impairment Memory;Following commands;Awareness;Problem solving  Memory Decreased short-term memory  Following Commands Follows one step commands inconsistently (improved throughout session)  Awareness Emergent  Problem Solving Decreased initiation;Difficulty sequencing;Requires verbal cues;Requires tactile cues  General Comments Cognition seeming to improve thoughout session. Also cog vs language/cultural barrier should be considered. Pt is originally from [place] (has been in [place] since  1997)  Upper Extremity Assessment  Upper Extremity Assessment Generalized weakness  Lower Extremity Assessment  Lower Extremity Assessment Defer to  PT evaluation  Cervical / Trunk Assessment  Cervical / Trunk Assessment Other exceptions (fed head, rounded shoulders, tender from CPR)  ADL  Overall ADL's  Needs assistance/impaired  Eating/Feeding NPO  Grooming Moderate assistance;Sitting  Grooming Details (indicate cue type and reason) able to bring hand to face, fatigues quickly  Upper Body Bathing Moderate assistance  Lower Body Bathing Maximal assistance  Upper Body Dressing  Moderate assistance  Lower Body Dressing Maximal assistance  Toilet Transfer Moderate assistance  Toileting- Clothing Manipulation and Hygiene Maximal assistance  Functional mobility during ADLs Moderate assistance  General ADL Comments poor dentation at baseline, was working with  Bed Mobility  Overal bed mobility Needs Assistance  Bed Mobility Supine to Sit;Sit to Supine  Supine to sit Mod assist (assist for legs to EOB, trunk elevation)  Sit to supine Mod assist;HOB elevated (assist for legs back in bed)  General bed mobility comments multimodal cues and assist from interpreter (with cues) in addition to physical assist  Transfers  Overall transfer level Needs assistance  Equipment used 1 person hand held assist  Transfers Sit to/from Stand  Sit to Stand Mod assist  General transfer comment Pt braces legs against the bed to assist with rise in addition to mod boost from therapist, unable to take steps up the bed today, but did assist with lateral scoot  Balance  Overall balance assessment Needs assistance  Sitting-balance support Single extremity supported;Feet supported  Sitting balance-Leahy Scale Poor  Standing balance support Bilateral upper extremity supported  Standing balance-Leahy Scale Poor  Standing balance comment dependent on OT in standing, legs supported by bed from behind  General  Comments  General comments (skin integrity, edema, etc.) VSS throughout session. Pt with SpO2 >90% on RA - RN and MD aware and Pt left off of O2 at end of session  OT - End of Session  Equipment Utilized During Treatment Gait belt  Activity Tolerance Patient tolerated treatment well  Patient left in bed;with call bell/phone within reach;with bed alarm set;with nursing/sitter in room  Nurse Communication Mobility status  OT Assessment  OT Recommendation/Assessment Patient needs continued OT Services  OT Visit Diagnosis Unsteadiness on feet (R26.81);Other abnormalities of gait and mobility (R26.89);Muscle weakness (generalized) (M62.81);Other symptoms and signs involving cognitive function  OT Problem List Decreased strength;Decreased activity tolerance;Impaired balance (sitting and/or standing);Decreased cognition;Decreased safety awareness;Decreased knowledge of use of DME or AE;Decreased knowledge of precautions;Cardiopulmonary status limiting activity;Pain  OT Plan  OT Frequency (ACUTE ONLY) Min 2X/week  OT Treatment/Interventions (ACUTE ONLY) Self-care/ADL training;Therapeutic exercise;DME and/or AE instruction;Energy conservation;Therapeutic activities;Patient/family education;Balance training  AM-PAC OT "6 Clicks" Daily Activity Outcome Measure (Version 2)  Help from another person eating meals? 1 (NPO)  Help from another person taking care of personal grooming? 2  Help from another person toileting, which includes using toliet, bedpan, or urinal? 2  Help from another person bathing (including washing, rinsing, drying)? 2  Help from another person to put on and taking off regular upper body clothing? 2  Help from another person to put on and taking off regular lower body clothing? 2  6 Click Score 11  OT Recommendation  Recommendations for Other Services Rehab consult  Follow Up Recommendations CIR  OT Equipment Other (comment) (defer to next venue)  Individuals Consulted  Consulted  and Agree with Results and Recommendations Patient;Family member/caregiver  Family Member Consulted spoke with son over phone  Acute Rehab OT Goals  Patient Stated Goal "get Dad back to where he was"  OT Goal  Formulation With family  Time For Goal Achievement 08/15/20  Potential to Achieve Goals Good  OT Time Calculation  OT Start Time (ACUTE ONLY) 0916  OT Stop Time (ACUTE ONLY) 1016  OT Time Calculation (min) 60 min  OT General Charges  $OT Visit 1 Visit  OT Evaluation  $OT Eval Moderate Complexity 1 Mod  OT Treatments  $Self Care/Home Management  8-22 mins  $Therapeutic Activity 8-22 mins   Nyoka Cowden OTR/L Acute Rehabilitation Services Pager: [phone] Office: [phone]

## 2020-08-01 NOTE — Progress Notes (Signed)
eLink Physician-Brief Progress Note Patient Name: Vincent Horton DOB: 05-25-1955 MRN: [id number]   Date of Service  08/01/2020  HPI/Events of Note  Patient c/o cough.  eICU Interventions  Plan: 1. Robitussin DM 10 mL per tube now and Q 4 hours PRN cough 2. Portable CXR in AM,     Intervention Category Major Interventions: Other:  Lambert Jeanty Dennard Nip 08/01/2020, 3:20 AM

## 2020-08-01 NOTE — Progress Notes (Signed)
Patient ID: Vincent Horton, male   DOB: [birth year], 65 y.o.   MRN: [id number] P    Advanced Heart Failure Rounding Note  PCP-Cardiologist: None   Subjective:    Extubated yesterday. CXR with diffuse bilateral infiltrates, he is on ceftazidime/vancomycin. Afebrile, getting chest PT.   He is now off pressors, remains on milrinone 0.125.  Co-ox 55%. CVP 5.  MAP stable.   Echo: EF 30-35%, no LV thrombus, LAD territory WMAs, RV normal, IVC normal.    Objective:   Weight Range: 48.2 kg Body mass index is 18.24 kg/m.   Vital Signs:   Temp:  [98.2 F (36.8 C)-101.3 F (38.5 C)] 98.8 F (37.1 C) (05/31 2000) Pulse Rate:  [99-131] 103 (06/01 0724) Resp:  [13-38] 22 (06/01 0724) BP: (90-156)/(60-122) 115/81 (06/01 0700) SpO2:  [85 %-100 %] 99 % (06/01 0700) FiO2 (%):  [36 %] 36 % (06/01 0724) Weight:  [48.2 kg] 48.2 kg (06/01 0235) Last BM Date: 07/30/20  Weight change: Filed Weights   07/30/20 0415 07/31/20 0210 08/01/20 0235  Weight: 53.6 kg 50.8 kg 48.2 kg    Intake/Output:   Intake/Output Summary (Last 24 hours) at 08/01/2020 0736 Last data filed at 08/01/2020 0700 Gross per 24 hour  Intake 2016.63 ml  Output 4365 ml  Net -2348.37 ml      Physical Exam    CVP 5  General: NAD Neck: Crackles dependently, no thyromegaly or thyroid nodule.  Lungs: Clear to auscultation bilaterally with normal respiratory effort. CV: Nondisplaced PMI.  Heart regular S1/S2, no S3/S4, no murmur.  No peripheral edema.   Abdomen: Soft, nontender, no hepatosplenomegaly, no distention.  Skin: Intact without lesions or rashes.  Neurologic: Awake/alert.   Psych: Normal affect. Extremities: No clubbing or cyanosis.  HEENT: Normal.    Telemetry   Sinus tach 100s. Personally reviewed  Labs    CBC Recent Labs    07/30/20 0413 08/01/20 0343  WBC 17.5* 21.8*  HGB 8.4* 9.4*  HCT 26.4* 30.3*  MCV 95.3 96.5  PLT 389 408*   Basic Metabolic Panel Recent Labs    97/58/83 0334  08/01/20 0343  NA 144 146*  K 4.1 3.9  CL 109 109  CO2 29 27  GLUCOSE 167* 254*  BUN 30* 30*  CREATININE 1.09 1.00  CALCIUM 8.1* 8.4*  MG  --  2.4   Liver Function Tests Recent Labs    07/31/20 0334 08/01/20 0343  AST 55* 61*  ALT 82* 81*  ALKPHOS 97 104  BILITOT 0.6 0.8  PROT 5.8* 6.1*  ALBUMIN 1.9* 2.0*   No results for input(s): LIPASE, AMYLASE in the last 72 hours. Cardiac Enzymes No results for input(s): CKTOTAL, CKMB, CKMBINDEX, TROPONINI in the last 72 hours.  BNP: BNP (last 3 results) No results for input(s): BNP in the last 8760 hours.  ProBNP (last 3 results) No results for input(s): PROBNP in the last 8760 hours.   D-Dimer No results for input(s): DDIMER in the last 72 hours. Hemoglobin A1C No results for input(s): HGBA1C in the last 72 hours. Fasting Lipid Panel No results for input(s): CHOL, HDL, LDLCALC, TRIG, CHOLHDL, LDLDIRECT in the last 72 hours. Thyroid Function Tests No results for input(s): TSH, T4TOTAL, T3FREE, THYROIDAB in the last 72 hours.  Invalid input(s): FREET3  Other results:   Imaging    DG CHEST PORT 1 VIEW  Result Date: 08/01/2020 CLINICAL DATA:  Hypoxia. EXAM: PORTABLE CHEST 1 VIEW COMPARISON:  07/31/2020. FINDINGS: Interim extubation. Feeding tube noted with  tip coiled in stomach. Right IJ line noted with tip over the upper right atrium. Heart size normal. Diffuse bilateral pulmonary infiltrates/edema, progressed from prior exam. Bibasilar atelectasis. Small right pleural effusion. No pneumothorax. IMPRESSION: 1. Interim extubation. Feeding tube noted with tip coiled in stomach. Right IJ line noted with tip over the upper right atrium. 2. Diffuse bilateral pulmonary infiltrates/edema, progressed from prior exam. Bibasilar atelectasis. Small right pleural effusion. Electronically Signed   By: Maisie Fus  Register   On: 08/01/2020 06:52     Medications:     Scheduled Medications: . amiodarone  200 mg Per Tube BID  . aspirin   81 mg Per Tube Daily  . chlorhexidine  15 mL Mouth Rinse BID  . Chlorhexidine Gluconate Cloth  6 each Topical Daily  . clonazepam  0.5 mg Per Tube TID  . dexamethasone (DECADRON) injection  4 mg Intravenous Q12H  . digoxin  0.125 mg Oral Daily  . docusate  100 mg Per Tube BID  . enoxaparin (LOVENOX) injection  30 mg Subcutaneous Daily  . furosemide  40 mg Intravenous Daily  . insulin aspart  0-15 Units Subcutaneous Q4H  . ipratropium-albuterol  3 mL Nebulization QID  . mouth rinse  15 mL Mouth Rinse q12n4p  . midodrine  10 mg Per Tube TID WC  . oxyCODONE  10 mg Per Tube Q8H  . polyethylene glycol  17 g Per Tube Daily  . rosuvastatin  40 mg Per Tube Daily  . sodium chloride flush  10-40 mL Intracatheter Q12H  . sodium chloride flush  3 mL Intravenous Q12H  . ticagrelor  90 mg Per Tube BID    Infusions: . sodium chloride Stopped (07/31/20 1941)  . sodium chloride    . cefTAZidime (FORTAZ)  IV Stopped (08/01/20 0019)  . dexmedetomidine (PRECEDEX) IV infusion Stopped (08/01/20 0641)  . famotidine (PEPCID) IV Stopped (08/01/20 0115)  . feeding supplement (VITAL AF 1.2 CAL) 55 mL/hr at 08/01/20 0400  . HYDROmorphone Stopped (07/31/20 0807)  . milrinone 0.125 mcg/kg/min (08/01/20 0700)  . norepinephrine (LEVOPHED) Adult infusion Stopped (07/31/20 1804)  . vancomycin Stopped (07/31/20 1025)  . vasopressin Stopped (07/31/20 0750)    PRN Medications: Place/Maintain arterial line **AND** sodium chloride, acetaminophen (TYLENOL) oral liquid 160 mg/5 mL, guaiFENesin-dextromethorphan, HYDROmorphone, midazolam, ondansetron (ZOFRAN) IV, sodium chloride flush, sodium chloride flush    Assessment/Plan   1. CAD: Anterior STEMI with occluded proximal LAD.  Treated with DES but complicated by embolization to distal LAD (procedure completed with occluded distal LAD).  No chest pain.  - Continue ticagrelor and ASA 81.  - Crestor 40 daily. 2. Cardiogenic shock: Ischemic cardiomyopathy. Echo with  EF 30-35%, no LV thrombus, LAD territory WMAs, RV normal, IVC normal. Now off pressors, remains on milrinone 0.125.  Co-ox 55%.  CVP 5 with I/Os negative.   - Continue milrinone 0.125 today.  - Start to wean midodrine, decrease to 10 mg tid.  - No Lasix today with CVP 5.  - Add digoxin 0.125.  3. Acute hypoxemic respiratory failure: Now extubated.  Suspect HCAP. WBCs 21.8, now afebrile.  Finished Zosyn 5/29, started vancomycin/ceftazidime 5/31.  Cultures NGTD.  4. Cardiac arrest: VF arrest associated with STEMI. Patient shocked x 5, 15 min CPR before ROSC.  No further VT.  Now rewarmed s/p hypothermia protocol.  He has been revascularized.  - Will need Lifevest at discharge to assess for recovery of EF.  5. Anemia: Transfused 1 U on 5/27, Hgb now up to 9.4. 6. Paroxysmal  Atrial fibrillation: We have not seen any actual atrial fibrillation, has had ST with PACs.  Currently in ST 100s - Lovenox prophylaxis - Decrease amiodarone to 200 mg daily.   Needs PT/OT.   CRITICAL CARE Performed by: Marca Ancona  Total critical care time: 35 minutes  Critical care time was exclusive of separately billable procedures and treating other patients.  Critical care was necessary to treat or prevent imminent or life-threatening deterioration.  Critical care was time spent personally by me on the following activities: development of treatment plan with patient and/or surrogate as well as nursing, discussions with consultants, evaluation of patient's response to treatment, examination of patient, obtaining history from patient or surrogate, ordering and performing treatments and interventions, ordering and review of laboratory studies, ordering and review of radiographic studies, pulse oximetry and re-evaluation of patient's condition.  Marca Ancona 08/01/2020 7:42 AM

## 2020-08-01 NOTE — Evaluation (Signed)
Physical Therapy Evaluation Patient Details Name: Vincent Horton MRN: [id number] DOB: Jan 18, 1956 Today's Date: 08/01/2020   History of Present Illness  Pt is a 65 y/o male seen on 07/21/2020 for evaluation of STEMI. Upon EMS arrival, pt apneic and was in VFib and shocked 5 times with 15-min CPR before ROSC achieved. Pt intubated on site (5/21) and transferred to [facility]. Recent CXR findings reveal diffuse bilateral pulmonary infiltrates/edema, progressed from  prior exam. Bibasilar atelectasis. Small right pleural effusion. Pt extubated on 07/31/2020. PMH includes cardiac arrest, CAD, dysrhythmia, myocardial infarction.  Clinical Impression  Pt presents to PT with deficits in functional mobility, gait, balance, endurance, strength, power, cognition. Pt appears limited by fatigue, requiring significant assistance to maintain balance when sitting or standing after initially needing little assistance to do so. Pt currently requires physical assistance for all functional mobility, as well as frequent cueing to maintain safety. Pt will benefit from continued aggressive mobilization and PT services to improve activity tolerance and mobility quality while also reducing falls risk. PT recommends CIR placement at this time to aide in a return to independence.    Follow Up Recommendations CIR    Equipment Recommendations  Wheelchair (measurements PT);Wheelchair cushion (measurements PT);Hospital bed (mechanical lift, all if D/C home today)    Recommendations for Other Services Rehab consult     Precautions / Restrictions Precautions Precautions: Fall Restrictions Weight Bearing Restrictions: No      Mobility  Bed Mobility Overal bed mobility: Needs Assistance Bed Mobility: Supine to Sit     Supine to sit: Mod assist;HOB elevated     General bed mobility comments: tactile cueing to initiate mobility, physical assistance to elevate trunk into sitting    Transfers Overall transfer level: Needs  assistance Equipment used: 1 person hand held assist Transfers: Sit to/from [employer] Transfers Sit to Stand: Mod assist Stand pivot transfers: Max assist       General transfer comment: pt requires assitance to power up into standing as well as physical assistance to pivot. Limited awareness of safety, nearly stopping short of recliner during pivot  Ambulation/Gait Ambulation/Gait assistance:  (deferred 2/2 safety concerns)              Stairs            Wheelchair Mobility    Modified Rankin (Stroke Patients Only)       Balance Overall balance assessment: Needs assistance Sitting-balance support: Single extremity supported;Bilateral upper extremity supported;Feet supported Sitting balance-Leahy Scale: Zero (minA with periods of maxA with pt leaning forward abruptly)   Postural control:  (anterior lean) Standing balance support: Bilateral upper extremity supported Standing balance-Leahy Scale: Poor Standing balance comment: maxA                             Pertinent Vitals/Pain Pain Assessment: Faces Faces Pain Scale: Hurts little more Pain Location: chest Pain Descriptors / Indicators: Grimacing (with mobility) Pain Intervention(s): Monitored during session    Home Living Family/patient expects to be discharged to:: Private residence Living Arrangements: Children (3 daughters) Available Help at Discharge: Family;Available 24 hours/day Type of Home: House Home Access:  (unsure)   Entrance Stairs-Number of Steps: 3 Home Layout: One level Home Equipment: None Additional Comments: PT history obtained from conversation with OT (based on phone call with pt's family), pt with difficulty verbalizing during session.    Prior Function Level of Independence: Independent         Comments: does  not work, but drives, walks without DME and enjoys community mobility, also enjoys fixing things like watches     Hand Dominance         Extremity/Trunk Assessment   Upper Extremity Assessment Upper Extremity Assessment: Generalized weakness    Lower Extremity Assessment Lower Extremity Assessment: Generalized weakness    Cervical / Trunk Assessment Cervical / Trunk Assessment: Kyphotic  Communication   Communication:  (pt declines use of Somali interpreter during this session)  Cognition Arousal/Alertness: Awake/alert (maintains eyes closed for much of session but does intermittently open) Behavior During Therapy: Flat affect Overall Cognitive Status: Difficult to assess Area of Impairment: Following commands                     Memory: Decreased short-term memory Following Commands: Follows one step commands with increased time   Awareness: Emergent Problem Solving: Decreased initiation;Difficulty sequencing;Requires verbal cues;Requires tactile cues General Comments: pt with limited audible verbal communication this session but does respond well and appropriately to English during this session. Pt requires increased time to follow commands and confirms some of history obtained from OT prior to session      General Comments General comments (skin integrity, edema, etc.): pt BP 89/61 (71) pre-mobility, 108/68 (79) post-mobility. HR and SpO2 stable.    Exercises     Assessment/Plan    PT Assessment Patient needs continued PT services  PT Problem List Decreased strength;Decreased activity tolerance;Decreased balance;Decreased mobility;Decreased cognition;Decreased safety awareness;Decreased knowledge of precautions;Cardiopulmonary status limiting activity;Pain       PT Treatment Interventions Gait training;DME instruction;Stair training;Functional mobility training;Therapeutic activities;Therapeutic exercise;Neuromuscular re-education;Balance training;Cognitive remediation;Patient/family education    PT Goals (Current goals can be found in the Care Plan section)  Acute Rehab PT Goals Patient Stated  Goal: pt does not state this session, PT goal to return to ambulating PT Goal Formulation: With patient Time For Goal Achievement: 08/15/20 Potential to Achieve Goals: Good    Frequency Min 3X/week   Barriers to discharge        Co-evaluation   Reason for Co-Treatment: Complexity of the patient's impairments (multi-system involvement);For patient/therapist safety;To address functional/ADL transfers   OT goals addressed during session: ADL's and self-care;Strengthening/ROM SLP goals addressed during session: Swallowing     AM-PAC PT "6 Clicks" Mobility  Outcome Measure Help needed turning from your back to your side while in a flat bed without using bedrails?: A Little Help needed moving from lying on your back to sitting on the side of a flat bed without using bedrails?: A Lot Help needed moving to and from a bed to a chair (including a wheelchair)?: A Lot Help needed standing up from a chair using your arms (e.g., wheelchair or bedside chair)?: A Lot Help needed to walk in hospital room?: Total Help needed climbing 3-5 steps with a railing? : Total 6 Click Score: 11    End of Session   Activity Tolerance: Patient limited by fatigue Patient left: in chair;with call bell/phone within reach (RN Celenia declines need for chair alarm at this time, bed alarm off upon PT arrival) Nurse Communication: Mobility status PT Visit Diagnosis: Unsteadiness on feet (R26.81);Other abnormalities of gait and mobility (R26.89);Muscle weakness (generalized) (M62.81);Pain Pain - part of body:  (chest)    Time: 5400-8676 PT Time Calculation (min) (ACUTE ONLY): 21 min   Charges:   PT Evaluation $PT Eval Moderate Complexity: 1 Mod          Arlyss Gandy, PT, DPT Acute Rehabilitation Pager:  [phone]   Arlyss Gandy 08/01/2020, 12:45 PM

## 2020-08-01 NOTE — Procedures (Signed)
Thoracentesis  Procedure Note  Vincent Horton  [id number]  02/14/1956  Date:08/01/20  Time:2:28 PM   Provider Performing:Murna Backer Kathie Rhodes Greggory Stallion   Procedure: Thoracentesis with imaging guidance (08676)  Indication(s) Pleural Effusion  Consent Risks of the procedure as well as the alternatives and risks of each were explained to the patient and/or caregiver.  Consent for the procedure was obtained and is signed in the bedside chart  Anesthesia Topical only with 1% lidocaine    Time Out Verified patient identification, verified procedure, site/side was marked, verified correct patient position, special equipment/implants available, medications/allergies/relevant history reviewed, required imaging and test results available.   Sterile Technique Maximal sterile technique including full sterile barrier drape, hand hygiene, sterile gown, sterile gloves, mask, hair covering, sterile ultrasound probe cover (if used).  Procedure Description Ultrasound was used to identify appropriate pleural anatomy for placement and overlying skin marked.  Area of drainage cleaned and draped in sterile fashion. Lidocaine was used to anesthetize the skin and subcutaneous tissue.  450 cc's of bloody appearing fluid was drained from the right pleural space. Catheter then removed and bandaid applied to site.   Complications/Tolerance None; patient tolerated the procedure well. Chest X-ray is ordered to confirm no post-procedural complication.   EBL Minimal   Specimen(s) Pleural fluid   Gershon Mussel., MSN, APRN, AGACNP-BC [facility]  08/01/2020 , 2:29 PM  Please see [email or web address] for pager details  If no response, please call [phone] After hours, please call Elink at [phone]

## 2020-08-01 NOTE — Evaluation (Signed)
Clinical/Bedside Swallow Evaluation Patient Details  Name: Vincent Horton MRN: [id number] Date of Birth: 1956-02-14  Today's Date: 08/01/2020 Time: SLP Start Time (ACUTE ONLY): 1100 SLP Stop Time (ACUTE ONLY): 1120 SLP Time Calculation (min) (ACUTE ONLY): 20 min  Past Medical History:  Past Medical History:  Diagnosis Date  . Cardiac arrest (HCC)   . Coronary artery disease   . Dysrhythmia   . Myocardial infarction [facility])    Past Surgical History:  Past Surgical History:  Procedure Laterality Date  . CARDIAC CATHETERIZATION    . CORONARY/GRAFT ACUTE MI REVASCULARIZATION N/A 07/21/2020   Procedure: Coronary/Graft Acute MI Revascularization;  Surgeon: Swaziland, Peter M, MD;  Location: [facility] INVASIVE CV LAB;  Service: Cardiovascular;  Laterality: N/A;  . LEFT HEART CATH AND CORONARY ANGIOGRAPHY N/A 07/21/2020   Procedure: LEFT HEART CATH AND CORONARY ANGIOGRAPHY;  Surgeon: Swaziland, Peter M, MD;  Location: [facility] INVASIVE CV LAB;  Service: Cardiovascular;  Laterality: N/A;  . RIGHT HEART CATH N/A 07/21/2020   Procedure: RIGHT HEART CATH;  Surgeon: Swaziland, Peter M, MD;  Location: [facility] INVASIVE CV LAB;  Service: Cardiovascular;  Laterality: N/A;   HPI:  Pt is a 65 yo male found down by family VF arrest associated with STEMI, shocked 5x, 15 min CPR before ROSC achieved. ETT 5/21-5/31. CXR 5/31 with persistent bibasilar infiltrates/edema. PMH includes cardiac arrest, CAD, dysrhythmia, myocardial infarction. Per pt's son, pt's oral intake at home was limited to very soft and/or liquidised foods PTA due to condition of dentition.   Assessment / Plan / Recommendation Clinical Impression  Pt was seen for a swallowing evaluation with OT to assist with safe positioning nad [employer] interpreter ([place] [phone]) to facilitate language barrier. He appears to have a significant post-extubation dysphagia with no evidence of audible vocal fold adduction across attempts at vocalizing, verbalizing, or coughing (both spontaneously  and cued). He allows ice chips to melt in his mouth but then orally holds them, shaking his head "no" when asked to swallow and ultimately needing the bolus to be suctioned from his oral cavity. He did swallow a small amount of water, but this was followed by a coughing episode that was mildly prolonged but nonproductive and still without any audible adduction. In discussion with his son via phone, pt had no difficulties with swallowing PTA but did have a lot of trouble with mastication due to limited number and condition of dentition. They were in the process of trying to obtain dentures but he had not yet had an appointment. Recommend that pt remain NPO for now with temporary, alternative means of nutrition. Given baseline level of function reviewed with son, recommend CIR for more intensive f/u for pt to work on progressing his oropharyngeal swallowing. SLP Visit Diagnosis: Dysphagia, unspecified (R13.10)    Aspiration Risk  Severe aspiration risk;Risk for inadequate nutrition/hydration    Diet Recommendation NPO;Alternative means - temporary   Medication Administration: Via alternative means    Other  Recommendations Oral Care Recommendations: Oral care QID Other Recommendations: Have oral suction available   Follow up Recommendations Inpatient Rehab      Frequency and Duration min 2x/week  2 weeks       Prognosis Prognosis for Safe Diet Advancement: Good      Swallow Study   General HPI: Pt is a 65 yo male found down by family VF arrest associated with STEMI, shocked 5x, 15 min CPR before ROSC achieved. ETT 5/21-5/31. CXR 5/31 with persistent bibasilar infiltrates/edema. PMH includes cardiac arrest, CAD, dysrhythmia, myocardial  infarction. Per pt's son, pt's oral intake at home was limited to very soft and/or liquidised foods PTA due to condition of dentition. Type of Study: Bedside Swallow Evaluation Previous Swallow Assessment: none in chart Diet Prior to this Study: NPO;NG  Tube Temperature Spikes Noted: Yes (101.48) Respiratory Status: Room air History of Recent Intubation: Yes Length of Intubations (days): 10 days Date extubated: 07/31/20 Behavior/Cognition: Alert;Cooperative;Requires cueing Oral Cavity Assessment:  (mild secretions that are thick, white) Oral Care Completed by SLP: No Oral Cavity - Dentition: Poor condition;Missing dentition Self-Feeding Abilities: Needs assist Patient Positioning:  (EOB) Baseline Vocal Quality: Aphonic Volitional Cough: Weak (no audible adduction) Volitional Swallow: Unable to elicit    Oral/Motor/Sensory Function Overall Oral Motor/Sensory Function: Generalized oral weakness (not following all commands for thorough assessment though)   Ice Chips Ice chips: Impaired Presentation: Spoon Oral Phase Functional Implications: Oral holding;Other (comment) (suctioned out of his mouth) Pharyngeal Phase Impairments: Unable to trigger swallow   Thin Liquid Thin Liquid: Impaired Presentation: Spoon Pharyngeal  Phase Impairments: Cough - Immediate;Cough - Delayed    Nectar Thick Nectar Thick Liquid: Not tested   Honey Thick Honey Thick Liquid: Not tested   Puree Puree: Not tested   Solid     Solid: Not tested      Mahala Menghini., M.A. CCC-SLP Acute Rehabilitation Services Pager [phone] Office [phone]  08/01/2020,11:06 AM

## 2020-08-01 NOTE — Progress Notes (Signed)
NAMETymeer Horton, MRN:  [id number], DOB:  [birth year], LOS: 11 ADMISSION DATE:  07/21/2020, CONSULTATION DATE:  07/22/2019 REFERRING MD:  [place], CHIEF COMPLAINT:  Post arrest   History of Present Illness:  Patient is a 65 year old male with limited medical history found down by family, EMS was called, he was found to be in V. fib shocked 5 x 3 mg epinephrine 450 mg of amiodarone with ROSC obtained.  He was emergently taken to the Cath Lab showing ST segment changes in anterior lateral leads status post revascularization of LAD.  He was noted to have been moving spontaneously at time of cardiac catheterization.  Transferred to the ICU.  PCCM consulted for help with management  Pertinent  Medical History    has a past medical history of Cardiac arrest [facility]), Coronary artery disease, Dysrhythmia, and Myocardial infarction (HCC).  Significant Hospital Events: Including procedures, antibiotic start and stop dates in addition to other pertinent events    5/21 Vfib arrest, Cardiac catheterization, TTM started, right IJ placed  5/22 remains on NE/ milrinone, sedated on fentanyl versed   5/23 aline placed, concern for afib controlled on IV amio, f/c this am with family  5/24 ongoing agitation, pressor requirements  5/25  Issues with sedation overnight, Remains on significant vasopressor support  5/26 Remains on significant vasopressor support, started on midodrine  5/29 remains on NEPI, Vaso, Milirinone   5/31 Extubated   5/21 MRSA neg 5/22 BCx2 >> neg 5/26 trach asp >> ngtd  5/21 zosyn >> (last dose 5/27)  Interim History / Subjective:  Tmax 101.4 HR 99-131 BP 90/69 to 156/122  -2.5L admit, -2.3L past 24, 4.3 L uop  On 0.125 milrinone  Extubated 5/31 AM  Subjective exam completed with translator [place] [phone]) complaints of SOB and chest pain when coughing. Difficult with translator due to patient horaseness   Objective   Blood pressure 105/66, pulse (!) 104, temperature  99.2 F (37.3 C), temperature source Axillary, resp. rate (!) 32, height 5\' 4"  (1.626 m), weight 48.2 kg, SpO2 98 %. on 2L [place] CVP:  [3 mmHg-20 mmHg] 3 mmHg  FiO2 (%):  [36 %] 36 %   Intake/Output Summary (Last 24 hours) at 08/01/2020 0925 Last data filed at 08/01/2020 0809 Gross per 24 hour  Intake 2001.27 ml  Output 4115 ml  Net -2113.73 ml   Filed Weights   07/30/20 0415 07/31/20 0210 08/01/20 0235  Weight: 53.6 kg 50.8 kg 48.2 kg    Examination:  General: Frail, in bed, no acute distress   HEENT: MM pink/moist,anicteric, trachea midline  Neuro: MAE, eyes open spontaneously, purposeful, difficult to understand with translator, RASS 0, PERRL 70mm CV: S1S2, ST on monitor, no m/r/g appreciated PULM:  Clear in the upper lobes, diminished in the lower lobes, scant secretions, chest expansion symmetric GI: soft, bsx4 active, nontender   Extremities: warm/dry, no pretibial edema, capillary refill less than 3 seconds  Skin: no rashes or lesions noted   Labs/imaging that I have personally reviewed   CXR 5/31 continued R pleural effusion, L lung more clear CXR 6/1- R pleural effusion continues CBC- WBC increasing BMP  Resolved Hospital Problem list     Assessment & Plan:   Cardiac arrest resulting in development of cardiogenic shock -V. fib arrest associated with STEMI, patient shocked 5 times with 15 minutes of CPR prior to ROSC Status post STEMI post LAD stent, cardiogenic shock -Emergent cath completed 5/21 occluded proximal LAD status post DES.  Per heart failure  DES embolized and again LAD is completely occluded Cardiomyopathy EF 30-35 -Echo 5/21 with EF 30 to 35%, coarse apical trabeculation and calcified LV apical false tendon, LV with regional wall motion abnormality and grade 1 diastolic dysfunction New onset atrial fibrillation- currently NSR w/PACs Acute respiratory failure secondary to cardiac arrest, MI Right-sided aspiration pneumonia- treated with zosyn and Vanc, WBC  17.5 to 21.8 Pulmonary edema Acute encephalopathy, metabolic vs. Anoxic- EEG and initial head CT neg; apparently was following commands in native language on 5/23 Protein calorie malnutrition- present on admission Recurrent fever- question inadequately treated HCAP, Culture negative as of 5/28. ?poor pulmonary hygiene with atelectasis.  TMAX 101.4  -Continue Ceftazidime and Vancomycin. If fever greater than 101.5 will consider obtaining cultures. -Will look today at RT effusion with ultrasound and consider therapeutic tap -Pulmonary hygiene with IS and flutter valve. OOB with PT/OT. -Continue scheduled oxy 10 for post chest compression pain.  -Continue decadron 4mg  q12h, last dose today.  -Continue Duo-nebs and guaifenesin -Continue DAPT -Continue Amiodarone. HF starting Digoxin and weaning midodrine 6/1 appreciate HF team Inotropes and diuresis per HF -AM CXR  Best practice   Diet:  Tube Feed , TF at goal  Pain/Anxiety/Delirium protocol (if indicated): Wean VAP protocol (if indicated): Not indicated DVT prophylaxis: LMWH and SCD GI prophylaxis: H2B Glucose control:  SSI moderate Central venous access:  Yes, and it is still needed Arterial line:  off Foley:  Yes, and it is no longer needed Mobility:  OOB  PT consulted: Yes Last date of multidisciplinary goals of care discussion: called daughter, no answer; hopefully they can come in today Code Status:  full code Disposition: ICU, remains on milrinone     Critical Care Time: NA  ., MSN, APRN, AGACNP-BC [facility]  08/01/2020 , 9:26 AM  Please see [email or web address] for pager details  If no response, please call [phone] After hours, please call Elink at [phone]

## 2020-08-01 NOTE — Progress Notes (Signed)
Nutrition Follow Up  DOCUMENTATION CODES:   Severe malnutrition in context of chronic illness  INTERVENTION:   Transition tube feeding to standard formula:  -Osmolite 1.5 @ 45 ml/hr via Cortrak (1080 ml) -ProSource TF 45 ml BID  Provides: 1700 kcals, 90 grams protein, 823 ml free water.   NUTRITION DIAGNOSIS:   Severe Malnutrition related to chronic illness as evidenced by severe fat depletion,severe muscle depletion.  Ongoing  GOAL:   Patient will meet greater than or equal to 90% of their needs   Met with TF  MONITOR:   Vent status,TF tolerance,Weight trends,Skin,Labs  REASON FOR ASSESSMENT:   Ventilator    ASSESSMENT:   Patient without PMH on file. Presents this admission after v.fib arrest associated with STEMI.   5/21- emergent cath, occluded proximal LAD status post DES complicated by embolization to distal LAD 5/31- extubated   Pt discussed during ICU rounds and with RN.   Febrile. Off pressors. Following commands. Slightly hypernatremic, may need to add free water if trends continue. Failed swallow evaluation this am. Per SLP, patient had difficult eating PTA due to poor dentition, which has impacted his nutrition status greatly. Family was trying to get him fitted for dentures, but could not get him in PTA. Transition tube feeding to standard formula. Follow for diet progression per SLP.   Admission weight: 55 kg  Current weight: 48.2 kg  UOP: 4365 ml x 24 hrs   Drips: precedex Medications: decadron, colace, SS novolog, miralax Labs: Na 146 (H) CBG 175-262  Diet Order:   Diet Order            Diet NPO time specified  Diet effective now                 EDUCATION NEEDS:   Not appropriate for education at this time  Skin:  Skin Assessment: Reviewed RN Assessment  Last BM:  5/31  Height:   Ht Readings from Last 1 Encounters:  07/30/20 5' 4" (1.626 m)    Weight:   Wt Readings from Last 1 Encounters:  08/01/20 48.2 kg    BMI:   Body mass index is 18.24 kg/m.  Estimated Nutritional Needs:   Kcal:  1500-1700 kcal  Protein:  80-105 grams  Fluid:  >/= 1.5 L/day    RD, LDN Clinical Nutrition Pager listed in AMION  

## 2020-08-02 ENCOUNTER — Inpatient Hospital Stay (HOSPITAL_COMMUNITY): Payer: Medicare Other

## 2020-08-02 DIAGNOSIS — I2102 ST elevation (STEMI) myocardial infarction involving left anterior descending coronary artery: Secondary | ICD-10-CM | POA: Diagnosis not present

## 2020-08-02 DIAGNOSIS — I469 Cardiac arrest, cause unspecified: Secondary | ICD-10-CM | POA: Diagnosis not present

## 2020-08-02 LAB — BASIC METABOLIC PANEL
Anion gap: 8 (ref 5–15)
BUN: 31 mg/dL — ABNORMAL HIGH (ref 8–23)
CO2: 25 mmol/L (ref 22–32)
Calcium: 8.2 mg/dL — ABNORMAL LOW (ref 8.9–10.3)
Chloride: 118 mmol/L — ABNORMAL HIGH (ref 98–111)
Creatinine, Ser: 0.88 mg/dL (ref 0.61–1.24)
GFR, Estimated: >60 mL/min (ref >60)
Glucose, Bld: 253 mg/dL — ABNORMAL HIGH (ref 70–99)
Potassium: 3.7 mmol/L (ref 3.5–5.1)
Sodium: 151 mmol/L — ABNORMAL HIGH (ref 135–145)

## 2020-08-02 LAB — CBC
HCT: 29.5 % — ABNORMAL LOW (ref 39.0–52.0)
Hemoglobin: 9 g/dL — ABNORMAL LOW (ref 13.0–17.0)
MCH: 30 pg (ref 26.0–34.0)
MCHC: 30.5 g/dL (ref 30.0–36.0)
MCV: 98.3 fL (ref 80.0–100.0)
Platelets: 428 10*3/uL — ABNORMAL HIGH (ref 150–400)
RBC: 3 MIL/uL — ABNORMAL LOW (ref 4.22–5.81)
RDW: 15.9 % — ABNORMAL HIGH (ref 11.5–15.5)
WBC: 19.8 10*3/uL — ABNORMAL HIGH (ref 4.0–10.5)
nRBC: 0 % (ref 0.0–0.2)

## 2020-08-02 LAB — PROTIME-INR
INR: 1.2 (ref 0.8–1.2)
Prothrombin Time: 15 seconds (ref 11.4–15.2)

## 2020-08-02 LAB — COOXEMETRY PANEL
Carboxyhemoglobin: 1.3 % (ref 0.5–1.5)
Methemoglobin: 1 % (ref 0.0–1.5)
O2 Saturation: 56.2 %
Total hemoglobin: 15.5 g/dL (ref 12.0–16.0)

## 2020-08-02 LAB — GLUCOSE, CAPILLARY
Glucose-Capillary: 193 mg/dL — ABNORMAL HIGH (ref 70–99)
Glucose-Capillary: 196 mg/dL — ABNORMAL HIGH (ref 70–99)
Glucose-Capillary: 203 mg/dL — ABNORMAL HIGH (ref 70–99)
Glucose-Capillary: 238 mg/dL — ABNORMAL HIGH (ref 70–99)
Glucose-Capillary: 239 mg/dL — ABNORMAL HIGH (ref 70–99)
Glucose-Capillary: 243 mg/dL — ABNORMAL HIGH (ref 70–99)

## 2020-08-02 MED ORDER — POTASSIUM CHLORIDE 20 MEQ PO PACK
40.0000 meq | PACK | Freq: Once | ORAL | Status: AC
  Administered 2020-08-02: 40 meq
  Filled 2020-08-02: qty 2

## 2020-08-02 MED ORDER — IPRATROPIUM-ALBUTEROL 0.5-2.5 (3) MG/3ML IN SOLN
3.0000 mL | Freq: Three times a day (TID) | RESPIRATORY_TRACT | Status: DC
  Administered 2020-08-02 – 2020-08-04 (×4): 3 mL via RESPIRATORY_TRACT
  Filled 2020-08-02 (×4): qty 3

## 2020-08-02 MED ORDER — FREE WATER
100.0000 mL | Status: DC
  Administered 2020-08-02 – 2020-08-10 (×44): 100 mL

## 2020-08-02 MED FILL — Medication: Qty: 1 | Status: AC

## 2020-08-02 NOTE — Progress Notes (Signed)
Potassium 3.7, see orders.

## 2020-08-02 NOTE — Progress Notes (Signed)
Inpatient Diabetes Program Recommendations  AACE/ADA: New Consensus Statement on Inpatient Glycemic Control (2015)  Target Ranges:  Prepandial:   less than 140 mg/dL      Peak postprandial:   less than 180 mg/dL (1-2 hours)      Critically ill patients:  140 - 180 mg/dL   Lab Results  Component Value Date   GLUCAP 243 (H) 08/02/2020   HGBA1C 6.5 (H) 07/21/2020    Review of Glycemic Control Results for Vincent Horton, Vincent Horton (MRN [id number]) as of 08/02/2020 11:21  Ref. Range 08/01/2020 11:17 08/01/2020 16:19 08/01/2020 20:11 08/01/2020 23:47 08/02/2020 04:26  Glucose-Capillary Latest Ref Range: 70 - 99 mg/dL 315 (H) 400 (H) 867 (H) 203 (H) 243 (H)   Diabetes history: None (A1C=6.5%? New) Outpatient Diabetes medications:  None Current orders for Inpatient glycemic control:  Novolog 0-15 units q 4 hours Osmolite 45 ml/hr Novolog 3 units q 4 hours Inpatient Diabetes Program Recommendations:    Last dose of Decadron was last night.  Anticipate that blood sugars will improve.  Will follow.   Thanks, Beryl Meager, RN, BC-ADM Inpatient Diabetes Coordinator Pager [phone] (8a-5p)

## 2020-08-02 NOTE — Progress Notes (Signed)
   08/02/20 1420  Assess: MEWS Score  Temp 98.4 F (36.9 C)  BP 96/63  Pulse Rate (!) 105  ECG Heart Rate (!) 105  Resp (!) 28  Level of Consciousness Responds to Voice  SpO2 99 %  O2 Device Room Air  Assess: MEWS Score  MEWS Temp 0  MEWS Systolic 1  MEWS Pulse 1  MEWS RR 2  MEWS LOC 1  MEWS Score 5  MEWS Score Color Red  Assess: if the MEWS score is Yellow or Red  Were vital signs taken at a resting state? Yes  Focused Assessment No change from prior assessment  Early Detection of Sepsis Score *See Row Information* Medium  MEWS guidelines implemented *See Row Information* Yes  Treat  MEWS Interventions Escalated (See documentation below)  Pain Scale Faces  Faces Pain Scale 0  Escalate  MEWS: Escalate Red: discuss with charge nurse/RN and provider, consider discussing with RRT  Notify: Charge Nurse/RN  Name of Charge Nurse/RN Notified Shanda Bumps RN  Date Charge Nurse/RN Notified 08/02/20  Time Charge Nurse/RN Notified 1420  Pt just arrived from [facility]. No change noted from previous documentation. Will continue to monitor.

## 2020-08-02 NOTE — Progress Notes (Addendum)
Patient ID: Vincent Horton, male   DOB: 06-19-1955, 65 y.o.   MRN: [id number] P    Advanced Heart Failure Rounding Note  PCP-Cardiologist: None   Subjective:    Extubated 5/31 Thoracentesis 6/1 Rt Pleural effusion, 450 cc removed   CXR today w/o effusion. Currently getting chest PT   Off pressors, remains on milrinone 0.125.  Co-ox 56%. CVP not checked as pt currently getting chest CT. Wt down 4 lb   No events overnight    Echo: EF 30-35%, no LV thrombus, LAD territory WMAs, RV normal, IVC normal.    Objective:   Weight Range: 46.4 kg Body mass index is 17.56 kg/m.   Vital Signs:   Temp:  [98.3 F (36.8 C)-99.7 F (37.6 C)] 98.3 F (36.8 C) (06/02 0700) Pulse Rate:  [63-120] 103 (06/02 0700) Resp:  [15-40] 25 (06/02 0700) BP: (90-120)/(54-94) 114/94 (06/02 0700) SpO2:  [56 %-100 %] 96 % (06/02 0700) Weight:  [46.4 kg] 46.4 kg (06/02 0309) Last BM Date: 07/31/20  Weight change: Filed Weights   07/31/20 0210 08/01/20 0235 08/02/20 0309  Weight: 50.8 kg 48.2 kg 46.4 kg    Intake/Output:   Intake/Output Summary (Last 24 hours) at 08/02/2020 0813 Last data filed at 08/02/2020 0700 Gross per 24 hour  Intake 1332.23 ml  Output 3225 ml  Net -1892.77 ml      Physical Exam   General:  thin appearing male. No respiratory difficulty HEENT: normal + cortrac  Neck: supple. no JVD. Carotids 2+ bilat; no bruits. No lymphadenopathy or thyromegaly appreciated. Cor: PMI nondisplaced. Regular rhythm, mildly tachy rate. No rubs, gallops or murmurs. Lungs: clear Abdomen: soft, nontender, nondistended. No hepatosplenomegaly. No bruits or masses. Good bowel sounds. Extremities: no cyanosis, clubbing, rash, edema Neuro: alert & oriented x 3, cranial nerves grossly intact. moves all 4 extremities w/o difficulty. Affect pleasant.    Telemetry   Sinus tach low 100s. Personally reviewed  Labs    CBC Recent Labs    08/01/20 0343 08/02/20 0354  WBC 21.8* 19.8*  HGB 9.4* 9.0*   HCT 30.3* 29.5*  MCV 96.5 98.3  PLT 408* 428*   Basic Metabolic Panel Recent Labs    05/39/76 0343 08/02/20 0354  NA 146* 151*  K 3.9 3.7  CL 109 118*  CO2 27 25  GLUCOSE 254* 253*  BUN 30* 31*  CREATININE 1.00 0.88  CALCIUM 8.4* 8.2*  MG 2.4  --    Liver Function Tests Recent Labs    07/31/20 0334 08/01/20 0343 08/01/20 1440  AST 55* 61*  --   ALT 82* 81*  --   ALKPHOS 97 104  --   BILITOT 0.6 0.8  --   PROT 5.8* 6.1* 6.0*  ALBUMIN 1.9* 2.0*  --    No results for input(s): LIPASE, AMYLASE in the last 72 hours. Cardiac Enzymes No results for input(s): CKTOTAL, CKMB, CKMBINDEX, TROPONINI in the last 72 hours.  BNP: BNP (last 3 results) No results for input(s): BNP in the last 8760 hours.  ProBNP (last 3 results) No results for input(s): PROBNP in the last 8760 hours.   D-Dimer No results for input(s): DDIMER in the last 72 hours. Hemoglobin A1C No results for input(s): HGBA1C in the last 72 hours. Fasting Lipid Panel No results for input(s): CHOL, HDL, LDLCALC, TRIG, CHOLHDL, LDLDIRECT in the last 72 hours. Thyroid Function Tests No results for input(s): TSH, T4TOTAL, T3FREE, THYROIDAB in the last 72 hours.  Invalid input(s): FREET3  Other results:  Imaging    DG Chest Port 1 View  Result Date: 08/02/2020 CLINICAL DATA:  65 year old male with respiratory failure. Hypoxia. STEMI. Found down. EXAM: PORTABLE CHEST 1 VIEW COMPARISON:  Portable chest 08/01/2020 and earlier. FINDINGS: Portable AP supine view at 0426 hours. Patient more rotated to the left today. Stable visible enteric feeding tube, looped once in the proximal stomach. Stable right IJ central line. Stable cardiac size and mediastinal contours. No pneumothorax. No pleural effusion is evident. Continued widespread increased interstitial opacity, with additional patchy and platelike opacity at the lung bases. Ventilation has improved since 07/31/2020, stable since yesterday. IMPRESSION: 1. Stable  lines and tubes. 2. Stable ventilation from yesterday with widespread interstitial opacity, improved since 07/31/2020, and bibasilar atelectasis. Electronically Signed   By: Odessa Fleming M.D.   On: 08/02/2020 06:41   DG CHEST PORT 1 VIEW  Result Date: 08/01/2020 CLINICAL DATA:  Status post thoracentesis. EXAM: PORTABLE CHEST 1 VIEW COMPARISON:  Single-view of the chest earlier today. FINDINGS: Hazy opacity over the right chest is decreased after thoracentesis. No pneumothorax. Streaky basilar airspace opacities are again seen. Right IJ catheter is unchanged. The patient's feeding tube tip has advanced and is now below the inferior margin the film. IMPRESSION: Decreased right effusion after thoracentesis. Negative for pneumothorax. Bibasilar airspace disease could be due to atelectasis or pneumonia. Feeding tube has advanced with the tip now below the inferior margin of the film. Electronically Signed   By: Drusilla Kanner M.D.   On: 08/01/2020 15:01     Medications:     Scheduled Medications: . amiodarone  200 mg Per Tube Daily  . aspirin  81 mg Per Tube Daily  . chlorhexidine  15 mL Mouth Rinse BID  . Chlorhexidine Gluconate Cloth  6 each Topical Daily  . clonazepam  0.5 mg Per Tube TID  . digoxin  0.125 mg Per Tube Daily  . docusate  100 mg Per Tube BID  . doxazosin  2 mg Per Tube Daily  . enoxaparin (LOVENOX) injection  30 mg Subcutaneous Daily  . famotidine  20 mg Per Tube BID  . feeding supplement (PROSource TF)  45 mL Per Tube BID  . insulin aspart  0-15 Units Subcutaneous Q4H  . insulin aspart  3 Units Subcutaneous Q4H  . ipratropium-albuterol  3 mL Nebulization QID  . mouth rinse  15 mL Mouth Rinse q12n4p  . midodrine  10 mg Per Tube TID WC  . nicotine  14 mg Transdermal Daily  . oxyCODONE  5 mg Per Tube Q8H  . polyethylene glycol  17 g Per Tube Daily  . rosuvastatin  40 mg Per Tube Daily  . sodium chloride flush  10-40 mL Intracatheter Q12H  . sodium chloride flush  3 mL  Intravenous Q12H  . ticagrelor  90 mg Per Tube BID    Infusions: . sodium chloride Stopped (07/31/20 1941)  . sodium chloride    . cefTAZidime (FORTAZ)  IV Stopped (08/01/20 2318)  . dexmedetomidine (PRECEDEX) IV infusion 0.4 mcg/kg/hr (08/02/20 0207)  . feeding supplement (OSMOLITE 1.5 CAL) 1,000 mL (08/01/20 1214)  . milrinone 0.125 mcg/kg/min (08/02/20 0700)  . vancomycin Stopped (08/01/20 0942)    PRN Medications: Place/Maintain arterial line **AND** sodium chloride, acetaminophen (TYLENOL) oral liquid 160 mg/5 mL, guaiFENesin-dextromethorphan, haloperidol lactate, ondansetron (ZOFRAN) IV, sodium chloride flush, sodium chloride flush    Assessment/Plan   1. CAD: Anterior STEMI with occluded proximal LAD.  Treated with DES but complicated by embolization to distal LAD (procedure  completed with occluded distal LAD).  No chest pain.  - Continue ticagrelor and ASA 81.  - Crestor 40 daily. 2. Cardiogenic shock: Ischemic cardiomyopathy. Echo with EF 30-35%, no LV thrombus, LAD territory WMAs, RV normal, IVC normal. Now off pressors, remains on milrinone 0.125.  Co-ox 56%. Volume status ok  - Continue milrinone 0.125 today.  - Continue midodrine at 10 tid  - Hold lasix again today  - Continue digoxin 0.125.  3. Acute hypoxemic respiratory failure: Now extubated.  Suspect HCAP. WBCs downtrending, now afebrile.  Finished Zosyn 5/29, started vancomycin/ceftazidime 5/31.  Cultures NGTD.  4. Cardiac arrest: VF arrest associated with STEMI. Patient shocked x 5, 15 min CPR before ROSC.  No further VT.  Now rewarmed s/p hypothermia protocol.  He has been revascularized.  - Will need Lifevest at discharge to assess for recovery of EF.  5. Anemia: Transfused 1 U on 5/27, Hgb 9.0 today, stable  6. Paroxysmal Atrial fibrillation: We have not seen any actual atrial fibrillation, has had ST with PACs.  Currently in ST 100s - Lovenox prophylaxis - Continue amiodarone to 200 mg daily.  7. Rt Pleural  Effusion: s/p thoracentesis 6/1 w/ 450 cc fluid removal. F/u CXR 6/2 w/o effusion.  8. Delirium 9. Hypernatremia: Na 151.   - CCM increasing free water boluses.   Needs PT/OT.   D/w PCCM. Plan transfer out to [facility] today   Robbie Lis, PA-C  08/02/2020 8:13 AM  Patient seen with PA, agree with the above note.    Confused today, but communication is difficult with language barrier.  He is on milrinone 0.125 with co-ox 56%.  CVP 7 on my read.    He remains on ceftazidime/vancomycin for HCAP, afebrile.   General: Awake, NAD Neck: No JVD, no thyromegaly or thyroid nodule.  Lungs: Crackles dependently.  CV: Nondisplaced PMI.  Heart regular S1/S2, no S3/S4, no murmur.  No peripheral edema.  No carotid bruit.  Normal pedal pulses.  Abdomen: Soft, nontender, no hepatosplenomegaly, no distention.  Skin: Intact without lesions or rashes.  Neurologic: Alert, confused.  Extremities: No clubbing or cyanosis.  HEENT: Normal.   I will continue milrinone at 0.125 today, stop tomorrow if co-ox stable.  Continue digoxin.  Decrease midodrine to 5 mg tid. Would hold off on Lasix today with CVP 7.   Continue DAPT and statin with recent MI.    Na 151, free water increased by CCM per nursing.   On ceftazidime/vancomycin for HCAP per CCM.   Marca Ancona 08/02/2020 9:45 AM

## 2020-08-02 NOTE — Progress Notes (Signed)
Made contact with Boyce Medici PA with Heart Failure team to clarify CVP orders.  Made request to discontinue foley and was asked to leave at this time related to just being replaced for urinary retention.  Made request for CVC discontinue and replace with  PICC.  CVC dressing was open when arrived to the department and with patient constant agitation he is high risk for CAUTI and CLABSI pulling at both of these devices.  Keeping PICC covered would be much easier.  Was asked to keep CVC at this time with evaluation of continuing milrinone vs discontinue with no long term need for line    Jacqulyn Cane RN, BSN, CCRN-K

## 2020-08-02 NOTE — Progress Notes (Signed)
  Speech Language Pathology Treatment: Dysphagia  Patient Details Name: Vincent Horton MRN: [id number] DOB: September 06, 1955 Today's Date: 08/02/2020 Time: 2376-2831 SLP Time Calculation (min) (ACUTE ONLY): 25 min  Assessment / Plan / Recommendation Clinical Impression  Pt was seen with only audio interpreter available today (#[id number]). He does follow some one-step commands, but inconsistently and demonstrating a lot of distractibility. Pt had a lot of thick secretions dried to his hard and soft palate, for which SLP provided thorough oral care with suction. Ice chips and thin liquids were used as diagnostic trials and to provide moisture to try to alleviate secretions. A lot were removed, but staff also notified about need for ongoing oral care to continue to improve oral cavity. Pt has oral holding with PO trials, anterior loss of ice chips, and limited swallow initiation today with resultant coughing and wet vocal quality. Continue to recommend NPO with use of temporary, alternative means of nutrition. He will likely need additional time post-extubation as he is still mostly aphonic, with improved oral hygiene also needed to try to swallow more adequately.    HPI HPI: Pt is a 65 yo male found down by family VF arrest associated with STEMI, shocked 5x, 15 min CPR before ROSC achieved. ETT 5/21-5/31. CXR 5/31 with persistent bibasilar infiltrates/edema. PMH includes cardiac arrest, CAD, dysrhythmia, myocardial infarction. Per pt's son, pt's oral intake at home was limited to very soft and/or liquidised foods PTA due to condition of dentition.      SLP Plan  Continue with current plan of care       Recommendations  Diet recommendations: NPO Medication Administration: Via alternative means                Oral Care Recommendations: Oral care QID Follow up Recommendations: Inpatient Rehab SLP Visit Diagnosis: Dysphagia, unspecified (R13.10) Plan: Continue with current plan of care       GO                 Mahala Menghini., M.A. CCC-SLP Acute Rehabilitation Services Pager [phone] Office [phone]  08/02/2020, 12:01 PM

## 2020-08-02 NOTE — Progress Notes (Signed)
NAMEIsrrael Horton, MRN:  [id number], DOB:  03/30/1955, LOS: 12 ADMISSION DATE:  07/21/2020, CONSULTATION DATE:  07/22/2019 REFERRING MD:  [place], CHIEF COMPLAINT:  Post arrest   History of Present Illness:  Patient is a 65 year old male with limited medical history found down by family, EMS was called, he was found to be in V. fib shocked 5 x 3 mg epinephrine 450 mg of amiodarone with ROSC obtained.  He was emergently taken to the Cath Lab showing ST segment changes in anterior lateral leads status post revascularization of LAD.  He was noted to have been moving spontaneously at time of cardiac catheterization.  Transferred to the ICU.  PCCM consulted for help with management  Pertinent  Medical History    has a past medical history of Cardiac arrest [facility]), Coronary artery disease, Dysrhythmia, and Myocardial infarction (HCC).  Significant Hospital Events: Including procedures, antibiotic start and stop dates in addition to other pertinent events    5/21 Vfib arrest, Cardiac catheterization, TTM started, right IJ placed  5/22 remains on NE/ milrinone, sedated on fentanyl versed   5/23 aline placed, concern for afib controlled on IV amio, f/c this am with family  5/24 ongoing agitation, pressor requirements  5/25  Issues with sedation overnight, Remains on significant vasopressor support  5/26 Remains on significant vasopressor support, started on midodrine  5/29 remains on NEPI, Vaso, Milirinone   5/31 Extubated   5/21 MRSA neg 5/22 BCx2 >> neg 5/26 trach asp >> ngtd  5/21 zosyn >> (last dose 5/27)  Interim History / Subjective:  Tmax 99.7  1.4 L uop past 24, -469 past 24, -2.9 L admission  On exam milrinone at 0.125  Subjective exam limited due to patient not speaking  Objective   Blood pressure (!) 114/94, pulse (!) 103, temperature 98.3 F (36.8 C), temperature source Axillary, resp. rate (!) 25, height 5\' 4"  (1.626 m), weight 46.4 kg, SpO2 96 %. on 2L [place] CVP:  [0  mmHg-11 mmHg] 0 mmHg      Intake/Output Summary (Last 24 hours) at 08/02/2020 0813 Last data filed at 08/02/2020 0700 Gross per 24 hour  Intake 1332.23 ml  Output 3225 ml  Net -1892.77 ml   Filed Weights   07/31/20 0210 08/01/20 0235 08/02/20 0309  Weight: 50.8 kg 48.2 kg 46.4 kg    Examination:  General:  Frail, in bed, no acute distress HEENT: MM pink/moist, icteric/anicteric, trachea midline, rt IJ cvc  Neuro: MAE, F/C, RASS 0 CV: S1S2, ST on monitor, no m/r/g appreciated PULM: clear in the upper lobes, clear in the lower lobes, scant secretions, chest expansion symmetric GI: soft, bsx4 active, nondistended   Extremities: warm/dry, no pretibial edema, capillary refill greater/less than 3 seconds  Renal: foley in place, yellow urine, clear Skin: no rashes or lesions   Labs/imaging that I have personally reviewed   CXR- RLL effusion improved post thoracentesis BMP- Hypernatremia CBC- WBC 21.8>19.8  Resolved Hospital Problem list   Acute respiratory failure secondary to cardiac arrest, MI Recurrent fever Assessment & Plan:   Right-sided aspiration pneumonia- treated with zosyn and Vanc, WBC 17.5 to 21.8 to 17.4, TMAX 99.5. RT effusion improved on cxr improved post thora -Continue Ceftazidime and Vancomycin. Continue to follow cultures.TA -, BC NGTD -Body fluid from thoracentesis most likely exudate. Suspect from small bleed post CPR -Continue pulmonary hygrine with oob, flutter valve, IS -Decadron complete -Continue Duo-nebs and guaifensin.  Cardiac arrest resulting in development of cardiogenic shock -V. fib arrest associated  with STEMI, patient shocked 5 times with 15 minutes of CPR prior to ROSC Status post STEMI post LAD stent, cardiogenic shock -Emergent cath completed 5/21 occluded proximal LAD status post DES.  Per heart failure DES embolized and again LAD is completely occluded Cardiomyopathy EF 30-35 -Echo 5/21 with EF 30 to 35%, coarse apical trabeculation and  calcified LV apical false tendon, LV with regional wall motion abnormality and grade 1 diastolic dysfunction New onset atrial fibrillation- remains in NSR Pulmonary edema Acute encephalopathy, metabolic vs. Anoxic- EEG and initial head CT neg; apparently was following commands in native language on 5/23 Protein calorie malnutrition- present on admission COOX 56.7 -Appreciate Heart Failure assistance -Continue Amiodarone 200mg  daily -HF continuing digoxin -On milrinone 0.125 -HF decreasing midodrine to 5mg  TID -Continue DAPT and statin. Ticagrelor, ASA 81, Crestor 40 daily  Hypernatremia NA 146>151 - start q4h free water per tube -AM BMP to monitor NA  Urine retention Foley pulled and replaced on 6/1 -Continue cardura  Plan to transfer to [place] Best practice   Diet:  Tube Feed , TF at goal  Pain/Anxiety/Delirium protocol (if indicated): Wean VAP protocol (if indicated): Not indicated DVT prophylaxis: LMWH and SCD GI prophylaxis: H2B Glucose control:  SSI moderate Central venous access:  Yes, and it is still needed Arterial line:  off Foley:  Yes, and it is no longer needed Mobility:  OOB  PT consulted: Yes Last date of multidisciplinary goals of care discussion: (pending) Code Status:  full code Disposition: [place]   Critical Care Time: N/A  ., MSN, APRN, AGACNP-BC [facility]  08/02/2020 , 8:13 AM  Please see [email or web address] for pager details  If no response, please call [phone] After hours, please call Elink at [phone]

## 2020-08-03 DIAGNOSIS — I5021 Acute systolic (congestive) heart failure: Secondary | ICD-10-CM | POA: Diagnosis not present

## 2020-08-03 LAB — GLUCOSE, CAPILLARY
Glucose-Capillary: 153 mg/dL — ABNORMAL HIGH (ref 70–99)
Glucose-Capillary: 180 mg/dL — ABNORMAL HIGH (ref 70–99)
Glucose-Capillary: 181 mg/dL — ABNORMAL HIGH (ref 70–99)
Glucose-Capillary: 184 mg/dL — ABNORMAL HIGH (ref 70–99)
Glucose-Capillary: 190 mg/dL — ABNORMAL HIGH (ref 70–99)
Glucose-Capillary: 217 mg/dL — ABNORMAL HIGH (ref 70–99)
Glucose-Capillary: 229 mg/dL — ABNORMAL HIGH (ref 70–99)

## 2020-08-03 LAB — BASIC METABOLIC PANEL
Anion gap: 7 (ref 5–15)
BUN: 29 mg/dL — ABNORMAL HIGH (ref 8–23)
CO2: 22 mmol/L (ref 22–32)
Calcium: 8.1 mg/dL — ABNORMAL LOW (ref 8.9–10.3)
Chloride: 121 mmol/L — ABNORMAL HIGH (ref 98–111)
Creatinine, Ser: 0.95 mg/dL (ref 0.61–1.24)
GFR, Estimated: >60 mL/min (ref >60)
Glucose, Bld: 177 mg/dL — ABNORMAL HIGH (ref 70–99)
Potassium: 3.9 mmol/L (ref 3.5–5.1)
Sodium: 150 mmol/L — ABNORMAL HIGH (ref 135–145)

## 2020-08-03 LAB — CBC
HCT: 29.8 % — ABNORMAL LOW (ref 39.0–52.0)
Hemoglobin: 9.4 g/dL — ABNORMAL LOW (ref 13.0–17.0)
MCH: 29.9 pg (ref 26.0–34.0)
MCHC: 31.5 g/dL (ref 30.0–36.0)
MCV: 94.9 fL (ref 80.0–100.0)
Platelets: 448 10*3/uL — ABNORMAL HIGH (ref 150–400)
RBC: 3.14 MIL/uL — ABNORMAL LOW (ref 4.22–5.81)
RDW: 15.9 % — ABNORMAL HIGH (ref 11.5–15.5)
WBC: 15.3 10*3/uL — ABNORMAL HIGH (ref 4.0–10.5)
nRBC: 0 % (ref 0.0–0.2)

## 2020-08-03 LAB — PROTIME-INR
INR: 1.2 (ref 0.8–1.2)
Prothrombin Time: 15.2 seconds (ref 11.4–15.2)

## 2020-08-03 LAB — PATHOLOGIST SMEAR REVIEW

## 2020-08-03 LAB — COOXEMETRY PANEL
Carboxyhemoglobin: 1.4 % (ref 0.5–1.5)
Methemoglobin: 1.2 % (ref 0.0–1.5)
O2 Saturation: 65.7 %
Total hemoglobin: 9.9 g/dL — ABNORMAL LOW (ref 12.0–16.0)

## 2020-08-03 LAB — CYTOLOGY - NON PAP

## 2020-08-03 LAB — VANCOMYCIN, TROUGH: Vancomycin Tr: 5 ug/mL — ABNORMAL LOW (ref 15–20)

## 2020-08-03 MED ORDER — VANCOMYCIN HCL 750 MG/150ML IV SOLN
750.0000 mg | Freq: Two times a day (BID) | INTRAVENOUS | Status: AC
  Administered 2020-08-03 – 2020-08-06 (×7): 750 mg via INTRAVENOUS
  Filled 2020-08-03 (×7): qty 150

## 2020-08-03 MED ORDER — MIDODRINE HCL 5 MG PO TABS
5.0000 mg | ORAL_TABLET | Freq: Three times a day (TID) | ORAL | Status: DC
  Administered 2020-08-03 – 2020-08-06 (×9): 5 mg
  Filled 2020-08-03 (×9): qty 1

## 2020-08-03 MED ORDER — SODIUM CHLORIDE 0.9 % IV SOLN
2.0000 g | Freq: Three times a day (TID) | INTRAVENOUS | Status: AC
  Administered 2020-08-03 – 2020-08-06 (×10): 2 g via INTRAVENOUS
  Filled 2020-08-03 (×12): qty 2

## 2020-08-03 NOTE — Progress Notes (Signed)
Inpatient Rehab Admissions Coordinator:   Micah Flesher to see pt to discuss CIR.  Sleeping soundly with sitter at bedside who reports he was very agitated this morning and had been sleeping for a few hours.  No family at bedside, but per report, they have been for several days.  We will not have a bed available for this patient today or through the weekend, so I will f/u with pt next week to discuss CIR, if he remains in house.    Estill Dooms, PT, DPT Admissions Coordinator [phone] 08/03/20  2:45 PM

## 2020-08-03 NOTE — Progress Notes (Addendum)
Patient ID: Vincent Horton, male   DOB: Jun 18, 1955, 65 y.o.   MRN: [id number] P    Advanced Heart Failure Rounding Note  PCP-Cardiologist: None   Subjective:    Extubated 5/31 Thoracentesis 6/1 Rt Pleural effusion, 450 cc removed   Yesterday milrinone cut back to 0.125 mcg. CO-OX 66%  Complaining of sore throat. Has sitter at bedside due to agitation.    Objective:   Weight Range: 49.3 kg Body mass index is 18.66 kg/m.   Vital Signs:   Temp:  [98.3 F (36.8 C)-100 F (37.8 C)] 100 F (37.8 C) (06/03 0757) Pulse Rate:  [95-120] 112 (06/03 0757) Resp:  [16-34] 25 (06/03 0757) BP: (94-158)/(59-138) 123/73 (06/03 0757) SpO2:  [96 %-100 %] 100 % (06/03 0757) Weight:  [49.3 kg] 49.3 kg (06/03 0613) Last BM Date: 08/02/20  Weight change: Filed Weights   08/01/20 0235 08/02/20 0309 08/03/20 4081  Weight: 48.2 kg 46.4 kg 49.3 kg    Intake/Output:   Intake/Output Summary (Last 24 hours) at 08/03/2020 0928 Last data filed at 08/03/2020 0518 Gross per 24 hour  Intake 616.74 ml  Output 1700 ml  Net -1083.26 ml      Physical Exam  CVP 2-3  General: Thin  HEENT: normal + Cortrak  Neck: supple. no JVD. Carotids 2+ bilat; no bruits. No lymphadenopathy or thryomegaly appreciated. Cor: PMI nondisplaced. Regular rate & rhythm. No rubs, gallops or murmurs. Lungs: Rhonchi throughout Abdomen: soft, nontender, nondistended. No hepatosplenomegaly. No bruits or masses. Good bowel sounds. Extremities: no cyanosis, clubbing, rash, edema Neuro: alert & orientedx3, cranial nerves grossly intact. moves all 4 extremities w/o difficulty. Affect pleasant    Telemetry   Sinus Tach 100-110s   Labs    CBC Recent Labs    08/02/20 0354 08/03/20 0400  WBC 19.8* 15.3*  HGB 9.0* 9.4*  HCT 29.5* 29.8*  MCV 98.3 94.9  PLT 428* 448*   Basic Metabolic Panel Recent Labs    44/81/85 0343 08/02/20 0354 08/03/20 0400  NA 146* 151* 150*  K 3.9 3.7 3.9  CL 109 118* 121*  CO2 27 25 22    GLUCOSE 254* 253* 177*  BUN 30* 31* 29*  CREATININE 1.00 0.88 0.95  CALCIUM 8.4* 8.2* 8.1*  MG 2.4  --   --    Liver Function Tests Recent Labs    08/01/20 0343 08/01/20 1440  AST 61*  --   ALT 81*  --   ALKPHOS 104  --   BILITOT 0.8  --   PROT 6.1* 6.0*  ALBUMIN 2.0*  --    No results for input(s): LIPASE, AMYLASE in the last 72 hours. Cardiac Enzymes No results for input(s): CKTOTAL, CKMB, CKMBINDEX, TROPONINI in the last 72 hours.  BNP: BNP (last 3 results) No results for input(s): BNP in the last 8760 hours.  ProBNP (last 3 results) No results for input(s): PROBNP in the last 8760 hours.   D-Dimer No results for input(s): DDIMER in the last 72 hours. Hemoglobin A1C No results for input(s): HGBA1C in the last 72 hours. Fasting Lipid Panel No results for input(s): CHOL, HDL, LDLCALC, TRIG, CHOLHDL, LDLDIRECT in the last 72 hours. Thyroid Function Tests No results for input(s): TSH, T4TOTAL, T3FREE, THYROIDAB in the last 72 hours.  Invalid input(s): FREET3  Other results:   Imaging    No results found.   Medications:     Scheduled Medications: . amiodarone  200 mg Per Tube Daily  . aspirin  81 mg Per Tube  Daily  . chlorhexidine  15 mL Mouth Rinse BID  . Chlorhexidine Gluconate Cloth  6 each Topical Daily  . clonazepam  0.5 mg Per Tube TID  . digoxin  0.125 mg Per Tube Daily  . docusate  100 mg Per Tube BID  . doxazosin  2 mg Per Tube Daily  . enoxaparin (LOVENOX) injection  30 mg Subcutaneous Daily  . famotidine  20 mg Per Tube BID  . feeding supplement (PROSource TF)  45 mL Per Tube BID  . free water  100 mL Per Tube Q4H  . insulin aspart  0-15 Units Subcutaneous Q4H  . insulin aspart  3 Units Subcutaneous Q4H  . ipratropium-albuterol  3 mL Nebulization TID  . mouth rinse  15 mL Mouth Rinse q12n4p  . midodrine  10 mg Per Tube TID WC  . nicotine  14 mg Transdermal Daily  . oxyCODONE  5 mg Per Tube Q8H  . polyethylene glycol  17 g Per Tube  Daily  . rosuvastatin  40 mg Per Tube Daily  . sodium chloride flush  10-40 mL Intracatheter Q12H  . sodium chloride flush  3 mL Intravenous Q12H  . ticagrelor  90 mg Per Tube BID    Infusions: . sodium chloride Stopped (07/31/20 1941)  . sodium chloride    . cefTAZidime (FORTAZ)  IV 2 g (08/02/20 2323)  . feeding supplement (OSMOLITE 1.5 CAL) 1,000 mL (08/02/20 1559)  . milrinone 0.125 mcg/kg/min (08/02/20 1556)  . vancomycin Stopped (08/02/20 1053)    PRN Medications: Place/Maintain arterial line **AND** sodium chloride, acetaminophen (TYLENOL) oral liquid 160 mg/5 mL, guaiFENesin-dextromethorphan, haloperidol lactate, ondansetron (ZOFRAN) IV, sodium chloride flush, sodium chloride flush    Assessment/Plan   1. CAD: Anterior STEMI with occluded proximal LAD.  Treated with DES but complicated by embolization to distal LAD (procedure completed with occluded distal LAD).   - Noo chest pain. .  - Continue ticagrelor and ASA 81.  - Crestor 40 daily. 2. Cardiogenic shock: Ischemic cardiomyopathy. Echo with EF 30-35%, no LV thrombus, LAD territory WMAs, RV normal, IVC normal.  Remains on milrinone 0.125.  Co-ox 66%. Stop milrinone.  - Cut midodrine to 5 mg tid  - CVP 2-3. Hold lasix again today  - Continue digoxin 0.125.  3. Acute hypoxemic respiratory failure: Now extubated.  Suspect HCAP. WBCs continues to come down. Finished Zosyn 5/29, started vancomycin/ceftazidime 5/31, continue 7 days total.  Cultures NGTD.  4. Cardiac arrest: VF arrest associated with STEMI. Patient shocked x 5, 15 min CPR before ROSC.  No further VT.  Now rewarmed s/p hypothermia protocol.  He has been revascularized.  - Will need Lifevest at discharge .   5. Anemia: Transfused 1 U on 5/27, Hgb 9.4  today, stable  6. Paroxysmal Atrial fibrillation: We have not seen any actual atrial fibrillation, has had ST with PACs.  Currently in ST 100-110s  - Lovenox prophylaxis - Continue amiodarone to 200 mg daily.   Can eventually stop this and replace with Coreg.  7. Rt Pleural Effusion: s/p thoracentesis 6/1 w/ 450 cc fluid removal. F/u CXR 6/2 w/o effusion.  8. Delirium 9. Hypernatremia: Na 150 Per primary team.   10. Dysphagia NPO. Speech following.   Tonye Becket, NP-C  08/03/2020 9:28 AM  Agree with the above NP note.   Stable today with good co-ox.  CVP remains low.  Still delirious.   Would stop milrinone today and agree with decreasing midodrine.  Continue digoxin.   Vancomycin/ceftaz x  7 days.   Marca Ancona 08/03/2020 2:39 PM

## 2020-08-03 NOTE — Progress Notes (Signed)
Pharmacy Antibiotic Note  Vincent Horton is a 65 y.o. male admitted on 07/21/2020 with cardiogenic shock and respiratory failure. Pharmacy has been consulted for vancomycin and ceftaizimd dosing for possible PNA. Pt previously received Zosyn x1 week for aspiration.   On day #4 of antibiotics - Scr stable at 0.95 (CrCl 53 mL/min). Afebrile. WBC trending down appropriately to 15. Vancomycin trough today was subtherapeutic at 5.   Plan: -Ceftazidime 2g IV q 8h -Vancomycin 750 mg IV q12  -Follow Cr, cultures, plan for 7 days total of therapy    Height: 5\' 4"  (162.6 cm) Weight: 49.3 kg (108 lb 11 oz) IBW/kg (Calculated) : 59.2  Temp (24hrs), Avg:98.8 F (37.1 C), Min:98.3 F (36.8 C), Max:100 F (37.8 C)  Recent Labs  Lab 07/28/20 0319 07/29/20 0510 07/30/20 0413 07/31/20 0334 08/01/20 0343 08/02/20 0354 08/03/20 0400  WBC 14.5*  --  17.5*  --  21.8* 19.8* 15.3*  CREATININE 1.42*   < > 1.11 1.09 1.00 0.88 0.95   < > = values in this interval not displayed.    Estimated Creatinine Clearance: 53.3 mL/min (by C-G formula based on SCr of 0.95 mg/dL).    No Known Allergies  Antimicrobials this admission: Zosyn 5/21 >> 5/29 Vancomycin 5/31 >>  Ceftazidime 5/31 >>  Microbiology results: 5/22 BCx: NGTD 5/26 TA: rare candida  Thank you for allowing pharmacy to be a part of this patient's care.  6/26, PharmD, BCCCP Clinical Pharmacist  Phone: [phone] 08/03/2020 9:40 AM  Please check AMION for all [facility] Pharmacy phone numbers After 10:00 PM, call Main Pharmacy [phone]

## 2020-08-03 NOTE — Care Management Important Message (Signed)
Important Message  Patient Details  Name: Vincent Horton MRN: [id number] Date of Birth: 1955/03/10   Medicare Important Message Given:  Yes     Renie Ora 08/03/2020, 10:11 AM

## 2020-08-03 NOTE — Progress Notes (Signed)
PROGRESS NOTE    Vincent Horton  FTD:[id number] DOB: [birth year] DOA: 07/21/2020 PCP: No primary care provider on file.   Chief Complaint  Patient presents with  . Cardiac Arrest    Brief Narrative:    65 year old male with limited medical history found down by family, EMS was called, he was found to be in V. fib shocked 5 x 3 mg epinephrine 450 mg of amiodarone with ROSC obtained.  He was emergently taken to the Cath Lab showing ST segment changes in anterior lateral leads status post revascularization of LAD.  He was noted to have been moving spontaneously at time of cardiac catheterization.  Transferred to the ICU.  PCCM consulted for help with management. He was extubated on 07/31/2020 and transferred to [facility] on 08/03/2020.  Patient remains on milrinone drip. Assessment & Plan:   Principal Problem:   Acute ST elevation myocardial infarction (STEMI) due to occlusion of left anterior descending (LAD) coronary artery (HCC) Active Problems:   Cardiac arrest (HCC)   STEMI involving left anterior descending coronary artery (HCC)   Protein calorie malnutrition (HCC)   Acute hypoxemic respiratory failure (HCC)   Ischemic cardiomyopathy   Aspiration pneumonia (HCC)   Pulmonary edema cardiac cause (HCC)   Acute kidney injury (HCC)   Shock liver   Cardiogenic shock (HCC)   Transaminitis   Protein-calorie malnutrition, severe   Pleural effusion   Cardiac arrest/cardiogenic shock V. fib arrest associated with STEMI , patient shocked 5 times within 15 minutes of CPR prior to ROSC S/p STEMI post LAD stent. Continue with aspirin and ticagrelor, Crestor.   Ischemic cardiomyopathy/cardiogenic shock Echocardiogram on 07/21/2020 showed a left ventricular ejection fraction of 30 to 35%, with grade 1 diastolic dysfunction, no LV thrombus. Heart failure team on board , patient remains on milrinone drip, continue with digoxin.    Acute respiratory failure with hypoxia probably secondary to healthcare  associated pneumonia Complete 7 days of IV antibiotics as per PCCM.   Paroxysmal atrial fibrillation Currently sinus tachycardia. Continue with digoxin, amiodarone.   Anemia of chronic disease S/p 1 unit of PRBC transfusion on 07/27/2020 Hemoglobin stable around 9, continue to monitor.    Hypernatremia Probably secondary to free water deficit, poor oral intake.. Free water infusion via core track.  Recheck sodium levels in the morning.   Leukocytosis probably from healthcare associated pneumonia.   Acute metabolic encephalopathy?  Secondary to cardiac arrest, hypernatremia Patient currently not responding or following commands. Initial CT of the head negative. EEG is negative for epileptiform activity.    Protein calorie malnutrition Dietary consulted and core track/tube feeds     DVT prophylaxis: (Lovenox) Code Status: Full code Family Communication: None at bedside Disposition:   Status is: Inpatient  Remains inpatient appropriate because:Ongoing diagnostic testing needed not appropriate for outpatient work up and IV treatments appropriate due to intensity of illness or inability to take PO   Dispo: The patient is from: Home              Anticipated d/c is to: Pending              Patient currently is not medically stable to d/c.   Difficult to place patient No       Consultants:   Cardiology  PCCM  Procedures:  Cardiac catheterization Intubated and extubated on 5/31.   antimicrobials:  Antibiotics Given (last 72 hours)    Date/Time Action Medication Dose Rate   07/31/20 2348 New Bag/Given   cefTAZidime (FORTAZ) 2  g in sodium chloride 0.9 % 100 mL IVPB 2 g 200 mL/hr   08/01/20 0841 New Bag/Given   vancomycin (VANCOREADY) IVPB 1000 mg/200 mL 1,000 mg 200 mL/hr   08/01/20 1027 New Bag/Given   cefTAZidime (FORTAZ) 2 g in sodium chloride 0.9 % 100 mL IVPB 2 g 200 mL/hr   08/01/20 2248 New Bag/Given   cefTAZidime (FORTAZ) 2 g in sodium chloride  0.9 % 100 mL IVPB 2 g 200 mL/hr   08/02/20 0920 New Bag/Given   cefTAZidime (FORTAZ) 2 g in sodium chloride 0.9 % 100 mL IVPB 2 g 200 mL/hr   08/02/20 0951 New Bag/Given   vancomycin (VANCOREADY) IVPB 1000 mg/200 mL 1,000 mg 200 mL/hr   08/02/20 2323 New Bag/Given  [medication not available]   cefTAZidime (FORTAZ) 2 g in sodium chloride 0.9 % 100 mL IVPB 2 g 200 mL/hr   08/03/20 1114 New Bag/Given   vancomycin (VANCOREADY) IVPB 1000 mg/200 mL 1,000 mg 200 mL/hr   08/03/20 1253 New Bag/Given   cefTAZidime (FORTAZ) 2 g in sodium chloride 0.9 % 100 mL IVPB 2 g 200 mL/hr       Subjective: Calm   Objective: Vitals:   08/03/20 1155 08/03/20 1158 08/03/20 1215 08/03/20 1600  BP: 100/67 115/65 115/65 99/70  Pulse: (!) 111 (!) 110 (!) 110 (!) 111  Resp: (!) 32 (!) 32  (!) 36  Temp: 99.4 F (37.4 C) 99.4 F (37.4 C)  99.7 F (37.6 C)  TempSrc: Axillary   Axillary  SpO2: 100%   100%  Weight:      Height:        Intake/Output Summary (Last 24 hours) at 08/03/2020 1612 Last data filed at 08/03/2020 0518 Gross per 24 hour  Intake 130 ml  Output 1050 ml  Net -920 ml   Filed Weights   08/01/20 0235 08/02/20 0309 08/03/20 0613  Weight: 48.2 kg 46.4 kg 49.3 kg    Examination:  General exam: Appears calm and comfortable  Respiratory system: Clear to auscultation. Respiratory effort normal. Cardiovascular system: S1 & S2 heard, RRR.  No pedal edema. Gastrointestinal system: Abdomen is soft bowel sounds normal  Central nervous system: Lethargic not following commands Extremities: No pedal edema Skin: No ulcers Psychiatry: Cannot be assessed    Data Reviewed: I have personally reviewed following labs and imaging studies  CBC: Recent Labs  Lab 07/28/20 0319 07/30/20 0413 08/01/20 0343 08/02/20 0354 08/03/20 0400  WBC 14.5* 17.5* 21.8* 19.8* 15.3*  HGB 8.5* 8.4* 9.4* 9.0* 9.4*  HCT 25.9* 26.4* 30.3* 29.5* 29.8*  MCV 93.5 95.3 96.5 98.3 94.9  PLT 282 389 408* 428* 448*     Basic Metabolic Panel: Recent Labs  Lab 07/30/20 0413 07/31/20 0334 08/01/20 0343 08/02/20 0354 08/03/20 0400  NA 142 144 146* 151* 150*  K 4.3 4.1 3.9 3.7 3.9  CL 109 109 109 118* 121*  CO2 27 29 27 25 22   GLUCOSE 201* 167* 254* 253* 177*  BUN 31* 30* 30* 31* 29*  CREATININE 1.11 1.09 1.00 0.88 0.95  CALCIUM 8.0* 8.1* 8.4* 8.2* 8.1*  MG  --   --  2.4  --   --     GFR: Estimated Creatinine Clearance: 53.3 mL/min (by C-G formula based on SCr of 0.95 mg/dL).  Liver Function Tests: Recent Labs  Lab 07/28/20 0319 07/29/20 0510 07/30/20 0413 07/31/20 0334 08/01/20 0343 08/01/20 1440  AST 176* 116* 75* 55* 61*  --   ALT 150* 121* 102* 82*  81*  --   ALKPHOS 108 101 100 97 104  --   BILITOT 0.8 0.8 0.7 0.6 0.8  --   PROT 5.7* 5.5* 5.7* 5.8* 6.1* 6.0*  ALBUMIN 2.1* 2.0* 1.9* 1.9* 2.0*  --     CBG: Recent Labs  Lab 08/03/20 0025 08/03/20 0347 08/03/20 0754 08/03/20 1152 08/03/20 1551  GLUCAP 229* 181* 184* 217* 180*     Recent Results (from the past 240 hour(s))  Culture, Respiratory w Gram Stain     Status: None   Collection Time: 07/26/20 10:30 AM   Specimen: Tracheal Aspirate; Respiratory  Result Value Ref Range Status   Specimen Description TRACHEAL ASPIRATE  Final   Special Requests NONE  Final   Gram Stain   Final    RARE WBC PRESENT, PREDOMINANTLY MONONUCLEAR NO ORGANISMS SEEN Performed at [facility], [address], [place], [place] [address]    Culture RARE CANDIDA ALBICANS  Final   Report Status 07/28/2020 FINAL  Final  Culture, Respiratory w Gram Stain     Status: None (Preliminary result)   Collection Time: 07/31/20  8:00 AM   Specimen: Tracheal Aspirate; Respiratory  Result Value Ref Range Status   Specimen Description TRACHEAL ASPIRATE  Final   Special Requests NONE  Final   Gram Stain   Final    FEW WBC PRESENT, PREDOMINANTLY MONONUCLEAR NO ORGANISMS SEEN    Culture   Final    NO GROWTH 1 DAY Performed at [facility], [address], [place], [place] [address]    Report Status PENDING  Incomplete  Body fluid culture w Gram Stain     Status: None (Preliminary result)   Collection Time: 08/01/20  2:40 PM   Specimen: Fluid  Result Value Ref Range Status   Specimen Description FLUID PLEURAL  Final   Special Requests NONE  Final   Gram Stain   Final    FEW WBC PRESENT,BOTH PMN AND MONONUCLEAR NO ORGANISMS SEEN    Culture   Final    NO GROWTH 2 DAYS Performed at [facility], [address] [place], [place] [address]    Report Status PENDING  Incomplete         Radiology Studies: DG Chest Port 1 View  Result Date: 08/02/2020 CLINICAL DATA:  65 year old male with respiratory failure. Hypoxia. STEMI. Found down. EXAM: PORTABLE CHEST 1 VIEW COMPARISON:  Portable chest 08/01/2020 and earlier. FINDINGS: Portable AP supine view at 0426 hours. Patient more rotated to the left today. Stable visible enteric feeding tube, looped once in the proximal stomach. Stable right IJ central line. Stable cardiac size and mediastinal contours. No pneumothorax. No pleural effusion is evident. Continued widespread increased interstitial opacity, with additional patchy and platelike opacity at the lung bases. Ventilation has improved since 07/31/2020, stable since yesterday. IMPRESSION: 1. Stable lines and tubes. 2. Stable ventilation from yesterday with widespread interstitial opacity, improved since 07/31/2020, and bibasilar atelectasis. Electronically Signed   By: Odessa Fleming M.D.   On: 08/02/2020 06:41        Scheduled Meds: . amiodarone  200 mg Per Tube Daily  . aspirin  81 mg Per Tube Daily  . chlorhexidine  15 mL Mouth Rinse BID  . Chlorhexidine Gluconate Cloth  6 each Topical Daily  . clonazepam  0.5 mg Per Tube TID  . digoxin  0.125 mg Per Tube Daily  . docusate  100 mg Per Tube BID  . enoxaparin (LOVENOX) injection  30 mg Subcutaneous Daily  . famotidine  20 mg Per Tube BID  . feeding supplement  (PROSource TF)  45 mL Per Tube BID  . free water  100 mL Per Tube Q4H  . insulin aspart  0-15 Units Subcutaneous Q4H  . insulin aspart  3 Units Subcutaneous Q4H  . ipratropium-albuterol  3 mL Nebulization TID  . mouth rinse  15 mL Mouth Rinse q12n4p  . midodrine  5 mg Per Tube TID WC  . nicotine  14 mg Transdermal Daily  . oxyCODONE  5 mg Per Tube Q8H  . polyethylene glycol  17 g Per Tube Daily  . rosuvastatin  40 mg Per Tube Daily  . sodium chloride flush  10-40 mL Intracatheter Q12H  . sodium chloride flush  3 mL Intravenous Q12H  . ticagrelor  90 mg Per Tube BID   Continuous Infusions: . sodium chloride Stopped (07/31/20 1941)  . sodium chloride    . cefTAZidime (FORTAZ)  IV 2 g (08/03/20 1253)  . feeding supplement (OSMOLITE 1.5 CAL) 1,000 mL (08/02/20 1559)  . vancomycin       LOS: 13 days    Time spent: 42 minutes.     Kathlen Mody, MD [facility]   To contact the attending provider between 7A-7P or the covering provider during after hours 7P-7A, please log into the web site [email or web address] and access using universal [place] password for that web site. If you do not have the password, please call the hospital operator.  08/03/2020, 4:12 PM

## 2020-08-03 NOTE — Progress Notes (Signed)
  Speech Language Pathology Treatment: Dysphagia  Patient Details Name: Vincent Horton MRN: [id number] DOB: October 15, 1955 Today's Date: 08/03/2020 Time: 6010-9323 SLP Time Calculation (min) (ACUTE ONLY): 20 min  Assessment / Plan / Recommendation Clinical Impression  Pt is very lethargic this afternoon - sitter reports he has been since his medications this morning. Attempted to perform oral care, but he is also not opening his mouth very much today. He needs Max cues today to maintain basic alertness to PO trials, so only a very limited quantity was given. Oral holding and anterior spillage is even more pronounced, with coughing and wet vocal quality also noted. Although returning moisture to his oral cavity may help to facilitate improved oral hygiene, would continue to provide this only via means of oral hygiene until he is more alert and can start to swallow more consistently. SLP will continue to follow for readiness.   [employer] video interpreters were used for this session: Vincent Horton #[id number] and then after connection was lost, resumed session with Vincent Horton #[id number].   HPI HPI: Pt is a 65 yo male found down by family VF arrest associated with STEMI, shocked 5x, 15 min CPR before ROSC achieved. ETT 5/21-5/31. CXR 5/31 with persistent bibasilar infiltrates/edema. PMH includes cardiac arrest, CAD, dysrhythmia, myocardial infarction. Per pt's son, pt's oral intake at home was limited to very soft and/or liquidised foods PTA due to condition of dentition.      SLP Plan  Continue with current plan of care       Recommendations  Diet recommendations: NPO Medication Administration: Via alternative means                Oral Care Recommendations: Oral care QID Follow up Recommendations: Inpatient Rehab SLP Visit Diagnosis: Dysphagia, unspecified (R13.10) Plan: Continue with current plan of care       GO                Vincent Horton., M.A. CCC-SLP Acute Rehabilitation Services Pager  [phone] Office [phone]  08/03/2020, 4:25 PM

## 2020-08-03 NOTE — Progress Notes (Signed)
Physical Therapy Treatment Patient Details Name: Vincent Horton MRN: [id number] DOB: September 21, 1955 Today's Date: 08/03/2020    History of Present Illness Pt is a 65 y/o male seen on 07/21/2020 for evaluation of STEMI. Upon EMS arrival, pt apneic and was in VFib and shocked 5 times with 15-min CPR before ROSC achieved. Pt intubated on site (5/21) and transferred to [facility]. Recent CXR findings reveal diffuse bilateral pulmonary infiltrates/edema, progressed from  prior exam. Bibasilar atelectasis. Small right pleural effusion. Pt extubated on 07/31/2020. PMH includes cardiac arrest, CAD, dysrhythmia, myocardial infarction.    PT Comments    Pt with increased lethargy compared to previous session. Pt demonstrates increased weakness and less ability to initiate mobility. Pt with increased assistance to maintain sitting balance as well during this session, often falling forward and becoming dependent on PT assistance to prevent falls. Pt will continue to benefit from acute PT POC to improve mobility quality and to reduce caregiver burden. PT updates recommendations to SNF as pt continues to demonstrate limited activity tolerance. Pt may benefit from music being played in the room to stimulate and arouse the patient if family are able to provide or suggest music to the patient's liking.   Follow Up Recommendations  SNF     Equipment Recommendations  Wheelchair (measurements PT);Wheelchair cushion (measurements PT);Hospital bed (mechanical lift)    Recommendations for Other Services       Precautions / Restrictions Precautions Precautions: Fall Restrictions Weight Bearing Restrictions: No    Mobility  Bed Mobility Overal bed mobility: Needs Assistance Bed Mobility: Supine to Sit;Sit to Supine     Supine to sit: Max assist;HOB elevated Sit to supine: Total assist   General bed mobility comments: tactile cueing to initiate mobility, physical assistance to elevate trunk into sitting     Transfers Overall transfer level:  (deferred 2/2 lethargy and reported fatigue)               General transfer comment: Deferred transferrring at this time due to lethargy, difficulty following commands and decreased balance in sitting.  Ambulation/Gait                 Stairs             Wheelchair Mobility    Modified Rankin (Stroke Patients Only)       Balance Overall balance assessment: Needs assistance Sitting-balance support: Single extremity supported;Bilateral upper extremity supported;Feet supported Sitting balance-Leahy Scale: Zero Sitting balance - Comments: totalA at times due to anterior LOB and lean                                    Cognition Arousal/Alertness: Lethargic Behavior During Therapy: Flat affect Overall Cognitive Status: No family/caregiver present to determine baseline cognitive functioning Area of Impairment: Attention;Following commands;Problem solving                   Current Attention Level: Focused   Following Commands: Follows one step commands inconsistently;Follows one step commands with increased time              Exercises      General Comments General comments (skin integrity, edema, etc.): VSS although pt reporting dizziness. BP 111/84 sitting at edge of bed, increased from pre-mobility BP      Pertinent Vitals/Pain Pain Assessment: Faces Faces Pain Scale: No hurt    Home Living  Prior Function            PT Goals (current goals can now be found in the care plan section) Acute Rehab PT Goals Patient Stated Goal: none stated. Progress towards PT goals: Not progressing toward goals - comment    Frequency    Min 2X/week      PT Plan Discharge plan needs to be updated    Co-evaluation              AM-PAC PT "6 Clicks" Mobility   Outcome Measure  Help needed turning from your back to your side while in a flat bed without using  bedrails?: A Lot Help needed moving from lying on your back to sitting on the side of a flat bed without using bedrails?: A Lot Help needed moving to and from a bed to a chair (including a wheelchair)?: Total Help needed standing up from a chair using your arms (e.g., wheelchair or bedside chair)?: Total Help needed to walk in hospital room?: Total Help needed climbing 3-5 steps with a railing? : Total 6 Click Score: 8    End of Session   Activity Tolerance: Patient limited by fatigue Patient left: in bed;with call bell/phone within reach;with bed alarm set;with nursing/sitter in room Nurse Communication: Mobility status;Need for lift equipment PT Visit Diagnosis: Unsteadiness on feet (R26.81);Other abnormalities of gait and mobility (R26.89);Muscle weakness (generalized) (M62.81);Pain     Time: 1610-9604 PT Time Calculation (min) (ACUTE ONLY): 28 min  Charges:  $Therapeutic Activity: 23-37 mins                     Arlyss Gandy, PT, DPT Acute Rehabilitation Pager: [phone]    Arlyss Gandy 08/03/2020, 5:14 PM

## 2020-08-03 NOTE — Progress Notes (Addendum)
Occupational Therapy Treatment Patient Details Name: Vincent Horton MRN: [id number] DOB: December 28, 1955 Today's Date: 08/03/2020    History of present illness Pt is a 65 y/o male seen on 07/21/2020 for evaluation of STEMI. Upon EMS arrival, pt apneic and was in VFib and shocked 5 times with 15-min CPR before ROSC achieved. Pt intubated on site (5/21) and transferred to [facility]. Recent CXR findings reveal diffuse bilateral pulmonary infiltrates/edema, progressed from  prior exam. Bibasilar atelectasis. Small right pleural effusion. Pt extubated on 07/31/2020. PMH includes cardiac arrest, CAD, dysrhythmia, myocardial infarction.   OT comments  Pt making incremental progress this session. He was medicated prior to OT session, potentially causing pt to be increasingly lethargic throughout. Pt did participate in therapy and work to sit EOB and complete grooming activities. Due to lethargy/cognition, pt had difficulties following commands to wash his face and hands, however does have PROM to complete these tasks. Pt requires min A-Max A to maintain upright sitting EOB. Acute OT will continue to follow up to assist with progressing functional mobility and ADL's.  [employer] Interpretor #[id number] - Idil, was used for [name] Interpretor this session.    Follow Up Recommendations  CIR    Equipment Recommendations  Other (comment) (TBD at next venue)    Recommendations for Other Services Rehab consult    Precautions / Restrictions Precautions Precautions: Fall Restrictions Weight Bearing Restrictions: No       Mobility Bed Mobility Overal bed mobility: Needs Assistance Bed Mobility: Supine to Sit;Sit to Supine     Supine to sit: Mod assist;HOB elevated Sit to supine: Max assist;HOB elevated   General bed mobility comments: tactile cueing to initiate mobility, physical assistance to elevate trunk into sitting    Transfers                 General transfer comment: Deferred transferrring at this time  due to lethargy, difficulty following commands and decreased balance in sitting.    Balance Overall balance assessment: Needs assistance Sitting-balance support: Bilateral upper extremity supported;Feet supported Sitting balance-Leahy Scale: Zero                                     ADL either performed or assessed with clinical judgement   ADL Overall ADL's : Needs assistance/impaired Eating/Feeding: NPO Eating/Feeding Details (indicate cue type and reason): Pt on feeding tube Grooming: Moderate assistance;Sitting Grooming Details (indicate cue type and reason): able to bring hand to face, fatigues quickly                               General ADL Comments: Pt very lethargic this session, not opening his eyes for more than a few seconds at a time. Pt had difficulty following commands, unable to bring hands to face without verbal and tactile commands as well as support. Deferred transferring due to lethargy and difficulty following commands.     Vision       Perception     Praxis      Cognition Arousal/Alertness: Lethargic Behavior During Therapy: Flat affect Overall Cognitive Status: No family/caregiver present to determine baseline cognitive functioning Area of Impairment: Following commands                       Following Commands: Follows one step commands inconsistently;Follows one step commands with increased time  Exercises     Shoulder Instructions       General Comments VSS    Pertinent Vitals/ Pain       Pain Assessment: No/denies pain  Home Living                                          Prior Functioning/Environment              Frequency  Min 2X/week        Progress Toward Goals  OT Goals(current goals can now be found in the care plan section)  Progress towards OT goals: Progressing toward goals  Acute Rehab OT Goals Patient Stated Goal: none stated. OT Goal  Formulation: Patient unable to participate in goal setting Time For Goal Achievement: 08/15/20 Potential to Achieve Goals: Good ADL Goals Pt Will Perform Grooming: sitting;with set-up Pt Will Perform Upper Body Dressing: sitting;with modified independence Pt Will Perform Lower Body Dressing: with min guard assist;sit to/from stand Pt Will Transfer to Toilet: with min guard assist;ambulating Pt Will Perform Toileting - Clothing Manipulation and hygiene: with supervision;sitting/lateral leans Additional ADL Goal #1: Pt will perform bed mobility prior to ADL at independent level  Plan Discharge plan remains appropriate;Frequency remains appropriate    Co-evaluation                 AM-PAC OT "6 Clicks" Daily Activity     Outcome Measure   Help from another person eating meals?: Total Help from another person taking care of personal grooming?: A Lot Help from another person toileting, which includes using toliet, bedpan, or urinal?: A Lot Help from another person bathing (including washing, rinsing, drying)?: A Lot Help from another person to put on and taking off regular upper body clothing?: A Lot Help from another person to put on and taking off regular lower body clothing?: A Lot 6 Click Score: 11    End of Session    OT Visit Diagnosis: Unsteadiness on feet (R26.81);Other abnormalities of gait and mobility (R26.89);Muscle weakness (generalized) (M62.81);Other symptoms and signs involving cognitive function   Activity Tolerance Patient limited by lethargy   Patient Left in bed;with call bell/phone within reach;with bed alarm set;with nursing/sitter in room   Nurse Communication Mobility status        Time: 1213-1227 OT Time Calculation (min): 14 min  Charges: OT General Charges $OT Visit: 1 Visit OT Treatments $Self Care/Home Management : 8-22 mins  08/03/2020  Vincent Horton H., OTR/L Acute Rehabilitation  Vincent Horton Vincent Horton 08/03/2020, 2:19 PM

## 2020-08-04 LAB — GLUCOSE, CAPILLARY
Glucose-Capillary: 164 mg/dL — ABNORMAL HIGH (ref 70–99)
Glucose-Capillary: 209 mg/dL — ABNORMAL HIGH (ref 70–99)
Glucose-Capillary: 114 mg/dL — ABNORMAL HIGH (ref 70–99)
Glucose-Capillary: 198 mg/dL — ABNORMAL HIGH (ref 70–99)
Glucose-Capillary: 260 mg/dL — ABNORMAL HIGH (ref 70–99)

## 2020-08-04 LAB — CULTURE, RESPIRATORY W GRAM STAIN: Culture: NO GROWTH

## 2020-08-04 LAB — BASIC METABOLIC PANEL
Anion gap: 5 (ref 5–15)
BUN: 28 mg/dL — ABNORMAL HIGH (ref 8–23)
CO2: 23 mmol/L (ref 22–32)
Calcium: 8.2 mg/dL — ABNORMAL LOW (ref 8.9–10.3)
Chloride: 120 mmol/L — ABNORMAL HIGH (ref 98–111)
Creatinine, Ser: 0.81 mg/dL (ref 0.61–1.24)
GFR, Estimated: >60 mL/min (ref >60)
Glucose, Bld: 200 mg/dL — ABNORMAL HIGH (ref 70–99)
Potassium: 3.6 mmol/L (ref 3.5–5.1)
Sodium: 148 mmol/L — ABNORMAL HIGH (ref 135–145)

## 2020-08-04 LAB — CBC
HCT: 31.2 % — ABNORMAL LOW (ref 39.0–52.0)
Hemoglobin: 9.9 g/dL — ABNORMAL LOW (ref 13.0–17.0)
MCH: 30 pg (ref 26.0–34.0)
MCHC: 31.7 g/dL (ref 30.0–36.0)
MCV: 94.5 fL (ref 80.0–100.0)
Platelets: 435 10*3/uL — ABNORMAL HIGH (ref 150–400)
RBC: 3.3 MIL/uL — ABNORMAL LOW (ref 4.22–5.81)
RDW: 15.9 % — ABNORMAL HIGH (ref 11.5–15.5)
WBC: 15.1 10*3/uL — ABNORMAL HIGH (ref 4.0–10.5)
nRBC: 0 % (ref 0.0–0.2)

## 2020-08-04 LAB — COOXEMETRY PANEL
Carboxyhemoglobin: 1.5 % (ref 0.5–1.5)
Methemoglobin: 1.1 % (ref 0.0–1.5)
O2 Saturation: 68.7 %
Total hemoglobin: 9.9 g/dL — ABNORMAL LOW (ref 12.0–16.0)

## 2020-08-04 LAB — PROTIME-INR
INR: 1.2 (ref 0.8–1.2)
Prothrombin Time: 15 seconds (ref 11.4–15.2)

## 2020-08-04 MED ORDER — IPRATROPIUM-ALBUTEROL 0.5-2.5 (3) MG/3ML IN SOLN: 3.0000 mL | RESPIRATORY_TRACT | Status: DC | PRN

## 2020-08-04 MED ORDER — LOPERAMIDE HCL 1 MG/7.5ML PO SUSP
1.0000 mg | ORAL | Status: DC | PRN
  Administered 2020-08-04 – 2020-08-06 (×2): 1 mg
  Filled 2020-08-04 (×4): qty 7.5

## 2020-08-04 NOTE — Progress Notes (Signed)
MD on call notified that the pt has had over 6 loose bowel movements this shif & seem to be getting worse by the stool. His Colace was held all yesterday 08/03/20.

## 2020-08-04 NOTE — Progress Notes (Signed)
PROGRESS NOTE    Vincent Horton  LXB:[id number] DOB: June 14, 1955 DOA: 07/21/2020 PCP: No primary care provider on file.   Chief Complaint  Patient presents with  . Cardiac Arrest    Brief Narrative:    65 year old male with limited medical history found down by family, EMS was called, he was found to be in V. fib shocked 5 x 3 mg epinephrine 450 mg of amiodarone with ROSC obtained.  He was emergently taken to the Cath Lab showing ST segment changes in anterior lateral leads status post revascularization of LAD.  He was noted to have been moving spontaneously at time of cardiac catheterization.  Transferred to the ICU.  PCCM consulted for help with management. He was extubated on 07/31/2020 and transferred to [facility] on 08/03/2020.   Pt weaned off milrinone gtt. Pt is more alert and awake today.  Plan for SLP Evaluation.   Assessment & Plan:   Principal Problem:   Acute ST elevation myocardial infarction (STEMI) due to occlusion of left anterior descending (LAD) coronary artery (HCC) Active Problems:   Cardiac arrest (HCC)   STEMI involving left anterior descending coronary artery (HCC)   Protein calorie malnutrition (HCC)   Acute hypoxemic respiratory failure (HCC)   Ischemic cardiomyopathy   Aspiration pneumonia (HCC)   Pulmonary edema cardiac cause (HCC)   Acute kidney injury (HCC)   Shock liver   Cardiogenic shock (HCC)   Transaminitis   Protein-calorie malnutrition, severe   Pleural effusion   Cardiac arrest/cardiogenic shock V. fib arrest associated with STEMI , patient shocked 5 times within 15 minutes of CPR prior to ROSC S/p STEMI post LAD stent. Continue with aspirin and Brillinta, Crestor.   Ischemic cardiomyopathy/cardiogenic shock Echocardiogram on 07/21/2020 showed a left ventricular ejection fraction of 30 to 35%, with grade 1 diastolic dysfunction, no LV thrombus. Cardiology on board,  appreciate recommendations. Continue with aspirin, brillinta, Crestor.  Off  diuretics.    Acute respiratory failure with hypoxia probably secondary to healthcare associated pneumonia Complete 7 days of IV antibiotics as per PCCM.   Paroxysmal atrial fibrillation Currently sinus tachycardia. Continue with digoxin, amiodarone.   Anemia of chronic disease S/p 1 unit of PRBC transfusion on 07/27/2020 Hemoglobin stable around 9, continue to monitor.    Hypernatremia Probably secondary to free water deficit, poor oral intake.. Free water infusion via core track.  Recheck sodium levels in the morning show improvement to 148.    Leukocytosis probably from healthcare associated pneumonia. Improving.    Acute metabolic encephalopathy?  Secondary to cardiac arrest, hypernatremia Patient is more alert today, but not following commands.  Initial CT of the head negative. EEG is negative for epileptiform activity. If his mental status does not improve after sodium normalize, will check MRi brain for further evaluation.     Protein calorie malnutrition Dietary consulted and core track/tube feeds     DVT prophylaxis: (Lovenox) Code Status: Full code Family Communication: None at bedside, discussed with son at bedside.  Disposition:   Status is: Inpatient  Remains inpatient appropriate because:Ongoing diagnostic testing needed not appropriate for outpatient work up and IV treatments appropriate due to intensity of illness or inability to take PO   Dispo: The patient is from: Home              Anticipated d/c is to: Pending              Patient currently is not medically stable to d/c.   Difficult to place patient No  Consultants:   Cardiology  PCCM  Procedures:  Cardiac catheterization Intubated and extubated on 5/31.   antimicrobials:  Antibiotics Given (last 72 hours)    Date/Time Action Medication Dose Rate   08/01/20 2248 New Bag/Given   cefTAZidime (FORTAZ) 2 g in sodium chloride 0.9 % 100 mL IVPB 2 g 200 mL/hr   08/02/20  0920 New Bag/Given   cefTAZidime (FORTAZ) 2 g in sodium chloride 0.9 % 100 mL IVPB 2 g 200 mL/hr   08/02/20 0951 New Bag/Given   vancomycin (VANCOREADY) IVPB 1000 mg/200 mL 1,000 mg 200 mL/hr   08/02/20 2323 New Bag/Given  [medication not available]   cefTAZidime (FORTAZ) 2 g in sodium chloride 0.9 % 100 mL IVPB 2 g 200 mL/hr   08/03/20 1114 New Bag/Given   vancomycin (VANCOREADY) IVPB 1000 mg/200 mL 1,000 mg 200 mL/hr   08/03/20 1253 New Bag/Given   cefTAZidime (FORTAZ) 2 g in sodium chloride 0.9 % 100 mL IVPB 2 g 200 mL/hr   08/03/20 1809 New Bag/Given   cefTAZidime (FORTAZ) 2 g in sodium chloride 0.9 % 100 mL IVPB 2 g 200 mL/hr   08/03/20 2333 New Bag/Given   vancomycin (VANCOREADY) IVPB 750 mg/150 mL 750 mg 150 mL/hr   08/04/20 0246 New Bag/Given   cefTAZidime (FORTAZ) 2 g in sodium chloride 0.9 % 100 mL IVPB 2 g 200 mL/hr   08/04/20 1001 New Bag/Given   cefTAZidime (FORTAZ) 2 g in sodium chloride 0.9 % 100 mL IVPB 2 g 200 mL/hr   08/04/20 1144 New Bag/Given   vancomycin (VANCOREADY) IVPB 750 mg/150 mL 750 mg 150 mL/hr       Subjective: Alert, restless in mittens.   Objective: Vitals:   08/03/20 1951 08/03/20 2343 08/03/20 2345 08/04/20 1220  BP:   110/75 124/69  Pulse:  99 100 97  Resp: (!) 25  20 20   Temp: 99.1 F (37.3 C)  98.1 F (36.7 C) 98.2 F (36.8 C)  TempSrc: Oral  Axillary Axillary  SpO2: 100% 100% 100% 100%  Weight:      Height:        Intake/Output Summary (Last 24 hours) at 08/04/2020 1523 Last data filed at 08/04/2020 0550 Gross per 24 hour  Intake --  Output 1600 ml  Net -1600 ml   Filed Weights   08/01/20 0235 08/02/20 0309 08/03/20 3419  Weight: 48.2 kg 46.4 kg 49.3 kg    Examination:  General exam: cachetic looking gentleman, not in distress. On RA.  Respiratory system: COARSE breath sounds throughout.  Cardiovascular system: S1S2, tachycardic, no JVD, no pedal edema Gastrointestinal system: Abdomen is soft nontender bowel sounds  normal Central nervous system: Alert but not oriented Extremities: No pedal edema Skin: No ulcers Psychiatry: Cannot be assessed    Data Reviewed: I have personally reviewed following labs and imaging studies  CBC: Recent Labs  Lab 07/30/20 0413 08/01/20 0343 08/02/20 0354 08/03/20 0400 08/04/20 0420  WBC 17.5* 21.8* 19.8* 15.3* 15.1*  HGB 8.4* 9.4* 9.0* 9.4* 9.9*  HCT 26.4* 30.3* 29.5* 29.8* 31.2*  MCV 95.3 96.5 98.3 94.9 94.5  PLT 389 408* 428* 448* 435*    Basic Metabolic Panel: Recent Labs  Lab 07/31/20 0334 08/01/20 0343 08/02/20 0354 08/03/20 0400 08/04/20 0420  NA 144 146* 151* 150* 148*  K 4.1 3.9 3.7 3.9 3.6  CL 109 109 118* 121* 120*  CO2 29 27 25 22 23   GLUCOSE 167* 254* 253* 177* 200*  BUN 30* 30* 31* 29* 28*  CREATININE 1.09 1.00 0.88 0.95 0.81  CALCIUM 8.1* 8.4* 8.2* 8.1* 8.2*  MG  --  2.4  --   --   --     GFR: Estimated Creatinine Clearance: 62.6 mL/min (by C-G formula based on SCr of 0.81 mg/dL).  Liver Function Tests: Recent Labs  Lab 07/29/20 0510 07/30/20 0413 07/31/20 0334 08/01/20 0343 08/01/20 1440  AST 116* 75* 55* 61*  --   ALT 121* 102* 82* 81*  --   ALKPHOS 101 100 97 104  --   BILITOT 0.8 0.7 0.6 0.8  --   PROT 5.5* 5.7* 5.8* 6.1* 6.0*  ALBUMIN 2.0* 1.9* 1.9* 2.0*  --     CBG: Recent Labs  Lab 08/03/20 2022 08/03/20 2331 08/04/20 0324 08/04/20 0751 08/04/20 1219  GLUCAP 190* 153* 164* 209* 114*     Recent Results (from the past 240 hour(s))  Culture, Respiratory w Gram Stain     Status: None   Collection Time: 07/26/20 10:30 AM   Specimen: Tracheal Aspirate; Respiratory  Result Value Ref Range Status   Specimen Description TRACHEAL ASPIRATE  Final   Special Requests NONE  Final   Gram Stain   Final    RARE WBC PRESENT, PREDOMINANTLY MONONUCLEAR NO ORGANISMS SEEN Performed at [facility], [address] [place], [place] [address]    Culture RARE CANDIDA ALBICANS  Final   Report Status 07/28/2020  FINAL  Final  Culture, Respiratory w Gram Stain     Status: None   Collection Time: 07/31/20  8:00 AM   Specimen: Tracheal Aspirate; Respiratory  Result Value Ref Range Status   Specimen Description TRACHEAL ASPIRATE  Final   Special Requests NONE  Final   Gram Stain   Final    FEW WBC PRESENT, PREDOMINANTLY MONONUCLEAR NO ORGANISMS SEEN    Culture   Final    NO GROWTH 2 DAYS Performed at [facility], [address], [place], [place] [address]    Report Status 08/04/2020 FINAL  Final  Body fluid culture w Gram Stain     Status: None (Preliminary result)   Collection Time: 08/01/20  2:40 PM   Specimen: Fluid  Result Value Ref Range Status   Specimen Description FLUID PLEURAL  Final   Special Requests NONE  Final   Gram Stain   Final    FEW WBC PRESENT,BOTH PMN AND MONONUCLEAR NO ORGANISMS SEEN    Culture   Final    NO GROWTH 3 DAYS Performed at [facility], [address] [place], [place] [address]    Report Status PENDING  Incomplete         Radiology Studies: No results found.      Scheduled Meds: . amiodarone  200 mg Per Tube Daily  . aspirin  81 mg Per Tube Daily  . chlorhexidine  15 mL Mouth Rinse BID  . Chlorhexidine Gluconate Cloth  6 each Topical Daily  . clonazepam  0.5 mg Per Tube TID  . digoxin  0.125 mg Per Tube Daily  . enoxaparin (LOVENOX) injection  30 mg Subcutaneous Daily  . famotidine  20 mg Per Tube BID  . feeding supplement (PROSource TF)  45 mL Per Tube BID  . free water  100 mL Per Tube Q4H  . insulin aspart  0-15 Units Subcutaneous Q4H  . insulin aspart  3 Units Subcutaneous Q4H  . mouth rinse  15 mL Mouth Rinse q12n4p  . midodrine  5 mg Per  Tube TID WC  . nicotine  14 mg Transdermal Daily  . oxyCODONE  5 mg Per Tube Q8H  . rosuvastatin  40 mg Per Tube Daily  . sodium chloride flush  10-40 mL Intracatheter Q12H  . ticagrelor  90 mg Per Tube BID   Continuous Infusions: . sodium chloride Stopped (07/31/20 1941)  .  sodium chloride    . cefTAZidime (FORTAZ)  IV 2 g (08/04/20 1001)  . feeding supplement (OSMOLITE 1.5 CAL) 1,000 mL (08/02/20 1559)  . vancomycin 750 mg (08/04/20 1144)     LOS: 14 days       Kathlen Mody, MD [facility]   To contact the attending provider between 7A-7P or the covering provider during after hours 7P-7A, please log into the web site [email or web address] and access using universal [place] password for that web site. If you do not have the password, please call the hospital operator.  08/04/2020, 3:23 PM

## 2020-08-04 NOTE — Progress Notes (Signed)
Progress Note  Patient Name: Vincent Horton Date of Encounter: 08/04/2020  Primary Cardiologist: Advanced Heart Failure Team  Subjective   Awake, follows questions with his eyes but no verbal response.  Inpatient Medications    Scheduled Meds: . amiodarone  200 mg Per Tube Daily  . aspirin  81 mg Per Tube Daily  . chlorhexidine  15 mL Mouth Rinse BID  . Chlorhexidine Gluconate Cloth  6 each Topical Daily  . clonazepam  0.5 mg Per Tube TID  . digoxin  0.125 mg Per Tube Daily  . enoxaparin (LOVENOX) injection  30 mg Subcutaneous Daily  . famotidine  20 mg Per Tube BID  . feeding supplement (PROSource TF)  45 mL Per Tube BID  . free water  100 mL Per Tube Q4H  . insulin aspart  0-15 Units Subcutaneous Q4H  . insulin aspart  3 Units Subcutaneous Q4H  . ipratropium-albuterol  3 mL Nebulization TID  . mouth rinse  15 mL Mouth Rinse q12n4p  . midodrine  5 mg Per Tube TID WC  . nicotine  14 mg Transdermal Daily  . oxyCODONE  5 mg Per Tube Q8H  . polyethylene glycol  17 g Per Tube Daily  . rosuvastatin  40 mg Per Tube Daily  . sodium chloride flush  10-40 mL Intracatheter Q12H  . sodium chloride flush  3 mL Intravenous Q12H  . ticagrelor  90 mg Per Tube BID   Continuous Infusions: . sodium chloride Stopped (07/31/20 1941)  . sodium chloride    . cefTAZidime (FORTAZ)  IV 2 g (08/04/20 0246)  . feeding supplement (OSMOLITE 1.5 CAL) 1,000 mL (08/02/20 1559)  . vancomycin 750 mg (08/03/20 2333)   PRN Meds: Place/Maintain arterial line **AND** sodium chloride, acetaminophen (TYLENOL) oral liquid 160 mg/5 mL, guaiFENesin-dextromethorphan, haloperidol lactate, ondansetron (ZOFRAN) IV, sodium chloride flush, sodium chloride flush   Vital Signs    Vitals:   08/03/20 1950 08/03/20 1951 08/03/20 2343 08/03/20 2345  BP: 105/70   110/75  Pulse: (!) 109  99 100  Resp:  (!) 25  20  Temp:  99.1 F (37.3 C)  98.1 F (36.7 C)  TempSrc:  Oral  Axillary  SpO2: 100% 100% 100% 100%  Weight:       Height:        Intake/Output Summary (Last 24 hours) at 08/04/2020 0920 Last data filed at 08/04/2020 0550 Gross per 24 hour  Intake --  Output 1600 ml  Net -1600 ml   Filed Weights   08/01/20 0235 08/02/20 0309 08/03/20 0613  Weight: 48.2 kg 46.4 kg 49.3 kg    Telemetry    Sinus rhythm.  Personally reviewed.  ECG    An ECG dated 08/01/2020 was personally reviewed today and demonstrated:  Sinus rhythm with anteroseptal infarct pattern.  Physical Exam   GEN:  Cachectic male, no acute distress.   Neck: No JVD. + Cortrak. Cardiac: RRR, no gallop.  Respiratory: Nonlabored. Clear to auscultation bilaterally. GI: Soft, nontender, bowel sounds present. MS:  Muscle wasting.  No edema.  Labs    Chemistry Recent Labs  Lab 07/30/20 0413 07/31/20 0334 08/01/20 0343 08/01/20 1440 08/02/20 0354 08/03/20 0400 08/04/20 0420  NA 142 144 146*  --  151* 150* 148*  K 4.3 4.1 3.9  --  3.7 3.9 3.6  CL 109 109 109  --  118* 121* 120*  CO2 27 29 27   --  25 22 23   GLUCOSE 201* 167* 254*  --  253* 177* 200*  BUN 31* 30* 30*  --  31* 29* 28*  CREATININE 1.11 1.09 1.00  --  0.88 0.95 0.81  CALCIUM 8.0* 8.1* 8.4*  --  8.2* 8.1* 8.2*  PROT 5.7* 5.8* 6.1* 6.0*  --   --   --   ALBUMIN 1.9* 1.9* 2.0*  --   --   --   --   AST 75* 55* 61*  --   --   --   --   ALT 102* 82* 81*  --   --   --   --   ALKPHOS 100 97 104  --   --   --   --   BILITOT 0.7 0.6 0.8  --   --   --   --   GFRNONAA >60 >60 >60  --  >60 >60 >60  ANIONGAP 6 6 10   --  8 7 5      Hematology Recent Labs  Lab 08/02/20 0354 08/03/20 0400 08/04/20 0420  WBC 19.8* 15.3* 15.1*  RBC 3.00* 3.14* 3.30*  HGB 9.0* 9.4* 9.9*  HCT 29.5* 29.8* 31.2*  MCV 98.3 94.9 94.5  MCH 30.0 29.9 30.0  MCHC 30.5 31.5 31.7  RDW 15.9* 15.9* 15.9*  PLT 428* 448* 435*    Cardiac Enzymes Recent Labs  Lab 07/21/20 0109 07/21/20 0429  TROPONINIHS 2,747* 9,770*    Radiology    No results found.  Cardiac Studies   Cardiac  catheterization 07/21/2020:  Prox LAD to Mid LAD lesion is 100% stenosed.  Post intervention, there is a 0% residual stenosis.  A drug-eluting stent was successfully placed using a SYNERGY XD 2.50X38.   1. 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 Successful PCI of the proximal to mid LAD with DES. Some distal embolization to the apical LAD 3. Normal right heart pressures mean PAP 20 mm Hg 4. Normal PCWP 12 mm Hg 5. Low cardiac output 2.7 L/min with index 1.73.  6. Low cardiac power 0.48.  Echocardiogram 07/21/2020: 1. Coarse apical trabeculation and calcified LV apical false tendon  (normal variant) -no mural thrombus. Left ventricular ejection fraction,  by estimation, is 30 to 35%. The left ventricle has moderately decreased  function. The left ventricle  demonstrates regional wall motion abnormalities (see scoring  diagram/findings for description). Left ventricular diastolic parameters  are consistent with Grade I diastolic dysfunction (impaired relaxation).  There is severe hypokinesis of the left  ventricular, entire anteroseptal wall, anterior wall, apical segment and  inferoapical segment.  2. Right ventricular systolic function is normal. The right ventricular  size is normal. There is normal pulmonary artery systolic pressure. The  estimated right ventricular systolic pressure is 29.6 mmHg.  3. The pericardial effusion is posterior to the left ventricle.  4. The mitral valve is abnormal. Trivial mitral valve regurgitation.  5. The aortic valve is tricuspid. Aortic valve regurgitation is not  visualized.  6. The inferior vena cava is normal in size with greater than 50%  respiratory variability, suggesting right atrial pressure of 3 mmHg.   Assessment & Plan    1.  CAD status post anterior STEMI managed with DES to the proximal LAD, complicated by distal embolization.  Continues on aspirin, Brilinta, and Crestor.  2.  Ischemic cardiomyopathy with  cardiogenic shock, LVEF 30 to 35% range, RV contraction normal.  He was weaned off amiodarone yesterday.  Co-ox 69%.  Diuretics held with low CVP.  Otherwise on Lanoxin and ProAmatine for blood pressure  support.  3.  Status post cardiac arrest with VF in the setting of STEMI, 15 minutes of ACLS prior to ROSC.  Rhythm stable.  He is on amiodarone at this time.  4.  Delirium/metabolic encephalopathy.  Has feeding tube in place in light of dysphagia.  No diuretics today.  Tolerating off milrinone.  Continue amiodarone, Lanoxin, current dose of ProAmatine, aspirin, Brilinta, and Crestor.  Hold off on beta-blocker.  Might be able to add low-dose Aldactone next.  Signed, Nona Dell, MD  08/04/2020, 9:20 AM

## 2020-08-05 LAB — BASIC METABOLIC PANEL
Anion gap: 4 — ABNORMAL LOW (ref 5–15)
BUN: 26 mg/dL — ABNORMAL HIGH (ref 8–23)
CO2: 23 mmol/L (ref 22–32)
Calcium: 8.1 mg/dL — ABNORMAL LOW (ref 8.9–10.3)
Chloride: 118 mmol/L — ABNORMAL HIGH (ref 98–111)
Creatinine, Ser: 0.76 mg/dL (ref 0.61–1.24)
GFR, Estimated: >60 mL/min (ref >60)
Glucose, Bld: 218 mg/dL — ABNORMAL HIGH (ref 70–99)
Potassium: 3.8 mmol/L (ref 3.5–5.1)
Sodium: 145 mmol/L (ref 135–145)

## 2020-08-05 LAB — COOXEMETRY PANEL
Carboxyhemoglobin: 1.5 % (ref 0.5–1.5)
Methemoglobin: 1.1 % (ref 0.0–1.5)
O2 Saturation: 58.8 %
Total hemoglobin: 10.2 g/dL — ABNORMAL LOW (ref 12.0–16.0)

## 2020-08-05 LAB — GLUCOSE, CAPILLARY
Glucose-Capillary: 101 mg/dL — ABNORMAL HIGH (ref 70–99)
Glucose-Capillary: 126 mg/dL — ABNORMAL HIGH (ref 70–99)
Glucose-Capillary: 141 mg/dL — ABNORMAL HIGH (ref 70–99)
Glucose-Capillary: 165 mg/dL — ABNORMAL HIGH (ref 70–99)
Glucose-Capillary: 193 mg/dL — ABNORMAL HIGH (ref 70–99)
Glucose-Capillary: 224 mg/dL — ABNORMAL HIGH (ref 70–99)

## 2020-08-05 LAB — CBC
HCT: 31.5 % — ABNORMAL LOW (ref 39.0–52.0)
Hemoglobin: 10 g/dL — ABNORMAL LOW (ref 13.0–17.0)
MCH: 30.1 pg (ref 26.0–34.0)
MCHC: 31.7 g/dL (ref 30.0–36.0)
MCV: 94.9 fL (ref 80.0–100.0)
Platelets: 406 10*3/uL — ABNORMAL HIGH (ref 150–400)
RBC: 3.32 MIL/uL — ABNORMAL LOW (ref 4.22–5.81)
RDW: 15.7 % — ABNORMAL HIGH (ref 11.5–15.5)
WBC: 14.1 10*3/uL — ABNORMAL HIGH (ref 4.0–10.5)
nRBC: 0 % (ref 0.0–0.2)

## 2020-08-05 LAB — BODY FLUID CULTURE W GRAM STAIN: Culture: NO GROWTH

## 2020-08-05 LAB — PROTIME-INR
INR: 1.1 (ref 0.8–1.2)
Prothrombin Time: 13.9 seconds (ref 11.4–15.2)

## 2020-08-05 NOTE — Progress Notes (Signed)
PROGRESS NOTE    Vincent Horton  OZD:[id number] DOB: [birth year] DOA: 07/21/2020 PCP: No primary care provider on file.   Chief Complaint  Patient presents with  . Cardiac Arrest    Brief Narrative:    65 year old male with limited medical history found down by family, EMS was called, he was found to be in V. fib shocked 5 x 3 mg epinephrine 450 mg of amiodarone with ROSC obtained.  He was emergently taken to the Cath Lab showing ST segment changes in anterior lateral leads status post revascularization of LAD.  He was noted to have been moving spontaneously at time of cardiac catheterization.  Transferred to the ICU.  PCCM consulted for help with management. He was extubated on 07/31/2020 and transferred to [facility] on 08/03/2020.   Pt weaned off milrinone gtt. Patient seen and examined this am. Pt is more alert and awake, asking to remove the mittens, requesting for water . Communicates,  its been 3 days since he drank water.  He remains on RA, appears comfortable.   Assessment & Plan:   Principal Problem:   Acute ST elevation myocardial infarction (STEMI) due to occlusion of left anterior descending (LAD) coronary artery (HCC) Active Problems:   Cardiac arrest (HCC)   STEMI involving left anterior descending coronary artery (HCC)   Protein calorie malnutrition (HCC)   Acute hypoxemic respiratory failure (HCC)   Ischemic cardiomyopathy   Aspiration pneumonia (HCC)   Pulmonary edema cardiac cause (HCC)   Acute kidney injury (HCC)   Shock liver   Cardiogenic shock (HCC)   Transaminitis   Protein-calorie malnutrition, severe   Pleural effusion   Cardiac arrest/cardiogenic shock V. fib arrest associated with STEMI , patient shocked 5 times within 15 minutes of CPR prior to ROSC S/p STEMI post LAD stent. Continue with aspirin and Brillinta, Crestor. Cardiology on board, recommended to continue monitoring off milrinone. Overall prognosis is poor. Will get palliative care consult for goals of  care.    Ischemic cardiomyopathy/cardiogenic shock Echocardiogram on 07/21/2020 showed a left ventricular ejection fraction of 30 to 35%, with grade 1 diastolic dysfunction, no LV thrombus. Cardiology on board,  appreciate recommendations. Continue with aspirin, brillinta, Crestor.  Off diuretics for now.    Acute respiratory failure with hypoxia probably secondary to healthcare associated pneumonia Complete 7 days of IV antibiotics as per PCCM.pt on RA, no distress noted.    Paroxysmal atrial fibrillation Currently sinus tachycardia. Continue with digoxin, amiodarone.   Anemia of chronic disease S/p 1 unit of PRBC transfusion on 07/27/2020 Hemoglobin stable around 9, continue to monitor.    Hypernatremia Probably secondary to free water deficit, poor oral intake.. Free water infusion via core track.  Recheck sodium levels in the morning show improvement to 145. Continue to monitor.   Leukocytosis probably from healthcare associated pneumonia. Improving.  Wbc count at 14,100   Acute metabolic encephalopathy?  Secondary to cardiac arrest, hypernatremia Patient is more alert today, but not following commands.  Initial CT of the head negative. EEG is negative for epileptiform activity. Menta status improving     Protein calorie malnutrition Dietary consulted and core track/tube feeds.  SLP evaluation today, and see if he can swallow and take nutrition orally.      DVT prophylaxis: (Lovenox) Code Status: Full code Family Communication: None at bedside, discussed with son over the phone.  Disposition:   Status is: Inpatient  Remains inpatient appropriate because:Ongoing diagnostic testing needed not appropriate for outpatient work up and IV treatments appropriate  due to intensity of illness or inability to take PO   Dispo: The patient is from: Home              Anticipated d/c is to: Pending              Patient currently is not medically stable to d/c.    Difficult to place patient No       Consultants:   Cardiology  PCCM  Procedures:  Cardiac catheterization Intubated and extubated on 5/31.   antimicrobials:  Antibiotics Given (last 72 hours)    Date/Time Action Medication Dose Rate   08/02/20 2323 New Bag/Given  [medication not available]   cefTAZidime (FORTAZ) 2 g in sodium chloride 0.9 % 100 mL IVPB 2 g 200 mL/hr   08/03/20 1114 New Bag/Given   vancomycin (VANCOREADY) IVPB 1000 mg/200 mL 1,000 mg 200 mL/hr   08/03/20 1253 New Bag/Given   cefTAZidime (FORTAZ) 2 g in sodium chloride 0.9 % 100 mL IVPB 2 g 200 mL/hr   08/03/20 1809 New Bag/Given   cefTAZidime (FORTAZ) 2 g in sodium chloride 0.9 % 100 mL IVPB 2 g 200 mL/hr   08/03/20 2333 New Bag/Given   vancomycin (VANCOREADY) IVPB 750 mg/150 mL 750 mg 150 mL/hr   08/04/20 0246 New Bag/Given   cefTAZidime (FORTAZ) 2 g in sodium chloride 0.9 % 100 mL IVPB 2 g 200 mL/hr   08/04/20 1001 New Bag/Given   cefTAZidime (FORTAZ) 2 g in sodium chloride 0.9 % 100 mL IVPB 2 g 200 mL/hr   08/04/20 1144 New Bag/Given   vancomycin (VANCOREADY) IVPB 750 mg/150 mL 750 mg 150 mL/hr   08/04/20 1818 New Bag/Given   cefTAZidime (FORTAZ) 2 g in sodium chloride 0.9 % 100 mL IVPB 2 g 200 mL/hr   08/04/20 2331 New Bag/Given   vancomycin (VANCOREADY) IVPB 750 mg/150 mL 750 mg 150 mL/hr   08/05/20 0118 New Bag/Given   cefTAZidime (FORTAZ) 2 g in sodium chloride 0.9 % 100 mL IVPB 2 g 200 mL/hr   08/05/20 1117 New Bag/Given   vancomycin (VANCOREADY) IVPB 750 mg/150 mL 750 mg 150 mL/hr       Subjective: Alert and following commands.   Objective: Vitals:   08/05/20 0341 08/05/20 0727 08/05/20 0820 08/05/20 1137  BP: (!) 92/58 104/72 95/71 108/73  Pulse: (!) 102 93 94 95  Resp:  20 18 19   Temp: 98.1 F (36.7 C) 98.4 F (36.9 C)  98.7 F (37.1 C)  TempSrc: Axillary Axillary  Axillary  SpO2: 100% 100% 100% 100%  Weight: 48.4 kg     Height:        Intake/Output Summary (Last 24 hours)  at 08/05/2020 1242 Last data filed at 08/05/2020 0729 Gross per 24 hour  Intake 815.66 ml  Output 750 ml  Net 65.66 ml   Filed Weights   08/02/20 0309 08/03/20 0613 08/05/20 0341  Weight: 46.4 kg 49.3 kg 48.4 kg    Examination:  General exam: Cachetic looking gentleman, no distress.  Respiratory system: air entry fiar, no wheezing or rhonchi.  Cardiovascular system: S1S2, tachycardic no jVD,no pedal edema.  Gastrointestinal system: Abdomen is soft, NT ND BS+ Central nervous system: Alert , following simple commands.  Extremities: no cyanosis.  Skin: No ulcers Psychiatry: Cannot be assessed    Data Reviewed: I have personally reviewed following labs and imaging studies  CBC: Recent Labs  Lab 08/01/20 0343 08/02/20 0354 08/03/20 0400 08/04/20 0420 08/05/20 0304  WBC 21.8* 19.8* 15.3*  15.1* 14.1*  HGB 9.4* 9.0* 9.4* 9.9* 10.0*  HCT 30.3* 29.5* 29.8* 31.2* 31.5*  MCV 96.5 98.3 94.9 94.5 94.9  PLT 408* 428* 448* 435* 406*    Basic Metabolic Panel: Recent Labs  Lab 08/01/20 0343 08/02/20 0354 08/03/20 0400 08/04/20 0420 08/05/20 0304  NA 146* 151* 150* 148* 145  K 3.9 3.7 3.9 3.6 3.8  CL 109 118* 121* 120* 118*  CO2 27 25 22 23 23   GLUCOSE 254* 253* 177* 200* 218*  BUN 30* 31* 29* 28* 26*  CREATININE 1.00 0.88 0.95 0.81 0.76  CALCIUM 8.4* 8.2* 8.1* 8.2* 8.1*  MG 2.4  --   --   --   --     GFR: Estimated Creatinine Clearance: 62.2 mL/min (by C-G formula based on SCr of 0.76 mg/dL).  Liver Function Tests: Recent Labs  Lab 07/30/20 0413 07/31/20 0334 08/01/20 0343 08/01/20 1440  AST 75* 55* 61*  --   ALT 102* 82* 81*  --   ALKPHOS 100 97 104  --   BILITOT 0.7 0.6 0.8  --   PROT 5.7* 5.8* 6.1* 6.0*  ALBUMIN 1.9* 1.9* 2.0*  --     CBG: Recent Labs  Lab 08/04/20 2052 08/05/20 0024 08/05/20 0339 08/05/20 0722 08/05/20 1125  GLUCAP 260* 141* 224* 126* 165*     Recent Results (from the past 240 hour(s))  Culture, Respiratory w Gram Stain      Status: None   Collection Time: 07/31/20  8:00 AM   Specimen: Tracheal Aspirate; Respiratory  Result Value Ref Range Status   Specimen Description TRACHEAL ASPIRATE  Final   Special Requests NONE  Final   Gram Stain   Final    FEW WBC PRESENT, PREDOMINANTLY MONONUCLEAR NO ORGANISMS SEEN    Culture   Final    NO GROWTH 2 DAYS Performed at [facility], [address], [place], [place]    Report Status 08/04/2020 FINAL  Final  Body fluid culture w Gram Stain     Status: None   Collection Time: 08/01/20  2:40 PM   Specimen: Fluid  Result Value Ref Range Status   Specimen Description FLUID PLEURAL  Final   Special Requests NONE  Final   Gram Stain   Final    FEW WBC PRESENT,BOTH PMN AND MONONUCLEAR NO ORGANISMS SEEN    Culture   Final    NO GROWTH 3 DAYS Performed at [facility], [address], [place], [place]    Report Status 08/05/2020 FINAL  Final         Radiology Studies: No results found.      Scheduled Meds: . amiodarone  200 mg Per Tube Daily  . aspirin  81 mg Per Tube Daily  . chlorhexidine  15 mL Mouth Rinse BID  . Chlorhexidine Gluconate Cloth  6 each Topical Daily  . clonazepam  0.5 mg Per Tube TID  . digoxin  0.125 mg Per Tube Daily  . enoxaparin (LOVENOX) injection  30 mg Subcutaneous Daily  . famotidine  20 mg Per Tube BID  . feeding supplement (PROSource TF)  45 mL Per Tube BID  . free water  100 mL Per Tube Q4H  . insulin aspart  0-15 Units Subcutaneous Q4H  . insulin aspart  3 Units Subcutaneous Q4H  . mouth rinse  15 mL Mouth Rinse q12n4p  . midodrine  5 mg Per Tube TID WC  . nicotine  14 mg Transdermal  Daily  . oxyCODONE  5 mg Per Tube Q8H  . rosuvastatin  40 mg Per Tube Daily  . sodium chloride flush  10-40 mL Intracatheter Q12H  . ticagrelor  90 mg Per Tube BID   Continuous Infusions: . sodium chloride Stopped (07/31/20 1941)  . sodium chloride 10 mL (08/04/20 2325)  . cefTAZidime (FORTAZ)  IV 2 g  (08/05/20 0118)  . feeding supplement (OSMOLITE 1.5 CAL) 1,000 mL (08/04/20 2041)  . vancomycin 750 mg (08/05/20 1117)     LOS: 15 days       Kathlen Mody, MD [facility]   To contact the attending provider between 7A-7P or the covering provider during after hours 7P-7A, please log into the web site [email or web address] and access using universal [place] password for that web site. If you do not have the password, please call the hospital operator.  08/05/2020, 12:42 PM

## 2020-08-05 NOTE — Progress Notes (Signed)
Patient continued to be impulsive and agitated throughout night.  Left message on son's voicemail in attempt to have someone stay with patient, no return call.  Attempted interpreter services and patient seemed to understand interpreter but unable to project voice for interpreter to understand patient.  Consulted pharmacy in attempt to consolidate medication times to decrease interruptions.  Medications currently scheduled every 2 hours.  Pt is turning himself in bed continuously.   Pt had 1 large loose BM 0645.  Shift report given to Vernona Rieger, [place]

## 2020-08-05 NOTE — Progress Notes (Signed)
Progress Note  Patient Name: Vincent Horton Date of Encounter: 08/05/2020  Primary Cardiologist: Advanced Heart Failure Team  Subjective   Patient somnolent this morning.  Inpatient Medications    Scheduled Meds: . amiodarone  200 mg Per Tube Daily  . aspirin  81 mg Per Tube Daily  . chlorhexidine  15 mL Mouth Rinse BID  . Chlorhexidine Gluconate Cloth  6 each Topical Daily  . clonazepam  0.5 mg Per Tube TID  . digoxin  0.125 mg Per Tube Daily  . enoxaparin (LOVENOX) injection  30 mg Subcutaneous Daily  . famotidine  20 mg Per Tube BID  . feeding supplement (PROSource TF)  45 mL Per Tube BID  . free water  100 mL Per Tube Q4H  . insulin aspart  0-15 Units Subcutaneous Q4H  . insulin aspart  3 Units Subcutaneous Q4H  . mouth rinse  15 mL Mouth Rinse q12n4p  . midodrine  5 mg Per Tube TID WC  . nicotine  14 mg Transdermal Daily  . oxyCODONE  5 mg Per Tube Q8H  . rosuvastatin  40 mg Per Tube Daily  . sodium chloride flush  10-40 mL Intracatheter Q12H  . ticagrelor  90 mg Per Tube BID   Continuous Infusions: . sodium chloride Stopped (07/31/20 1941)  . sodium chloride 10 mL (08/04/20 2325)  . cefTAZidime (FORTAZ)  IV 2 g (08/05/20 0118)  . feeding supplement (OSMOLITE 1.5 CAL) 1,000 mL (08/04/20 2041)  . vancomycin 750 mg (08/04/20 2331)   PRN Meds: Place/Maintain arterial line **AND** sodium chloride, acetaminophen (TYLENOL) oral liquid 160 mg/5 mL, guaiFENesin-dextromethorphan, haloperidol lactate, ipratropium-albuterol, loperamide HCl, ondansetron (ZOFRAN) IV, sodium chloride flush   Vital Signs    Vitals:   08/05/20 0034 08/05/20 0341 08/05/20 0727 08/05/20 0820  BP: 96/63 (!) 92/58 104/72 95/71  Pulse: 92 (!) 102 93 94  Resp: 17  20 18   Temp: 98.2 F (36.8 C) 98.1 F (36.7 C) 98.4 F (36.9 C)   TempSrc: Axillary Axillary Axillary   SpO2: 100% 100% 100% 100%  Weight:  48.4 kg    Height:        Intake/Output Summary (Last 24 hours) at 08/05/2020 0829 Last data  filed at 08/05/2020 0729 Gross per 24 hour  Intake 815.66 ml  Output 750 ml  Net 65.66 ml   Filed Weights   08/02/20 0309 08/03/20 0613 08/05/20 0341  Weight: 46.4 kg 49.3 kg 48.4 kg    Telemetry    Sinus rhythm.  Personally reviewed.  ECG    An ECG dated 08/01/2020 was personally reviewed today and demonstrated:  Sinus rhythm with anteroseptal infarct pattern.  Physical Exam   GEN:  Cachectic male, no acute distress.   HEENT: No JVD.  Feeding tube in place. Cardiac: RRR, no gallop.  Respiratory: Nonlabored.  Clear to auscultation anteriorly. GI: Soft, nontender, bowel sounds present. MS:  Muscle wasting.  No edema.  Labs    Chemistry Recent Labs  Lab 07/30/20 0413 07/31/20 0334 08/01/20 0343 08/01/20 1440 08/02/20 0354 08/03/20 0400 08/04/20 0420 08/05/20 0304  NA 142 144 146*  --    < > 150* 148* 145  K 4.3 4.1 3.9  --    < > 3.9 3.6 3.8  CL 109 109 109  --    < > 121* 120* 118*  CO2 27 29 27   --    < > 22 23 23   GLUCOSE 201* 167* 254*  --    < > 177*  200* 218*  BUN 31* 30* 30*  --    < > 29* 28* 26*  CREATININE 1.11 1.09 1.00  --    < > 0.95 0.81 0.76  CALCIUM 8.0* 8.1* 8.4*  --    < > 8.1* 8.2* 8.1*  PROT 5.7* 5.8* 6.1* 6.0*  --   --   --   --   ALBUMIN 1.9* 1.9* 2.0*  --   --   --   --   --   AST 75* 55* 61*  --   --   --   --   --   ALT 102* 82* 81*  --   --   --   --   --   ALKPHOS 100 97 104  --   --   --   --   --   BILITOT 0.7 0.6 0.8  --   --   --   --   --   GFRNONAA >60 >60 >60  --    < > >60 >60 >60  ANIONGAP 6 6 10   --    < > 7 5 4*   < > = values in this interval not displayed.     Hematology Recent Labs  Lab 08/03/20 0400 08/04/20 0420 08/05/20 0304  WBC 15.3* 15.1* 14.1*  RBC 3.14* 3.30* 3.32*  HGB 9.4* 9.9* 10.0*  HCT 29.8* 31.2* 31.5*  MCV 94.9 94.5 94.9  MCH 29.9 30.0 30.1  MCHC 31.5 31.7 31.7  RDW 15.9* 15.9* 15.7*  PLT 448* 435* 406*    Cardiac Enzymes Recent Labs  Lab 07/21/20 0109 07/21/20 0429  TROPONINIHS 2,747*  9,770*    Radiology    No results found.  Cardiac Studies   Cardiac catheterization 07/21/2020:  Prox LAD to Mid LAD lesion is 100% stenosed.  Post intervention, there is a 0% residual stenosis.  A drug-eluting stent was successfully placed using a SYNERGY XD 2.50X38.   1. 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 Successful PCI of the proximal to mid LAD with DES. Some distal embolization to the apical LAD 3. Normal right heart pressures mean PAP 20 mm Hg 4. Normal PCWP 12 mm Hg 5. Low cardiac output 2.7 L/min with index 1.73.  6. Low cardiac power 0.48.  Echocardiogram 07/21/2020: 1. Coarse apical trabeculation and calcified LV apical false tendon  (normal variant) -no mural thrombus. Left ventricular ejection fraction,  by estimation, is 30 to 35%. The left ventricle has moderately decreased  function. The left ventricle  demonstrates regional wall motion abnormalities (see scoring  diagram/findings for description). Left ventricular diastolic parameters  are consistent with Grade I diastolic dysfunction (impaired relaxation).  There is severe hypokinesis of the left  ventricular, entire anteroseptal wall, anterior wall, apical segment and  inferoapical segment.  2. Right ventricular systolic function is normal. The right ventricular  size is normal. There is normal pulmonary artery systolic pressure. The  estimated right ventricular systolic pressure is 29.6 mmHg.  3. The pericardial effusion is posterior to the left ventricle.  4. The mitral valve is abnormal. Trivial mitral valve regurgitation.  5. The aortic valve is tricuspid. Aortic valve regurgitation is not  visualized.  6. The inferior vena cava is normal in size with greater than 50%  respiratory variability, suggesting right atrial pressure of 3 mmHg.   Assessment & Plan    1.  CAD status post anterior STEMI managed with DES to the proximal LAD, complicated by distal embolization.  Continues on aspirin, Brilinta, and Crestor.  2.  Ischemic cardiomyopathy with cardiogenic shock, LVEF 30 to 35% range, RV contraction normal.  He has been weaned off milrinone.  Co-ox 59% today, was 69% yesterday.  Diuretics held with low CVP.  Otherwise on Lanoxin and ProAmatine for blood pressure support.  Systolics running 90s to low 100s.  3.  Status post cardiac arrest with VF in the setting of STEMI, 15 minutes of ACLS prior to ROSC.  Rhythm stable.  He is on amiodarone at this time.  4.  Delirium/metabolic encephalopathy.  Has feeding tube in place in light of dysphagia.  Still fairly tenuous.  Do not plan on resuming milrinone at this time although co-ox has decreased.  Continue aspirin, Brilinta, Crestor, amiodarone, Lanoxin, and ProAmatine.  Overall prognosis is poor.  Advanced heart failure team to resume follow-up tomorrow.  Further goals of care discussion suggested.  Signed, Nona Dell, MD  08/05/2020, 8:29 AM

## 2020-08-05 NOTE — Progress Notes (Addendum)
  Speech Language Pathology Treatment: Dysphagia  Patient Details Name: Geneva Pallas MRN: [id number] DOB: 06-12-1955 Today's Date: 08/05/2020 Time: 1345-1410 SLP Time Calculation (min) (ACUTE ONLY): 25 min  Assessment / Plan / Recommendation Clinical Impression  Patient seen for ST treatment to address dysphagia goals. Per RN, patient has been much more alert today and is requesting water frequently. Patient in bed but alert when SLP arrived, sitter present in room. He would occasionally verbalize request of water via very low volume voice as well as gestures. SLP performed extensive oral care which included removing a large amount of thick secretions adhered to soft palate and back of patient's tongue. Patient was able to follow verbal and modeling cues for oral care completion. Patient accepted a couple small ice chips and did exhibit minimal amount of attempts at mastication followed by swallow initiation. He then took sips of plain thin water from spoon followed by one small controlled cup sip of water. He exhibited immediate cough response after cup sip of water and then frequent coughing for several minutes after. SLP explained to patient need to stay NPO and continue with oral care and keeping mouth moist and he did nod in agreement. SLP is recommending objective swallow study (MBS versus FEES) prior to PO recommendations. SLP to follow patient for readiness for swallow testing.   HPI HPI: Pt is a 65 yo male found down by family VF arrest associated with STEMI, shocked 5x, 15 min CPR before ROSC achieved. ETT 5/21-5/31. CXR 5/31 with persistent bibasilar infiltrates/edema. PMH includes cardiac arrest, CAD, dysrhythmia, myocardial infarction. Per pt's son, pt's oral intake at home was limited to very soft and/or liquidised foods PTA due to condition of dentition.      SLP Plan  Continue with current plan of care;Other (Comment) (will need MBS prior to initiating PO's, SLP to determine readiness)        Recommendations  Diet recommendations: NPO Medication Administration: Via alternative means                Oral Care Recommendations: Oral care QID;Staff/trained caregiver to provide oral care Follow up Recommendations: Inpatient Rehab SLP Visit Diagnosis: Dysphagia, unspecified (R13.10) Plan: Continue with current plan of care;Other (Comment) (will need MBS prior to initiating PO's, SLP to determine readiness)       GO              Angela Nevin, MA, CCC-SLP Speech Therapy [facility]

## 2020-08-06 ENCOUNTER — Inpatient Hospital Stay (HOSPITAL_COMMUNITY): Payer: Medicare Other

## 2020-08-06 DIAGNOSIS — I5022 Chronic systolic (congestive) heart failure: Secondary | ICD-10-CM

## 2020-08-06 DIAGNOSIS — R4182 Altered mental status, unspecified: Secondary | ICD-10-CM

## 2020-08-06 DIAGNOSIS — Z7189 Other specified counseling: Secondary | ICD-10-CM

## 2020-08-06 DIAGNOSIS — Z515 Encounter for palliative care: Secondary | ICD-10-CM

## 2020-08-06 LAB — CBC WITH DIFFERENTIAL/PLATELET
Abs Immature Granulocytes: 0.07 10*3/uL (ref 0.00–0.07)
Basophils Absolute: 0 10*3/uL (ref 0.0–0.1)
Basophils Relative: 0 %
Eosinophils Absolute: 0.4 10*3/uL (ref 0.0–0.5)
Eosinophils Relative: 4 %
HCT: 33.4 % — ABNORMAL LOW (ref 39.0–52.0)
Hemoglobin: 10.5 g/dL — ABNORMAL LOW (ref 13.0–17.0)
Immature Granulocytes: 1 %
Lymphocytes Relative: 15 %
Lymphs Abs: 1.6 10*3/uL (ref 0.7–4.0)
MCH: 29.7 pg (ref 26.0–34.0)
MCHC: 31.4 g/dL (ref 30.0–36.0)
MCV: 94.6 fL (ref 80.0–100.0)
Monocytes Absolute: 0.6 10*3/uL (ref 0.1–1.0)
Monocytes Relative: 6 %
Neutro Abs: 8.2 10*3/uL — ABNORMAL HIGH (ref 1.7–7.7)
Neutrophils Relative %: 74 %
Platelets: 348 10*3/uL (ref 150–400)
RBC: 3.53 MIL/uL — ABNORMAL LOW (ref 4.22–5.81)
RDW: 15.8 % — ABNORMAL HIGH (ref 11.5–15.5)
WBC: 11 10*3/uL — ABNORMAL HIGH (ref 4.0–10.5)
nRBC: 0 % (ref 0.0–0.2)

## 2020-08-06 LAB — BASIC METABOLIC PANEL
Anion gap: 3 — ABNORMAL LOW (ref 5–15)
BUN: 21 mg/dL (ref 8–23)
CO2: 23 mmol/L (ref 22–32)
Calcium: 8 mg/dL — ABNORMAL LOW (ref 8.9–10.3)
Chloride: 117 mmol/L — ABNORMAL HIGH (ref 98–111)
Creatinine, Ser: 0.68 mg/dL (ref 0.61–1.24)
GFR, Estimated: >60 mL/min (ref >60)
Glucose, Bld: 222 mg/dL — ABNORMAL HIGH (ref 70–99)
Potassium: 3.8 mmol/L (ref 3.5–5.1)
Sodium: 143 mmol/L (ref 135–145)

## 2020-08-06 LAB — COOXEMETRY PANEL
Carboxyhemoglobin: 1.4 % (ref 0.5–1.5)
Methemoglobin: 0.9 % (ref 0.0–1.5)
O2 Saturation: 81.5 %
Total hemoglobin: 10.1 g/dL — ABNORMAL LOW (ref 12.0–16.0)

## 2020-08-06 LAB — GLUCOSE, CAPILLARY
Glucose-Capillary: 168 mg/dL — ABNORMAL HIGH (ref 70–99)
Glucose-Capillary: 215 mg/dL — ABNORMAL HIGH (ref 70–99)
Glucose-Capillary: 145 mg/dL — ABNORMAL HIGH (ref 70–99)
Glucose-Capillary: 113 mg/dL — ABNORMAL HIGH (ref 70–99)
Glucose-Capillary: 184 mg/dL — ABNORMAL HIGH (ref 70–99)
Glucose-Capillary: 117 mg/dL — ABNORMAL HIGH (ref 70–99)

## 2020-08-06 LAB — PROTIME-INR
INR: 1.1 (ref 0.8–1.2)
Prothrombin Time: 13.9 seconds (ref 11.4–15.2)

## 2020-08-06 MED ORDER — LORAZEPAM 2 MG/ML IJ SOLN: 1.0000 mg | Freq: Once | INTRAMUSCULAR | Status: DC

## 2020-08-06 MED ORDER — MIDODRINE HCL 5 MG PO TABS
2.5000 mg | ORAL_TABLET | Freq: Three times a day (TID) | ORAL | Status: DC
  Administered 2020-08-06: 2.5 mg
  Filled 2020-08-06 (×2): qty 1

## 2020-08-06 MED ORDER — CARVEDILOL 3.125 MG PO TABS
3.1250 mg | ORAL_TABLET | Freq: Two times a day (BID) | ORAL | Status: DC
  Administered 2020-08-06 – 2020-08-10 (×6): 3.125 mg
  Filled 2020-08-06 (×6): qty 1

## 2020-08-06 NOTE — Progress Notes (Addendum)
PROGRESS NOTE    Vincent Horton  WIO:[id number] DOB: May 05, 1955 DOA: 07/21/2020 PCP: No primary care provider on file.   Chief Complaint  Patient presents with  . Cardiac Arrest    Brief Narrative:    65 year old male with limited medical history found down by family, EMS was called, he was found to be in V. fib shocked 5 x 3 mg epinephrine 450 mg of amiodarone with ROSC obtained.  He was emergently taken to the Cath Lab showing ST segment changes in anterior lateral leads status post revascularization of LAD.  He was intubated on 07/21/2020 and transferred to the ICU. He was extubated on 07/31/2020 and transferred to [facility] on 08/03/2020.   Pt weaned off milrinone gtt. Patient seen and examined this am, very confused, lethargic, .  Remains on room air and appears comfortable  Assessment & Plan:   Principal Problem:   Acute ST elevation myocardial infarction (STEMI) due to occlusion of left anterior descending (LAD) coronary artery (HCC) Active Problems:   Cardiac arrest (HCC)   STEMI involving left anterior descending coronary artery (HCC)   Protein calorie malnutrition (HCC)   Acute hypoxemic respiratory failure (HCC)   Ischemic cardiomyopathy   Aspiration pneumonia (HCC)   Pulmonary edema cardiac cause (HCC)   Acute kidney injury (HCC)   Shock liver   Cardiogenic shock (HCC)   Transaminitis   Protein-calorie malnutrition, severe   Pleural effusion   Cardiac arrest with anterior STEMI with occluded proximal LAD  V. fib arrest associated with STEMI , patient shocked 5 times within 15 minutes of CPR prior to ROSC S/p STEMI post LAD stent. Continue with aspirin and Brillinta, Crestor. Cardiology on board, recommended to continue monitoring off milrinone. Overall prognosis is poor. Will get palliative care consult for goals of care.    Ischemic cardiomyopathy/cardiogenic shock Echocardiogram on 07/21/2020 showed a left ventricular ejection fraction of 30 to 35%, with grade 1 diastolic  dysfunction, no LV thrombus. Cardiology on board,  appreciate recommendations. Continue with aspirin, brillinta, Crestor.  Off diuretics for now.  Decreasing midodrine.   Acute respiratory failure with hypoxia probably secondary to healthcare associated pneumonia Complete 7 days of IV antibiotics as per PCCM. Patient on room air, no distress noted   Paroxysmal atrial fibrillation Currently sinus tachycardia. On amiodarone and digoxin   Anemia of chronic disease S/p 1 unit of PRBC transfusion on 07/27/2020 Hemoglobin stable between 9 to 10., continue to monitor.    Hypernatremia Probably secondary to free water deficit, poor oral intake.. Free water infusion via core track.  Recheck sodium levels in the morning show improvement to 142. Continue to monitor.   Leukocytosis probably from healthcare associated pneumonia. Improving.  Recheck CBC in am.    Acute metabolic encephalopathy?  Secondary to cardiac arrest, hypernatremia Initial CT of the head negative. EEG is negative for epileptiform activity. He was alert yesterday, but today, he is confused, lethargic on mittens and abdominal restraints. Unfortunately not making any progress, will get MRi brain without contrast to evaluate further. ? Anoxic brain injury.      Protein calorie malnutrition Dietary consulted and he is on core track/tube feeds.  SLP evaluation today with MBS, and see if he can swallow and take nutrition orally.   Mildly elevated liver enzymes:  - recheck liver panel in 4 to 6 weeks.      DVT prophylaxis: (Lovenox) Code Status: Full code Family Communication: None at bedside, discussed with son over the phone ON 08/04/20 Disposition:   Status  is: Inpatient  Remains inpatient appropriate because:Ongoing diagnostic testing needed not appropriate for outpatient work up and IV treatments appropriate due to intensity of illness or inability to take PO   Dispo: The patient is from: Home               Anticipated d/c is to: Pending              Patient currently is not medically stable to d/c.   Difficult to place patient No       Consultants:   Cardiology  PCCM  Procedures:  Cardiac catheterization Intubated and extubated on 5/31.   antimicrobials:  Antibiotics Given (last 72 hours)    Date/Time Action Medication Dose Rate   08/03/20 1253 New Bag/Given   cefTAZidime (FORTAZ) 2 g in sodium chloride 0.9 % 100 mL IVPB 2 g 200 mL/hr   08/03/20 1809 New Bag/Given   cefTAZidime (FORTAZ) 2 g in sodium chloride 0.9 % 100 mL IVPB 2 g 200 mL/hr   08/03/20 2333 New Bag/Given   vancomycin (VANCOREADY) IVPB 750 mg/150 mL 750 mg 150 mL/hr   08/04/20 0246 New Bag/Given   cefTAZidime (FORTAZ) 2 g in sodium chloride 0.9 % 100 mL IVPB 2 g 200 mL/hr   08/04/20 1001 New Bag/Given   cefTAZidime (FORTAZ) 2 g in sodium chloride 0.9 % 100 mL IVPB 2 g 200 mL/hr   08/04/20 1144 New Bag/Given   vancomycin (VANCOREADY) IVPB 750 mg/150 mL 750 mg 150 mL/hr   08/04/20 1818 New Bag/Given   cefTAZidime (FORTAZ) 2 g in sodium chloride 0.9 % 100 mL IVPB 2 g 200 mL/hr   08/04/20 2331 New Bag/Given   vancomycin (VANCOREADY) IVPB 750 mg/150 mL 750 mg 150 mL/hr   08/05/20 0118 New Bag/Given   cefTAZidime (FORTAZ) 2 g in sodium chloride 0.9 % 100 mL IVPB 2 g 200 mL/hr   08/05/20 1117 New Bag/Given   vancomycin (VANCOREADY) IVPB 750 mg/150 mL 750 mg 150 mL/hr   08/05/20 1529 New Bag/Given   cefTAZidime (FORTAZ) 2 g in sodium chloride 0.9 % 100 mL IVPB 2 g 200 mL/hr   08/05/20 2118 New Bag/Given   cefTAZidime (FORTAZ) 2 g in sodium chloride 0.9 % 100 mL IVPB 2 g 200 mL/hr   08/05/20 2151 New Bag/Given   vancomycin (VANCOREADY) IVPB 750 mg/150 mL 750 mg 150 mL/hr   08/06/20 0521 New Bag/Given   cefTAZidime (FORTAZ) 2 g in sodium chloride 0.9 % 100 mL IVPB 2 g 200 mL/hr   08/06/20 1042 New Bag/Given   vancomycin (VANCOREADY) IVPB 750 mg/150 mL 750 mg 150 mL/hr       Subjective: Confused, in mittens.    Objective: Vitals:   08/05/20 1815 08/05/20 2135 08/06/20 0350 08/06/20 0825  BP: 117/68 116/88 110/69 99/73  Pulse: 92 99 97 93  Resp:  20 17 18   Temp:  98.2 F (36.8 C) 97.8 F (36.6 C) 97.7 F (36.5 C)  TempSrc:  Axillary Axillary Axillary  SpO2: 100% 100% 100% 100%  Weight:   47 kg   Height:        Intake/Output Summary (Last 24 hours) at 08/06/2020 1126 Last data filed at 08/06/2020 1000 Gross per 24 hour  Intake 3731.22 ml  Output 2000 ml  Net 1731.22 ml   Filed Weights   08/03/20 0613 08/05/20 0341 08/06/20 0350  Weight: 49.3 kg 48.4 kg 47 kg    Examination:  General exam: Cachectic looking gentleman, not in any kind of distress  oN RA.  Respiratory system: Air entry fair bilateral no tachypnea no wheezing heard Cardiovascular system: S1-S2 heard, tachycardic, no JVD or pedal edema Gastrointestinal system: Abdomen is soft nontender bowel sounds normal Central nervous system: lethargic, not following commands able to move all extremities.  Extremities: No pedal edema Skin: No ulcers Psychiatry: cannot be assessed.     Data Reviewed: I have personally reviewed following labs and imaging studies  CBC: Recent Labs  Lab 08/01/20 0343 08/02/20 0354 08/03/20 0400 08/04/20 0420 08/05/20 0304  WBC 21.8* 19.8* 15.3* 15.1* 14.1*  HGB 9.4* 9.0* 9.4* 9.9* 10.0*  HCT 30.3* 29.5* 29.8* 31.2* 31.5*  MCV 96.5 98.3 94.9 94.5 94.9  PLT 408* 428* 448* 435* 406*    Basic Metabolic Panel: Recent Labs  Lab 08/01/20 0343 08/02/20 0354 08/03/20 0400 08/04/20 0420 08/05/20 0304 08/06/20 0422  NA 146* 151* 150* 148* 145 143  K 3.9 3.7 3.9 3.6 3.8 3.8  CL 109 118* 121* 120* 118* 117*  CO2 27 25 22 23 23 23   GLUCOSE 254* 253* 177* 200* 218* 222*  BUN 30* 31* 29* 28* 26* 21  CREATININE 1.00 0.88 0.95 0.81 0.76 0.68  CALCIUM 8.4* 8.2* 8.1* 8.2* 8.1* 8.0*  MG 2.4  --   --   --   --   --     GFR: Estimated Creatinine Clearance: 60.4 mL/min (by C-G formula based on  SCr of 0.68 mg/dL).  Liver Function Tests: Recent Labs  Lab 07/31/20 0334 08/01/20 0343 08/01/20 1440  AST 55* 61*  --   ALT 82* 81*  --   ALKPHOS 97 104  --   BILITOT 0.6 0.8  --   PROT 5.8* 6.1* 6.0*  ALBUMIN 1.9* 2.0*  --     CBG: Recent Labs  Lab 08/05/20 1530 08/05/20 2056 08/06/20 0103 08/06/20 0403 08/06/20 0823  GLUCAP 101* 193* 168* 215* 145*     Recent Results (from the past 240 hour(s))  Culture, Respiratory w Gram Stain     Status: None   Collection Time: 07/31/20  8:00 AM   Specimen: Tracheal Aspirate; Respiratory  Result Value Ref Range Status   Specimen Description TRACHEAL ASPIRATE  Final   Special Requests NONE  Final   Gram Stain   Final    FEW WBC PRESENT, PREDOMINANTLY MONONUCLEAR NO ORGANISMS SEEN    Culture   Final    NO GROWTH 2 DAYS Performed at [facility], [address], [place], [place]    Report Status 08/04/2020 FINAL  Final  Body fluid culture w Gram Stain     Status: None   Collection Time: 08/01/20  2:40 PM   Specimen: Fluid  Result Value Ref Range Status   Specimen Description FLUID PLEURAL  Final   Special Requests NONE  Final   Gram Stain   Final    FEW WBC PRESENT,BOTH PMN AND MONONUCLEAR NO ORGANISMS SEEN    Culture   Final    NO GROWTH 3 DAYS Performed at [facility], [address] [place], [place]    Report Status 08/05/2020 FINAL  Final         Radiology Studies: No results found.      Scheduled Meds: . amiodarone  200 mg Per Tube Daily  . aspirin  81 mg Per Tube Daily  . chlorhexidine  15 mL Mouth Rinse BID  . Chlorhexidine Gluconate Cloth  6 each Topical Daily  . clonazepam  0.5 mg  Per Tube TID  . digoxin  0.125 mg Per Tube Daily  . enoxaparin (LOVENOX) injection  30 mg Subcutaneous Daily  . famotidine  20 mg Per Tube BID  . feeding supplement (PROSource TF)  45 mL Per Tube BID  . free water  100 mL Per Tube Q4H  . insulin aspart  0-15 Units Subcutaneous  Q4H  . insulin aspart  3 Units Subcutaneous Q4H  . mouth rinse  15 mL Mouth Rinse q12n4p  . midodrine  5 mg Per Tube TID WC  . nicotine  14 mg Transdermal Daily  . oxyCODONE  5 mg Per Tube Q8H  . rosuvastatin  40 mg Per Tube Daily  . sodium chloride flush  10-40 mL Intracatheter Q12H  . ticagrelor  90 mg Per Tube BID   Continuous Infusions: . sodium chloride Stopped (08/06/20 0521)  . sodium chloride Stopped (08/05/20 1117)  . cefTAZidime (FORTAZ)  IV Stopped (08/06/20 0553)  . feeding supplement (OSMOLITE 1.5 CAL) 1,000 mL (08/05/20 2108)  . vancomycin 750 mg (08/06/20 1042)     LOS: 16 days       Kathlen Mody, MD [facility]   To contact the attending provider between 7A-7P or the covering provider during after hours 7P-7A, please log into the web site [email or web address] and access using universal [place] password for that web site. If you do not have the password, please call the hospital operator.  08/06/2020, 11:26 AM

## 2020-08-06 NOTE — Progress Notes (Addendum)
Patient ID: Vincent Horton, male   DOB: 1955/12/04, 65 y.o.   MRN: [id number]    Advanced Heart Failure Rounding Note  PCP-Cardiologist: None   Subjective:    Co-ox 82%, off milrinone.   Wt down another 3 lb. CVP low 2-3   Resting comfortably. No distress.    Objective:   Weight Range: 47 kg Body mass index is 17.79 kg/m.   Vital Signs:   Temp:  [97.8 F (36.6 C)-98.7 F (37.1 C)] 97.8 F (36.6 C) (06/06 0350) Pulse Rate:  [91-104] 97 (06/06 0350) Resp:  [17-20] 17 (06/06 0350) BP: (90-117)/(58-91) 110/69 (06/06 0350) SpO2:  [100 %] 100 % (06/06 0350) Weight:  [47 kg] 47 kg (06/06 0350) Last BM Date: 08/05/20  Weight change: Filed Weights   08/03/20 0613 08/05/20 0341 08/06/20 0350  Weight: 49.3 kg 48.4 kg 47 kg    Intake/Output:   Intake/Output Summary (Last 24 hours) at 08/06/2020 0758 Last data filed at 08/06/2020 0521 Gross per 24 hour  Intake 901.5 ml  Output 2000 ml  Net -1098.5 ml      Physical Exam   PHYSICAL EXAM: General:  Thin appearing. No respiratory difficulty + mittens  HEENT: normal Neck: supple. no JVD. Carotids 2+ bilat; no bruits. No lymphadenopathy or thyromegaly appreciated. Cor: PMI nondisplaced. Regular rate & rhythm. No rubs, gallops or murmurs. Lungs: clear Abdomen: soft, nontender, nondistended. No hepatosplenomegaly. No bruits or masses. Good bowel sounds. Extremities: no cyanosis, clubbing, rash, edema Neuro: cranial nerves grossly intact. moves all 4 extremities w/o difficulty. Affect pleasant.   Telemetry   NSR 90s   Labs    CBC Recent Labs    08/04/20 0420 08/05/20 0304  WBC 15.1* 14.1*  HGB 9.9* 10.0*  HCT 31.2* 31.5*  MCV 94.5 94.9  PLT 435* 406*   Basic Metabolic Panel Recent Labs    68/03/21 0304 08/06/20 0422  NA 145 143  K 3.8 3.8  CL 118* 117*  CO2 23 23  GLUCOSE 218* 222*  BUN 26* 21  CREATININE 0.76 0.68  CALCIUM 8.1* 8.0*   Liver Function Tests No results for input(s): AST, ALT, ALKPHOS,  BILITOT, PROT, ALBUMIN in the last 72 hours. No results for input(s): LIPASE, AMYLASE in the last 72 hours. Cardiac Enzymes No results for input(s): CKTOTAL, CKMB, CKMBINDEX, TROPONINI in the last 72 hours.  BNP: BNP (last 3 results) No results for input(s): BNP in the last 8760 hours.  ProBNP (last 3 results) No results for input(s): PROBNP in the last 8760 hours.   D-Dimer No results for input(s): DDIMER in the last 72 hours. Hemoglobin A1C No results for input(s): HGBA1C in the last 72 hours. Fasting Lipid Panel No results for input(s): CHOL, HDL, LDLCALC, TRIG, CHOLHDL, LDLDIRECT in the last 72 hours. Thyroid Function Tests No results for input(s): TSH, T4TOTAL, T3FREE, THYROIDAB in the last 72 hours.  Invalid input(s): FREET3  Other results:   Imaging    No results found.   Medications:     Scheduled Medications: . amiodarone  200 mg Per Tube Daily  . aspirin  81 mg Per Tube Daily  . chlorhexidine  15 mL Mouth Rinse BID  . Chlorhexidine Gluconate Cloth  6 each Topical Daily  . clonazepam  0.5 mg Per Tube TID  . digoxin  0.125 mg Per Tube Daily  . enoxaparin (LOVENOX) injection  30 mg Subcutaneous Daily  . famotidine  20 mg Per Tube BID  . feeding supplement (PROSource TF)  45  mL Per Tube BID  . free water  100 mL Per Tube Q4H  . insulin aspart  0-15 Units Subcutaneous Q4H  . insulin aspart  3 Units Subcutaneous Q4H  . mouth rinse  15 mL Mouth Rinse q12n4p  . midodrine  5 mg Per Tube TID WC  . nicotine  14 mg Transdermal Daily  . oxyCODONE  5 mg Per Tube Q8H  . rosuvastatin  40 mg Per Tube Daily  . sodium chloride flush  10-40 mL Intracatheter Q12H  . ticagrelor  90 mg Per Tube BID    Infusions: . sodium chloride 250 mL (08/06/20 0050)  . sodium chloride 10 mL (08/04/20 2325)  . cefTAZidime (FORTAZ)  IV 2 g (08/06/20 0521)  . feeding supplement (OSMOLITE 1.5 CAL) 1,000 mL (08/05/20 2108)  . vancomycin Stopped (08/05/20 2250)    PRN  Medications: Place/Maintain arterial line **AND** sodium chloride, acetaminophen (TYLENOL) oral liquid 160 mg/5 mL, guaiFENesin-dextromethorphan, haloperidol lactate, ipratropium-albuterol, loperamide HCl, ondansetron (ZOFRAN) IV, sodium chloride flush    Assessment/Plan   1. CAD: Anterior STEMI with occluded proximal LAD.  Treated with DES but complicated by embolization to distal LAD (procedure completed with occluded distal LAD).   - Noo chest pain.  - Continue ticagrelor and ASA 81.  - Crestor 40 daily. 2. Cardiogenic shock: Ischemic cardiomyopathy. Echo with EF 30-35%, no LV thrombus, LAD territory WMAs, RV normal, IVC normal.  - Co-ox 82% off milrinone  - Continue midodrine 5 mg tid, wean as tolerated  - CVP 2-3. Hold lasix again today  - Continue digoxin 0.125.  3. Acute hypoxemic respiratory failure: Now extubated.  Suspect HCAP. WBCs continues to come down. Finished Zosyn 5/29, started vancomycin/ceftazidime 5/31, continue 7 days total.  Cultures NGTD.  4. Cardiac arrest: VF arrest associated with STEMI. Patient shocked x 5, 15 min CPR before ROSC.  No further VT.  Now rewarmed s/p hypothermia protocol.  He has been revascularized.  - Will need Lifevest at discharge .   5. Anemia: Transfused 1 U on 5/27, Hgb up to 10 6. Paroxysmal Atrial fibrillation: We have not seen any actual atrial fibrillation, has had ST with PACs.  Currently NSR - Lovenox prophylaxis - Continue amiodarone to 200 mg daily.  Can eventually stop this and replace with Coreg.  7. Rt Pleural Effusion: s/p thoracentesis 6/1 w/ 450 cc fluid removal. F/u CXR 6/2 w/o effusion.  8. Delirium 9. Hypernatremia: Na 143 Per primary team.   10. Dysphagia NPO. Speech following.   Robbie Lis, PA-C 08/06/2020 7:58 AM  Patient seen with PA, agree with the above note.   Still with delirium.  Interpreter unable to understand him so difficult to assess.  Family has been coming in.   SBP 100s generally with CVP 2-3.   Good co-ox.  Remains on midodrine.   General: NAD Neck: No JVD, no thyromegaly or thyroid nodule.  Lungs: Clear to auscultation bilaterally with normal respiratory effort. CV: Nondisplaced PMI.  Heart regular S1/S2, no S3/S4, no murmur.  No peripheral edema.   Abdomen: Soft, nontender, no hepatosplenomegaly, no distention.  Skin: Intact without lesions or rashes.  Neurologic: Confused. Psych: Normal affect. Extremities: No clubbing or cyanosis.  HEENT: Normal.   Decrease midodrine to 2.5 tid today.  Continue digoxin.   I will stop amiodarone today and start Coreg 3.125 mg bid.   Does not need Lasix with low CVP.   Still with dysphagia, needs MBS.    Marca Ancona 08/06/2020 8:32 AM

## 2020-08-06 NOTE — Consult Note (Signed)
Consultation Note Date: 08/06/2020   Patient Name: Vincent Horton  DOB: 12-18-1955  MRN: [id number]  Age / Sex: 65 y.o., male  PCP: No primary care provider on file. Referring Physician: Kathlen Mody, MD  Reason for Consultation: Establishing goals of care and Psychosocial/spiritual support  HPI/Patient Profile:   65 year old male with limited medical history found down by family, EMS was called, he was found to be in V. fib shocked 5 x 3 mg epinephrine 450 mg of amiodarone with ROSC obtained. He was emergently taken to the Cath Lab showing ST segment changes in anterior lateral leads status post revascularization of LAD.  He was intubated on 07/21/2020 andtransferred to the ICU. He was extubated successfully  on 07/31/2020    Today is day 16 of this hospitalization.  Patient remains weak with severe dysphagia, currently core track in place for nutritional and hydration support.  He is intermittently confused.  Family face treatment option decisions, advanced directive decisions and anticipatory care needs.   Clinical Assessment and Goals of Care:   This NP Lorinda Creed reviewed medical records, received report from team, assessed the patient and then spoke to his Vincent/ Emeterio Reeve by phone to discuss diagnosis, prognosis, GOC,  disposition and options.   Concept of Palliative Care was introduced as specialized medical care for people and their families living with serious illness.  If focuses on providing relief from the symptoms and stress of a serious illness.  The goal is to improve quality of life for both the patient and the family.   Values and goals of care important to patient and family were attempted to be elicited.  Created space and opportunity for family to explore thoughts and feelings regarding current medical situation.  He tells me he is somewhat surprised by today's conversation and that what he  "has been hearing that there is improvement".   A  discussion was had today regarding advanced directives.  Concepts specific to code status, artifical feeding and hydration, continued IV antibiotics and rehospitalization was had.    Education offered on the process of PEG tube placement and the risks and benefits of artificial feeding and hydration.  Questions and concerns addressed.    Plan is to speak to daughter and set a follow-up meeting in the next few days.   PMT will continue to support holistically.        No documented H POA or advanced care planning documents.  Patient has 10 living children, no spouse.    SUMMARY OF RECOMMENDATIONS    Code Status/Advance Care Planning:  Full code    Symptom Management:   Per attending  Palliative Prophylaxis:   Aspiration, Bowel Regimen, Delirium Protocol, Frequent Pain Assessment and Oral Care  Additional Recommendations (Limitations, Scope, Preferences):  Full Scope Treatment  Psycho-social/Spiritual:   Desire for further Chaplaincy support:no-family has reached out to Imam   Prognosis:   Unable to determine  Discharge Planning: To Be Determined      Primary Diagnoses: Present on Admission: . Acute ST elevation myocardial infarction (STEMI) due to occlusion of left anterior descending (LAD) coronary artery (HCC) . Cardiac arrest (HCC) . STEMI involving left anterior descending coronary artery (HCC)   I have reviewed the medical record, interviewed the patient and family, and examined the patient. The following aspects are pertinent.  Past Medical History:  Diagnosis Date  . Cardiac arrest (HCC)   . Coronary artery disease   . Dysrhythmia   . Myocardial infarction [facility])    Social  History   Socioeconomic History  . Marital status: Legally Separated    Spouse name: Not on file  . Number of children: Not on file  . Years of education: Not on file  . Highest education level: Not on file  Occupational  History  . Not on file  Tobacco Use  . Smoking status: Current Every Day Smoker  . Smokeless tobacco: Never Used  Vaping Use  . Vaping Use: Unknown  Substance and Sexual Activity  . Alcohol use: Not Currently  . Drug use: Not Currently  . Sexual activity: Not on file  Other Topics Concern  . Not on file  Social History Narrative  . Not on file   Social Determinants of Health   Financial Resource Strain: Not on file  Food Insecurity: Not on file  Transportation Needs: Not on file  Physical Activity: Not on file  Stress: Not on file  Social Connections: Not on file   History reviewed. No pertinent family history. Scheduled Meds: . aspirin  81 mg Per Tube Daily  . carvedilol  3.125 mg Per Tube BID WC  . chlorhexidine  15 mL Mouth Rinse BID  . Chlorhexidine Gluconate Cloth  6 each Topical Daily  . clonazepam  0.5 mg Per Tube TID  . digoxin  0.125 mg Per Tube Daily  . enoxaparin (LOVENOX) injection  30 mg Subcutaneous Daily  . famotidine  20 mg Per Tube BID  . feeding supplement (PROSource TF)  45 mL Per Tube BID  . free water  100 mL Per Tube Q4H  . insulin aspart  0-15 Units Subcutaneous Q4H  . insulin aspart  3 Units Subcutaneous Q4H  . mouth rinse  15 mL Mouth Rinse q12n4p  . midodrine  2.5 mg Per Tube TID WC  . nicotine  14 mg Transdermal Daily  . oxyCODONE  5 mg Per Tube Q8H  . rosuvastatin  40 mg Per Tube Daily  . sodium chloride flush  10-40 mL Intracatheter Q12H  . ticagrelor  90 mg Per Tube BID   Continuous Infusions: . sodium chloride Stopped (08/06/20 0521)  . sodium chloride Stopped (08/05/20 1117)  . feeding supplement (OSMOLITE 1.5 CAL) 1,000 mL (08/05/20 2108)  . vancomycin Stopped (08/06/20 1142)   PRN Meds:.Place/Maintain arterial line **AND** sodium chloride, acetaminophen (TYLENOL) oral liquid 160 mg/5 mL, guaiFENesin-dextromethorphan, haloperidol lactate, ipratropium-albuterol, loperamide HCl, ondansetron (ZOFRAN) IV, sodium chloride  flush Medications Prior to Admission:  Prior to Admission medications   Not on File   No Known Allergies Review of Systems  Unable to perform ROS: Mental status change    Physical Exam Constitutional:      Appearance: He is cachectic. He is ill-appearing.     Interventions: Nasal cannula in place.  Cardiovascular:     Rate and Rhythm: Normal rate.  Skin:    General: Skin is warm and dry.  Neurological:     Mental Status: He is lethargic.     Vital Signs: BP 91/61 (BP Location: Left Arm)   Pulse 89   Temp 97.7 F (36.5 C) (Axillary)   Resp 17   Ht 5\' 4"  (1.626 m)   Wt 47 kg   SpO2 100%   BMI 17.79 kg/m  Pain Scale: Faces POSS *See Group Information*: S-Acceptable,Sleep, easy to arouse Pain Score: 0-No pain   SpO2: SpO2: 100 % O2 Device:SpO2: 100 % O2 Flow Rate: .O2 Flow Rate (L/min): 1 L/min  IO: Intake/output summary:   Intake/Output Summary (Last 24 hours) at  08/06/2020 1503 Last data filed at 08/06/2020 1300 Gross per 24 hour  Intake 4116.22 ml  Output 1400 ml  Net 2716.22 ml    LBM: Last BM Date: 08/05/20 Baseline Weight: Weight: 55 kg (estimated weight) Most recent weight: Weight: 47 kg     Palliative Assessment/Data:  30 % at best   Discussed with Dr. Blake Divine  Time In: 1500 Time Out: 1610 Time Total: 70 minutes Greater than 50%  of this time was spent counseling and coordinating care related to the above assessment and plan.  Signed by: Lorinda Creed, NP   Please contact Palliative Medicine Team phone at [phone] for questions and concerns.  For individual provider: See Loretha Stapler

## 2020-08-06 NOTE — TOC Initial Note (Addendum)
Transition of Care (TOC) - Initial/Assessment Note  Heart Failure  Patient Details  Name: Vincent Horton MRN: [id number] Date of Birth: [birth year]  Transition of Care [facility]) CM/SW Contact:    Cordel Drewes, LCSWA Phone Number: 08/06/2020, 3:20 PM  Clinical Narrative:                 CSW received consult for possible SNF placement at time of discharge. CSW spoke with patient's son Vincent Horton [phone]. Patient's son reported that patient lives with his 3 daughters who are currently unable to care for patient at their home given patient's current physical needs and fall risk. Patient's son expressed understanding of PT recommendation and is agreeable to SNF placement at time of discharge. Patient's son reports preference for SNF in [place], [place]. CSW discussed insurance authorization process and provided Medicare SNF ratings list. Patient has not received the COVID vaccines. Patient's son expressed being hopeful for rehab and to feel better soon. No further questions reported at this time. CSW must wait for the patient's cortrak to be removed before sending clinicals to SNF's.  TOC will continue to follow for discharge needs.  Expected Discharge Plan: Skilled Nursing Facility Barriers to Discharge: Continued Medical Work up   Patient Goals and CMS Choice        Expected Discharge Plan and Services Expected Discharge Plan: Skilled Nursing Facility In-house Referral: Clinical Social Work                                            Prior Living Arrangements/Services                       Activities of Daily Living Home Assistive Devices/Equipment: None ADL Screening (condition at time of admission) Patient's cognitive ability adequate to safely complete daily activities?: Yes Is the patient deaf or have difficulty hearing?: No Does the patient have difficulty seeing, even when wearing glasses/contacts?: No Does the patient have difficulty concentrating, remembering, or  making decisions?: No Patient able to express need for assistance with ADLs?: Yes Does the patient have difficulty dressing or bathing?: No Independently performs ADLs?: Yes (appropriate for developmental age) Does the patient have difficulty walking or climbing stairs?: No Weakness of Legs: None Weakness of Arms/Hands: None  Permission Sought/Granted                  Emotional Assessment Appearance:: Appears stated age Attitude/Demeanor/Rapport: Unable to Assess (patient on vent) Affect (typically observed): Unable to Assess (patient on vent)     Psych Involvement: No (comment)  Admission diagnosis:  STEMI involving left anterior descending coronary artery (HCC) [I21.02] Cardiac arrest [facility]) [I46.9] Patient Active Problem List   Diagnosis Date Noted  . Pleural effusion   . Protein-calorie malnutrition, severe 07/25/2020  . Acute hypoxemic respiratory failure (HCC) 07/24/2020  . Ischemic cardiomyopathy 07/24/2020  . Aspiration pneumonia (HCC) 07/24/2020  . Pulmonary edema cardiac cause (HCC) 07/24/2020  . Acute kidney injury (HCC) 07/24/2020  . Shock liver 07/24/2020  . Cardiogenic shock (HCC) 07/24/2020  . Transaminitis 07/24/2020  . Protein calorie malnutrition (HCC) 07/23/2020  . Acute ST elevation myocardial infarction (STEMI) due to occlusion of left anterior descending (LAD) coronary artery (HCC) 07/21/2020  . Cardiac arrest (HCC) 07/21/2020  . STEMI involving left anterior descending coronary artery (HCC) 07/21/2020   PCP:  No primary care provider on file. Pharmacy:  [id number] [place], [place] - [address] AT [facility] [address] [place] [address] Phone: [phone] Fax: [phone]     Social Determinants of Health (SDOH) Interventions Housing Interventions: Intervention Not Indicated  Readmission Risk Interventions No flowsheet data found.   Davidlee Jeanbaptiste, MSW, LCSWA [phone] Heart  Failure Social Worker

## 2020-08-06 NOTE — Progress Notes (Signed)
Inpatient Rehab Admissions Coordinator:   Note therapy recommendations updated to SNF.  Will sign off for CIR at this time.   Estill Dooms, PT, DPT Admissions Coordinator [phone] 08/06/20  9:38 AM

## 2020-08-06 NOTE — Progress Notes (Signed)
  Speech Language Pathology Treatment: Dysphagia  Patient Details Name: Vincent Horton MRN: [id number] DOB: [birth year] Today's Date: 08/06/2020 Time: 6659-9357 SLP Time Calculation (min) (ACUTE ONLY): 22 min  Assessment / Plan / Recommendation Clinical Impression  Pt is very alert today, but also restless and distractible. Video interpreter Osborne Casco [phone]) was utilized throughout session and although it is still hard to understand him, his is actually producing phonation today. Although still very dysphonic, it is just above a whisper today and the interpreter can understand a few single words at times. Pt has less over oral deficits today, but continues to have a wet vocal quality, delayed/weak cough, and minimal cough to command (at times not making attempts despite cues). Although he is likely to not be ready for a PO diet still, recommend proceeding with instrumental testing to better assess oropharyngeal swallowing and potential needs to direct tx. MBS is tentatively scheduled for this afternoon.    HPI HPI: Pt is a 65 yo male found down by family VF arrest associated with STEMI, shocked 5x, 15 min CPR before ROSC achieved. ETT 5/21-5/31. CXR 5/31 with persistent bibasilar infiltrates/edema. PMH includes cardiac arrest, CAD, dysrhythmia, myocardial infarction. Per pt's son, pt's oral intake at home was limited to very soft and/or liquidised foods PTA due to condition of dentition.      SLP Plan  MBS       Recommendations  Diet recommendations: NPO Medication Administration: Via alternative means                Oral Care Recommendations: Oral care QID Follow up Recommendations: Inpatient Rehab SLP Visit Diagnosis: Dysphagia, unspecified (R13.10) Plan: MBS       GO                Mahala Menghini., M.A. CCC-SLP Acute Rehabilitation Services Pager [phone] Office [phone]  08/06/2020, 10:19 AM

## 2020-08-06 NOTE — Progress Notes (Signed)
Modified Barium Swallow Progress Note  Patient Details  Name: Vincent Horton MRN: [id number] Date of Birth: 1955/10/20  Today's Date: 08/06/2020  Modified Barium Swallow completed.  Full report located under Chart Review in the Imaging Section.  Brief recommendations include the following:  Clinical Impression  Pt has a severe oropharyngeal dysphagia that is likely multifactorial in the setting of decreased strength and sensation s/p intubation and overall deconditioning, further impacted by reduced oral hygiene/presence of secretions and cognitive impairment. Video interpreter Linna Caprice [phone]) was used throughout this study. Pt has weak lingual manipulation with slow, repetitive movements used to work boluses back toward his pharynx but incompletely clearing his oral cavity. Pharyngeally he has reduced base of tongue retraction, hyolaryngeal movement, pharyngeal squeeze, and epiglottic inversion, leading to impaired airway closure and reduced pharyngeal clearance. Thin and nectar thick liquids are silently aspirated during the swallow, and all consistencies tested, including purees, are silently aspirated after the swallow due to pharyngeal residue that also appears to mix with secretions. Yankauer was used to clear his oral cavity as much as possible. He cannot produce enough strength to clear residuals despite cues for multiple hard swallows or chin tuck. He also cannot produce a cough hard enough to clear any aspirates or anything that remained in his laryngeal vestibule. Recommend that he continue to remain NPO with strong focus on oral care. Given mentation and severity of deficits, pt's recovery may take a longer amount of time, and he therefore may benefit from longer-term alternative means of nutrition if nearing readiness for discharge otherwise.   Swallow Evaluation Recommendations       SLP Diet Recommendations: NPO;Alternative means - temporary (may need longer term alternative means of  nutrition if not able to go to CIR)       Medication Administration: Via alternative means       Oral Care Recommendations: Oral care QID   Other Recommendations: Have oral suction available    Mahala Menghini., M.A. CCC-SLP Acute Rehabilitation Services Pager [phone] Office [phone]  08/06/2020,4:04 PM

## 2020-08-06 NOTE — Care Management Important Message (Signed)
Important Message  Patient Details  Name: Vincent Horton MRN: [id number] Date of Birth: [birth year]   Medicare Important Message Given:  Yes     Renie Ora 08/06/2020, 11:02 AM

## 2020-08-07 ENCOUNTER — Inpatient Hospital Stay (HOSPITAL_COMMUNITY): Payer: Medicare Other

## 2020-08-07 DIAGNOSIS — R531 Weakness: Secondary | ICD-10-CM

## 2020-08-07 DIAGNOSIS — R1310 Dysphagia, unspecified: Secondary | ICD-10-CM

## 2020-08-07 LAB — COOXEMETRY PANEL
Carboxyhemoglobin: 1.3 % (ref 0.5–1.5)
Methemoglobin: 0.8 % (ref 0.0–1.5)
O2 Saturation: 59.4 %
Total hemoglobin: 10.9 g/dL — ABNORMAL LOW (ref 12.0–16.0)

## 2020-08-07 LAB — GLUCOSE, CAPILLARY
Glucose-Capillary: 126 mg/dL — ABNORMAL HIGH (ref 70–99)
Glucose-Capillary: 127 mg/dL — ABNORMAL HIGH (ref 70–99)
Glucose-Capillary: 162 mg/dL — ABNORMAL HIGH (ref 70–99)
Glucose-Capillary: 164 mg/dL — ABNORMAL HIGH (ref 70–99)
Glucose-Capillary: 81 mg/dL (ref 70–99)
Glucose-Capillary: 89 mg/dL (ref 70–99)

## 2020-08-07 LAB — BASIC METABOLIC PANEL
Anion gap: 7 (ref 5–15)
BUN: 24 mg/dL — ABNORMAL HIGH (ref 8–23)
CO2: 23 mmol/L (ref 22–32)
Calcium: 8.3 mg/dL — ABNORMAL LOW (ref 8.9–10.3)
Chloride: 110 mmol/L (ref 98–111)
Creatinine, Ser: 0.6 mg/dL — ABNORMAL LOW (ref 0.61–1.24)
GFR, Estimated: >60 mL/min (ref >60)
Glucose, Bld: 125 mg/dL — ABNORMAL HIGH (ref 70–99)
Potassium: 4.2 mmol/L (ref 3.5–5.1)
Sodium: 140 mmol/L (ref 135–145)

## 2020-08-07 LAB — DIGOXIN LEVEL: Digoxin Level: 0.3 ng/mL — ABNORMAL LOW (ref 0.8–2.0)

## 2020-08-07 MED ORDER — DAPAGLIFLOZIN PROPANEDIOL 10 MG PO TABS
10.0000 mg | ORAL_TABLET | Freq: Every day | ORAL | Status: DC
  Filled 2020-08-07: qty 1

## 2020-08-07 MED ORDER — SODIUM BICARBONATE 650 MG PO TABS
650.0000 mg | ORAL_TABLET | Freq: Once | ORAL | Status: AC
Start: 2020-08-07 — End: 2020-08-07
  Administered 2020-08-07: 650 mg
  Filled 2020-08-07: qty 1

## 2020-08-07 MED ORDER — PANCRELIPASE (LIP-PROT-AMYL) 10440-39150 UNITS PO TABS
20880.0000 [IU] | ORAL_TABLET | Freq: Once | ORAL | Status: AC
  Administered 2020-08-07: 20880 [IU]
  Filled 2020-08-07: qty 2

## 2020-08-07 NOTE — Progress Notes (Addendum)
Patient ID: Vincent Horton, male   DOB: [birth year], 65 y.o.   MRN: [id number] P    Advanced Heart Failure Rounding Note  PCP-Cardiologist: None   Subjective:    MBS completed yesterday. + Severe oropharyngeal dysphagia. Remains high aspiration risk. To continue NPO per SLP. Has Cortrak for TFs    Co-ox 59% today off milrinone. Wt up 4 lb but CVP remains low 2-3. SBPs 90s-low 100s.   Calm this morning. No distress/ agitation. Sitter at bedside.   Objective:   Weight Range: 48.7 kg Body mass index is 18.43 kg/m.   Vital Signs:   Temp:  [97.5 F (36.4 C)-97.8 F (36.6 C)] 97.5 F (36.4 C) (06/07 0743) Pulse Rate:  [84-93] 89 (06/07 0743) Resp:  [16-18] 18 (06/07 0743) BP: (91-132)/(58-89) 108/81 (06/07 0743) SpO2:  [100 %] 100 % (06/07 0743) Weight:  [48.7 kg] 48.7 kg (06/07 0356) Last BM Date: 08/05/20  Weight change: Filed Weights   08/05/20 0341 08/06/20 0350 08/07/20 0356  Weight: 48.4 kg 47 kg 48.7 kg    Intake/Output:   Intake/Output Summary (Last 24 hours) at 08/07/2020 0748 Last data filed at 08/06/2020 1600 Gross per 24 hour  Intake 755 ml  Output --  Net 755 ml      Physical Exam   PHYSICAL EXAM: General:  Thin/frail appearing male, looks older than actual age. No respiratory difficulty + mittens  HEENT: normal + Cortrak  Neck: supple. no JVD. Carotids 2+ bilat; no bruits. No lymphadenopathy or thyromegaly appreciated. Cor: PMI nondisplaced. Regular rate & rhythm. No rubs, gallops or murmurs. Lungs: clear Abdomen: soft, nontender, nondistended. No hepatosplenomegaly. No bruits or masses. Good bowel sounds. Extremities: no cyanosis, clubbing, rash, edema Neuro: cranial nerves grossly intact. moves all 4 extremities w/o difficulty. Affect pleasant.   Telemetry   NSR 80s   Labs    CBC Recent Labs    08/05/20 0304 08/06/20 2044  WBC 14.1* 11.0*  NEUTROABS  --  8.2*  HGB 10.0* 10.5*  HCT 31.5* 33.4*  MCV 94.9 94.6  PLT 406* 348   Basic Metabolic  Panel Recent Labs    08/06/20 0422 08/07/20 0403  NA 143 140  K 3.8 4.2  CL 117* 110  CO2 23 23  GLUCOSE 222* 125*  BUN 21 24*  CREATININE 0.68 0.60*  CALCIUM 8.0* 8.3*   Liver Function Tests No results for input(s): AST, ALT, ALKPHOS, BILITOT, PROT, ALBUMIN in the last 72 hours. No results for input(s): LIPASE, AMYLASE in the last 72 hours. Cardiac Enzymes No results for input(s): CKTOTAL, CKMB, CKMBINDEX, TROPONINI in the last 72 hours.  BNP: BNP (last 3 results) No results for input(s): BNP in the last 8760 hours.  ProBNP (last 3 results) No results for input(s): PROBNP in the last 8760 hours.   D-Dimer No results for input(s): DDIMER in the last 72 hours. Hemoglobin A1C No results for input(s): HGBA1C in the last 72 hours. Fasting Lipid Panel No results for input(s): CHOL, HDL, LDLCALC, TRIG, CHOLHDL, LDLDIRECT in the last 72 hours. Thyroid Function Tests No results for input(s): TSH, T4TOTAL, T3FREE, THYROIDAB in the last 72 hours.  Invalid input(s): FREET3  Other results:   Imaging    DG Swallowing Func-Speech Pathology  Result Date: 08/06/2020 Objective Swallowing Evaluation: Type of Study: MBS-Modified Barium Swallow Study  Patient Details Name: Vincent Horton MRN: [id number] Date of Birth: 01-13-1956 Today's Date: 08/06/2020 Time: SLP Start Time (ACUTE ONLY): 1303 -SLP Stop Time (ACUTE ONLY): 1325 SLP Time Calculation (  min) (ACUTE ONLY): 22 min Past Medical History: Past Medical History: Diagnosis Date . Cardiac arrest (HCC)  . Coronary artery disease  . Dysrhythmia  . Myocardial infarction [facility])  Past Surgical History: Past Surgical History: Procedure Laterality Date . CARDIAC CATHETERIZATION   . CORONARY/GRAFT ACUTE MI REVASCULARIZATION N/A 07/21/2020  Procedure: Coronary/Graft Acute MI Revascularization;  Surgeon: Swaziland, Peter M, MD;  Location: [facility] INVASIVE CV LAB;  Service: Cardiovascular;  Laterality: N/A; . LEFT HEART CATH AND CORONARY ANGIOGRAPHY N/A 07/21/2020   Procedure: LEFT HEART CATH AND CORONARY ANGIOGRAPHY;  Surgeon: Swaziland, Peter M, MD;  Location: [facility] INVASIVE CV LAB;  Service: Cardiovascular;  Laterality: N/A; . RIGHT HEART CATH N/A 07/21/2020  Procedure: RIGHT HEART CATH;  Surgeon: Swaziland, Peter M, MD;  Location: [facility] INVASIVE CV LAB;  Service: Cardiovascular;  Laterality: N/A; HPI: Pt is a 65 yo male found down by family VF arrest associated with STEMI, shocked 5x, 15 min CPR before ROSC achieved. ETT 5/21-5/31. CXR 5/31 with persistent bibasilar infiltrates/edema. PMH includes cardiac arrest, CAD, dysrhythmia, myocardial infarction. Per pt's son, pt's oral intake at home was limited to very soft and/or liquidised foods PTA due to condition of dentition.  Subjective: pt alert and cooperative, needing repetition of instructions Assessment / Plan / Recommendation CHL IP CLINICAL IMPRESSIONS 08/06/2020 Clinical Impression Pt has a severe oropharyngeal dysphagia that is likely multifactorial in the setting of decreased strength and sensation s/p intubation and overall deconditioning, further impacted by reduced oral hygiene/presence of secretions and cognitive impairment. Video interpreter Linna Caprice [phone]) was used throughout this study. Pt has weak lingual manipulation with slow, repetitive movements used to work boluses back toward his pharynx but incompletely clearing his oral cavity. Pharyngeally he has reduced base of tongue retraction, hyolaryngeal movement, pharyngeal squeeze, and epiglottic inversion, leading to impaired airway closure and reduced pharyngeal clearance. Thin and nectar thick liquids are silently aspirated during the swallow, and all consistencies tested, including purees, are silently aspirated after the swallow due to pharyngeal residue that also appears to mix with secretions. Yankauer was used to clear his oral cavity as much as possible. He cannot produce enough strength to clear residuals despite cues for multiple hard swallows or chin tuck. He  also cannot produce a cough hard enough to clear any aspirates or anything that remained in his laryngeal vestibule. Recommend that he continue to remain NPO with strong focus on oral care. Given mentation and severity of deficits, pt's recovery may take a longer amount of time, and he therefore may benefit from longer-term alternative means of nutrition if nearing readiness for discharge otherwise. SLP Visit Diagnosis Dysphagia, oropharyngeal phase (R13.12) Attention and concentration deficit following -- Frontal lobe and executive function deficit following -- Impact on safety and function Severe aspiration risk;Risk for inadequate nutrition/hydration   CHL IP TREATMENT RECOMMENDATION 08/06/2020 Treatment Recommendations Therapy as outlined in treatment plan below   Prognosis 08/06/2020 Prognosis for Safe Diet Advancement Good Barriers to Reach Goals Cognitive deficits;Severity of deficits Barriers/Prognosis Comment -- CHL IP DIET RECOMMENDATION 08/06/2020 SLP Diet Recommendations NPO;Alternative means - temporary Liquid Administration via -- Medication Administration Via alternative means Compensations -- Postural Changes --   CHL IP OTHER RECOMMENDATIONS 08/06/2020 Recommended Consults -- Oral Care Recommendations Oral care QID Other Recommendations Have oral suction available   CHL IP FOLLOW UP RECOMMENDATIONS 08/06/2020 Follow up Recommendations Inpatient Rehab   CHL IP FREQUENCY AND DURATION 08/06/2020 Speech Therapy Frequency (ACUTE ONLY) min 2x/week Treatment Duration 2 weeks      CHL IP  ORAL PHASE 08/06/2020 Oral Phase Impaired Oral - Pudding Teaspoon -- Oral - Pudding Cup -- Oral - Honey Teaspoon -- Oral - Honey Cup -- Oral - Nectar Teaspoon Weak lingual manipulation;Reduced posterior propulsion;Lingual/palatal residue;Delayed oral transit;Decreased bolus cohesion Oral - Nectar Cup -- Oral - Nectar Straw -- Oral - Thin Teaspoon Weak lingual manipulation;Reduced posterior propulsion;Lingual/palatal residue;Delayed oral  transit;Decreased bolus cohesion Oral - Thin Cup -- Oral - Thin Straw Weak lingual manipulation;Reduced posterior propulsion;Lingual/palatal residue;Delayed oral transit;Decreased bolus cohesion Oral - Puree Weak lingual manipulation;Reduced posterior propulsion;Lingual/palatal residue;Delayed oral transit;Decreased bolus cohesion Oral - Mech Soft -- Oral - Regular -- Oral - Multi-Consistency -- Oral - Pill -- Oral Phase - Comment --  CHL IP PHARYNGEAL PHASE 08/06/2020 Pharyngeal Phase Impaired Pharyngeal- Pudding Teaspoon -- Pharyngeal -- Pharyngeal- Pudding Cup -- Pharyngeal -- Pharyngeal- Honey Teaspoon -- Pharyngeal -- Pharyngeal- Honey Cup -- Pharyngeal -- Pharyngeal- Nectar Teaspoon Reduced pharyngeal peristalsis;Reduced epiglottic inversion;Reduced anterior laryngeal mobility;Reduced laryngeal elevation;Reduced airway/laryngeal closure;Reduced tongue base retraction;Pharyngeal residue - valleculae;Penetration/Aspiration during swallow;Penetration/Apiration after swallow Pharyngeal Material enters airway, passes BELOW cords without attempt by patient to eject out (silent aspiration) Pharyngeal- Nectar Cup -- Pharyngeal -- Pharyngeal- Nectar Straw -- Pharyngeal -- Pharyngeal- Thin Teaspoon Reduced pharyngeal peristalsis;Reduced epiglottic inversion;Reduced anterior laryngeal mobility;Reduced laryngeal elevation;Reduced airway/laryngeal closure;Reduced tongue base retraction;Pharyngeal residue - valleculae;Penetration/Aspiration during swallow;Penetration/Apiration after swallow Pharyngeal Material enters airway, passes BELOW cords without attempt by patient to eject out (silent aspiration) Pharyngeal- Thin Cup -- Pharyngeal -- Pharyngeal- Thin Straw Reduced pharyngeal peristalsis;Reduced epiglottic inversion;Reduced anterior laryngeal mobility;Reduced laryngeal elevation;Reduced airway/laryngeal closure;Reduced tongue base retraction;Pharyngeal residue - valleculae;Penetration/Aspiration during  swallow;Penetration/Apiration after swallow Pharyngeal Material enters airway, passes BELOW cords without attempt by patient to eject out (silent aspiration) Pharyngeal- Puree Reduced pharyngeal peristalsis;Reduced epiglottic inversion;Reduced anterior laryngeal mobility;Reduced laryngeal elevation;Reduced airway/laryngeal closure;Reduced tongue base retraction;Pharyngeal residue - valleculae;Penetration/Apiration after swallow Pharyngeal Material enters airway, passes BELOW cords without attempt by patient to eject out (silent aspiration) Pharyngeal- Mechanical Soft -- Pharyngeal -- Pharyngeal- Regular -- Pharyngeal -- Pharyngeal- Multi-consistency -- Pharyngeal -- Pharyngeal- Pill -- Pharyngeal -- Pharyngeal Comment --  CHL IP CERVICAL ESOPHAGEAL PHASE 08/06/2020 Cervical Esophageal Phase WFL Pudding Teaspoon -- Pudding Cup -- Honey Teaspoon -- Honey Cup -- Nectar Teaspoon -- Nectar Cup -- Nectar Straw -- Thin Teaspoon -- Thin Cup -- Thin Straw -- Puree -- Mechanical Soft -- Regular -- Multi-consistency -- Pill -- Cervical Esophageal Comment -- Mahala Menghini., M.A. CCC-SLP Acute Rehabilitation Services Pager [phone] Office [phone] 08/06/2020, 4:05 PM                Medications:     Scheduled Medications: . aspirin  81 mg Per Tube Daily  . carvedilol  3.125 mg Per Tube BID WC  . chlorhexidine  15 mL Mouth Rinse BID  . Chlorhexidine Gluconate Cloth  6 each Topical Daily  . clonazepam  0.5 mg Per Tube TID  . digoxin  0.125 mg Per Tube Daily  . enoxaparin (LOVENOX) injection  30 mg Subcutaneous Daily  . famotidine  20 mg Per Tube BID  . feeding supplement (PROSource TF)  45 mL Per Tube BID  . free water  100 mL Per Tube Q4H  . insulin aspart  0-15 Units Subcutaneous Q4H  . insulin aspart  3 Units Subcutaneous Q4H  . LORazepam  1-2 mg Intravenous Once  . mouth rinse  15 mL Mouth Rinse q12n4p  . midodrine  2.5 mg Per Tube TID WC  . nicotine  14 mg Transdermal Daily  .  oxyCODONE  5 mg Per Tube  Q8H  . rosuvastatin  40 mg Per Tube Daily  . sodium chloride flush  10-40 mL Intracatheter Q12H  . ticagrelor  90 mg Per Tube BID    Infusions: . sodium chloride Stopped (08/06/20 0521)  . sodium chloride Stopped (08/05/20 1117)  . feeding supplement (OSMOLITE 1.5 CAL) 1,000 mL (08/06/20 2131)    PRN Medications: Place/Maintain arterial line **AND** sodium chloride, acetaminophen (TYLENOL) oral liquid 160 mg/5 mL, guaiFENesin-dextromethorphan, haloperidol lactate, ipratropium-albuterol, loperamide HCl, ondansetron (ZOFRAN) IV, sodium chloride flush    Assessment/Plan   1. CAD: Anterior STEMI with occluded proximal LAD.  Treated with DES but complicated by embolization to distal LAD (procedure completed with occluded distal LAD).   - No chest pain.  - Continue ticagrelor and ASA 81.  - Crestor 40 daily. 2. Cardiogenic shock: Ischemic cardiomyopathy. Echo with EF 30-35%, no LV thrombus, LAD territory WMAs, RV normal, IVC normal.  - Co-ox 59% off milrinone  - Continue midodrine 2.5 mg tid, wean as tolerated  - CVP 2-3. Hold lasix again today  - Continue digoxin 0.125.  3. Acute hypoxemic respiratory failure: Now extubated.  Suspect HCAP. WBCs continues to come down. Finished Zosyn 5/29, started vancomycin/ceftazidime 5/31, continue 7 days total.  Cultures NGTD.  4. Cardiac arrest: VF arrest associated with STEMI. Patient shocked x 5, 15 min CPR before ROSC.  No further VT.  Now rewarmed s/p hypothermia protocol.  He has been revascularized.  - Will need Lifevest at discharge .   5. Anemia: Transfused 1 U on 5/27, Hgb up to 10.5 6. Paroxysmal Atrial fibrillation: We have not seen any actual atrial fibrillation, has had ST with PACs.  Currently NSR - Lovenox prophylaxis - Now off amio. C/w Coreg   7. Rt Pleural Effusion: s/p thoracentesis 6/1 w/ 450 cc fluid removal. F/u CXR 6/2 w/o effusion.  8. Delirium: improving. C/w bedside sitting  9. Hypernatremia: Na 140 today Per primary  team.   10. Dysphagia - s/p MBS 6/6. Severe oropharyngeal dysphagia  Remains high aspiration risk.  - To continue NPO per SLP.  - Has Cortrak for TFs     Robbie Lis, PA-C 08/07/2020 7:48 AM  Patient seen with PA, agree with the above note.   MBS yesterday, severe dysphagia and remains aspiration risk, has Cortrak.   CVP low, co-ox 59% off milrinone.  SBP > 100.    General: NAD Neck: No JVD, no thyromegaly or thyroid nodule.  Lungs: Clear to auscultation bilaterally with normal respiratory effort. CV: Nondisplaced PMI.  Heart regular S1/S2, no S3/S4, no murmur.  No peripheral edema.   Abdomen: Soft, nontender, no hepatosplenomegaly, no distention.  Skin: Intact without lesions or rashes.  Neurologic: Confused Extremities: No clubbing or cyanosis.  HEENT: Normal.   Language barrier (and interpreter has had a hard time hearing him), but think still with some confusion.   CVP low, can hold off on Lasix today.   Co-ox stable off milrinone.  I am going to stop midodrine today.  Continue Coreg and digoxin.  He can start Farxiga 10 mg daily.   Will need SNF.  Cortrak remains in due to dysphagia/aspiration risk, ?PEG needed eventually.   Marca Ancona 08/07/2020 9:11 AM

## 2020-08-07 NOTE — Progress Notes (Addendum)
PROGRESS NOTE    Vincent Horton  POL:[id number] DOB: [birth year] DOA: 07/21/2020 PCP: No primary care provider on file.    Brief Narrative:  Vincent Horton is a 65 year old male with limited past medical history who was initially found down by family at home.  EMS was activated and patient was found to be in ventricular fibrillation.  Patient was shocked 5 times and given epinephrine and amiodarone with ROSC obtained approximately within 15 minutes.  Patient was intubated, started on vasopressors for cardiogenic shock and transferred to the intensive care unit.  Patient also was emergently taken to the catheterization lab due to ST segment changes in anterior leads and underwent revascularization of his LAD.  Milrinone drip was slowly weaned off.  Patient was extubated on 07/31/2020 and transferred to the hospitalist service on 08/30/2020.   Assessment & Plan:   Principal Problem:   Acute ST elevation myocardial infarction (STEMI) due to occlusion of left anterior descending (LAD) coronary artery (HCC) Active Problems:   Cardiac arrest [facility])   STEMI involving left anterior descending coronary artery (HCC)   Protein calorie malnutrition (HCC)   Acute hypoxemic respiratory failure (HCC)   Ischemic cardiomyopathy   Aspiration pneumonia (HCC)   Pulmonary edema cardiac cause (HCC)   Acute kidney injury (HCC)   Shock liver   Cardiogenic shock (HCC)   Transaminitis   Protein-calorie malnutrition, severe   Pleural effusion   Cardiac arrest Ventricular fibrillation STEMI CAD Patient presenting to ED after being found down at home by family members.  On EMS arrival, patient was noted to be in ventricular fibrillation, CPR was initiated and received 5 shocks with associated epinephrine and amiodarone with achievement of ROSC in about 15 minutes.  Patient underwent emergent heart catheterization and revascularization of LAD with DES.  Patient was intubated and under care in the intensive care unit with  post resuscitative targeted temperature management. --Cardiology following, appreciate assistance --Carvedilol 3.125mg  BID --Digoxin 0.125mg  daily --Continue aspirin, Brilinta, Crestor  Ischemic cardiomyopathy Cardiogenic shock TTE 07/21/2020 with LVEF 30-35%, grade 1 diastolic dysfunction.  Initially required milrinone drip in the intensive care unit. --Aspirin --Brilinta --Crestor 40mg  daily --Midodrine 2.5 mg TID  Acute respiratory failure with hypoxia, POA: Resolved Healthcare associated pneumonia Chest x-ray notable for diffuse bilateral pulmonary infiltrates.  Patient completed extended antibiotic course with combination vancomycin, Zosyn, and ceftazidime.  Supplemental oxygen has been titrated off and now oxygenating well on room air.  Right pleural effusion Underwent thoracentesis on 08/01/2020 with 4 to 50 mL of fluid removal.  Follow-up chest x-ray 6/2 with resolution of effusion.  Anemia of chronic disease Patient transfused 1 unit PRBC on 07/27/2020. --Hemoglobin stable  Hypernatremia: Resolved Etiology likely secondary to free water deficit, poor oral intake and dysphagia.  Core track tube has been placed.   --Consider tube feeds with free water  Acute metabolic encephalopathy Etiology likely secondary to cardiac arrest with prolonged downtime, suspicious of anoxic brain injury.  CT head on admission with hypodensity deep white matter frontal lobes bilaterally likely secondary to chronic microvascular ischemia, no cerebral edema.  EEG negative for epileptiform activity.  Despite improvement and normalization of electrolytes/sodium level, patient remains confused, lethargic requiring mittens and abdominal restraints to prevent removal of medical equipment. --MRI brain: Pending --Continue supportive care  Severe oropharyngeal dysphagia Underwent modified barium swallow on 08/06/2020 --Speech therapy following --Continue nothing by mouth --Core track in place --Continue  tube feeds --Will need PEG tube if family in agreement  Ethics: Patient continues to make very  poor progress following his cardiac/V. fib arrest with associated new onset ischemic cardiomyopathy requiring DES and a course of vasopressors.  Patient continues with severe dysphagia requiring core track tube for tube feeds.  Palliative care has been consulted and following during hospital course for assistance with goals of care and medical decision making.  Family meeting on 08/07/2020 with patient's son and daughter to discuss options moving forward.  Discussed options to include a more palliative/comfort approach or continued aggressive treatment which will require PEG tube.  They will discuss this with the remainder of their family.  Patient's son also wants the results of the MRI brain before making any further decisions.   DVT prophylaxis: enoxaparin (LOVENOX) injection 30 mg Start: 07/29/20 1000 SCDs Start: 07/25/20 0300    Code Status: Full Code Family Communication: Updated daughter and son with palliative care team  Disposition Plan:  Level of care: Progressive Status is: Inpatient  Remains inpatient appropriate because:Altered mental status, Ongoing diagnostic testing needed not appropriate for outpatient work up, Unsafe d/c plan, IV treatments appropriate due to intensity of illness or inability to take PO and Inpatient level of care appropriate due to severity of illness   Dispo: The patient is from: Home              Anticipated d/c is to: SNF              Patient currently is not medically stable to d/c.   Difficult to place patient No   Consultants:   PCCM  Cardiology  Procedures:   Cardiac catheterization 5/21, with placement DES to LAD  Intubation 5/21  Targeted temperature management 5/21  Right IJ central line placement 5/21  Arterial line  Extubated 5/31  Antimicrobials:   Vancomycin 5/31 - 6/6  Cefepime 5/31 - 6/6  Zosyn 5/21 -  5/28  Anti-infectives (From admission, onward)   Start     Dose/Rate Route Frequency Ordered Stop   08/03/20 2330  vancomycin (VANCOREADY) IVPB 750 mg/150 mL        750 mg 150 mL/hr over 60 Minutes Intravenous Every 12 hours 08/03/20 1206 08/06/20 2235   08/03/20 1000  cefTAZidime (FORTAZ) 2 g in sodium chloride 0.9 % 100 mL IVPB        2 g 200 mL/hr over 30 Minutes Intravenous Every 8 hours 08/03/20 0937 08/06/20 1453   07/31/20 1000  cefTAZidime (FORTAZ) 2 g in sodium chloride 0.9 % 100 mL IVPB  Status:  Discontinued        2 g 200 mL/hr over 30 Minutes Intravenous Every 12 hours 07/31/20 0808 08/03/20 0937   07/31/20 0930  vancomycin (VANCOREADY) IVPB 1000 mg/200 mL  Status:  Discontinued        1,000 mg 200 mL/hr over 60 Minutes Intravenous Every 24 hours 07/31/20 0808 08/03/20 1206   07/21/20 0630  piperacillin-tazobactam (ZOSYN) IVPB 3.375 g        3.375 g 12.5 mL/hr over 240 Minutes Intravenous Every 8 hours 07/21/20 0531 07/28/20 0100   07/21/20 0615  piperacillin-tazobactam (ZOSYN) IVPB 2.25 g  Status:  Discontinued        2.25 g 100 mL/hr over 30 Minutes Intravenous Every 8 hours 07/21/20 0519 07/21/20 0531        Subjective Patient seen examined bedside, resting comfortably.  Sitter present.  Has abdominal restraint and mittens in place due to pulling at core track tube and IV lines.  No family present at bedside this morning.  Unable to obtain  any further ROS this morning due to language barrier despite video interpreter and likely underlying confusion.  No acute concerns overnight per nursing staff.  Objective: Vitals:   08/07/20 0356 08/07/20 0743 08/07/20 0827 08/07/20 1135  BP: (!) 97/58 108/81 106/77 91/60  Pulse: 91 89 86 90  Resp: 16 18  18   Temp: 97.7 F (36.5 C) (!) 97.5 F (36.4 C)  97.9 F (36.6 C)  TempSrc: Oral Oral  Oral  SpO2: 100% 100%  100%  Weight: 48.7 kg     Height:        Intake/Output Summary (Last 24 hours) at 08/07/2020 1402 Last data  filed at 08/06/2020 1600 Gross per 24 hour  Intake 235 ml  Output --  Net 235 ml   Filed Weights   08/05/20 0341 08/06/20 0350 08/07/20 0356  Weight: 48.4 kg 47 kg 48.7 kg    Examination:  General exam: Appears calm and comfortable  Respiratory system: Clear to auscultation. Respiratory effort normal. Cardiovascular system: S1 & S2 heard, RRR. No JVD, murmurs, rubs, gallops or clicks. No pedal edema. Gastrointestinal system: Abdomen is nondistended, soft and nontender. No organomegaly or masses felt. Normal bowel sounds heard. Central nervous system: Alert and oriented. No focal neurological deficits. Extremities: Symmetric 5 x 5 power. Skin: No rashes, lesions or ulcers Psychiatry: Judgement and insight appear normal. Mood & affect appropriate.     Data Reviewed: I have personally reviewed following labs and imaging studies  CBC: Recent Labs  Lab 08/02/20 0354 08/03/20 0400 08/04/20 0420 08/05/20 0304 08/06/20 2044  WBC 19.8* 15.3* 15.1* 14.1* 11.0*  NEUTROABS  --   --   --   --  8.2*  HGB 9.0* 9.4* 9.9* 10.0* 10.5*  HCT 29.5* 29.8* 31.2* 31.5* 33.4*  MCV 98.3 94.9 94.5 94.9 94.6  PLT 428* 448* 435* 406* 348   Basic Metabolic Panel: Recent Labs  Lab 08/01/20 0343 08/02/20 0354 08/03/20 0400 08/04/20 0420 08/05/20 0304 08/06/20 0422 08/07/20 0403  NA 146*   < > 150* 148* 145 143 140  K 3.9   < > 3.9 3.6 3.8 3.8 4.2  CL 109   < > 121* 120* 118* 117* 110  CO2 27   < > 22 23 23 23 23   GLUCOSE 254*   < > 177* 200* 218* 222* 125*  BUN 30*   < > 29* 28* 26* 21 24*  CREATININE 1.00   < > 0.95 0.81 0.76 0.68 0.60*  CALCIUM 8.4*   < > 8.1* 8.2* 8.1* 8.0* 8.3*  MG 2.4  --   --   --   --   --   --    < > = values in this interval not displayed.   GFR: Estimated Creatinine Clearance: 62.6 mL/min (A) (by C-G formula based on SCr of 0.6 mg/dL (L)). Liver Function Tests: Recent Labs  Lab 08/01/20 0343 08/01/20 1440  AST 61*  --   ALT 81*  --   ALKPHOS 104  --    BILITOT 0.8  --   PROT 6.1* 6.0*  ALBUMIN 2.0*  --    No results for input(s): LIPASE, AMYLASE in the last 168 hours. No results for input(s): AMMONIA in the last 168 hours. Coagulation Profile: Recent Labs  Lab 08/02/20 0354 08/03/20 0400 08/04/20 0420 08/05/20 0304 08/06/20 0422  INR 1.2 1.2 1.2 1.1 1.1   Cardiac Enzymes: No results for input(s): CKTOTAL, CKMB, CKMBINDEX, TROPONINI in the last 168 hours. BNP (last 3 results)  No results for input(s): PROBNP in the last 8760 hours. HbA1C: No results for input(s): HGBA1C in the last 72 hours. CBG: Recent Labs  Lab 08/06/20 2100 08/07/20 0033 08/07/20 0400 08/07/20 0752 08/07/20 1133  GLUCAP 117* 164* 126* 162* 127*   Lipid Profile: No results for input(s): CHOL, HDL, LDLCALC, TRIG, CHOLHDL, LDLDIRECT in the last 72 hours. Thyroid Function Tests: No results for input(s): TSH, T4TOTAL, FREET4, T3FREE, THYROIDAB in the last 72 hours. Anemia Panel: No results for input(s): VITAMINB12, FOLATE, FERRITIN, TIBC, IRON, RETICCTPCT in the last 72 hours. Sepsis Labs: No results for input(s): PROCALCITON, LATICACIDVEN in the last 168 hours.  Recent Results (from the past 240 hour(s))  Culture, Respiratory w Gram Stain     Status: None   Collection Time: 07/31/20  8:00 AM   Specimen: Tracheal Aspirate; Respiratory  Result Value Ref Range Status   Specimen Description TRACHEAL ASPIRATE  Final   Special Requests NONE  Final   Gram Stain   Final    FEW WBC PRESENT, PREDOMINANTLY MONONUCLEAR NO ORGANISMS SEEN    Culture   Final    NO GROWTH 2 DAYS Performed at [facility], [address], [place], [place] [address]    Report Status 08/04/2020 FINAL  Final  Body fluid culture w Gram Stain     Status: None   Collection Time: 08/01/20  2:40 PM   Specimen: Fluid  Result Value Ref Range Status   Specimen Description FLUID PLEURAL  Final   Special Requests NONE  Final   Gram Stain   Final    FEW WBC PRESENT,BOTH PMN  AND MONONUCLEAR NO ORGANISMS SEEN    Culture   Final    NO GROWTH 3 DAYS Performed at [facility], [address], [place], [place] [address]    Report Status 08/05/2020 FINAL  Final         Radiology Studies: DG Swallowing Func-Speech Pathology  Result Date: 08/06/2020 Objective Swallowing Evaluation: Type of Study: MBS-Modified Barium Swallow Study  Patient Details Name: Vincent Horton MRN: [id number] Date of Birth: [birth year] Today's Date: 08/06/2020 Time: SLP Start Time (ACUTE ONLY): 1303 -SLP Stop Time (ACUTE ONLY): 1325 SLP Time Calculation (min) (ACUTE ONLY): 22 min Past Medical History: Past Medical History: Diagnosis Date . Cardiac arrest (HCC)  . Coronary artery disease  . Dysrhythmia  . Myocardial infarction [facility])  Past Surgical History: Past Surgical History: Procedure Laterality Date . CARDIAC CATHETERIZATION   . CORONARY/GRAFT ACUTE MI REVASCULARIZATION N/A 07/21/2020  Procedure: Coronary/Graft Acute MI Revascularization;  Surgeon: Swaziland, Peter M, MD;  Location: [facility] INVASIVE CV LAB;  Service: Cardiovascular;  Laterality: N/A; . LEFT HEART CATH AND CORONARY ANGIOGRAPHY N/A 07/21/2020  Procedure: LEFT HEART CATH AND CORONARY ANGIOGRAPHY;  Surgeon: Swaziland, Peter M, MD;  Location: [facility] INVASIVE CV LAB;  Service: Cardiovascular;  Laterality: N/A; . RIGHT HEART CATH N/A 07/21/2020  Procedure: RIGHT HEART CATH;  Surgeon: Swaziland, Peter M, MD;  Location: [facility] INVASIVE CV LAB;  Service: Cardiovascular;  Laterality: N/A; HPI: Pt is a 65 yo male found down by family VF arrest associated with STEMI, shocked 5x, 15 min CPR before ROSC achieved. ETT 5/21-5/31. CXR 5/31 with persistent bibasilar infiltrates/edema. PMH includes cardiac arrest, CAD, dysrhythmia, myocardial infarction. Per pt's son, pt's oral intake at home was limited to very soft and/or liquidised foods PTA due to condition of dentition.  Subjective: pt alert and cooperative, needing repetition of instructions Assessment / Plan / Recommendation  CHL IP CLINICAL  IMPRESSIONS 08/06/2020 Clinical Impression Pt has a severe oropharyngeal dysphagia that is likely multifactorial in the setting of decreased strength and sensation s/p intubation and overall deconditioning, further impacted by reduced oral hygiene/presence of secretions and cognitive impairment. Video interpreter Linna Caprice [phone]) was used throughout this study. Pt has weak lingual manipulation with slow, repetitive movements used to work boluses back toward his pharynx but incompletely clearing his oral cavity. Pharyngeally he has reduced base of tongue retraction, hyolaryngeal movement, pharyngeal squeeze, and epiglottic inversion, leading to impaired airway closure and reduced pharyngeal clearance. Thin and nectar thick liquids are silently aspirated during the swallow, and all consistencies tested, including purees, are silently aspirated after the swallow due to pharyngeal residue that also appears to mix with secretions. [place]auer was used to clear his oral cavity as much as possible. He cannot produce enough strength to clear residuals despite cues for multiple hard swallows or chin tuck. He also cannot produce a cough hard enough to clear any aspirates or anything that remained in his laryngeal vestibule. Recommend that he continue to remain NPO with strong focus on oral care. Given mentation and severity of deficits, pt's recovery may take a longer amount of time, and he therefore may benefit from longer-term alternative means of nutrition if nearing readiness for discharge otherwise. SLP Visit Diagnosis Dysphagia, oropharyngeal phase (R13.12) Attention and concentration deficit following -- Frontal lobe and executive function deficit following -- Impact on safety and function Severe aspiration risk;Risk for inadequate nutrition/hydration   CHL IP TREATMENT RECOMMENDATION 08/06/2020 Treatment Recommendations Therapy as outlined in treatment plan below   Prognosis 08/06/2020 Prognosis for Safe Diet  Advancement Good Barriers to Reach Goals Cognitive deficits;Severity of deficits Barriers/Prognosis Comment -- CHL IP DIET RECOMMENDATION 08/06/2020 SLP Diet Recommendations NPO;Alternative means - temporary Liquid Administration via -- Medication Administration Via alternative means Compensations -- Postural Changes --   CHL IP OTHER RECOMMENDATIONS 08/06/2020 Recommended Consults -- Oral Care Recommendations Oral care QID Other Recommendations Have oral suction available   CHL IP FOLLOW UP RECOMMENDATIONS 08/06/2020 Follow up Recommendations Inpatient Rehab   CHL IP FREQUENCY AND DURATION 08/06/2020 Speech Therapy Frequency (ACUTE ONLY) min 2x/week Treatment Duration 2 weeks      CHL IP ORAL PHASE 08/06/2020 Oral Phase Impaired Oral - Pudding Teaspoon -- Oral - Pudding Cup -- Oral - Honey Teaspoon -- Oral - Honey Cup -- Oral - Nectar Teaspoon Weak lingual manipulation;Reduced posterior propulsion;Lingual/palatal residue;Delayed oral transit;Decreased bolus cohesion Oral - Nectar Cup -- Oral - Nectar Straw -- Oral - Thin Teaspoon Weak lingual manipulation;Reduced posterior propulsion;Lingual/palatal residue;Delayed oral transit;Decreased bolus cohesion Oral - Thin Cup -- Oral - Thin Straw Weak lingual manipulation;Reduced posterior propulsion;Lingual/palatal residue;Delayed oral transit;Decreased bolus cohesion Oral - Puree Weak lingual manipulation;Reduced posterior propulsion;Lingual/palatal residue;Delayed oral transit;Decreased bolus cohesion Oral - Mech Soft -- Oral - Regular -- Oral - Multi-Consistency -- Oral - Pill -- Oral Phase - Comment --  CHL IP PHARYNGEAL PHASE 08/06/2020 Pharyngeal Phase Impaired Pharyngeal- Pudding Teaspoon -- Pharyngeal -- Pharyngeal- Pudding Cup -- Pharyngeal -- Pharyngeal- Honey Teaspoon -- Pharyngeal -- Pharyngeal- Honey Cup -- Pharyngeal -- Pharyngeal- Nectar Teaspoon Reduced pharyngeal peristalsis;Reduced epiglottic inversion;Reduced anterior laryngeal mobility;Reduced laryngeal  elevation;Reduced airway/laryngeal closure;Reduced tongue base retraction;Pharyngeal residue - valleculae;Penetration/Aspiration during swallow;Penetration/Apiration after swallow Pharyngeal Material enters airway, passes BELOW cords without attempt by patient to eject out (silent aspiration) Pharyngeal- Nectar Cup -- Pharyngeal -- Pharyngeal- Nectar Straw -- Pharyngeal -- Pharyngeal- Thin Teaspoon Reduced pharyngeal peristalsis;Reduced epiglottic inversion;Reduced anterior laryngeal mobility;Reduced laryngeal elevation;Reduced airway/laryngeal closure;Reduced tongue  base retraction;Pharyngeal residue - valleculae;Penetration/Aspiration during swallow;Penetration/Apiration after swallow Pharyngeal Material enters airway, passes BELOW cords without attempt by patient to eject out (silent aspiration) Pharyngeal- Thin Cup -- Pharyngeal -- Pharyngeal- Thin Straw Reduced pharyngeal peristalsis;Reduced epiglottic inversion;Reduced anterior laryngeal mobility;Reduced laryngeal elevation;Reduced airway/laryngeal closure;Reduced tongue base retraction;Pharyngeal residue - valleculae;Penetration/Aspiration during swallow;Penetration/Apiration after swallow Pharyngeal Material enters airway, passes BELOW cords without attempt by patient to eject out (silent aspiration) Pharyngeal- Puree Reduced pharyngeal peristalsis;Reduced epiglottic inversion;Reduced anterior laryngeal mobility;Reduced laryngeal elevation;Reduced airway/laryngeal closure;Reduced tongue base retraction;Pharyngeal residue - valleculae;Penetration/Apiration after swallow Pharyngeal Material enters airway, passes BELOW cords without attempt by patient to eject out (silent aspiration) Pharyngeal- Mechanical Soft -- Pharyngeal -- Pharyngeal- Regular -- Pharyngeal -- Pharyngeal- Multi-consistency -- Pharyngeal -- Pharyngeal- Pill -- Pharyngeal -- Pharyngeal Comment --  CHL IP CERVICAL ESOPHAGEAL PHASE 08/06/2020 Cervical Esophageal Phase WFL Pudding Teaspoon --  Pudding Cup -- Honey Teaspoon -- Honey Cup -- Nectar Teaspoon -- Nectar Cup -- Nectar Straw -- Thin Teaspoon -- Thin Cup -- Thin Straw -- Puree -- Mechanical Soft -- Regular -- Multi-consistency -- Pill -- Cervical Esophageal Comment -- Mahala MenghiniLaura N., M.A. CCC-SLP Acute Rehabilitation Services Pager [phone] Office [phone] 08/06/2020, 4:05 PM                   Scheduled Meds: . aspirin  81 mg Per Tube Daily  . carvedilol  3.125 mg Per Tube BID WC  . chlorhexidine  15 mL Mouth Rinse BID  . Chlorhexidine Gluconate Cloth  6 each Topical Daily  . clonazepam  0.5 mg Per Tube TID  . digoxin  0.125 mg Per Tube Daily  . enoxaparin (LOVENOX) injection  30 mg Subcutaneous Daily  . famotidine  20 mg Per Tube BID  . feeding supplement (PROSource TF)  45 mL Per Tube BID  . free water  100 mL Per Tube Q4H  . insulin aspart  0-15 Units Subcutaneous Q4H  . insulin aspart  3 Units Subcutaneous Q4H  . LORazepam  1-2 mg Intravenous Once  . mouth rinse  15 mL Mouth Rinse q12n4p  . nicotine  14 mg Transdermal Daily  . oxyCODONE  5 mg Per Tube Q8H  . rosuvastatin  40 mg Per Tube Daily  . sodium chloride flush  10-40 mL Intracatheter Q12H  . ticagrelor  90 mg Per Tube BID   Continuous Infusions: . sodium chloride Stopped (08/06/20 0521)  . sodium chloride Stopped (08/05/20 1117)  . feeding supplement (OSMOLITE 1.5 CAL) 1,000 mL (08/06/20 2131)     LOS: 17 days    Time spent: 39 minutes spent on chart review, discussion with nursing staff, consultants, updating family and interview/physical exam; more than 50% of that time was spent in counseling and/or coordination of care.    Alvira PhilipsEric J [place], DO [facility] Available via Epic secure chat 7am-7pm After these hours, please refer to coverage provider listed on [email or web address] 08/07/2020, 2:02 PM

## 2020-08-07 NOTE — Progress Notes (Signed)
Physical Therapy Treatment Patient Details Name: Vincent Horton MRN: [id number] DOB: 08/24/1955 Today's Date: 08/07/2020    History of Present Illness Pt is a 65 y.o. male seen on 07/21/2020 for evaluation of STEMI. Upon EMS arrival, pt apneic and was in VFib and shocked 5 times with 15-min CPR before ROSC achieved. Pt intubated on site (5/21) and transferred to [facility]. CXR with diffuse bilateral pulmonary infiltrates/edema, atelectasis, small R pleural effusion. ETT 5/21-5/31. Course complicated by acute metabolic encephalopathy; suspect secondary to cardiac arrest with prolonged downtime, suspicious of anoxic brain injury. Head CT with hypodensity deep white matter bilateral frontal lobes, likely secondary to chronic microvascular ischemia; no cerebral edema. EEG negative for epileptiform activity. MRI pending 6/7. PMH includes cardiac arrest, CAD, dysrhythmia, MI.   PT Comments    Pt demonstrates significant improve with mobility this session. Pt more alert and participatory, following majority of simple commands in English, but still with minimal verbalizations. Today's session focused on transfer and gait training, pt requiring up to mod-maxA to maintain stability with ambulation. Pt pleasant and motivated to participate. Pt remains limited by muscle weakness, decreased activity tolerance, poor balance strategies/postural reactions and cognitive impairment, including decreased attention and difficulty problem solving. Feel pt is an excellent candidate for intensive CIR-level therapies to maximize functional mobility and independence prior to return home; pt present to discuss recommendation and report family available for 24/7 assist ([facility]IR [facility] notified of updated recommendation). Will continue to follow acutely to address established goals.    Follow Up Recommendations  CIR;Supervision for mobility/OOB     Equipment Recommendations   (TBD)    Recommendations for Other Services       Precautions /  Restrictions Precautions Precautions: Fall Restrictions Weight Bearing Restrictions: No    Mobility  Bed Mobility Overal bed mobility: Needs Assistance Bed Mobility: Supine to Sit     Supine to sit: Min assist;HOB elevated     General bed mobility comments: MinA for trunk elevation and scooting hips; pt easily distracted by lines requiring verbal/tactile cues for redirection    Transfers Overall transfer level: Needs assistance Equipment used: 1 person hand held assist Transfers: Sit to/from Stand Sit to Stand: Mod assist;Min assist         General transfer comment: Multiple sit<>stands from EOB and recliner with DME, reliant on min-modA for trunk elevation and stability; bilateral knee instability requiring UE support and external assist to maintain balance  Ambulation/Gait Ambulation/Gait assistance: Mod assist;Max assist;+2 safety/equipment Gait Distance (Feet): 20 Feet (+12) Assistive device: 1 person hand held assist Gait Pattern/deviations: Step-through pattern;Decreased stride length;Shuffle;Trunk flexed;Narrow base of support Gait velocity: Decreased   General Gait Details: Slow, unsteady gait with HHA and bouts of mod-maxA for stability; pt with bilateral knee buckling requiring frequent external assist to maintain standing; 1x seated rest break secondary to fatigue   Stairs             Wheelchair Mobility    Modified Rankin (Stroke Patients Only)       Balance Overall balance assessment: Needs assistance   Sitting balance-Leahy Scale: Fair Sitting balance - Comments: Initial minA progressing to supervision; by end of session, able to maintain static sitting and perform LE therex with "soccer ball", difficulty reaching outside of BOS to ground   Standing balance support: No upper extremity supported;During functional activity;Single extremity supported Standing balance-Leahy Scale: Poor Standing balance comment: Reliant on external assist to  maintain static standing balance  Cognition Arousal/Alertness: Awake/alert Behavior During Therapy: Flat affect Overall Cognitive Status: Impaired/Different from baseline Area of Impairment: Attention;Following commands;Safety/judgement;Awareness;Problem solving                   Current Attention Level: Sustained;Selective   Following Commands: Follows one step commands consistently;Follows one step commands with increased time Safety/Judgement: Decreased awareness of safety;Decreased awareness of deficits Awareness: Emergent Problem Solving: Difficulty sequencing;Requires verbal cues;Requires tactile cues General Comments: Minimal audible verbal communication, but responding and following commands in [place]. Pt easily distracted by multiple lines requiring frequent redirection. Pt performing some automatic tasks with increasing insight into safety (i.e. looking to make sure chair brought behind him before sitting). Pleasant and motivated to participate      Exercises Other Exercises Other Exercises: Seated LE therex (with balled up paper towel as "soccer ball") - LAQ, seated marching, reaching to floor, 'header' attempts    General Comments General comments (skin integrity, edema, etc.): Pt's future son-in-law present and supportive, daughter present at end of session. Educ pt and family on recommendation for post-acute rehab at [facility]; if CIR not an option, will recommend SNF. Family very supportive and involved, will be able to provide 24/7 assist      Pertinent Vitals/Pain Pain Assessment: No/denies pain Pain Intervention(s): Monitored during session    Home Living                      Prior Function            PT Goals (current goals can now be found in the care plan section) Acute Rehab PT Goals Patient Stated Goal: To walk, play soccer; be able to return home PT Goal Formulation: With patient/family Progress towards  PT goals: Progressing toward goals    Frequency    Min 3X/week      PT Plan Current plan remains appropriate;Discharge plan needs to be updated;Frequency needs to be updated    Co-evaluation              AM-PAC PT "6 Clicks" Mobility   Outcome Measure  Help needed turning from your back to your side while in a flat bed without using bedrails?: A Little Help needed moving from lying on your back to sitting on the side of a flat bed without using bedrails?: A Little Help needed moving to and from a bed to a chair (including a wheelchair)?: A Lot Help needed standing up from a chair using your arms (e.g., wheelchair or bedside chair)?: A Lot Help needed to walk in hospital room?: A Lot Help needed climbing 3-5 steps with a railing? : Total 6 Click Score: 13    End of Session Equipment Utilized During Treatment: Gait belt Activity Tolerance: Patient tolerated treatment well Patient left: in chair;with call bell/phone within reach;with chair alarm set;with nursing/sitter in room;with family/visitor present Nurse Communication: Mobility status PT Visit Diagnosis: Unsteadiness on feet (R26.81);Other abnormalities of gait and mobility (R26.89);Muscle weakness (generalized) (M62.81)     Time: 0762-2633 PT Time Calculation (min) (ACUTE ONLY): 33 min  Charges:  $Gait Training: 8-22 mins $Therapeutic Activity: 8-22 mins                     Ina Homes, PT, DPT Acute Rehabilitation Services  Pager [phone] Office [phone]  Malachy Chamber 08/07/2020, 5:20 PM

## 2020-08-07 NOTE — Progress Notes (Signed)
Nutrition Follow Up  DOCUMENTATION CODES:   Severe malnutrition in context of chronic illness  INTERVENTION:   Continue TF via Cortrak tube: -Osmolite 1.5 @ 45 ml/hr via Cortrak (1080 ml) -ProSource TF 45 ml BID  Provides: 1700 kcals, 90 grams protein, 823 ml free water.   Consider G-tube placement for longer term enteral nutrition support.  NUTRITION DIAGNOSIS:   Severe Malnutrition related to chronic illness as evidenced by severe fat depletion,severe muscle depletion.  Ongoing  GOAL:   Patient will meet greater than or equal to 90% of their needs   Met with TF  MONITOR:   Vent status,TF tolerance,Weight trends,Skin,Labs  REASON FOR ASSESSMENT:   Ventilator    ASSESSMENT:   Patient without PMH on file. Presents this admission after v.fib arrest associated with STEMI.   Patient remains NPO per SLP recommendations S/P MBS 6/6.  Cortrak in place, tip is gastric. Tolerating TF without difficulty: Osmolite 1.5 at 45 ml/h with Prosource TF 45 ml BID to provide 1700 kcal, 90 gm protein, 823 ml free water daily.  SLP suspects swallowing recovery may take a long time.  Patient would benefit from g-tube placement for ongoing nutrition support until he can safely take POs.  Admission weight: 55 kg  Current weight: 48.7 kg  UOP: 2000 ml x 24 hrs  I/O -4.4 L since admission  Labs reviewed.  CBG: [phone]  Medications reviewed and include Novolog.   Diet Order:   Diet Order            Diet NPO time specified  Diet effective now                 EDUCATION NEEDS:   Not appropriate for education at this time  Skin:  Skin Assessment: Reviewed RN Assessment  Last BM:  6/6 type 6  Height:   Ht Readings from Last 1 Encounters:  07/30/20 5' 4" (1.626 m)    Weight:   Wt Readings from Last 1 Encounters:  08/07/20 48.7 kg    BMI:  Body mass index is 18.43 kg/m.  Estimated Nutritional Needs:   Kcal:  1500-1700 kcal  Protein:  80-105  grams  Fluid:  >/= 1.5 L/day    H, RD, LDN, CNSC Please refer to Amion for contact information.                                                        

## 2020-08-07 NOTE — Progress Notes (Signed)
RT called to room to NTS pt.  RT NTS pt and got a ton of white think secretions.  Pt looks more comfortable and stated he feels better now. Will NTS RPRN as needed.

## 2020-08-08 ENCOUNTER — Inpatient Hospital Stay (HOSPITAL_COMMUNITY): Payer: Medicare Other

## 2020-08-08 LAB — CBC
HCT: 38.3 % — ABNORMAL LOW (ref 39.0–52.0)
Hemoglobin: 11.7 g/dL — ABNORMAL LOW (ref 13.0–17.0)
MCH: 29.8 pg (ref 26.0–34.0)
MCHC: 30.5 g/dL (ref 30.0–36.0)
MCV: 97.5 fL (ref 80.0–100.0)
Platelets: 359 10*3/uL (ref 150–400)
RBC: 3.93 MIL/uL — ABNORMAL LOW (ref 4.22–5.81)
RDW: 15.9 % — ABNORMAL HIGH (ref 11.5–15.5)
WBC: 12.2 10*3/uL — ABNORMAL HIGH (ref 4.0–10.5)
nRBC: 0 % (ref 0.0–0.2)

## 2020-08-08 LAB — BASIC METABOLIC PANEL
Anion gap: 11 (ref 5–15)
BUN: 23 mg/dL (ref 8–23)
CO2: 20 mmol/L — ABNORMAL LOW (ref 22–32)
Calcium: 8.2 mg/dL — ABNORMAL LOW (ref 8.9–10.3)
Chloride: 108 mmol/L (ref 98–111)
Creatinine, Ser: 0.75 mg/dL (ref 0.61–1.24)
GFR, Estimated: >60 mL/min (ref >60)
Glucose, Bld: 76 mg/dL (ref 70–99)
Potassium: 4.5 mmol/L (ref 3.5–5.1)
Sodium: 139 mmol/L (ref 135–145)

## 2020-08-08 LAB — GLUCOSE, CAPILLARY
Glucose-Capillary: 77 mg/dL (ref 70–99)
Glucose-Capillary: 80 mg/dL (ref 70–99)
Glucose-Capillary: 88 mg/dL (ref 70–99)
Glucose-Capillary: 99 mg/dL (ref 70–99)
Glucose-Capillary: 157 mg/dL — ABNORMAL HIGH (ref 70–99)

## 2020-08-08 LAB — MAGNESIUM: Magnesium: 2.2 mg/dL (ref 1.7–2.4)

## 2020-08-08 MED ORDER — LORAZEPAM 2 MG/ML IJ SOLN
0.5000 mg | Freq: Four times a day (QID) | INTRAMUSCULAR | Status: DC
  Administered 2020-08-08: 0.5 mg via INTRAVENOUS
  Filled 2020-08-08: qty 1

## 2020-08-08 MED ORDER — PANCRELIPASE (LIP-PROT-AMYL) 10440-39150 UNITS PO TABS
20880.0000 [IU] | ORAL_TABLET | Freq: Once | ORAL | Status: AC
  Administered 2020-08-08: 20880 [IU]
  Filled 2020-08-08: qty 2

## 2020-08-08 MED ORDER — TICAGRELOR 90 MG PO TABS
90.0000 mg | ORAL_TABLET | Freq: Two times a day (BID) | ORAL | Status: DC
  Administered 2020-08-09 – 2020-08-10 (×3): 90 mg
  Filled 2020-08-08 (×3): qty 1

## 2020-08-08 MED ORDER — SODIUM BICARBONATE 650 MG PO TABS
650.0000 mg | ORAL_TABLET | Freq: Once | ORAL | Status: AC
  Administered 2020-08-08: 650 mg
  Filled 2020-08-08: qty 1

## 2020-08-08 MED ORDER — MORPHINE SULFATE (PF) 2 MG/ML IV SOLN
2.0000 mg | INTRAVENOUS | Status: DC
Start: 2020-08-08 — End: 2020-08-08
  Administered 2020-08-08: 2 mg via INTRAVENOUS
  Filled 2020-08-08: qty 1

## 2020-08-08 NOTE — Progress Notes (Signed)
  Speech Language Pathology Treatment: Dysphagia  Patient Details Name: Vincent Horton MRN: [id number] DOB: 20-Dec-1955 Today's Date: 08/08/2020 Time: 0623-7628 SLP Time Calculation (min) (ACUTE ONLY): 30 min  Assessment / Plan / Recommendation Clinical Impression  Pt was seen with son present and additional family members joining via videochat on his phone. Video interpreter [facility] [phone]) also utilized. Pt was given ice chips and thin liquids via spoon, more alert today and also more engaged in self-feeding. His voice remains significantly dysphonic, primarily aphonic with brief moments of low vocalizations. Multiple swallows and coughing was noted across trials. SLP provided additional cues for hard swallows and volitional coughs but pt for the most part was not able to give a productive cough. He is impulsive with self-feeding without awareness of swallowing difficulties. SLP provided extensive education to pt and family about current level of function and aspiration on MBS, utilizing playback of video from the study. Recommend that pt continue to remain NPO, although if alertness remains improved, he may be able to attempt more pharyngeal exercises and/or more frequent trials of POs. Recommend CIR level therapy to maximize time for potential recovery, as pt may be able to go to CIR with Cortrak in place.    HPI HPI: Pt is a 65 yo male found down by family VF arrest associated with STEMI, shocked 5x, 15 min CPR before ROSC achieved. ETT 5/21-5/31. CXR 5/31 with persistent bibasilar infiltrates/edema. PMH includes cardiac arrest, CAD, dysrhythmia, myocardial infarction. Per pt's son, pt's oral intake at home was limited to very soft and/or liquidised foods PTA due to condition of dentition.      SLP Plan  Continue with current plan of care       Recommendations  Diet recommendations: NPO Medication Administration: Via alternative means                Oral Care Recommendations: Oral care  QID Follow up Recommendations: Inpatient Rehab SLP Visit Diagnosis: Dysphagia, oropharyngeal phase (R13.12) Plan: Continue with current plan of care       GO                Mahala Menghini., M.A. CCC-SLP Acute Rehabilitation Services Pager [phone] Office [phone]  08/08/2020, 3:58 PM

## 2020-08-08 NOTE — Progress Notes (Addendum)
Patient ID: Vincent Horton, male   DOB: [birth year], 65 y.o.   MRN: [id number] P    Advanced Heart Failure Rounding Note  PCP-Cardiologist: None   Subjective:    MBS completed 6/6. + Severe oropharyngeal dysphagia. Remains high aspiration risk. To continue NPO per SLP. Has Cortrak for TFs   Central line pulled yesterday. Volume status ok. Wt down. CO2 lower, 23>>20.   Brain MRI yesterday showed Punctate acute to subacute left frontal infarct. No evidence of hypoxic/ischemic injury.   Sitter at bedside.   Objective:   Weight Range: 46.6 kg Body mass index is 17.63 kg/m.   Vital Signs:   Temp:  [97.9 F (36.6 C)-98.7 F (37.1 C)] 98.1 F (36.7 C) (06/08 0700) Pulse Rate:  [87-97] 97 (06/08 0700) Resp:  [16-18] 16 (06/08 0700) BP: (91-117)/(60-89) 97/69 (06/08 0700) SpO2:  [96 %-100 %] 98 % (06/08 0700) Weight:  [46.6 kg-48.5 kg] 46.6 kg (06/08 0635) Last BM Date: 08/08/20  Weight change: Filed Weights   08/07/20 0356 08/08/20 0500 08/08/20 0635  Weight: 48.7 kg 48.5 kg 46.6 kg    Intake/Output:   Intake/Output Summary (Last 24 hours) at 08/08/2020 0831 Last data filed at 08/07/2020 1729 Gross per 24 hour  Intake --  Output 1000 ml  Net -1000 ml      Physical Exam   PHYSICAL EXAM: General:  Thin and chronically ill appearing. No respiratory difficulty HEENT: normal + cortrak  Neck: supple. no JVD. Carotids 2+ bilat; no bruits. No lymphadenopathy or thyromegaly appreciated. Cor: PMI nondisplaced. Regular rate & rhythm. No rubs, gallops or murmurs. Lungs: clear Abdomen: soft, nontender, nondistended. No hepatosplenomegaly. No bruits or masses. Good bowel sounds. Extremities: no cyanosis, clubbing, rash, edema Neuro: alert & oriented x 3, cranial nerves grossly intact. moves all 4 extremities w/o difficulty. Affect pleasant.   Telemetry   NSR 80-90s   Labs    CBC Recent Labs    08/06/20 2044 08/08/20 0457  WBC 11.0* 12.2*  NEUTROABS 8.2*  --   HGB 10.5*  11.7*  HCT 33.4* 38.3*  MCV 94.6 97.5  PLT 348 359   Basic Metabolic Panel Recent Labs    61/68/37 0403 08/08/20 0457  NA 140 139  K 4.2 4.5  CL 110 108  CO2 23 20*  GLUCOSE 125* 76  BUN 24* 23  CREATININE 0.60* 0.75  CALCIUM 8.3* 8.2*  MG  --  2.2   Liver Function Tests No results for input(s): AST, ALT, ALKPHOS, BILITOT, PROT, ALBUMIN in the last 72 hours. No results for input(s): LIPASE, AMYLASE in the last 72 hours. Cardiac Enzymes No results for input(s): CKTOTAL, CKMB, CKMBINDEX, TROPONINI in the last 72 hours.  BNP: BNP (last 3 results) No results for input(s): BNP in the last 8760 hours.  ProBNP (last 3 results) No results for input(s): PROBNP in the last 8760 hours.   D-Dimer No results for input(s): DDIMER in the last 72 hours. Hemoglobin A1C No results for input(s): HGBA1C in the last 72 hours. Fasting Lipid Panel No results for input(s): CHOL, HDL, LDLCALC, TRIG, CHOLHDL, LDLDIRECT in the last 72 hours. Thyroid Function Tests No results for input(s): TSH, T4TOTAL, T3FREE, THYROIDAB in the last 72 hours.  Invalid input(s): FREET3  Other results:   Imaging    MR BRAIN WO CONTRAST  Result Date: 08/07/2020 CLINICAL DATA:  Persistent altered mental status post cardiac arrest EXAM: MRI HEAD WITHOUT CONTRAST TECHNIQUE: Multiplanar, multiecho pulse sequences of the brain and surrounding structures were obtained  without intravenous contrast. COMPARISON:  None. FINDINGS: Brain: Punctate focus of reduced diffusion in the left frontal lobe. Prominence of the ventricles and sulci reflects minor generalized parenchymal volume loss. Few small foci of T2 hyperintensity in the supratentorial white matter are nonspecific but may reflect minor chronic microvascular ischemic changes. There is a chronic small vessel infarct of the posterior left putamen. There is no evidence of intracranial hemorrhage. There is no intracranial mass or mass effect. There is no hydrocephalus  or extra-axial fluid collection. Vascular: Major vessel flow voids at the skull base are preserved. Skull and upper cervical spine: Normal marrow signal is preserved. Sinuses/Orbits: Mild mucosal thickening.  Right lens replacement. Other: Sella is unremarkable.  Patchy mastoid fluid opacification. 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 No evidence of hypoxic/ischemic injury. Electronically Signed   By: Guadlupe Spanish M.D.   On: 08/07/2020 18:57     Medications:     Scheduled Medications: . aspirin  81 mg Per Tube Daily  . carvedilol  3.125 mg Per Tube BID WC  . chlorhexidine  15 mL Mouth Rinse BID  . clonazepam  0.5 mg Per Tube TID  . digoxin  0.125 mg Per Tube Daily  . enoxaparin (LOVENOX) injection  30 mg Subcutaneous Daily  . famotidine  20 mg Per Tube BID  . feeding supplement (PROSource TF)  45 mL Per Tube BID  . free water  100 mL Per Tube Q4H  . insulin aspart  0-15 Units Subcutaneous Q4H  . insulin aspart  3 Units Subcutaneous Q4H  . LORazepam  0.5 mg Intravenous Q6H  . LORazepam  1-2 mg Intravenous Once  . mouth rinse  15 mL Mouth Rinse q12n4p  . nicotine  14 mg Transdermal Daily  . oxyCODONE  5 mg Per Tube Q8H  . rosuvastatin  40 mg Per Tube Daily  . sodium chloride flush  10-40 mL Intracatheter Q12H  . ticagrelor  90 mg Per Tube BID    Infusions: . sodium chloride Stopped (08/06/20 0521)  . feeding supplement (OSMOLITE 1.5 CAL) 1,000 mL (08/06/20 2131)    PRN Medications: acetaminophen (TYLENOL) oral liquid 160 mg/5 mL, guaiFENesin-dextromethorphan, haloperidol lactate, ipratropium-albuterol, loperamide HCl, ondansetron (ZOFRAN) IV, sodium chloride flush    Assessment/Plan   1. CAD: Anterior STEMI with occluded proximal LAD.  Treated with DES but complicated by embolization to distal LAD (procedure completed with occluded distal LAD).   - No chest pain.  - Continue ticagrelor and ASA 81.  - Crestor 40 daily. 2. Cardiogenic shock: Ischemic  cardiomyopathy. Echo with EF 30-35%, no LV thrombus, LAD territory WMAs, RV normal, IVC normal.  - Volume status ok. No diuretic needs currently  - Continue digoxin 0.125.  3. Acute hypoxemic respiratory failure: Now extubated.  Suspect HCAP. WBCs continues to come down. Finished Zosyn 5/29, started vancomycin/ceftazidime 5/31, completed 7 days total.  Cultures NGTD.  4. Cardiac arrest: VF arrest associated with STEMI. Patient shocked x 5, 15 min CPR before ROSC.  No further VT.  Now rewarmed s/p hypothermia protocol.  He has been revascularized.  5. Anemia: Transfused 1 U on 5/27, Hgb up to 11 6. Paroxysmal Atrial fibrillation: We have not seen any actual atrial fibrillation, has had ST with PACs.  Currently NSR - Lovenox prophylaxis - Now off amio. C/w Coreg   7. Rt Pleural Effusion: s/p thoracentesis 6/1 w/ 450 cc fluid removal. F/u CXR 6/2 w/o effusion.  8. Delirium: improving. C/w bedside sitting  9. Hypernatremia: Na 139  today Per primary team.   10. Dysphagia - s/p MBS 6/6. Severe oropharyngeal dysphagia  Remains high aspiration risk.  - To continue NPO per SLP.  - Has Cortrak for TFs   11. Debility: PT recommending SNF  12. CVA: MRI yesterday with punctate acute to subacute frontal infarct.   Robbie Lis, PA-C 08/08/2020 8:31 AM  Agree with the above note.   SBP 90s-100s off midodrine, no room for titration of GDMT.   Patient with head MRI showing punctate acute to subacute frontal infarct.  Suspect this does not explain his delirium.  We have not seen definite atrial fibrillation and he is on aspirin + ticagrelor right now.  Would be reasonable to involve neurology, ?LINQ monitor prior to discharge.   Marca Ancona 08/08/2020

## 2020-08-08 NOTE — Progress Notes (Signed)
Notified Berton Bon RD that patients cortrak was occluded and needed to be cleared. Instructed to use unclogging protocol to attempt to clear tube without success. Amanda unclogged tube and xray was obtained to ensure proper placement. Tube feedings resumed and medications updated and given. Patients son at bedside today and explained plan of care as the day progressed. Questions answered and son verbalized understanding. Son explaining to patient during shift what providers were doing.

## 2020-08-08 NOTE — Progress Notes (Signed)
PROGRESS NOTE    Vincent Horton  GHW:[id number] DOB: 11/17/1955 DOA: 07/21/2020 PCP: No primary care provider on file.    Brief Narrative:  Vincent Horton is a 65 year old male with limited past medical history who was initially found down by family at home.  EMS was activated and patient was found to be in ventricular fibrillation.  Patient was shocked 5 times and given epinephrine and amiodarone with ROSC obtained approximately within 15 minutes.  Patient was intubated, started on vasopressors for cardiogenic shock and transferred to the intensive care unit.  Patient also was emergently taken to the catheterization lab due to ST segment changes in anterior leads and underwent revascularization of his LAD.  Milrinone drip was slowly weaned off.  Patient was extubated on 07/31/2020 and transferred to the hospitalist service on 08/30/2020.   Assessment & Plan:   Principal Problem:   Acute ST elevation myocardial infarction (STEMI) due to occlusion of left anterior descending (LAD) coronary artery (HCC) Active Problems:   Cardiac arrest [facility])   STEMI involving left anterior descending coronary artery (HCC)   Protein calorie malnutrition (HCC)   Acute hypoxemic respiratory failure (HCC)   Ischemic cardiomyopathy   Aspiration pneumonia (HCC)   Pulmonary edema cardiac cause (HCC)   Acute kidney injury (HCC)   Shock liver   Cardiogenic shock (HCC)   Transaminitis   Protein-calorie malnutrition, severe   Pleural effusion   Cardiac arrest Ventricular fibrillation STEMI CAD Patient presenting to ED after being found down at home by family members.  On EMS arrival, patient was noted to be in ventricular fibrillation, CPR was initiated and received 5 shocks with associated epinephrine and amiodarone with achievement of ROSC in about 15 minutes.  Patient underwent emergent heart catheterization and revascularization of LAD with DES.  Patient was intubated and under care in the intensive care unit with  post resuscitative targeted temperature management. --Cardiology following, appreciate assistance --Carvedilol 3.125mg  BID --Digoxin 0.125mg  daily --Continue aspirin, Brilinta, Crestor  Ischemic cardiomyopathy Cardiogenic shock TTE 07/21/2020 with LVEF 30-35%, grade 1 diastolic dysfunction.  Initially required milrinone drip in the intensive care unit. 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  No evidence of hypoxic/ischemic injury.  Hemoglobin A1c 6.5.  LDL 108.  TTE with no intra-atrial shunt.   --Neurology consulted --Continue aspirin, Brilinta, and Crestor --Continue to monitor on telemetry, remains in NSR, no findings of atrial fibrillation/flutter (May need LINQ monitor prior to discharge.  Acute respiratory failure with hypoxia, POA: Resolved Healthcare associated pneumonia Chest x-ray notable for diffuse bilateral pulmonary infiltrates.  Patient completed extended antibiotic course with combination vancomycin, Zosyn, and ceftazidime.  Supplemental oxygen has been titrated off and now oxygenating well on room air.  Right pleural effusion Underwent thoracentesis on 08/01/2020 with 4 to 50 mL of fluid removal.  Follow-up chest x-ray 6/2 with resolution of effusion.  Anemia of chronic disease Patient transfused 1 unit PRBC on 07/27/2020. --Hemoglobin stable  Hypernatremia: Resolved Etiology likely secondary to free water deficit, poor oral intake and dysphagia.   --Core track tube has become dislodged overnight, nursing to attempt to regain access versus need for tube replacement --Consider tube feeds with free water once core track tube fixed versus replaced  Acute metabolic encephalopathy Etiology likely secondary to cardiac arrest with prolonged downtime, suspicious of anoxic brain injury.  CT head on admission with hypodensity deep white matter  frontal lobes bilaterally likely  secondary to chronic microvascular ischemia, no cerebral edema.  EEG negative for epileptiform activity.  Despite improvement and normalization of electrolytes/sodium level, patient remains confused, lethargic requiring mittens and abdominal restraints to prevent removal of medical equipment. --MRI brain: Pending --Continue supportive care  Severe oropharyngeal dysphagia Underwent modified barium swallow on 08/06/2020 --Speech therapy following --Continue nothing by mouth --Core track in place --Continue tube feeds --Will need PEG tube if family in agreement; waiting for patients son for family approval.  Tobacco use disorder --Nicotine patch  Suspected COPD No official diagnosis outpatient.  By family reports long standing history of significant tobacco use. --DuoNebs as needed shortness of breath/wheezing  Ethics: Patient continues to make very poor progress following his cardiac/V. fib arrest with associated new onset ischemic cardiomyopathy requiring DES and a course of vasopressors, and findings of acute versus subacute left frontal CVA.  Patient continues with severe dysphagia requiring core track tube for tube feeds.  Palliative care has been consulted and following during hospital course for assistance with goals of care and medical decision making.  Family meeting on 08/07/2020 with patient's son and daughter to discuss options moving forward.  Discussed options to include a more palliative/comfort approach or continued aggressive treatment which will require PEG tube.  They will discuss this with the remainder of their family.     DVT prophylaxis: enoxaparin (LOVENOX) injection 30 mg Start: 07/29/20 1000 SCDs Start: 07/25/20 [id number]    Code Status: Full Code Family Communication: Updated daughter and son with palliative care team  Disposition Plan:  Level of care: Progressive Status is: Inpatient  Remains inpatient appropriate because:Altered  mental status, Ongoing diagnostic testing needed not appropriate for outpatient work up, Unsafe d/c plan, IV treatments appropriate due to intensity of illness or inability to take PO and Inpatient level of care appropriate due to severity of illness   Dispo: The patient is from: Home              Anticipated d/c is to: SNF              Patient currently is not medically stable to d/c.   Difficult to place patient No   Consultants:   PCCM  Cardiology  Procedures:   Cardiac catheterization 5/21, with placement DES to LAD  Intubation 5/21  Targeted temperature management 5/21  Right IJ central line placement 5/21  Arterial line  Extubated 5/31  Antimicrobials:   Vancomycin 5/31 - 6/6  Cefepime 5/31 - 6/6  Zosyn 5/21 - 5/28  Anti-infectives (From admission, onward)   Start     Dose/Rate Route Frequency Ordered Stop   08/03/20 2330  vancomycin (VANCOREADY) IVPB 750 mg/150 mL        750 mg 150 mL/hr over 60 Minutes Intravenous Every 12 hours 08/03/20 1206 08/06/20 2235   08/03/20 1000  cefTAZidime (FORTAZ) 2 g in sodium chloride 0.9 % 100 mL IVPB        2 g 200 mL/hr over 30 Minutes Intravenous Every 8 hours 08/03/20 0937 08/06/20 1453   07/31/20 1000  cefTAZidime (FORTAZ) 2 g in sodium chloride 0.9 % 100 mL IVPB  Status:  Discontinued        2 g 200 mL/hr over 30 Minutes Intravenous Every 12 hours 07/31/20 0808 08/03/20 0937   07/31/20 0930  vancomycin (VANCOREADY) IVPB 1000 mg/200 mL  Status:  Discontinued        1,000 mg 200 mL/hr over 60 Minutes Intravenous Every 24 hours 07/31/20 0808 08/03/20 1206  07/21/20 0630  piperacillin-tazobactam (ZOSYN) IVPB 3.375 g        3.375 g 12.5 mL/hr over 240 Minutes Intravenous Every 8 hours 07/21/20 0531 07/28/20 0100   07/21/20 0615  piperacillin-tazobactam (ZOSYN) IVPB 2.25 g  Status:  Discontinued        2.25 g 100 mL/hr over 30 Minutes Intravenous Every 8 hours 07/21/20 0519 07/21/20 0531        Subjective Patient  seen examined bedside, resting comfortably.  Interview assisted with video interpreter,Ali #[id number]. Sitter present.  Has abdominal restraint and mittens in place due to pulling at core track tube and IV lines.  Son present at bedside this morning, updated regarding findings of MRI.  Discussed need for transition from core track to PEG tube given core track had been clogged overnight.  He states needs to talk to other family members before proceeding.  No acute concerns overnight per nursing staff.  Objective: Vitals:   08/08/20 0915 08/08/20 1015 08/08/20 1100 08/08/20 1345  BP: 90/62 95/61 113/82 103/79  Pulse:   (!) 112 (!) 109  Resp:   19   Temp:   98.2 F (36.8 C)   TempSrc:   Oral   SpO2:   97% 97%  Weight:      Height:        Intake/Output Summary (Last 24 hours) at 08/08/2020 1403 Last data filed at 08/07/2020 1729 Gross per 24 hour  Intake --  Output 1000 ml  Net -1000 ml   Filed Weights   08/07/20 0356 08/08/20 0500 08/08/20 0635  Weight: 48.7 kg 48.5 kg 46.6 kg    Examination:  General exam: Appears calm and comfortable, confused, thin/cachectic, chronically ill in appearance Respiratory system: Clear to auscultation. Respiratory effort normal.  On room air Cardiovascular system: S1 & S2 heard, RRR. No JVD, murmurs, rubs, gallops or clicks. No pedal edema. Gastrointestinal system: Abdomen is nondistended, soft and nontender. No organomegaly or masses felt. Normal bowel sounds heard. Central nervous system: Alert, oriented to place ([place]), his name, otherwise unable to state situation or time. No focal neurological deficits. Extremities: Moves all extremities independently Skin: No rashes, lesions or ulcers Psychiatry: Judgement and insight appear poor.     Data Reviewed: I have personally reviewed following labs and imaging studies  CBC: Recent Labs  Lab 08/03/20 0400 08/04/20 0420 08/05/20 0304 08/06/20 2044 08/08/20 0457  WBC 15.3* 15.1* 14.1* 11.0* 12.2*   NEUTROABS  --   --   --  8.2*  --   HGB 9.4* 9.9* 10.0* 10.5* 11.7*  HCT 29.8* 31.2* 31.5* 33.4* 38.3*  MCV 94.9 94.5 94.9 94.6 97.5  PLT 448* 435* 406* 348 359   Basic Metabolic Panel: Recent Labs  Lab 08/04/20 0420 08/05/20 0304 08/06/20 0422 08/07/20 0403 08/08/20 0457  NA 148* 145 143 140 139  K 3.6 3.8 3.8 4.2 4.5  CL 120* 118* 117* 110 108  CO2 23 23 23 23  20*  GLUCOSE 200* 218* 222* 125* 76  BUN 28* 26* 21 24* 23  CREATININE 0.81 0.76 0.68 0.60* 0.75  CALCIUM 8.2* 8.1* 8.0* 8.3* 8.2*  MG  --   --   --   --  2.2   GFR: Estimated Creatinine Clearance: 59.9 mL/min (by C-G formula based on SCr of 0.75 mg/dL). Liver Function Tests: Recent Labs  Lab 08/01/20 1440  PROT 6.0*   No results for input(s): LIPASE, AMYLASE in the last 168 hours. No results for input(s): AMMONIA in the last 168  hours. Coagulation Profile: Recent Labs  Lab 08/02/20 0354 08/03/20 0400 08/04/20 0420 08/05/20 0304 08/06/20 0422  INR 1.2 1.2 1.2 1.1 1.1   Cardiac Enzymes: No results for input(s): CKTOTAL, CKMB, CKMBINDEX, TROPONINI in the last 168 hours. BNP (last 3 results) No results for input(s): PROBNP in the last 8760 hours. HbA1C: No results for input(s): HGBA1C in the last 72 hours. CBG: Recent Labs  Lab 08/07/20 2335 08/08/20 0312 08/08/20 0458 08/08/20 0808 08/08/20 1206  GLUCAP 81 77 80 88 99   Lipid Profile: No results for input(s): CHOL, HDL, LDLCALC, TRIG, CHOLHDL, LDLDIRECT in the last 72 hours. Thyroid Function Tests: No results for input(s): TSH, T4TOTAL, FREET4, T3FREE, THYROIDAB in the last 72 hours. Anemia Panel: No results for input(s): VITAMINB12, FOLATE, FERRITIN, TIBC, IRON, RETICCTPCT in the last 72 hours. Sepsis Labs: No results for input(s): PROCALCITON, LATICACIDVEN in the last 168 hours.  Recent Results (from the past 240 hour(s))  Culture, Respiratory w Gram Stain     Status: None   Collection Time: 07/31/20  8:00 AM   Specimen: Tracheal  Aspirate; Respiratory  Result Value Ref Range Status   Specimen Description TRACHEAL ASPIRATE  Final   Special Requests NONE  Final   Gram Stain   Final    FEW WBC PRESENT, PREDOMINANTLY MONONUCLEAR NO ORGANISMS SEEN    Culture   Final    NO GROWTH 2 DAYS Performed at [facility], [address], [place], [place] [address]    Report Status 08/04/2020 FINAL  Final  Body fluid culture w Gram Stain     Status: None   Collection Time: 08/01/20  2:40 PM   Specimen: Fluid  Result Value Ref Range Status   Specimen Description FLUID PLEURAL  Final   Special Requests NONE  Final   Gram Stain   Final    FEW WBC PRESENT,BOTH PMN AND MONONUCLEAR NO ORGANISMS SEEN    Culture   Final    NO GROWTH 3 DAYS Performed at [facility], [address] [place], [place] [address]    Report Status 08/05/2020 FINAL  Final         Radiology Studies: MR BRAIN WO CONTRAST  Result Date: 08/07/2020 CLINICAL DATA:  Persistent altered mental status post cardiac arrest EXAM: MRI HEAD WITHOUT CONTRAST TECHNIQUE: Multiplanar, multiecho pulse sequences of the brain and surrounding structures were obtained without intravenous contrast. COMPARISON:  None. FINDINGS: Brain: Punctate focus of reduced diffusion in the left frontal lobe. Prominence of the ventricles and sulci reflects minor generalized parenchymal volume loss. Few small foci of T2 hyperintensity in the supratentorial white matter are nonspecific but may reflect minor chronic microvascular ischemic changes. There is a chronic small vessel infarct of the posterior left putamen. There is no evidence of intracranial hemorrhage. There is no intracranial mass or mass effect. There is no hydrocephalus or extra-axial fluid collection. Vascular: Major vessel flow voids at the skull base are preserved. Skull and upper cervical spine: Normal marrow signal is preserved. Sinuses/Orbits: Mild mucosal thickening.  Right lens replacement. Other: Sella is  unremarkable.  Patchy mastoid fluid opacification. 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 No evidence of hypoxic/ischemic injury. Electronically Signed   By: Guadlupe Spanish M.D.   On: 08/07/2020 18:57        Scheduled Meds: . aspirin  81 mg Per Tube Daily  . carvedilol  3.125 mg Per Tube BID WC  . chlorhexidine  15 mL Mouth Rinse BID  .  clonazepam  0.5 mg Per Tube TID  . digoxin  0.125 mg Per Tube Daily  . enoxaparin (LOVENOX) injection  30 mg Subcutaneous Daily  . famotidine  20 mg Per Tube BID  . feeding supplement (PROSource TF)  45 mL Per Tube BID  . free water  100 mL Per Tube Q4H  . insulin aspart  0-15 Units Subcutaneous Q4H  . insulin aspart  3 Units Subcutaneous Q4H  . lipase/protease/amylase)  20,880 Units Per Tube Once   And  . sodium bicarbonate  650 mg Per Tube Once  . mouth rinse  15 mL Mouth Rinse q12n4p  . nicotine  14 mg Transdermal Daily  . oxyCODONE  5 mg Per Tube Q8H  . rosuvastatin  40 mg Per Tube Daily  . ticagrelor  90 mg Per Tube BID   Continuous Infusions: . sodium chloride Stopped (08/06/20 0521)  . feeding supplement (OSMOLITE 1.5 CAL) 1,000 mL (08/06/20 2131)     LOS: 18 days    Time spent: 39 minutes spent on chart review, discussion with nursing staff, consultants, updating family and interview/physical exam; more than 50% of that time was spent in counseling and/or coordination of care.    Alvira Philips [place], DO [facility] Available via Epic secure chat 7am-7pm After these hours, please refer to coverage provider listed on [email or web address] 08/08/2020, 2:03 PM

## 2020-08-08 NOTE — Progress Notes (Signed)
HOSPITAL MEDICINE OVERNIGHT EVENT NOTE    Notified by nursing that patient's core track has become called this evening, ever since the patient returned from MRI.  Nursing has attempted multiple methods at trying to dislodge the suspected obstruction including administration of enzymes, administration of Coca[employer] and manually massaging the feeding tube all of which have been ineffective.  I have placed an order for the core track to be replaced in the morning by the appropriate core track placement team.  In the meantime, placing patient on low-dose scheduled intravenous opiates and anxiolytics to get the patient through the evening shift.  We will additionally order low rate intravenous fluids for the remainder of the evening shift.  Marinda Elk  MD [facility]

## 2020-08-08 NOTE — Progress Notes (Signed)
    Progress Note from the Palliative Medicine Team at [facility]   Patient Name: Vincent Horton        Date: 08/08/2020 DOB: 21-Jun-1955  Age: 65 y.o. MRN#: [id number] Attending Physician: [place] Eric J, DO Primary Care Physician: No primary care provider on file. Admit Date: 07/21/2020   Medical records reviewed   65 year old male with limited medical history found down by family, EMS was called, he was found to be in V. fib shocked 5 x 3 mg epinephrine 450 mg of amiodarone with ROSC obtained. He was emergently taken to the Cath Lab showing ST segment changes in anterior lateral leads status post revascularization of LAD.He was intubated on 07/21/2020 andtransferred to the ICU. He was extubated successfully  on 07/31/2020   Today is day 17 of this hospitalization.  Patient remains weak with severe dysphagia, currently core track in place for nutritional and hydration support.  He is intermittently confused, sitter at bedside  Family face treatment option decisions, advanced directive decisions and anticipatory care needs.   This NP visited patient at the bedside as a follow up to  yesterday's [place].  I met with patient's daughter in person and son his son by telephone for continued goals of care conversation regarding current medical situation; diagnosis,  prognosis, Goals of care , end-of-life wishes, disposition and options.  Dr. [place] updated family.  This nurse practitioner offered education regarding risks and benefits of artificial feeding.  Education offered on aspiration risk with or with out artificial feeding/PEG placement Concept specific to adult failure to thrive and overall limitations of medical interventions to prolong quality of life when the body does fail to thrive.  Education offered on the difference between aggressive medical intervention path and a palliative comfort path for this patient at this time in this situation detailed.  Education offered and  exploration of anticipatory care needs.  Both son and daughter verbalized understanding of the medical situation ,they have 8 other sibling who weigh in on any decisions related to [name].    MOST form introduced and left for review.  Hard choices booklet left for review  Discussed with family  the importance of continued conversation with all family members and the  medical providers regarding overall plan of care and treatment options,  ensuring decisions are within the context of the patients values and GOCs.  Questions and concerns addressed   Discussed with Dr [place] and bedside RN  Total time spent on the unit was 35 minutes  Greater than 50% of the time was spent in counseling and coordination of care  Wadie Lessen NP  Palliative Medicine Team Team Phone # [phone] Pager [phone]

## 2020-08-08 NOTE — Progress Notes (Signed)
Physical Therapy Treatment Patient Details Name: Vincent Horton MRN: [id number] DOB: Nov 19, 1955 Today's Date: 08/08/2020    History of Present Illness Pt is a 65 y.o. male seen on 07/21/2020 for evaluation of STEMI. Upon EMS arrival, pt apneic and was in VFib and shocked 5 times with 15-min CPR before ROSC achieved. Pt intubated on site (5/21) and transferred to [facility]. CXR with diffuse bilateral pulmonary infiltrates/edema, atelectasis, small R pleural effusion. ETT 5/21-5/31. Course complicated by acute metabolic encephalopathy; suspect secondary to cardiac arrest with prolonged downtime, suspicious of anoxic brain injury. Head CT with hypodensity deep white matter bilateral frontal lobes, likely secondary to chronic microvascular ischemia; no cerebral edema. EEG negative for epileptiform activity. MRI 6/7 with punctate acute to subacute L frontal infarct; no evidence of hypoxic/ischemic injury. PMH includes cardiac arrest, CAD, dysrhythmia, MI.   PT Comments    Pt continues to progress with mobility; limited by increased fatigue, specifically BLE fatigue/soreness, this session, but remains motivated to participate. Today's session focused on transfer and gait training, pt requiring min-maxA to maintain stability with standing activity. Pt remains limited by generalized weakness, decreased activity tolerance, poor balance strategies and cognitive impairments. Pt remains an excellent candidate for intensive CIR-level therapies to maximize functional mobility and independence prior to return home.  HR 90s-100s, SpO2 97-100% on RA    Follow Up Recommendations  CIR;Supervision for mobility/OOB     Equipment Recommendations   (TBD)    Recommendations for Other Services       Precautions / Restrictions Precautions Precautions: Fall;Other (comment) Precaution Comments: cortrak Restrictions Weight Bearing Restrictions: No    Mobility  Bed Mobility               General bed mobility comments:  Received sitting in recliner    Transfers Overall transfer level: Needs assistance Equipment used: None;1 person hand held assist Transfers: Sit to/from Stand Sit to Stand: Mod assist;Min assist         General transfer comment: Multiple sit<>stands from reclliner without DME, pt hesitant to remove UE support from armrest requiring consistent external assist to maintain stability once standing fully upright  Ambulation/Gait Ambulation/Gait assistance: Mod assist;Max assist;+2 safety/equipment (chair follow) Gait Distance (Feet): 12 Feet (+ 28' + 14') Assistive device: None;1 person hand held assist Gait Pattern/deviations: Step-through pattern;Decreased stride length;Shuffle;Trunk flexed;Narrow base of support;Staggering right;Staggering left Gait velocity: Decreased   General Gait Details: Slow, unsteady gait with intermittent HHA or UE support on hallway rail, pt fluctuating from min-maxA to maintain balance; intermittent bilateral knee buckling noted with staggering R/L requiring assist to maintain upright; 3x seated rest breaks with chair follow, pt endorses fatigue   Stairs             Wheelchair Mobility    Modified Rankin (Stroke Patients Only) Modified Rankin (Stroke Patients Only) Pre-Morbid Rankin Score: No symptoms Modified Rankin: Moderately severe disability     Balance Overall balance assessment: Needs assistance Sitting-balance support: No upper extremity supported;Bilateral upper extremity supported;Feet supported Sitting balance-Leahy Scale: Fair Sitting balance - Comments: Can static sit edge of recliner without UE support, likely unable to accept significant challenge or reach outside BOS   Standing balance support: No upper extremity supported;During functional activity;Single extremity supported Standing balance-Leahy Scale: Poor Standing balance comment: Reliant on external assist to maintain static standing balance                             Cognition Arousal/Alertness: Awake/alert  Behavior During Therapy: Flat affect Overall Cognitive Status: Impaired/Different from baseline Area of Impairment: Attention;Following commands;Safety/judgement;Awareness;Problem solving                   Current Attention Level: Sustained;Selective   Following Commands: Follows one step commands consistently;Follows one step commands with increased time Safety/Judgement: Decreased awareness of safety;Decreased awareness of deficits Awareness: Intellectual;Emergent Problem Solving: Difficulty sequencing;Requires verbal cues;Requires tactile cues General Comments: Minimal audible verbal communication beyond head nods and quietly talking a few sentences in native language to son; but following commands in [place]. Pt still easily distracted by lines, but less so than prior session. Difficulty unlocking iphone despite repeated cues from son for password/buttons to press. Increased flat affect, suspect related to increased fatigue      Exercises General Exercises - Lower Extremity Long Arc Quad: AROM;Both;Seated Hip Flexion/Marching: AROM;Both;Seated Toe Raises: AROM;Both;Seated Heel Raises: AROM;Both;Seated Other Exercises Other Exercises: 5x repeated sit<>stands with UE support and external assist    General Comments General comments (skin integrity, edema, etc.): [name] present and supportive. Pt received on 2L O2 with SpO2 100%; O2 removed and pt maintaining 97-100% on RA with activity and rest, kept O2 off (NT aware); HR 90s-100s      Pertinent Vitals/Pain Pain Assessment: Faces Faces Pain Scale: Hurts a little bit Pain Location: Bilateral knees/thighs Pain Descriptors / Indicators: Tiring;Sore Pain Intervention(s): Monitored during session;Limited activity within patient's tolerance    Home Living                      Prior Function            PT Goals (current goals can now be found in the care plan  section) Progress towards PT goals: Progressing toward goals    Frequency    Min 4X/week      PT Plan Frequency needs to be updated    Co-evaluation              AM-PAC PT "6 Clicks" Mobility   Outcome Measure  Help needed turning from your back to your side while in a flat bed without using bedrails?: A Little Help needed moving from lying on your back to sitting on the side of a flat bed without using bedrails?: A Little Help needed moving to and from a bed to a chair (including a wheelchair)?: A Lot Help needed standing up from a chair using your arms (e.g., wheelchair or bedside chair)?: A Lot Help needed to walk in hospital room?: A Lot Help needed climbing 3-5 steps with a railing? : Total 6 Click Score: 13    End of Session Equipment Utilized During Treatment: Gait belt Activity Tolerance: Patient tolerated treatment well Patient left: in chair;with call bell/phone within reach;with chair alarm set;with nursing/sitter in room;with family/visitor present Nurse Communication: Mobility status PT Visit Diagnosis: Unsteadiness on feet (R26.81);Other abnormalities of gait and mobility (R26.89);Muscle weakness (generalized) (M62.81)     Time: 4665-9935 PT Time Calculation (min) (ACUTE ONLY): 32 min  Charges:  $Gait Training: 8-22 mins $Therapeutic Activity: 8-22 mins                     Ina Homes, PT, DPT Acute Rehabilitation Services  Pager [phone] Office [phone]  Malachy Chamber 08/08/2020, 2:52 PM

## 2020-08-08 NOTE — Evaluation (Signed)
Speech Language Pathology Evaluation Patient Details Name: Vincent Horton MRN: [id number] DOB: 07-Jul-1955 Today's Date: 08/08/2020 Time: 7106-2694 SLP Time Calculation (min) (ACUTE ONLY): 30 min  Problem List:  Patient Active Problem List   Diagnosis Date Noted  . Pleural effusion   . Protein-calorie malnutrition, severe 07/25/2020  . Acute hypoxemic respiratory failure (HCC) 07/24/2020  . Ischemic cardiomyopathy 07/24/2020  . Aspiration pneumonia (HCC) 07/24/2020  . Pulmonary edema cardiac cause (HCC) 07/24/2020  . Acute kidney injury (HCC) 07/24/2020  . Shock liver 07/24/2020  . Cardiogenic shock (HCC) 07/24/2020  . Transaminitis 07/24/2020  . Protein calorie malnutrition (HCC) 07/23/2020  . Acute ST elevation myocardial infarction (STEMI) due to occlusion of left anterior descending (LAD) coronary artery (HCC) 07/21/2020  . Cardiac arrest (HCC) 07/21/2020  . STEMI involving left anterior descending coronary artery (HCC) 07/21/2020   Past Medical History:  Past Medical History:  Diagnosis Date  . Cardiac arrest (HCC)   . Coronary artery disease   . Dysrhythmia   . Myocardial infarction [facility])    Past Surgical History:  Past Surgical History:  Procedure Laterality Date  . CARDIAC CATHETERIZATION    . CORONARY/GRAFT ACUTE MI REVASCULARIZATION N/A 07/21/2020   Procedure: Coronary/Graft Acute MI Revascularization;  Surgeon: [place], Peter M, MD;  Location: [facility] INVASIVE CV LAB;  Service: Cardiovascular;  Laterality: N/A;  . LEFT HEART CATH AND CORONARY ANGIOGRAPHY N/A 07/21/2020   Procedure: LEFT HEART CATH AND CORONARY ANGIOGRAPHY;  Surgeon: [place], Peter M, MD;  Location: [facility] INVASIVE CV LAB;  Service: Cardiovascular;  Laterality: N/A;  . RIGHT HEART CATH N/A 07/21/2020   Procedure: RIGHT HEART CATH;  Surgeon: [place], Peter M, MD;  Location: [facility] INVASIVE CV LAB;  Service: Cardiovascular;  Laterality: N/A;   HPI:  Pt is a 65 yo male found down by family VF arrest associated with STEMI,  shocked 5x, 15 min CPR before ROSC achieved. ETT 5/21-5/31. CXR 5/31 with persistent bibasilar infiltrates/edema. PMH includes cardiac arrest, CAD, dysrhythmia, myocardial infarction. Per pt's son, pt's oral intake at home was limited to very soft and/or liquidised foods PTA due to condition of dentition.   Assessment / Plan / Recommendation Clinical Impression  Pt was evaluated with family present (son in person, others via phone), also utilizing video interpreter [facility] [phone]). He is more alert today and more consistently following concrete commands. He is oriented to location, year, and person, but does not know why he is in the hospital. Throughout the eval he repetitively asks to go back to bed and is impulsive as he feeds himself. Pt is very dysphonic (largely aphonic) s/p extubation, which limits intelligibility both in person and with interpreter. He does seem to fluctuate between responses in [place] and his native language. Pt will benefit from ongoing cognitive tx with emphasis on differential diagnosis of abilities.    SLP Assessment  SLP Recommendation/Assessment: Patient needs continued Speech Lanaguage Pathology Services SLP Visit Diagnosis: Cognitive communication deficit (R41.841)    Follow Up Recommendations  Inpatient Rehab    Frequency and Duration min 2x/week  2 weeks      SLP Evaluation Cognition  Overall Cognitive Status: Impaired/Different from baseline Arousal/Alertness: Awake/alert Orientation Level: Oriented to person;Oriented to place;Oriented to time;Disoriented to situation Attention: Sustained Sustained Attention: Impaired Sustained Attention Impairment: Verbal basic Memory: Impaired Memory Impairment: Decreased recall of new information Awareness: Impaired Awareness Impairment: Intellectual impairment;Emergent impairment Behaviors: Impulsive Safety/Judgment: Impaired       Comprehension  Auditory Comprehension Overall Auditory Comprehension:  Impaired Commands: Impaired One  Step Basic Commands: 75-100% accurate    Expression Expression Primary Mode of Expression: Verbal Verbal Expression Overall Verbal Expression: Other (comment) (needs more assessment)   Oral / Motor  Motor Speech Overall Motor Speech:  (impaired phonation impacting intelligibility)   GO                    Mahala Menghini., M.A. CCC-SLP Acute Rehabilitation Services Pager [phone] Office [phone]  08/08/2020, 4:25 PM

## 2020-08-08 NOTE — Progress Notes (Signed)
    Progress Note from the Palliative Medicine Team at [facility]   Patient Name: Vincent Horton        Date: 08/08/2020 DOB: 1955/04/08  Age: 65 y.o. MRN#: [id number] Attending Physician: [place], Eric J, DO Primary Care Physician: No primary care provider on file. Admit Date: 07/21/2020   Medical records reviewed   65 year old male with limited medical history found down by family, EMS was called, he was found to be in V. fib shocked 5 x 3 mg epinephrine 450 mg of amiodarone with ROSC obtained. He was emergently taken to the Cath Lab showing ST segment changes in anterior lateral leads status post revascularization of LAD.He was intubated on 07/21/2020 andtransferred to the ICU. He was extubated successfully  on 07/31/2020    Patient remains weak with severe dysphagia, currently core track in place for nutritional and hydration support.  He is intermittently confused, sitter at bedside.  Speech therapy currently working with patient he is out of bed in the chair    Son [name] at bedside  Family face treatment option decisions, advanced directive decisions and anticipatory care needs.   This NP visited patient at the bedside as a follow up to for palliative medicine needs and emotional support.  Family understand the overall medical situation and the decisions they face.  This nurse practitioner offered education regarding risks and benefits of artificial feeding.  Education offered on aspiration risk with or with out artificial feeding/PEG placement Concept specific to adult failure to thrive and overall limitations of medical interventions to prolong quality of life when the body does fail to thrive.  Discussed with family  the importance of continued conversation with all family members and the  medical providers regarding overall plan of care and treatment options,  ensuring decisions are within the context of the patients values and GOCs.  Questions and concerns addressed     This  nurse practitioner informed  the family and the attending that I will be out of the hospital until Monday morning.  If the patient is still hospitalized I will follow-up at that time.  Call palliative medicine team phone # [phone] with questions or concerns in the interim   Total time spent on the unit was 15 minutes  Greater than 50% of the time was spent in counseling and coordination of care  Lorinda Creed NP  Palliative Medicine Team Team Phone # [phone] Pager [phone]

## 2020-08-08 NOTE — Progress Notes (Signed)
Inpatient Rehab Admissions Coordinator:   Met with pt and his son at the bedside, daughter on the phone during conversation.  Pt sleeps throughout, still in mittens.  I explained goals/expecations of CIR including average length of stay about 2 weeks, with goals of supervision.  Family is in agreement and can provide this for patient.  Will continue to follow for timing of possible admission pending bed availability over the next few days.   Shann Medal, PT, DPT Admissions Coordinator [phone] 08/08/20  12:30 PM

## 2020-08-08 NOTE — TOC Progression Note (Signed)
Transition of Care (TOC) - Progression Note  Heart Failure   Patient Details  Name: Vincent Horton MRN: [id number] Date of Birth: 05/16/1955  Transition of Care [facility]) CM/SW Contact  Renella Steig, LCSWA Phone Number: 08/08/2020, 9:44 AM  Clinical Narrative:    CSW completed a chart review to see if the patient still has a cortrak as this is a barrier for SNF placement. Patient still has cortrak and now PT is again recommending CIR. CSW sent a secure chat to the CIR admission's coordinator to make them aware of the recommendation.  CSW will continue to follow throughout discharge.    Expected Discharge Plan: Skilled Nursing Facility Barriers to Discharge: Continued Medical Work up  Expected Discharge Plan and Services Expected Discharge Plan: Skilled Nursing Facility In-house Referral: Clinical Social Work                                             Social Determinants of Health (SDOH) Interventions Housing Interventions: Intervention Not Indicated  Readmission Risk Interventions No flowsheet data found.  Joy Haegele, MSW, LCSWA [phone] Heart Failure Social Worker

## 2020-08-08 NOTE — Progress Notes (Addendum)
Cortrak Tube Team Note:   Addendum 08/08/20 @ 1631: per xray, "Tip of the weighted enteric tube in the right upper quadrant in the region of the distal stomach." Tip is OK for use. RN made aware.   Consult received due to clogged feeding tube. RD able to unclog tube at bedside. Note, tube was initially bridled at 81cm per RD documentation, but was observed to be bridled at 70cm at time of this RD's visit. This RD did not reposition Cortrak during encounter, but given displacement from initial documentation, recommend obtaining xray before resuming use of Cortrak tube.   X-ray is required, abdominal x-ray has been ordered by the Cortrak team. Please confirm tube placement before using the Cortrak tube.   If the tube becomes dislodged please keep the tube and contact the Cortrak team at [email or web address] (password [email or web address]) for replacement.  If after hours and replacement cannot be delayed, place a NG tube and confirm placement with an abdominal x-ray.    Eugene Gavia, MS, RD, LDN RD pager number and weekend/on-call pager number located in [place].

## 2020-08-08 NOTE — Progress Notes (Signed)
Neurology Attempted Consult Note  Neurology requested to consult on patient regarding prognostication after cardiac arrest and punctate subacute frontal infarct on MRI brain. Consult visit was attempted but son/HCPOA at bedside was taking an important conference call for work and was unavailable for discussion. Will reattempt tomorrow when family available to provide collateral and discuss GOC.  Bing Neighbors, MD [facility] [phone]  If 7pm- 7am, please page neurology on call as listed in AMION.

## 2020-08-08 NOTE — Progress Notes (Signed)
Occupational Therapy Treatment Patient Details Name: Vincent Horton MRN: [id number] DOB: 1955/03/16 Today's Date: 08/08/2020    History of present illness Pt is a 65 y.o. male seen on 07/21/2020 for evaluation of STEMI. Upon EMS arrival, pt apneic and was in VFib and shocked 5 times with 15-min CPR before ROSC achieved. Pt intubated on site (5/21) and transferred to [facility]. CXR with diffuse bilateral pulmonary infiltrates/edema, atelectasis, small R pleural effusion. ETT 5/21-5/31. Course complicated by acute metabolic encephalopathy; suspect secondary to cardiac arrest with prolonged downtime, suspicious of anoxic brain injury. Head CT with hypodensity deep white matter bilateral frontal lobes, likely secondary to chronic microvascular ischemia; no cerebral edema. EEG negative for epileptiform activity. MRI 6/7 with punctate acute to subacute L frontal infarct; no evidence of hypoxic/ischemic injury. PMH includes cardiac arrest, CAD, dysrhythmia, MI.   OT comments  Pt making incremental progress with OT goals this session. Pt was motivated and agreeable to work on OOB activities with therapy today. Pt completed multiple sit to stands, took a few steps, and transferred to the recliner to look out of the window. Pt was more responsive this session, nodding yes/no to questions and reporting that he was feeling weak and off balance when attempting to ambulate. Additionally, pt following all commands this session and responding to verbal/tactile cues. Acute OT will continue to follow to assist with progressing pt goals.    Follow Up Recommendations  CIR    Equipment Recommendations  Other (comment)    Recommendations for Other Services      Precautions / Restrictions Precautions Precautions: Fall;Other (comment) Precaution Comments: cortrak Restrictions Weight Bearing Restrictions: No       Mobility Bed Mobility Overal bed mobility: Needs Assistance Bed Mobility: Supine to Sit     Supine to sit:  Min guard;HOB elevated     General bed mobility comments: No assist provided to complete sup<>sit, HOB elevated and min guard for safety due to pt leaning posteriorly on initial sit.    Transfers Overall transfer level: Needs assistance Equipment used: Rolling walker (2 wheeled) Transfers: Sit to/from [employer] Sit to Stand: Mod assist Stand pivot transfers: Mod assist       General transfer comment: sit to stand x2 from elevated bed and x1 from recliner. Pt requires tactile cues for hand placment    Balance Overall balance assessment: Needs assistance Sitting-balance support: Bilateral upper extremity supported;Feet supported Sitting balance-Leahy Scale: Fair Sitting balance - Comments: Can static sit edge of recliner without UE support, likely unable to accept significant challenge or reach outside BOS Postural control: Posterior lean Standing balance support: Bilateral upper extremity supported Standing balance-Leahy Scale: Poor Standing balance comment: Reliant on external assist to maintain static standing balance                           ADL either performed or assessed with clinical judgement   ADL Overall ADL's : Needs assistance/impaired                         Toilet Transfer: Moderate assistance Toilet Transfer Details (indicate cue type and reason): Pt simulated stand pivot transfer to recliner, requiring mod assist to stand and take steps to recliner.         Functional mobility during ADLs: Moderate assistance General ADL Comments: Pt requiring mod verbal and tactile cues for hand placement with RW as well as with RW placement during transfer.  Vision   Vision Assessment?: No apparent visual deficits   Perception     Praxis      Cognition Arousal/Alertness: Awake/alert Behavior During Therapy: Flat affect Overall Cognitive Status: Impaired/Different from baseline Area of Impairment: Attention;Following  commands;Safety/judgement;Awareness;Problem solving                   Current Attention Level: Sustained;Selective   Following Commands: Follows one step commands consistently;Follows one step commands with increased time Safety/Judgement: Decreased awareness of safety;Decreased awareness of deficits Awareness: Intellectual;Emergent Problem Solving: Difficulty sequencing;Requires verbal cues;Requires tactile cues General Comments: Minimal audible verbal communication beyond head nods and quietly talking a few sentences in native language to son; but following commands in [place]. Pt still easily distracted by lines, but less so than prior session. Increased flat affect, suspect related to increased fatigue        Exercises General Exercises - Lower Extremity Long Arc Quad: AROM;Both;Seated Hip Flexion/Marching: AROM;Both;Seated Toe Raises: AROM;Both;Seated Heel Raises: AROM;Both;Seated Other Exercises Other Exercises: MArching seated, BLE, 10reps Other Exercises: Kick outs, seated, BLE, 10 reps   Shoulder Instructions       General Comments VSS on 2L. Son present and supportive, providing interpretation as needed.    Pertinent Vitals/ Pain       Pain Assessment: Faces Faces Pain Scale: Hurts a little bit Pain Location: Bilateral knees/thighs Pain Descriptors / Indicators: Grimacing;Guarding Pain Intervention(s): Limited activity within patient's tolerance;Monitored during session;Repositioned  Home Living     Available Help at Discharge: Family;Available 24 hours/day Type of Home: House                              Lives With: Family    Prior Functioning/Environment              Frequency  Min 2X/week        Progress Toward Goals  OT Goals(current goals can now be found in the care plan section)  Progress towards OT goals: Progressing toward goals  Acute Rehab OT Goals Patient Stated Goal: To walk and go home OT Goal Formulation: With  patient/family Time For Goal Achievement: 08/15/20 Potential to Achieve Goals: Good ADL Goals Pt Will Perform Grooming: sitting;with set-up Pt Will Perform Upper Body Dressing: sitting;with modified independence Pt Will Perform Lower Body Dressing: with min guard assist;sit to/from stand Pt Will Transfer to Toilet: with min guard assist;ambulating Pt Will Perform Toileting - Clothing Manipulation and hygiene: with supervision;sitting/lateral leans Additional ADL Goal #1: Pt will perform bed mobility prior to ADL at independent level  Plan Discharge plan remains appropriate;Frequency remains appropriate    Co-evaluation                 AM-PAC OT "6 Clicks" Daily Activity     Outcome Measure   Help from another person eating meals?: Total Help from another person taking care of personal grooming?: A Little Help from another person toileting, which includes using toliet, bedpan, or urinal?: A Lot Help from another person bathing (including washing, rinsing, drying)?: A Lot Help from another person to put on and taking off regular upper body clothing?: A Little Help from another person to put on and taking off regular lower body clothing?: A Lot 6 Click Score: 13    End of Session Equipment Utilized During Treatment: Gait belt;Rolling walker;Oxygen  OT Visit Diagnosis: Unsteadiness on feet (R26.81);Other abnormalities of gait and mobility (R26.89);Muscle weakness (generalized) (M62.81);Other symptoms and signs involving  cognitive function   Activity Tolerance Patient tolerated treatment well   Patient Left in chair;with call bell/phone within reach;with chair alarm set;with nursing/sitter in room;with family/visitor present   Nurse Communication Mobility status        Time: 7902-4097 OT Time Calculation (min): 25 min  Charges: OT General Charges $OT Visit: 1 Visit OT Treatments $Self Care/Home Management : 8-22 mins $Therapeutic Activity: 8-22 mins  Nadra Hritz H.,  OTR/L Acute Rehabilitation  Jakaila Norment Elane Dequane Strahan 08/08/2020, 4:30 PM

## 2020-08-09 DIAGNOSIS — I639 Cerebral infarction, unspecified: Secondary | ICD-10-CM

## 2020-08-09 LAB — BASIC METABOLIC PANEL
Anion gap: 10 (ref 5–15)
BUN: 30 mg/dL — ABNORMAL HIGH (ref 8–23)
CO2: 24 mmol/L (ref 22–32)
Calcium: 8.1 mg/dL — ABNORMAL LOW (ref 8.9–10.3)
Chloride: 107 mmol/L (ref 98–111)
Creatinine, Ser: 0.84 mg/dL (ref 0.61–1.24)
GFR, Estimated: >60 mL/min (ref >60)
Glucose, Bld: 142 mg/dL — ABNORMAL HIGH (ref 70–99)
Potassium: 4.2 mmol/L (ref 3.5–5.1)
Sodium: 141 mmol/L (ref 135–145)

## 2020-08-09 LAB — GLUCOSE, CAPILLARY
Glucose-Capillary: 119 mg/dL — ABNORMAL HIGH (ref 70–99)
Glucose-Capillary: 129 mg/dL — ABNORMAL HIGH (ref 70–99)
Glucose-Capillary: 160 mg/dL — ABNORMAL HIGH (ref 70–99)
Glucose-Capillary: 176 mg/dL — ABNORMAL HIGH (ref 70–99)
Glucose-Capillary: 201 mg/dL — ABNORMAL HIGH (ref 70–99)
Glucose-Capillary: 224 mg/dL — ABNORMAL HIGH (ref 70–99)
Glucose-Capillary: 240 mg/dL — ABNORMAL HIGH (ref 70–99)

## 2020-08-09 NOTE — Progress Notes (Signed)
PROGRESS NOTE    Vincent Horton  ZOX:[id number] DOB: 09/23/1955 DOA: 07/21/2020 PCP: No primary care provider on file.    Brief Narrative:  Vincent Horton is a 65 year old male with limited past medical history who was initially found down by family at home.  EMS was activated and patient was found to be in ventricular fibrillation.  Patient was shocked 5 times and given epinephrine and amiodarone with ROSC obtained approximately within 15 minutes.  Patient was intubated, started on vasopressors for cardiogenic shock and transferred to the intensive care unit.  Patient also was emergently taken to the catheterization lab due to ST segment changes in anterior leads and underwent revascularization of his LAD.  Milrinone drip was slowly weaned off.  Patient was extubated on 07/31/2020 and transferred to the hospitalist service on 08/30/2020.   Assessment & Plan:   Principal Problem:   Acute ST elevation myocardial infarction (STEMI) due to occlusion of left anterior descending (LAD) coronary artery (HCC) Active Problems:   Cardiac arrest [facility](HCC)   STEMI involving left anterior descending coronary artery (HCC)   Protein calorie malnutrition (HCC)   Acute hypoxemic respiratory failure (HCC)   Ischemic cardiomyopathy   Aspiration pneumonia (HCC)   Pulmonary edema cardiac cause (HCC)   Acute kidney injury (HCC)   Shock liver   Cardiogenic shock (HCC)   Transaminitis   Protein-calorie malnutrition, severe   Pleural effusion   Cardiac arrest Ventricular fibrillation STEMI CAD Patient presenting to ED after being found down at home by family members.  On EMS arrival, patient was noted to be in ventricular fibrillation, CPR was initiated and received 5 shocks with associated epinephrine and amiodarone with achievement of ROSC in about 15 minutes.  Patient underwent emergent heart catheterization and revascularization of LAD with DES.  Patient was intubated and under care in the intensive care unit with  post resuscitative targeted temperature management. --Cardiology following, appreciate assistance --Carvedilol 3.125mg  BID --Digoxin 0.125mg  daily --Continue aspirin, Brilinta, Crestor  Ischemic cardiomyopathy Cardiogenic shock TTE 07/21/2020 with LVEF 30-35%, grade 1 diastolic dysfunction.  Initially required milrinone drip in the intensive care unit. 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  No evidence of hypoxic/ischemic injury.  Hemoglobin A1c 6.5.  LDL 108.  TTE with no intra-atrial shunt.   --Neurology consulted; plan to see today --Continue aspirin, Brilinta, and Crestor --Continue to monitor on telemetry, remains in NSR, no findings of atrial fibrillation/flutter (May need LINQ monitor prior to discharge.  Acute respiratory failure with hypoxia, POA: Resolved Healthcare associated pneumonia Chest x-ray notable for diffuse bilateral pulmonary infiltrates.  Patient completed extended antibiotic course with combination vancomycin, Zosyn, and ceftazidime.  Supplemental oxygen has been titrated off and now oxygenating well on room air.  Right pleural effusion Underwent thoracentesis on 08/01/2020 with 4 to 50 mL of fluid removal.  Follow-up chest x-ray 6/2 with resolution of effusion.  Anemia of chronic disease Patient transfused 1 unit PRBC on 07/27/2020. --Hemoglobin stable  Hypernatremia: Resolved Etiology likely secondary to free water deficit, poor oral intake and dysphagia.   -- Continue tube feeds with free water flushes  Acute metabolic encephalopathy: Improving Etiology likely secondary to cardiac arrest with prolonged downtime, suspicious of anoxic brain injury.  CT head on admission with hypodensity deep white matter frontal lobes bilaterally likely secondary to chronic microvascular ischemia, no cerebral edema.  EEG negative for  epileptiform activity.  MRI brain consistent  with punctate left frontal CVA.  --Continue supportive care; restraints/mittens now off, sitter now discontinued  Severe oropharyngeal dysphagia Underwent modified barium swallow on 08/06/2020 --Speech therapy following --Continue nothing by mouth --Core track in place --Continue tube feeds --Will need PEG tube if family in agreement; waiting for patients son for family approval. --If approved for CIR, may be able to hold off on PEG tube  Tobacco use disorder --Nicotine patch  Suspected COPD No official diagnosis outpatient.  By family reports long standing history of significant tobacco use. --DuoNebs as needed shortness of breath/wheezing  Ethics: Patient continues to make very poor progress following his cardiac/V. fib arrest with associated new onset ischemic cardiomyopathy requiring DES and a course of vasopressors, and findings of acute versus subacute left frontal CVA.  Patient continues with severe dysphagia requiring core track tube for tube feeds.  Palliative care has been consulted and following during hospital course for assistance with goals of care and medical decision making.  Family meeting on 08/07/2020 with patient's son and daughter to discuss options moving forward.  Discussed options to include a more palliative/comfort approach or continued aggressive treatment which will require PEG tube.  They will discuss this with the remainder of their family.     DVT prophylaxis: enoxaparin (LOVENOX) injection 30 mg Start: 07/29/20 1000 SCDs Start: 07/25/20 6301    Code Status: Full Code Family Communication: Attempted to update patient's son via telephone, unsuccessful this morning.  At bedside this morning.  Disposition Plan:  Level of care: Telemetry Medical Status is: Inpatient  Remains inpatient appropriate because:Altered mental status, Ongoing diagnostic testing needed not appropriate for outpatient work up, Unsafe d/c plan,  IV treatments appropriate due to intensity of illness or inability to take PO and Inpatient level of care appropriate due to severity of illness   Dispo: The patient is from: Home              Anticipated d/c is to: SNF              Patient currently is not medically stable to d/c.   Difficult to place patient No   Consultants:  PCCM Cardiology  Procedures:  Cardiac catheterization 5/21, with placement DES to LAD Intubation 5/21 Targeted temperature management 5/21 Right IJ central line placement 5/21 Arterial line Extubated 5/31  Antimicrobials:  Vancomycin 5/31 - 6/6 Cefepime 5/31 - 6/6 Zosyn 5/21 - 5/28  Anti-infectives (From admission, onward)    Start     Dose/Rate Route Frequency Ordered Stop   08/03/20 2330  vancomycin (VANCOREADY) IVPB 750 mg/150 mL        750 mg 150 mL/hr over 60 Minutes Intravenous Every 12 hours 08/03/20 1206 08/06/20 2235   08/03/20 1000  cefTAZidime (FORTAZ) 2 g in sodium chloride 0.9 % 100 mL IVPB        2 g 200 mL/hr over 30 Minutes Intravenous Every 8 hours 08/03/20 0937 08/06/20 1453   07/31/20 1000  cefTAZidime (FORTAZ) 2 g in sodium chloride 0.9 % 100 mL IVPB  Status:  Discontinued        2 g 200 mL/hr over 30 Minutes Intravenous Every 12 hours 07/31/20 0808 08/03/20 0937   07/31/20 0930  vancomycin (VANCOREADY) IVPB 1000 mg/200 mL  Status:  Discontinued        1,000 mg 200 mL/hr over 60 Minutes Intravenous Every 24 hours 07/31/20 0808 08/03/20 1206   07/21/20 0630  piperacillin-tazobactam (ZOSYN) IVPB 3.375 g        3.375  g 12.5 mL/hr over 240 Minutes Intravenous Every 8 hours 07/21/20 0531 07/28/20 0100   07/21/20 0615  piperacillin-tazobactam (ZOSYN) IVPB 2.25 g  Status:  Discontinued        2.25 g 100 mL/hr over 30 Minutes Intravenous Every 8 hours 07/21/20 0519 07/21/20 0531         Subjective Patient seen examined bedside, resting comfortably.  Interview assisted with video interpreter, Idil [id number].  No family present.   No sitter and abdominal restraints/mittens now removed.  Tube feeds now resumed as core track has been fixed yesterday.  Patient with no specific complaints other than wishes to drink some water.  No acute concerns overnight per nursing staff.  If approved for CIR, may be able to wait PEG tube placement, otherwise would need PEG tube for SNF placement.  Objective: Vitals:   08/08/20 2345 08/09/20 0520 08/09/20 0756 08/09/20 0931  BP: 97/73 97/63 95/67  100/77  Pulse: 95 95 94 97  Resp:  18 18   Temp: 97.8 F (36.6 C) 97.8 F (36.6 C) 98.6 F (37 C)   TempSrc: Axillary Oral Oral   SpO2: 96% 96% 96%   Weight:  45.3 kg    Height:        Intake/Output Summary (Last 24 hours) at 08/09/2020 1059 Last data filed at 08/09/2020 0400 Gross per 24 hour  Intake 0 ml  Output 200 ml  Net -200 ml   Filed Weights   08/08/20 0500 08/08/20 0635 08/09/20 0520  Weight: 48.5 kg 46.6 kg 45.3 kg    Examination:  General exam: Appears calm and comfortable, confused, thin/cachectic, chronically ill in appearance Respiratory system: Clear to auscultation. Respiratory effort normal.  On room air Cardiovascular system: S1 & S2 heard, RRR. No JVD, murmurs, rubs, gallops or clicks. No pedal edema. Gastrointestinal system: Abdomen is nondistended, soft and nontender. No organomegaly or masses felt. Normal bowel sounds heard. Central nervous system: Alert, oriented to place ([place]), time (22) and his name, otherwise unable to state situation. No focal neurological deficits other than his continued severe dysphagia and dysphonia Extremities: Moves all extremities independently Skin: No rashes, lesions or ulcers Psychiatry: Judgement and insight appear poor.     Data Reviewed: I have personally reviewed following labs and imaging studies  CBC: Recent Labs  Lab 08/03/20 0400 08/04/20 0420 08/05/20 0304 08/06/20 2044 08/08/20 0457  WBC 15.3* 15.1* 14.1* 11.0* 12.2*  NEUTROABS  --   --   --  8.2*  --    HGB 9.4* 9.9* 10.0* 10.5* 11.7*  HCT 29.8* 31.2* 31.5* 33.4* 38.3*  MCV 94.9 94.5 94.9 94.6 97.5  PLT 448* 435* 406* 348 359   Basic Metabolic Panel: Recent Labs  Lab 08/05/20 0304 08/06/20 0422 08/07/20 0403 08/08/20 0457 08/09/20 0333  NA 145 143 140 139 141  K 3.8 3.8 4.2 4.5 4.2  CL 118* 117* 110 108 107  CO2 23 23 23  20* 24  GLUCOSE 218* 222* 125* 76 142*  BUN 26* 21 24* 23 30*  CREATININE 0.76 0.68 0.60* 0.75 0.84  CALCIUM 8.1* 8.0* 8.3* 8.2* 8.1*  MG  --   --   --  2.2  --    GFR: Estimated Creatinine Clearance: 55.4 mL/min (by C-G formula based on SCr of 0.84 mg/dL). Liver Function Tests: No results for input(s): AST, ALT, ALKPHOS, BILITOT, PROT, ALBUMIN in the last 168 hours.  No results for input(s): LIPASE, AMYLASE in the last 168 hours. No results for input(s): AMMONIA in the  last 168 hours. Coagulation Profile: Recent Labs  Lab 08/03/20 0400 08/04/20 0420 08/05/20 0304 08/06/20 0422  INR 1.2 1.2 1.1 1.1   Cardiac Enzymes: No results for input(s): CKTOTAL, CKMB, CKMBINDEX, TROPONINI in the last 168 hours. BNP (last 3 results) No results for input(s): PROBNP in the last 8760 hours. HbA1C: No results for input(s): HGBA1C in the last 72 hours. CBG: Recent Labs  Lab 08/08/20 1206 08/08/20 1935 08/09/20 0015 08/09/20 0704 08/09/20 0758  GLUCAP 99 157* 201* 224* 240*   Lipid Profile: No results for input(s): CHOL, HDL, LDLCALC, TRIG, CHOLHDL, LDLDIRECT in the last 72 hours. Thyroid Function Tests: No results for input(s): TSH, T4TOTAL, FREET4, T3FREE, THYROIDAB in the last 72 hours. Anemia Panel: No results for input(s): VITAMINB12, FOLATE, FERRITIN, TIBC, IRON, RETICCTPCT in the last 72 hours. Sepsis Labs: No results for input(s): PROCALCITON, LATICACIDVEN in the last 168 hours.  Recent Results (from the past 240 hour(s))  Culture, Respiratory w Gram Stain     Status: None   Collection Time: 07/31/20  8:00 AM   Specimen: Tracheal Aspirate;  Respiratory  Result Value Ref Range Status   Specimen Description TRACHEAL ASPIRATE  Final   Special Requests NONE  Final   Gram Stain   Final    FEW WBC PRESENT, PREDOMINANTLY MONONUCLEAR NO ORGANISMS SEEN    Culture   Final    NO GROWTH 2 DAYS Performed at [facility], [address], [place], [place] [address]    Report Status 08/04/2020 FINAL  Final  Body fluid culture w Gram Stain     Status: None   Collection Time: 08/01/20  2:40 PM   Specimen: Fluid  Result Value Ref Range Status   Specimen Description FLUID PLEURAL  Final   Special Requests NONE  Final   Gram Stain   Final    FEW WBC PRESENT,BOTH PMN AND MONONUCLEAR NO ORGANISMS SEEN    Culture   Final    NO GROWTH 3 DAYS Performed at [facility], [address] [place], [place] [address]    Report Status 08/05/2020 FINAL  Final         Radiology Studies: MR BRAIN WO CONTRAST  Result Date: 08/07/2020 CLINICAL DATA:  Persistent altered mental status post cardiac arrest EXAM: MRI HEAD WITHOUT CONTRAST TECHNIQUE: Multiplanar, multiecho pulse sequences of the brain and surrounding structures were obtained without intravenous contrast. COMPARISON:  None. FINDINGS: Brain: Punctate focus of reduced diffusion in the left frontal lobe. Prominence of the ventricles and sulci reflects minor generalized parenchymal volume loss. Few small foci of T2 hyperintensity in the supratentorial white matter are nonspecific but may reflect minor chronic microvascular ischemic changes. There is a chronic small vessel infarct of the posterior left putamen. There is no evidence of intracranial hemorrhage. There is no intracranial mass or mass effect. There is no hydrocephalus or extra-axial fluid collection. Vascular: Major vessel flow voids at the skull base are preserved. Skull and upper cervical spine: Normal marrow signal is preserved. Sinuses/Orbits: Mild mucosal thickening.  Right lens replacement. Other: Sella is unremarkable.   Patchy mastoid fluid opacification. 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 No evidence of hypoxic/ischemic injury. Electronically Signed   By: Guadlupe Spanish M.D.   On: 08/07/2020 18:57   DG Abd Portable 1V  Result Date: 08/08/2020 CLINICAL DATA:  Feeding tube adjustment. EXAM: PORTABLE ABDOMEN - 1 VIEW COMPARISON:  None. FINDINGS: Tip of the weighted enteric tube in the right upper quadrant in  the region of the distal stomach. No bowel dilatation to suggest obstruction. There is contrast in the colon from prior swallowing function. IMPRESSION: Tip of the weighted enteric tube in the right upper quadrant in the region of the distal stomach. Electronically Signed   By: Narda Rutherford M.D.   On: 08/08/2020 16:23         Scheduled Meds:  aspirin  81 mg Per Tube Daily   carvedilol  3.125 mg Per Tube BID WC   chlorhexidine  15 mL Mouth Rinse BID   clonazepam  0.5 mg Per Tube TID   digoxin  0.125 mg Per Tube Daily   enoxaparin (LOVENOX) injection  30 mg Subcutaneous Daily   famotidine  20 mg Per Tube BID   feeding supplement (PROSource TF)  45 mL Per Tube BID   free water  100 mL Per Tube Q4H   insulin aspart  0-15 Units Subcutaneous Q4H   insulin aspart  3 Units Subcutaneous Q4H   mouth rinse  15 mL Mouth Rinse q12n4p   nicotine  14 mg Transdermal Daily   oxyCODONE  5 mg Per Tube Q8H   rosuvastatin  40 mg Per Tube Daily   ticagrelor  90 mg Per Tube BID   Continuous Infusions:  sodium chloride Stopped (08/06/20 0521)   feeding supplement (OSMOLITE 1.5 CAL) 1,000 mL (08/06/20 2131)     LOS: 19 days    Time spent: 39 minutes spent on chart review, discussion with nursing staff, consultants, updating family and interview/physical exam; more than 50% of that time was spent in counseling and/or coordination of care.    Alvira Philips [place], DO [facility] Available via Epic secure chat 7am-7pm After these hours, please refer to coverage provider listed on  [email or web address] 08/09/2020, 10:59 AM

## 2020-08-09 NOTE — Plan of Care (Signed)
  Problem: Clinical Measurements: Goal: Respiratory complications will improve Outcome: Progressing   Problem: Clinical Measurements: Goal: Cardiovascular complication will be avoided Outcome: Progressing   Problem: Nutrition: Goal: Adequate nutrition will be maintained Outcome: Progressing   Problem: Elimination: Goal: Will not experience complications related to urinary retention Outcome: Progressing   Problem: Elimination: Goal: Will not experience complications related to bowel motility Outcome: Progressing   Problem: Pain Managment: Goal: General experience of comfort will improve Outcome: Progressing   Problem: Safety: Goal: Ability to remain free from injury will improve Outcome: Progressing   Problem: Skin Integrity: Goal: Risk for impaired skin integrity will decrease Outcome: Progressing

## 2020-08-09 NOTE — PMR Pre-admission (Signed)
PMR Admission Coordinator Pre-Admission Assessment  Patient: Vincent Horton is an 65 y.o., male MRN: [id number] DOB: 11/01/1955 Height: 5' 4"  (162.6 cm) Weight: 45.3 kg  Insurance Information HMO:     PPO:      PCP:      IPA:      80/20:      OTHER:  PRIMARY: Medicare A and B      Policy#: [id number]      Subscriber: pt CM Name:       Phone#:      Fax#:  Pre-Cert#: verified Civil engineer, contracting:  Benefits:  Phone #:      Name:  Eff. Date: 02/01/2019 A and B     Deduct: $1556      Out of Pocket Max: n/a      Life Max: n/a CIR: 100%      SNF: 20 full days Outpatient: 80%     Co-Ins: 20% Home Health: 100%      Co-Pay:  DME: 80%     Co Ins: 20%  Providers: pt choice SECONDARY: Medicaid ([employer])     Policy#: [id number]     Phone#:   Financial Counselor:       Phone#:   The "Data Collection Information Summary" for patients in Inpatient Rehabilitation Facilities with attached "Privacy Act [place] Records" was provided and verbally reviewed with: Patient and Family  Emergency Contact Information Contact Information     Name Relation Home Work [place] Son [phone]  [phone]   [facility] Daughter   [phone]   Su Hoff   [phone]       Current Medical History  Patient Admitting Diagnosis: cardiac arrest, L frontal CVA  History of Present Illness: [name] is a 65 year old right-handed Somali speaking (limited [place]) male with unknown medical history on no prescription medications.  Presented 07/21/2020 after being found unresponsive by family.  EMS arrival patient pulseless and apneic.  Initially in V. fib total of 5 shocks, 3 epis, 450 mg amiodarone 15 minutes of CPR.  Patient agitated and hypotensive upon arrival to the ED received Versed.  Admission chemistry glucose 275, creatinine 1.40, AST 328, ALT 251, hemoglobin A1c 6.5, troponin 2747, WBC 13,200, lactic acid 3.7.  EKG showing ST segment changes in anterior lateral leads and  underwent emergent cardiac catheterization requiring LAD stenting.  Echocardiogram ejection fraction of 30 to 35% left ventricle moderately decreased function.  Cranial CT scan negative for cerebral edema.  No evidence of anoxic brain injury.  Hypodensity in the deep white matter in the frontal lobes bilaterally likely due to chronic microvascular ischemia.  He did require vasopressors for hypotension.  Hospital course respiratory failure intubation treated for right side aspiration pneumonia as well as patient developed right pleural effusion undergoing thoracentesis 08/01/2020 with 450 cc fluid removal.  Follow-up chest x-ray 08/02/2020 without effusion.  EEG negative for seizure but did suggest severe diffuse encephalopathy.  Patient remains on low-dose aspirin therapy per cardiology services as well as Brilinta.  Subcutaneous Lovenox for DVT prophylaxis.  Currently n.p.o. with alternative means of nutritional support.  MRI of the brain completed 08/07/2020 due to persistent altered mental status, showing changes post cardiac arrest and also showing punctate acute to subacute left frontal infarct.  No evidence of hypoxic ischemic injury.  Patient remained on low-dose aspirin/Brilinta therapy for both CVA prophylaxis as well as cardiac.  Palliative care consulted to establish goals of care.  Therapy evaluations completed due to  patient's altered mental status cardiac arrest was recommended for a comprehensive rehab program    [facility]'s medical record from [facility] has been reviewed by the rehabilitation admission coordinator and physician.  Past Medical History  Past Medical History:  Diagnosis Date   Cardiac arrest [facility])    Coronary artery disease    Dysrhythmia    Myocardial infarction [facility])     Family History   family history is not on file.  Prior Rehab/Hospitalizations Has the patient had prior rehab or hospitalizations prior to admission? No  Has the patient had major surgery during 100 days  prior to admission? No   Current Medications  Current Facility-Administered Medications:    0.9 %  sodium chloride infusion, 250 mL, Intravenous, Continuous, Anders Simmonds, MD, Stopped at 08/06/20 [phone]   acetaminophen (TYLENOL) 160 MG/5ML solution 650 mg, 650 mg, Per Tube, Q6H PRN, Icard, Bradley L, DO, 650 mg at 08/01/20 1008   aspirin chewable tablet 81 mg, 81 mg, Per Tube, Daily, [place], Peter M, MD, 81 mg at 08/10/20 0954   carvedilol (COREG) tablet 3.125 mg, 3.125 mg, Per Tube, BID WC, Larey Dresser, MD, 3.125 mg at 08/10/20 0955   chlorhexidine (PERIDEX) 0.12 % solution 15 mL, 15 mL, Mouth Rinse, BID, Candee Furbish, MD, 15 mL at 08/10/20 9242   clonazePAM (KLONOPIN) disintegrating tablet 0.5 mg, 0.5 mg, Per Tube, TID, Jennelle Human B, NP, 0.5 mg at 08/10/20 0954   digoxin (LANOXIN) tablet 0.125 mg, 0.125 mg, Per Tube, Daily, Candee Furbish, MD, 0.125 mg at 08/10/20 0954   enoxaparin (LOVENOX) injection 30 mg, 30 mg, Subcutaneous, Daily, Lyndee Leo, RPH, 30 mg at 08/10/20 0953   famotidine (PEPCID) tablet 20 mg, 20 mg, Per Tube, BID, Einar Grad, RPH, 20 mg at 08/10/20 0954   feeding supplement (OSMOLITE 1.5 CAL) liquid 1,000 mL, 1,000 mL, Per Tube, Continuous, Candee Furbish, MD, Last Rate: 45 mL/hr at 08/10/20 0600, Infusion Verify at 08/10/20 0600   feeding supplement (PROSource TF) liquid 45 mL, 45 mL, Per Tube, BID, Candee Furbish, MD, 45 mL at 08/09/20 2109   free water 100 mL, 100 mL, Per Tube, Q4H, Estill Cotta, NP, 100 mL at 08/10/20 0956   guaiFENesin-dextromethorphan (ROBITUSSIN DM) 100-10 MG/5ML syrup 10 mL, 10 mL, Per Tube, Q4H PRN, Anders Simmonds, MD, 10 mL at 08/05/20 2122   haloperidol lactate (HALDOL) injection 5 mg, 5 mg, Intravenous, Q6H PRN, Candee Furbish, MD, 5 mg at 08/06/20 0356   insulin aspart (novoLOG) injection 0-15 Units, 0-15 Units, Subcutaneous, Q4H, Mannam, Praveen, MD, 2 Units at 08/10/20 0957   insulin aspart (novoLOG)  injection 3 Units, 3 Units, Subcutaneous, Q4H, Candee Furbish, MD, 3 Units at 08/10/20 0958   ipratropium-albuterol (DUONEB) 0.5-2.5 (3) MG/3ML nebulizer solution 3 mL, 3 mL, Nebulization, Q4H PRN, Hosie Poisson, MD   loperamide HCl (IMODIUM) 1 MG/7.5ML suspension 1 mg, 1 mg, Per Tube, PRN, Hosie Poisson, MD, 1 mg at 08/06/20 2131   MEDLINE mouth rinse, 15 mL, Mouth Rinse, q12n4p, Candee Furbish, MD, 15 mL at 08/10/20 0442   nicotine (NICODERM CQ - dosed in mg/24 hours) patch 14 mg, 14 mg, Transdermal, Daily, Candee Furbish, MD, 14 mg at 08/10/20 0959   ondansetron (ZOFRAN) injection 4 mg, 4 mg, Intravenous, Q6H PRN, [place], Peter M, MD, 4 mg at 07/31/20 1541   oxyCODONE (Oxy IR/ROXICODONE) immediate release tablet 5 mg, 5 mg, Per Tube, Q8H, Candee Furbish,  MD, 5 mg at 08/10/20 0448   rosuvastatin (CRESTOR) tablet 40 mg, 40 mg, Per Tube, Daily, [place], Peter M, MD, 40 mg at 08/10/20 6629   ticagrelor (BRILINTA) tablet 90 mg, 90 mg, Per Tube, BID, [place] Eric J, DO, 90 mg at 08/10/20 4765  Patients Current Diet:  Diet Order             Diet NPO time specified  Diet effective now                   Precautions / Restrictions Precautions Precautions: Fall, Other (comment) Precaution Comments: cortrak Restrictions Weight Bearing Restrictions: No   Has the patient had 2 or more falls or a fall with injury in the past year? No  Prior Activity Level Limited Community (1-2x/wk): doesn't work, but does drive, no DME used at baseline  Prior Functional Level Self Care: Did the patient need help bathing, dressing, using the toilet or eating? Independent  Indoor Mobility: Did the patient need assistance with walking from room to room (with or without device)? Independent  Stairs: Did the patient need assistance with internal or external stairs (with or without device)? Independent  Functional Cognition: Did the patient need help planning regular tasks such as shopping or remembering  to take medications? Independent  Home Assistive Devices / Equipment Home Assistive Devices/Equipment: None Home Equipment: None  Prior Device Use: Indicate devices/aids used by the patient prior to current illness, exacerbation or injury? None of the above  Current Functional Level Cognition  Arousal/Alertness: Awake/alert Overall Cognitive Status: Impaired/Different from baseline Difficult to assess due to: Impaired communication Current Attention Level: Sustained, Selective Orientation Level: Oriented X4 Following Commands: Follows one step commands with increased time Safety/Judgement: Decreased awareness of safety, Decreased awareness of deficits General Comments: Minimal audible verbal communication beyond head nods and quietlyspeaking a few English words; following majority of simple commands in [place]. Still distracted by lines, but more easily redirected. Decreased safety awareness (attempting to turn and sit on RW bar) requiring frequent verbal cues for safety and sequencing. Still with increased flat affect; no family present this session Attention: Sustained Sustained Attention: Impaired Sustained Attention Impairment: Verbal basic Memory: Impaired Memory Impairment: Decreased recall of new information Awareness: Impaired Awareness Impairment: Intellectual impairment, Emergent impairment Behaviors: Impulsive Safety/Judgment: Impaired    Extremity Assessment (includes Sensation/Coordination)  Upper Extremity Assessment: Generalized weakness  Lower Extremity Assessment: Generalized weakness    ADLs  Overall ADL's : Needs assistance/impaired Eating/Feeding: NPO Eating/Feeding Details (indicate cue type and reason): Pt on feeding tube Grooming: Moderate assistance, Sitting Grooming Details (indicate cue type and reason): able to bring hand to face, fatigues quickly Upper Body Bathing: Moderate assistance Lower Body Bathing: Maximal assistance Upper Body Dressing :  Moderate assistance Lower Body Dressing: Maximal assistance Toilet Transfer: Moderate assistance Toilet Transfer Details (indicate cue type and reason): Pt simulated stand pivot transfer to recliner, requiring mod assist to stand and take steps to recliner. Toileting- Clothing Manipulation and Hygiene: Maximal assistance Functional mobility during ADLs: Moderate assistance General ADL Comments: Pt requiring mod verbal and tactile cues for hand placement with RW as well as with RW placement during transfer.    Mobility  Overal bed mobility: Needs Assistance Bed Mobility: Supine to Sit Supine to sit: Supervision, HOB elevated, Min assist Sit to supine: Total assist General bed mobility comments: Initial supervision to come to sitting EOB; pt then reaching back for phone charger with posterior LOB, requiring up to minA for repeated partial sidelying<>sit suspect  related to increased fatigue    Transfers  Overall transfer level: Needs assistance Equipment used: None, Rolling walker (2 wheeled) Transfers: Sit to/from Stand Sit to Stand: Mod assist Stand pivot transfers: Mod assist General transfer comment: Multiple sit<>stands from EOB, recliner and chair without armrests, no DME, pt requiring consistent modA for stability upon standing, reliant on UE support; additional trial with RW, minA for stability and poor safety awareness with RW management    Ambulation / Gait / Stairs / Wheelchair Mobility  Ambulation/Gait Ambulation/Gait assistance: Mod assist, Max assist, Min assist Gait Distance (Feet): 14 Feet (+28) Assistive device: None, Rolling walker (2 wheeled) Gait Pattern/deviations: Step-through pattern, Decreased stride length, Shuffle, Trunk flexed, Narrow base of support, Staggering right, Staggering left General Gait Details: Slow, unsteady gait without DME, pt insistent on use of RW and reaching far outside of BOS to get despite verbal cues to try without, mod-maxA for stability;  additional gait training with RW, pt with poor safety awareness and RW management, requiring consistent minA for balance, up to mod-maxA for stability and RW management; at one point attempting to turn and sit on RW bar; 1x seated rest break for fatigue and safety Gait velocity: Decreased    Posture / Balance Dynamic Sitting Balance Sitting balance - Comments: Can static sit edge of recliner without UE support, likely unable to accept significant challenge or reach outside BOS Balance Overall balance assessment: Needs assistance Sitting-balance support: Bilateral upper extremity supported, Feet supported Sitting balance-Leahy Scale: Fair Sitting balance - Comments: Can static sit edge of recliner without UE support, likely unable to accept significant challenge or reach outside BOS Postural control: Posterior lean Standing balance support: Bilateral upper extremity supported Standing balance-Leahy Scale: Poor Standing balance comment: Reliant on external assist to maintain static standing balance; performed ADL tasks at sink with either single UE or trunk support against counter, external assist as well to maintain balance    Special needs/care consideration Diabetic management yes   Previous Home Environment (from acute therapy documentation) Living Arrangements: Children (3 daughters)  Lives With: Family Available Help at Discharge: Family, Available 24 hours/day Type of Home: House Home Layout: One level Home Access:  (unsure) Entrance Stairs-Number of Steps: 3 Bathroom Shower/Tub: Chiropodist: Standard Home Care Services: No Additional Comments: PT history obtained from conversation with OT (based on phone call with pt's family), pt with difficulty verbalizing during session.  Discharge Living Setting Plans for Discharge Living Setting: Lives with (comment) (daughters) Type of Home at Discharge: House Discharge Home Layout: One level Discharge Home Access:  Stairs to enter Entrance Stairs-Rails: None Entrance Stairs-Number of Steps: 3 Discharge Bathroom Shower/Tub: Tub/shower unit Discharge Bathroom Toilet: Standard Discharge Bathroom Accessibility: Yes How Accessible: Accessible via walker Does the patient have any problems obtaining your medications?: No  Social/Family/Support Systems Anticipated Caregiver: Sadia (dtr) and Sheryle Hail (son) are main contacts, but has 2 other daughters who can help Anticipated Caregiver's Contact Information: Loralyn Freshwater [phone] Ability/Limitations of Caregiver: n/a Caregiver Availability: 24/7 Discharge Plan Discussed with Primary Caregiver: Yes Is Caregiver In Agreement with Plan?: Yes Does Caregiver/Family have Issues with Lodging/Transportation while Pt is in Rehab?: No  Goals Patient/Family Goal for Rehab: PT/OT supervision to mod I, SLP supervision Expected length of stay: 16-20 days Pt/Family Agrees to Admission and willing to participate: Yes Program Orientation Provided & Reviewed with Pt/Caregiver Including Roles  & Responsibilities: Yes  Decrease burden of Care through IP rehab admission: n/a  Possible need for SNF placement upon  discharge: Not anticipated.  Pt with good family support and excellent rehab potential.   Patient Condition: I have reviewed medical records from [facility], spoken with CM, and patient, son, and daughter. I met with patient at the bedside for inpatient rehabilitation assessment.  Patient will benefit from ongoing PT, OT, and SLP, can actively participate in 3 hours of therapy a day 5 days of the week, and can make measurable gains during the admission.  Patient will also benefit from the coordinated team approach during an Inpatient Acute Rehabilitation admission.  The patient will receive intensive therapy as well as Rehabilitation physician, nursing, social worker, and care management interventions.  Due to bladder management, bowel management,  safety, skin/wound care, disease management, medication administration, pain management, and patient education the patient requires 24 hour a day rehabilitation nursing.  The patient is currently mod/max with mobility and basic ADLs.  Discharge setting and therapy post discharge at home with home health is anticipated.  Patient has agreed to participate in the Acute Inpatient Rehabilitation Program and will admit today.  Preadmission Screen Completed By:  Michel Santee, PT, DPT 08/10/2020 10:34 AM ______________________________________________________________________   Discussed status with Dr. Naaman Plummer on 08/10/20  at 10:34 AM  and received approval for admission today.  Admission Coordinator:  Michel Santee, PT, DPT time 10:34 AM Sudie Grumbling 08/10/20    Assessment/Plan: Diagnosis: left frontal infarct Does the need for close, 24 hr/day Medical supervision in concert with the patient's rehab needs make it unreasonable for this patient to be served in a less intensive setting? Yes Co-Morbidities requiring supervision/potential complications: cad, mi Due to bladder management, bowel management, safety, skin/wound care, disease management, medication administration, pain management, and patient education, does the patient require 24 hr/day rehab nursing? Yes Does the patient require coordinated care of a physician, rehab nurse, PT, OT, and SLP to address physical and functional deficits in the context of the above medical diagnosis(es)? Yes Addressing deficits in the following areas: balance, endurance, locomotion, strength, transferring, bowel/bladder control, bathing, dressing, feeding, grooming, toileting, cognition, speech, and psychosocial support Can the patient actively participate in an intensive therapy program of at least 3 hrs of therapy 5 days a week? Yes The potential for patient to make measurable gains while on inpatient rehab is excellent Anticipated functional outcomes upon discharge  from inpatient rehab: modified independent and supervision PT, modified independent and supervision OT, supervision SLP Estimated rehab length of stay to reach the above functional goals is: 16-20 days Anticipated discharge destination: Home 10. Overall Rehab/Functional Prognosis: excellent   MD Signature: Meredith Staggers, MD, [facility] 08/10/2020

## 2020-08-09 NOTE — Consult Note (Signed)
NEUROLOGY CONSULTATION NOTE   Date of service: August 09, 2020 Patient Name: Vincent Horton MRN:  [id number] DOB:  05-Oct-1955 Reason for consult: prognostication after cardiac arrest, punctate subacute infarct on MRI brain _ _ _   _ __   _ __ _ _  __ __   _ __   __ _  History of Present Illness   Patient unable to provide hx. Per chart review, "65 year old male with limited medical history found down by family, EMS was called, he was found to be in V. fib shocked 5 x 3 mg epinephrine 450 mg of amiodarone with ROSC obtained.  He was emergently taken to the Cath Lab showing ST segment changes in anterior lateral leads status post revascularization of LAD.  He was intubated on 07/21/2020 and transferred to the ICU. He was extubated successfully  on 07/31/2020.  Pt is alert and interactive today, oriented x2 (stated year was 2024). He is diffusely weak but nonfocal, and is able to stand and pivot with PT. He has continued dysphagia, cortrak in place, PEG under consideration. Son Penn at bedside.  Neurology consulted for prognostication after cardiac arrest and for recommendations regarding subacute punctate frontal infarct on MRI brain 6/7.  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 No radiographic e/o hypoxic ischemic injury.  CNS imaging personally reviewed.  NSR, no e/o a fib/flutter on continuous telemetry  Hemoglobin A1c 6.5.  LDL 108.   TTE 07/21/20 1. Coarse apical trabeculation and calcified LV apical false tendon  (normal variant) -no mural thrombus. Left ventricular ejection fraction,  by estimation, is 30 to 35%. The left ventricle has moderately decreased  function. The left ventricle  demonstrates regional wall motion abnormalities (see scoring  diagram/findings for description). Left ventricular diastolic parameters  are consistent with Grade I diastolic dysfunction (impaired relaxation).  There is severe hypokinesis of the left   ventricular, entire anteroseptal wall, anterior wall, apical segment and  inferoapical segment.   2. Right ventricular systolic function is normal. The right ventricular  size is normal. There is normal pulmonary artery systolic pressure. The  estimated right ventricular systolic pressure is 29.6 mmHg.   3. The pericardial effusion is posterior to the left ventricle.   4. The mitral valve is abnormal. Trivial mitral valve regurgitation.   5. The aortic valve is tricuspid. Aortic valve regurgitation is not  visualized.   6. The inferior vena cava is normal in size with greater than 50%  respiratory variability, suggesting right atrial pressure of 3 mmHg.   EEG 07/21/20: moderate to severe diffuse slowing, no epiltiform abnl     ROS   Per HPI  Past History   Past Medical History:  Diagnosis Date   Cardiac arrest [facility])    Coronary artery disease    Dysrhythmia    Myocardial infarction [facility])    Past Surgical History:  Procedure Laterality Date   CARDIAC CATHETERIZATION     CORONARY/GRAFT ACUTE MI REVASCULARIZATION N/A 07/21/2020   Procedure: Coronary/Graft Acute MI Revascularization;  Surgeon: Swaziland, Peter M, MD;  Location: [facility] INVASIVE CV LAB;  Service: Cardiovascular;  Laterality: N/A;   LEFT HEART CATH AND CORONARY ANGIOGRAPHY N/A 07/21/2020   Procedure: LEFT HEART CATH AND CORONARY ANGIOGRAPHY;  Surgeon: Swaziland, Peter M, MD;  Location: [facility] INVASIVE CV LAB;  Service: Cardiovascular;  Laterality: N/A;   RIGHT HEART CATH N/A 07/21/2020   Procedure: RIGHT HEART CATH;  Surgeon: Swaziland, Peter M, MD;  Location: [facility]  INVASIVE CV LAB;  Service: Cardiovascular;  Laterality: N/A;   History reviewed. No pertinent family history. Social History   Socioeconomic History   Marital status: Legally Separated    Spouse name: Not on file   Number of children: Not on file   Years of education: Not on file   Highest education level: Not on file  Occupational History   Not on file  Tobacco Use    Smoking status: Every Day    Pack years: 0.00   Smokeless tobacco: Never  Vaping Use   Vaping Use: Unknown  Substance and Sexual Activity   Alcohol use: Not Currently   Drug use: Not Currently   Sexual activity: Not on file  Other Topics Concern   Not on file  Social History Narrative   Not on file   Social Determinants of Health   Financial Resource Strain: Not on file  Food Insecurity: Not on file  Transportation Needs: Not on file  Physical Activity: Not on file  Stress: Not on file  Social Connections: Not on file   No Known Allergies  Medications   No medications prior to admission.     Vitals   Vitals:   08/09/20 0756 08/09/20 0931 08/09/20 1630 08/09/20 1748  BP: 95/67 100/77 95/69 93/68   Pulse: 94 97  88  Resp: 18  17   Temp: 98.6 F (37 C)  97.9 F (36.6 C)   TempSrc: Oral  Oral   SpO2: 96%     Weight:      Height:         Body mass index is 17.14 kg/m.  Physical Exam   Physical Exam Gen: A&O x2, NAD Resp: CTAB CV: RRR  Examination performed directly with patient, assistance with interpretation performed prn by son [name] at bedside  Neuro: *MS: A&O x2. States year is 2024. Follows all simple commands *Speech: fluid, nondysarthric, able to name and repeat *CN:    I: Deferred   II,III: PERRL, VFF by confrontation   III,IV,VI: EOMI w/o nystagmus, no ptosis   V: Sensation intact from V1 to V3 to LT   VII: Eyelid closure was full.  Mild R NLF flattening   VIII: Hearing intact to voice   IX,X: Voice normal, palate elevates symmetrically    XI: SCM/trap 5/5 bilat   XII: Tongue protrudes midline, no atrophy or fasciculations  *Motor:   Normal bulk.  No tremor, rigidity or bradykinesia. BUE 4+/5 diffusely without drift. BLE 4/5 diffusely drift bilat but not to bed. *Sensory: Intact to light touch, pinprick, temperature vibration throughout. Symmetric. Propioception intact bilat.  No double-simultaneous extinction.  *Coordination:  FNF  intact bilat *Reflexes:  2+ and symmetric throughout without clonus; toes down-going bilat *Gait: witnessed stand and pivot from chair to bed with PT  NIHSS  1a Level of Conscious.: 0 1b LOC Questions: 1 1c LOC Commands: 0 2 Best Gaze: 0 3 Visual: 0 4 Facial Palsy: 1 5a Motor Arm - left: 0 5b Motor Arm - Right: 0 6a Motor Leg - Left: 1 6b Motor Leg - Right: 1 7 Limb Ataxia: 0 8 Sensory: 0 9 Best Language: 0 10 Dysarthria: 0 11 Extinct. and Inatten.: 0  TOTAL: 4 (all suspected 2/2 residual encephalopathy 2/2 recent cardiac arrest + deconditioning; only R NLF flattening suspected to be related to subacute L frontal infarct)   Premorbid mRS = 4   Labs   CBC:  Recent Labs  Lab 08/06/20 2044 08/08/20 0457  WBC 11.0*  12.2*  NEUTROABS 8.2*  --   HGB 10.5* 11.7*  HCT 33.4* 38.3*  MCV 94.6 97.5  PLT 348 359    Basic Metabolic Panel:  Lab Results  Component Value Date   NA 141 08/09/2020   K 4.2 08/09/2020   CO2 24 08/09/2020   GLUCOSE 142 (H) 08/09/2020   BUN 30 (H) 08/09/2020   CREATININE 0.84 08/09/2020   CALCIUM 8.1 (L) 08/09/2020   GFRNONAA >60 08/09/2020   Lipid Panel:  Lab Results  Component Value Date   LDLCALC 108 (H) 07/21/2020   HgbA1c:  Lab Results  Component Value Date   HGBA1C 6.5 (H) 07/21/2020   Urine Drug Screen: No results found for: LABOPIA, COCAINSCRNUR, LABBENZ, AMPHETMU, THCU, LABBARB  Alcohol Level No results found for: ETH   Impression   This is a 65 yo gentleman with limited pmhx found down by family 07/21/20 and found to be in v fib arrest 2/2 STEMI s/p LAD revascularization. His neurologic examination is remarkably intact given recent cardiac arrest. He has mild confusion with dates, and generally seems a bit slow to respond per family compared to baseline, which may still improve over time. In contrast to most patients post cardiac arrest he is expected to have definite capacity for a meaningful recovery (and in fact has already  made excellent progress against that goal). Certainly a PEG would be appropriate if his dyspahgia persists. Regarding his subacute L frontal infarct it is v small and not expected to meaningfully impact his recovery, however it warrants repeat TTE as well as carotid imaging to r/o intracardiac thrombus or carotid stenosis risk factors for recurrent stroke. Findings anad recommendations were discussed extensively with patient and 2 sons at bedside; questions were answered to their satisfaction.  Recommendations   - Vascular carotid US ordered - results pending - Repeat TTE r/o intracardiac thrombus ordered - results pending - Continue ASA, brilinta - Continue crestor (LDL 108) - Pending results of studies above consider additional amb cardiac monitoring after discharge. Based on the extremely small size of the infarct would not recommend an ILR at this time. - Continue PT/OT/SLP - I will arrange for outpatient neurology f/u  Will f/u on results above and relay any additional recommendation. ______________________________________________________________________   Thank you for the opportunity to take part in the care of this patient. If you have any further questions, please contact the neurology consultation attending.  Signed,  Bing Neighbors, MD [facility] [phone]  If 7pm- 7am, please page neurology on call as listed in AMION.

## 2020-08-09 NOTE — H&P (Signed)
Physical Medicine and Rehabilitation Admission H&P    Chief Complaint  Patient presents with   Cardiac Arrest  : HPI: Vincent Horton is a 65 year old right-handed non-English-speaking [name] male with unknown medical history on no prescription medications.  Per chart review patient lives with her 3 daughters.  Independent prior to admission.  1 level home 3 steps to entry.  Presented 07/21/2020 after being found unresponsive by family.  EMS arrival patient pulseless and apneic.  Initially in V. fib total of 5 shocks, 3 epis, 450 mg amiodarone 15 minutes of CPR.  Patient agitated and hypotensive upon arrival to the ED received Versed.  Admission chemistry glucose 275, creatinine 1.40, AST 328, ALT 251, hemoglobin A1c 6.5, troponin 2747, WBC 13,200, lactic acid 3.7.  EKG showing ST segment changes in anterior lateral leads and underwent emergent cardiac catheterization requiring LAD stenting.  Echocardiogram ejection fraction of 30 to 35% left ventricle moderately decreased function.  Cranial CT scan negative for cerebral edema.  No evidence of anoxic brain injury.  Hypodensity in the deep white matter in the frontal lobes bilaterally likely due to chronic microvascular ischemia.  She did require vasopressors for hypotension.  Hospital course respiratory failure intubation treated for right side aspiration pneumonia as well as patient developed right pleural effusion undergoing thoracentesis 08/01/2020 with 450 cc fluid removal.  Follow-up chest x-ray 08/02/2020 without effusion...  EEG negative for seizure but did suggest severe diffuse encephalopathy.  Patient remains on low-dose aspirin therapy per cardiology services as well as Brilinta.  Subcutaneous Lovenox for DVT prophylaxis.  Currently n.p.o. with alternative means of nutritional support.  MRI of the brain completed 08/07/2020 due to persistent altered mental status changes post cardiac arrest showing punctate acute to subacute left frontal infarct.  No  evidence of hypoxic ischemic injury.  Patient remained on low-dose aspirin/Brilinta therapy for both CVA prophylaxis as well as cardiac.  Palliative care consulted to establish goals of care.  Therapy evaluations completed due to patient's altered mental status cardiac arrest was admitted for a comprehensive rehab program.  Review of Systems  Unable to perform ROS: Acuity of condition  Past Medical History:  Diagnosis Date   Cardiac arrest [facility])    Coronary artery disease    Dysrhythmia    Myocardial infarction [facility])    Past Surgical History:  Procedure Laterality Date   CARDIAC CATHETERIZATION     CORONARY/GRAFT ACUTE MI REVASCULARIZATION N/A 07/21/2020   Procedure: Coronary/Graft Acute MI Revascularization;  Surgeon: Swaziland, Peter M, MD;  Location: [facility] INVASIVE CV LAB;  Service: Cardiovascular;  Laterality: N/A;   LEFT HEART CATH AND CORONARY ANGIOGRAPHY N/A 07/21/2020   Procedure: LEFT HEART CATH AND CORONARY ANGIOGRAPHY;  Surgeon: Swaziland, Peter M, MD;  Location: [facility] INVASIVE CV LAB;  Service: Cardiovascular;  Laterality: N/A;   RIGHT HEART CATH N/A 07/21/2020   Procedure: RIGHT HEART CATH;  Surgeon: Swaziland, Peter M, MD;  Location: [facility] INVASIVE CV LAB;  Service: Cardiovascular;  Laterality: N/A;   History reviewed. No pertinent family history. Social History:  reports that he has been smoking. He has never used smokeless tobacco. He reports previous alcohol use. He reports previous drug use. Allergies: No Known Allergies No medications prior to admission.    Drug Regimen Review Drug regimen was reviewed and remains appropriate with no significant issues identified  Home: Home Living Family/patient expects to be discharged to:: Private residence Living Arrangements: Children (3 daughters) Available Help at Discharge: Family, Available 24 hours/day Type of Home: House Home  Access:  (unsure) Entrance Stairs-Number of Steps: 3 Home Layout: One level Bathroom Shower/Tub: Teacher, music: Standard Home Equipment: None Additional Comments: PT history obtained from conversation with OT (based on phone call with pt's family), pt with difficulty verbalizing during session.  Lives With: Family   Functional History: Prior Function Level of Independence: Independent Comments: does not work, but drives, walks without DME and enjoys community mobility, also enjoys fixing things like watches  Functional Status:  Mobility: Bed Mobility Overal bed mobility: Needs Assistance Bed Mobility: Supine to Sit Supine to sit: Supervision, HOB elevated, Min assist Sit to supine: Total assist General bed mobility comments: Initial supervision to come to sitting EOB; pt then reaching back for phone charger with posterior LOB, requiring up to minA for repeated partial sidelying<>sit suspect related to increased fatigue Transfers Overall transfer level: Needs assistance Equipment used: None, Rolling walker (2 wheeled) Transfers: Sit to/from Stand Sit to Stand: Mod assist Stand pivot transfers: Mod assist General transfer comment: Multiple sit<>stands from EOB, recliner and chair without armrests, no DME, pt requiring consistent modA for stability upon standing, reliant on UE support; additional trial with RW, minA for stability and poor safety awareness with RW management Ambulation/Gait Ambulation/Gait assistance: Mod assist, Max assist, Min assist Gait Distance (Feet): 14 Feet (+28) Assistive device: None, Rolling walker (2 wheeled) Gait Pattern/deviations: Step-through pattern, Decreased stride length, Shuffle, Trunk flexed, Narrow base of support, Staggering right, Staggering left General Gait Details: Slow, unsteady gait without DME, pt insistent on use of RW and reaching far outside of BOS to get despite verbal cues to try without, mod-maxA for stability; additional gait training with RW, pt with poor safety awareness and RW management, requiring consistent minA for  balance, up to mod-maxA for stability and RW management; at one point attempting to turn and sit on RW bar; 1x seated rest break for fatigue and safety Gait velocity: Decreased    ADL: ADL Overall ADL's : Needs assistance/impaired Eating/Feeding: NPO Eating/Feeding Details (indicate cue type and reason): Pt on feeding tube Grooming: Moderate assistance, Sitting Grooming Details (indicate cue type and reason): able to bring hand to face, fatigues quickly Upper Body Bathing: Moderate assistance Lower Body Bathing: Maximal assistance Upper Body Dressing : Moderate assistance Lower Body Dressing: Maximal assistance Toilet Transfer: Moderate assistance Toilet Transfer Details (indicate cue type and reason): Pt simulated stand pivot transfer to recliner, requiring mod assist to stand and take steps to recliner. Toileting- Clothing Manipulation and Hygiene: Maximal assistance Functional mobility during ADLs: Moderate assistance General ADL Comments: Pt requiring mod verbal and tactile cues for hand placement with RW as well as with RW placement during transfer.  Cognition: Cognition Overall Cognitive Status: Impaired/Different from baseline Arousal/Alertness: Awake/alert Orientation Level: Oriented to person, Oriented to place Attention: Sustained Sustained Attention: Impaired Sustained Attention Impairment: Verbal basic Memory: Impaired Memory Impairment: Decreased recall of new information Awareness: Impaired Awareness Impairment: Intellectual impairment, Emergent impairment Behaviors: Impulsive Safety/Judgment: Impaired Cognition Arousal/Alertness: Awake/alert Behavior During Therapy: Flat affect Overall Cognitive Status: Impaired/Different from baseline Area of Impairment: Attention, Following commands, Safety/judgement, Awareness, Problem solving Current Attention Level: Sustained, Selective Memory: Decreased short-term memory Following Commands: Follows one step commands with  increased time Safety/Judgement: Decreased awareness of safety, Decreased awareness of deficits Awareness: Intellectual, Emergent Problem Solving: Difficulty sequencing, Requires verbal cues, Requires tactile cues General Comments: Minimal audible verbal communication beyond head nods and quietlyspeaking a few English words; following majority of simple commands in [place]. Still distracted by lines, but more  easily redirected. Decreased safety awareness (attempting to turn and sit on RW bar) requiring frequent verbal cues for safety and sequencing. Still with increased flat affect; no family present this session Difficult to assess due to: Impaired communication  Physical Exam: Blood pressure 96/68, pulse 89, temperature 98.3 F (36.8 C), temperature source Axillary, resp. rate 19, height 5\' 4"  (1.626 m), weight 45.3 kg, SpO2 97 %. Physical Exam Constitutional:      General: He is not in acute distress.    Appearance: He is ill-appearing.  HENT:     Head: Normocephalic.     Right Ear: External ear normal.     Left Ear: External ear normal.     Nose:     Comments: NGT    Mouth/Throat:     Mouth: Mucous membranes are moist.  Eyes:     Extraocular Movements: Extraocular movements intact.     Pupils: Pupils are equal, round, and reactive to light.  Cardiovascular:     Rate and Rhythm: Normal rate and regular rhythm.     Heart sounds: No murmur heard.   No gallop.  Pulmonary:     Effort: Pulmonary effort is normal. No respiratory distress.     Breath sounds: No wheezing.  Abdominal:     General: Abdomen is flat. Bowel sounds are normal. There is no distension.  Musculoskeletal:        General: No swelling. Normal range of motion.     Cervical back: Normal range of motion.  Skin:    General: Skin is warm.  Neurological:     Mental Status: He is alert.     Comments: Patient is alert.  With assistance of video interpreter he does follow simple commands.  Provides year in person but  decreased awareness and insight as a whole  Patient is dysphonic. Moves all 4 limbs. Senses pain.   Psychiatric:     Comments: Flat but cooperates    Results for orders placed or performed during the hospital encounter of 07/21/20 (from the past 48 hour(s))  Glucose, capillary     Status: None   Collection Time: 08/08/20  8:08 AM  Result Value Ref Range   Glucose-Capillary 88 70 - 99 mg/dL    Comment: Glucose reference range applies only to samples taken after fasting for at least 8 hours.  Glucose, capillary     Status: None   Collection Time: 08/08/20 12:06 PM  Result Value Ref Range   Glucose-Capillary 99 70 - 99 mg/dL    Comment: Glucose reference range applies only to samples taken after fasting for at least 8 hours.  Glucose, capillary     Status: Abnormal   Collection Time: 08/08/20  7:35 PM  Result Value Ref Range   Glucose-Capillary 157 (H) 70 - 99 mg/dL    Comment: Glucose reference range applies only to samples taken after fasting for at least 8 hours.  Glucose, capillary     Status: Abnormal   Collection Time: 08/09/20 12:15 AM  Result Value Ref Range   Glucose-Capillary 201 (H) 70 - 99 mg/dL    Comment: Glucose reference range applies only to samples taken after fasting for at least 8 hours.  Basic metabolic panel     Status: Abnormal   Collection Time: 08/09/20  3:33 AM  Result Value Ref Range   Sodium 141 135 - 145 mmol/L   Potassium 4.2 3.5 - 5.1 mmol/L   Chloride 107 98 - 111 mmol/L   CO2 24 22 -  32 mmol/L   Glucose, Bld 142 (H) 70 - 99 mg/dL    Comment: Glucose reference range applies only to samples taken after fasting for at least 8 hours.   BUN 30 (H) 8 - 23 mg/dL   Creatinine, Ser 2.950.84 0.61 - 1.24 mg/dL   Calcium 8.1 (L) 8.9 - 10.3 mg/dL   GFR, Estimated >62>60 >13>60 mL/min    Comment: (NOTE) Calculated using the CKD-EPI Creatinine Equation (2021)    Anion gap 10 5 - 15    Comment: Performed at [facility], [address] [place], [place] [address]   Glucose, capillary     Status: Abnormal   Collection Time: 08/09/20  7:04 AM  Result Value Ref Range   Glucose-Capillary 224 (H) 70 - 99 mg/dL    Comment: Glucose reference range applies only to samples taken after fasting for at least 8 hours.  Glucose, capillary     Status: Abnormal   Collection Time: 08/09/20  7:58 AM  Result Value Ref Range   Glucose-Capillary 240 (H) 70 - 99 mg/dL    Comment: Glucose reference range applies only to samples taken after fasting for at least 8 hours.  Glucose, capillary     Status: Abnormal   Collection Time: 08/09/20 11:48 AM  Result Value Ref Range   Glucose-Capillary 176 (H) 70 - 99 mg/dL    Comment: Glucose reference range applies only to samples taken after fasting for at least 8 hours.  Glucose, capillary     Status: Abnormal   Collection Time: 08/09/20  4:33 PM  Result Value Ref Range   Glucose-Capillary 129 (H) 70 - 99 mg/dL    Comment: Glucose reference range applies only to samples taken after fasting for at least 8 hours.  Glucose, capillary     Status: Abnormal   Collection Time: 08/09/20  8:42 PM  Result Value Ref Range   Glucose-Capillary 119 (H) 70 - 99 mg/dL    Comment: Glucose reference range applies only to samples taken after fasting for at least 8 hours.  Glucose, capillary     Status: Abnormal   Collection Time: 08/09/20 11:26 PM  Result Value Ref Range   Glucose-Capillary 160 (H) 70 - 99 mg/dL    Comment: Glucose reference range applies only to samples taken after fasting for at least 8 hours.  Glucose, capillary     Status: Abnormal   Collection Time: 08/10/20  3:29 AM  Result Value Ref Range   Glucose-Capillary 102 (H) 70 - 99 mg/dL    Comment: Glucose reference range applies only to samples taken after fasting for at least 8 hours.  CBC     Status: Abnormal   Collection Time: 08/10/20  4:28 AM  Result Value Ref Range   WBC 9.1 4.0 - 10.5 K/uL   RBC 3.67 (L) 4.22 - 5.81 MIL/uL   Hemoglobin 11.0 (L) 13.0 - 17.0 g/dL    HCT 84.634.5 (L) 96.239.0 - 52.0 %   MCV 94.0 80.0 - 100.0 fL   MCH 30.0 26.0 - 34.0 pg   MCHC 31.9 30.0 - 36.0 g/dL   RDW 95.216.1 (H) 84.111.5 - 32.415.5 %   Platelets 354 150 - 400 K/uL   nRBC 0.0 0.0 - 0.2 %    Comment: Performed at [facility], [address] [place], [place] [address]  Basic metabolic panel     Status: Abnormal   Collection Time: 08/10/20  4:28 AM  Result Value Ref Range  Sodium 138 135 - 145 mmol/L   Potassium 4.3 3.5 - 5.1 mmol/L   Chloride 104 98 - 111 mmol/L   CO2 26 22 - 32 mmol/L   Glucose, Bld 125 (H) 70 - 99 mg/dL    Comment: Glucose reference range applies only to samples taken after fasting for at least 8 hours.   BUN 24 (H) 8 - 23 mg/dL   Creatinine, Ser 4.43 (L) 0.61 - 1.24 mg/dL   Calcium 8.3 (L) 8.9 - 10.3 mg/dL   GFR, Estimated >15 >40 mL/min    Comment: (NOTE) Calculated using the CKD-EPI Creatinine Equation (2021)    Anion gap 8 5 - 15    Comment: Performed at [facility], [address], [place], [place] [address]   DG Abd Portable 1V  Result Date: 08/08/2020 CLINICAL DATA:  Feeding tube adjustment. EXAM: PORTABLE ABDOMEN - 1 VIEW COMPARISON:  None. FINDINGS: Tip of the weighted enteric tube in the right upper quadrant in the region of the distal stomach. No bowel dilatation to suggest obstruction. There is contrast in the colon from prior swallowing function. IMPRESSION: Tip of the weighted enteric tube in the right upper quadrant in the region of the distal stomach. Electronically Signed   By: Narda Rutherford M.D.   On: 08/08/2020 16:23       Medical Problem List and Plan: 1.  Debility with altered mental status secondary to CAD/anterior STEMI /cardiogenic shock with occluded proximal LAD status post stenting post subacute left frontal infarct  -patient may shower  -ELOS/Goals: 16-20 days, Mod I with PT/OT, supervision SLP 2.  Antithrombotics: -DVT/anticoagulation: Lovenox  -antiplatelet therapy: Aspirin 81 mg daily, Brilinta 90 mg  twice daily 3. Pain Management: Oxycodone as needed 4. Mood: Klonopin 0.5 mg 3 times daily  -antipsychotic agents: N/A 5. Neuropsych: This patient is not capable of making decisions on his own behalf.  -is fall risk, safety plan needed 6. Skin/Wound Care: Routine skin checks 7. Fluids/Electrolytes/Nutrition: Routine in and outs with follow-up chemistries on admit. 8.  Acute hypoxemic respiratory failure.  Patient extubated.  Suspect HCAP completing 7-day course of antibiotic therapy 9.  Right pleural effusion.  Status postthoracentesis 08/01/2020 with 450 cc fluid removal.  Follow-up chest x-ray 08/02/2020 without effusion 10.  Dysphagia.  NPO. NGT feedings currently  -f/u MBS, plan per SLP 11.  Hypertension.  Coreg 3.125 mg twice daily, Lanoxin 0.125 mg daily 12.  Hyperlipidemia.  Crestor 13.  New findings diabetes mellitus.  Hemoglobin A1c 6.5.  SSI.  NovoLog 3 units every 4 hours.  Diabetic teaching 14.  Tobacco abuse.  NicoDerm patch.  Counseling   Charlton Amor, PA-C 08/10/2020

## 2020-08-09 NOTE — Progress Notes (Addendum)
Patient ID: Vincent Horton, male   DOB: 09/16/1955, 65 y.o.   MRN: [id number] P    Advanced Heart Failure Rounding Note  PCP-Cardiologist: None   Subjective:    MBS completed 6/6. + Severe oropharyngeal dysphagia. Remains high aspiration risk. To continue NPO per SLP. Has Cortrak for TFs but unable to secure SNF placement w/ CorTrack. Will need PEG tube, pending family consent.   Otherwise, stable from cardiac standpoint. Volume status ok. BP soft but stable. Currently in bed resting comfortable. No distress or agitation.   Neuro plans to see today for punctate acute to subacute frontal infarct.   Objective:   Weight Range: 45.3 kg Body mass index is 17.14 kg/m.   Vital Signs:   Temp:  [97.8 F (36.6 C)-98.2 F (36.8 C)] 97.8 F (36.6 C) (06/09 0520) Pulse Rate:  [95-112] 95 (06/09 0520) Resp:  [17-19] 18 (06/09 0520) BP: (90-113)/(61-95) 97/63 (06/09 0520) SpO2:  [95 %-97 %] 96 % (06/09 0520) Weight:  [45.3 kg] 45.3 kg (06/09 0520) Last BM Date: 08/08/20  Weight change: Filed Weights   08/08/20 0500 08/08/20 0635 08/09/20 0520  Weight: 48.5 kg 46.6 kg 45.3 kg    Intake/Output:   Intake/Output Summary (Last 24 hours) at 08/09/2020 0729 Last data filed at 08/09/2020 0400 Gross per 24 hour  Intake 0 ml  Output 200 ml  Net -200 ml      Physical Exam   PHYSICAL EXAM: General: thin cachetic appearing. No respiratory difficulty HEENT: normal + cortrak  Neck: supple. no JVD. Carotids 2+ bilat; no bruits. No lymphadenopathy or thyromegaly appreciated. Cor: PMI nondisplaced. Regular rate & rhythm. No rubs, gallops or murmurs. Lungs: clear Abdomen: soft, nontender, nondistended. No hepatosplenomegaly. No bruits or masses. Good bowel sounds. Extremities: no cyanosis, clubbing, rash, edema Neuro: alert & oriented x 3, cranial nerves grossly intact. moves all 4 extremities w/o difficulty. Affect pleasant.    Telemetry   NSR 90s   Labs    CBC Recent Labs     08/06/20 2044 08/08/20 0457  WBC 11.0* 12.2*  NEUTROABS 8.2*  --   HGB 10.5* 11.7*  HCT 33.4* 38.3*  MCV 94.6 97.5  PLT 348 359   Basic Metabolic Panel Recent Labs    12/26/83 0457 08/09/20 0333  NA 139 141  K 4.5 4.2  CL 108 107  CO2 20* 24  GLUCOSE 76 142*  BUN 23 30*  CREATININE 0.75 0.84  CALCIUM 8.2* 8.1*  MG 2.2  --    Liver Function Tests No results for input(s): AST, ALT, ALKPHOS, BILITOT, PROT, ALBUMIN in the last 72 hours. No results for input(s): LIPASE, AMYLASE in the last 72 hours. Cardiac Enzymes No results for input(s): CKTOTAL, CKMB, CKMBINDEX, TROPONINI in the last 72 hours.  BNP: BNP (last 3 results) No results for input(s): BNP in the last 8760 hours.  ProBNP (last 3 results) No results for input(s): PROBNP in the last 8760 hours.   D-Dimer No results for input(s): DDIMER in the last 72 hours. Hemoglobin A1C No results for input(s): HGBA1C in the last 72 hours. Fasting Lipid Panel No results for input(s): CHOL, HDL, LDLCALC, TRIG, CHOLHDL, LDLDIRECT in the last 72 hours. Thyroid Function Tests No results for input(s): TSH, T4TOTAL, T3FREE, THYROIDAB in the last 72 hours.  Invalid input(s): FREET3  Other results:   Imaging    DG Abd Portable 1V  Result Date: 08/08/2020 CLINICAL DATA:  Feeding tube adjustment. EXAM: PORTABLE ABDOMEN - 1 VIEW COMPARISON:  None.  FINDINGS: Tip of the weighted enteric tube in the right upper quadrant in the region of the distal stomach. No bowel dilatation to suggest obstruction. There is contrast in the colon from prior swallowing function. IMPRESSION: Tip of the weighted enteric tube in the right upper quadrant in the region of the distal stomach. Electronically Signed   By: Narda Rutherford M.D.   On: 08/08/2020 16:23      Medications:     Scheduled Medications:  aspirin  81 mg Per Tube Daily   carvedilol  3.125 mg Per Tube BID WC   chlorhexidine  15 mL Mouth Rinse BID   clonazepam  0.5 mg Per Tube  TID   digoxin  0.125 mg Per Tube Daily   enoxaparin (LOVENOX) injection  30 mg Subcutaneous Daily   famotidine  20 mg Per Tube BID   feeding supplement (PROSource TF)  45 mL Per Tube BID   free water  100 mL Per Tube Q4H   insulin aspart  0-15 Units Subcutaneous Q4H   insulin aspart  3 Units Subcutaneous Q4H   mouth rinse  15 mL Mouth Rinse q12n4p   nicotine  14 mg Transdermal Daily   oxyCODONE  5 mg Per Tube Q8H   rosuvastatin  40 mg Per Tube Daily   ticagrelor  90 mg Per Tube BID    Infusions:  sodium chloride Stopped (08/06/20 0521)   feeding supplement (OSMOLITE 1.5 CAL) 1,000 mL (08/06/20 2131)    PRN Medications: acetaminophen (TYLENOL) oral liquid 160 mg/5 mL, guaiFENesin-dextromethorphan, haloperidol lactate, ipratropium-albuterol, loperamide HCl, ondansetron (ZOFRAN) IV    Assessment/Plan   1. CAD: Anterior STEMI with occluded proximal LAD.  Treated with DES but complicated by embolization to distal LAD (procedure completed with occluded distal LAD).   - No chest pain.  - Continue ticagrelor and ASA 81.  - Crestor 40 daily. 2. Cardiogenic shock: Ischemic cardiomyopathy. Echo with EF 30-35%, no LV thrombus, LAD territory WMAs, RV normal, IVC normal.  - Volume status ok. No diuretic needs currently  - Continue digoxin 0.125. dig level ok 6/7  3. Acute hypoxemic respiratory failure: Now extubated.  Suspect HCAP. WBCs continues to come down. Finished Zosyn 5/29, started vancomycin/ceftazidime 5/31, completed 7 days total.  Cultures NGTD.  4. Cardiac arrest: VF arrest associated with STEMI. Patient shocked x 5, 15 min CPR before ROSC.  No further VT.  Now rewarmed s/p hypothermia protocol.  He has been revascularized.  5. Anemia: Transfused 1 U on 5/27, Hgb up to 11 6. Paroxysmal Atrial fibrillation: We have not seen any actual atrial fibrillation, has had ST with PACs.  Currently NSR - Lovenox prophylaxis - Now off amio. C/w Coreg   7. Rt Pleural Effusion: s/p  thoracentesis 6/1 w/ 450 cc fluid removal. F/u CXR 6/2 w/o effusion.  8. Delirium: improving. C/w bedside sitting  9. Hypernatremia: Na 141 today Per primary team.   10. Dysphagia - s/p MBS 6/6. Severe oropharyngeal dysphagia  Remains high aspiration risk.  - To continue NPO per SLP.  - Has Cortrak for TFs   - Will need PEG pending family consent. Palliative Care team following to discuss GOC  11. Debility: PT recommending SNF but cannot secure placement w/ cortrak in place. ? CIR  12. CVA: MRI 6/7 with punctate acute to subacute frontal infarct. Suspect this does not explain his delirium.  We have not seen definite atrial fibrillation and he is on aspirin + ticagrelor right now.   - neurology has been  consulted. They will see today  - ?LINQ monitor prior to discharge.   Vincent Lis, PA-C 08/09/2020 7:29 AM  Patient seen with PA, agree with the above note.   He is stable, son at bedside.  Per son, he is responding appropriately.  Still NPO with Cortrack.    No BP room to increase cardiac meds, remains off midodrine.    Neurology to see regarding CVA, see discussion above.   Vincent Horton 08/09/2020 2:51 PM

## 2020-08-09 NOTE — Care Management Important Message (Signed)
Important Message  Patient Details  Name: Vincent Horton MRN: [id number] Date of Birth: 07/10/1955   Medicare Important Message Given:  Yes     Renie Ora 08/09/2020, 12:25 PM

## 2020-08-09 NOTE — Progress Notes (Signed)
Physical Therapy Treatment Patient Details Name: Vincent Horton MRN: [id number] DOB: 1955-09-15 Today's Date: 08/09/2020    History of Present Illness Pt is a 65 y.o. male seen on 07/21/2020 for evaluation of STEMI. Upon EMS arrival, pt apneic and was in VFib and shocked 5 times with 15-min CPR before ROSC achieved. Pt intubated on site (5/21) and transferred to [facility]. CXR with diffuse bilateral pulmonary infiltrates/edema, atelectasis, small R pleural effusion. ETT 5/21-5/31. Course complicated by acute metabolic encephalopathy; suspect secondary to cardiac arrest with prolonged downtime, suspicious of anoxic brain injury. Head CT with hypodensity deep white matter bilateral frontal lobes, likely secondary to chronic microvascular ischemia; no cerebral edema. EEG negative for epileptiform activity. MRI 6/7 with punctate acute to subacute L frontal infarct; no evidence of hypoxic/ischemic injury. PMH includes cardiac arrest, CAD, dysrhythmia, MI.   PT Comments    Pt progressing with mobility. Today's session focused on bed mobility, as well as transfer and gait training with and without RW. Pt's preference is for UE support with standing activity, although was not using DME prior to admission. With or without device, pt requires up to maxA for stability and safety with ambulation. Pt remains limited by generalized weakness, decreased activity tolerance, poor balance strategies, and cognitive impairments, including decreased safety awareness. Continue to recommend intensive CIR-level therapies to maximize functional mobility and independence prior to return home.    Follow Up Recommendations  CIR;Supervision for mobility/OOB     Equipment Recommendations   (TBD)    Recommendations for Other Services       Precautions / Restrictions Precautions Precautions: Fall;Other (comment) Precaution Comments: cortrak Restrictions Weight Bearing Restrictions: No    Mobility  Bed Mobility Overal bed mobility:  Needs Assistance Bed Mobility: Supine to Sit     Supine to sit: Supervision;HOB elevated;Min assist     General bed mobility comments: Initial supervision to come to sitting EOB; pt then reaching back for phone charger with posterior LOB, requiring up to minA for repeated partial sidelying<>sit suspect related to increased fatigue    Transfers Overall transfer level: Needs assistance Equipment used: None;Rolling walker (2 wheeled) Transfers: Sit to/from Stand Sit to Stand: Mod assist         General transfer comment: Multiple sit<>stands from EOB, recliner and chair without armrests, no DME, pt requiring consistent modA for stability upon standing, reliant on UE support; additional trial with RW, minA for stability and poor safety awareness with RW management  Ambulation/Gait Ambulation/Gait assistance: Mod assist;Max assist;Min assist Gait Distance (Feet): 14 Feet (+28) Assistive device: None;Rolling walker (2 wheeled) Gait Pattern/deviations: Step-through pattern;Decreased stride length;Shuffle;Trunk flexed;Narrow base of support;Staggering right;Staggering left     General Gait Details: Slow, unsteady gait without DME, pt insistent on use of RW and reaching far outside of BOS to get despite verbal cues to try without, mod-maxA for stability; additional gait training with RW, pt with poor safety awareness and RW management, requiring consistent minA for balance, up to mod-maxA for stability and RW management; at one point attempting to turn and sit on RW bar; 1x seated rest break for fatigue and safety   Stairs             Wheelchair Mobility    Modified Rankin (Stroke Patients Only)       Balance Overall balance assessment: Needs assistance Sitting-balance support: Bilateral upper extremity supported;Feet supported Sitting balance-Leahy Scale: Fair     Standing balance support: Bilateral upper extremity supported Standing balance-Leahy Scale: Poor Standing  balance comment:  Reliant on external assist to maintain static standing balance; performed ADL tasks at sink with either single UE or trunk support against counter, external assist as well to maintain balance                            Cognition Arousal/Alertness: Awake/alert Behavior During Therapy: Flat affect Overall Cognitive Status: Impaired/Different from baseline Area of Impairment: Attention;Following commands;Safety/judgement;Awareness;Problem solving                   Current Attention Level: Sustained;Selective   Following Commands: Follows one step commands with increased time Safety/Judgement: Decreased awareness of safety;Decreased awareness of deficits Awareness: Intellectual;Emergent Problem Solving: Difficulty sequencing;Requires verbal cues;Requires tactile cues General Comments: Minimal audible verbal communication beyond head nods and quietlyspeaking a few English words; following majority of simple commands in [place]. Still distracted by lines, but more easily redirected. Decreased safety awareness (attempting to turn and sit on RW bar) requiring frequent verbal cues for safety and sequencing. Still with increased flat affect; no family present this session      Exercises      General Comments General comments (skin integrity, edema, etc.): SpO2 96% on RA, HR up to 110s      Pertinent Vitals/Pain Pain Assessment: No/denies pain Pain Intervention(s): Monitored during session    Home Living                      Prior Function            PT Goals (current goals can now be found in the care plan section) Progress towards PT goals: Progressing toward goals    Frequency    Min 4X/week      PT Plan Current plan remains appropriate    Co-evaluation              AM-PAC PT "6 Clicks" Mobility   Outcome Measure  Help needed turning from your back to your side while in a flat bed without using bedrails?: A Little Help  needed moving from lying on your back to sitting on the side of a flat bed without using bedrails?: A Little Help needed moving to and from a bed to a chair (including a wheelchair)?: A Lot Help needed standing up from a chair using your arms (e.g., wheelchair or bedside chair)?: A Lot Help needed to walk in hospital room?: A Lot Help needed climbing 3-5 steps with a railing? : Total 6 Click Score: 13    End of Session Equipment Utilized During Treatment: Gait belt Activity Tolerance: Patient tolerated treatment well Patient left: in chair;with call bell/phone within reach;with chair alarm set Nurse Communication: Mobility status PT Visit Diagnosis: Unsteadiness on feet (R26.81);Other abnormalities of gait and mobility (R26.89);Muscle weakness (generalized) (M62.81)     Time: 1610-9604 PT Time Calculation (min) (ACUTE ONLY): 26 min  Charges:  $Gait Training: 8-22 mins $Therapeutic Activity: 8-22 mins                     Ina Homes, PT, DPT Acute Rehabilitation Services  Pager [phone] Office [phone]  Malachy Chamber 08/09/2020, 9:46 AM

## 2020-08-09 NOTE — Progress Notes (Signed)
Inpatient Rehab Admissions Coordinator:   I have no beds available for this patient to admit to CIR today.  Will continue to follow for timing of potential admission pending bed availability.   Estill Dooms, PT, DPT Admissions Coordinator [phone] 08/09/20  12:46 PM

## 2020-08-10 ENCOUNTER — Inpatient Hospital Stay (HOSPITAL_COMMUNITY): Payer: Medicare Other

## 2020-08-10 ENCOUNTER — Other Ambulatory Visit: Payer: Self-pay

## 2020-08-10 ENCOUNTER — Encounter (HOSPITAL_COMMUNITY): Payer: Self-pay | Admitting: Physical Medicine & Rehabilitation

## 2020-08-10 ENCOUNTER — Inpatient Hospital Stay (HOSPITAL_COMMUNITY)
Admission: RE | Admit: 2020-08-10 | Discharge: 2020-08-24 | DRG: 945 | Disposition: A | Discharge status: Home / Self Care | Payer: Medicare Other | Source: Intra-hospital | Attending: Physical Medicine & Rehabilitation | Admitting: Physical Medicine & Rehabilitation

## 2020-08-10 DIAGNOSIS — I429 Cardiomyopathy, unspecified: Secondary | ICD-10-CM | POA: Diagnosis present

## 2020-08-10 DIAGNOSIS — I6330 Cerebral infarction due to thrombosis of unspecified cerebral artery: Secondary | ICD-10-CM

## 2020-08-10 DIAGNOSIS — I6300 Cerebral infarction due to thrombosis of unspecified precerebral artery: Secondary | ICD-10-CM

## 2020-08-10 DIAGNOSIS — I63412 Cerebral infarction due to embolism of left middle cerebral artery: Secondary | ICD-10-CM | POA: Diagnosis not present

## 2020-08-10 DIAGNOSIS — B9689 Other specified bacterial agents as the cause of diseases classified elsewhere: Secondary | ICD-10-CM | POA: Diagnosis present

## 2020-08-10 DIAGNOSIS — N3091 Cystitis, unspecified with hematuria: Secondary | ICD-10-CM | POA: Diagnosis present

## 2020-08-10 DIAGNOSIS — R4182 Altered mental status, unspecified: Secondary | ICD-10-CM | POA: Diagnosis present

## 2020-08-10 DIAGNOSIS — Z4659 Encounter for fitting and adjustment of other gastrointestinal appliance and device: Secondary | ICD-10-CM

## 2020-08-10 DIAGNOSIS — F419 Anxiety disorder, unspecified: Secondary | ICD-10-CM | POA: Diagnosis present

## 2020-08-10 DIAGNOSIS — I6389 Other cerebral infarction: Secondary | ICD-10-CM

## 2020-08-10 DIAGNOSIS — G931 Anoxic brain damage, not elsewhere classified: Secondary | ICD-10-CM | POA: Diagnosis present

## 2020-08-10 DIAGNOSIS — E119 Type 2 diabetes mellitus without complications: Secondary | ICD-10-CM | POA: Diagnosis present

## 2020-08-10 DIAGNOSIS — R5381 Other malaise: Secondary | ICD-10-CM | POA: Diagnosis present

## 2020-08-10 DIAGNOSIS — R269 Unspecified abnormalities of gait and mobility: Secondary | ICD-10-CM | POA: Diagnosis present

## 2020-08-10 DIAGNOSIS — Z955 Presence of coronary angioplasty implant and graft: Secondary | ICD-10-CM

## 2020-08-10 DIAGNOSIS — J90 Pleural effusion, not elsewhere classified: Secondary | ICD-10-CM

## 2020-08-10 DIAGNOSIS — I10 Essential (primary) hypertension: Secondary | ICD-10-CM | POA: Diagnosis present

## 2020-08-10 DIAGNOSIS — Z8673 Personal history of transient ischemic attack (TIA), and cerebral infarction without residual deficits: Secondary | ICD-10-CM | POA: Diagnosis not present

## 2020-08-10 DIAGNOSIS — I2109 ST elevation (STEMI) myocardial infarction involving other coronary artery of anterior wall: Secondary | ICD-10-CM | POA: Diagnosis present

## 2020-08-10 DIAGNOSIS — R059 Cough, unspecified: Secondary | ICD-10-CM | POA: Diagnosis not present

## 2020-08-10 DIAGNOSIS — Z8674 Personal history of sudden cardiac arrest: Secondary | ICD-10-CM | POA: Diagnosis not present

## 2020-08-10 DIAGNOSIS — E785 Hyperlipidemia, unspecified: Secondary | ICD-10-CM | POA: Diagnosis present

## 2020-08-10 DIAGNOSIS — F17200 Nicotine dependence, unspecified, uncomplicated: Secondary | ICD-10-CM | POA: Diagnosis present

## 2020-08-10 DIAGNOSIS — I2510 Atherosclerotic heart disease of native coronary artery without angina pectoris: Secondary | ICD-10-CM | POA: Diagnosis present

## 2020-08-10 DIAGNOSIS — I639 Cerebral infarction, unspecified: Secondary | ICD-10-CM

## 2020-08-10 DIAGNOSIS — R1312 Dysphagia, oropharyngeal phase: Secondary | ICD-10-CM | POA: Diagnosis present

## 2020-08-10 DIAGNOSIS — J811 Chronic pulmonary edema: Secondary | ICD-10-CM | POA: Diagnosis not present

## 2020-08-10 DIAGNOSIS — I63519 Cerebral infarction due to unspecified occlusion or stenosis of unspecified middle cerebral artery: Secondary | ICD-10-CM | POA: Diagnosis not present

## 2020-08-10 LAB — GLUCOSE, CAPILLARY
Glucose-Capillary: 102 mg/dL — ABNORMAL HIGH (ref 70–99)
Glucose-Capillary: 141 mg/dL — ABNORMAL HIGH (ref 70–99)
Glucose-Capillary: 143 mg/dL — ABNORMAL HIGH (ref 70–99)
Glucose-Capillary: 144 mg/dL — ABNORMAL HIGH (ref 70–99)
Glucose-Capillary: 156 mg/dL — ABNORMAL HIGH (ref 70–99)
Glucose-Capillary: 58 mg/dL — ABNORMAL LOW (ref 70–99)
Glucose-Capillary: 66 mg/dL — ABNORMAL LOW (ref 70–99)

## 2020-08-10 LAB — CBC
HCT: 34.5 % — ABNORMAL LOW (ref 39.0–52.0)
Hemoglobin: 11 g/dL — ABNORMAL LOW (ref 13.0–17.0)
MCH: 30 pg (ref 26.0–34.0)
MCHC: 31.9 g/dL (ref 30.0–36.0)
MCV: 94 fL (ref 80.0–100.0)
Platelets: 354 10*3/uL (ref 150–400)
RBC: 3.67 MIL/uL — ABNORMAL LOW (ref 4.22–5.81)
RDW: 16.1 % — ABNORMAL HIGH (ref 11.5–15.5)
WBC: 9.1 10*3/uL (ref 4.0–10.5)
nRBC: 0 % (ref 0.0–0.2)

## 2020-08-10 LAB — ECHOCARDIOGRAM COMPLETE
Area-P 1/2: 4.54 cm2
Calc EF: 38.5 %
Height: 64 in
S' Lateral: 2.5 cm
Single Plane A2C EF: 38.7 %
Single Plane A4C EF: 42.7 %
Weight: 1597.89 oz

## 2020-08-10 LAB — BASIC METABOLIC PANEL
Anion gap: 8 (ref 5–15)
BUN: 24 mg/dL — ABNORMAL HIGH (ref 8–23)
CO2: 26 mmol/L (ref 22–32)
Calcium: 8.3 mg/dL — ABNORMAL LOW (ref 8.9–10.3)
Chloride: 104 mmol/L (ref 98–111)
Creatinine, Ser: 0.59 mg/dL — ABNORMAL LOW (ref 0.61–1.24)
GFR, Estimated: >60 mL/min (ref >60)
Glucose, Bld: 125 mg/dL — ABNORMAL HIGH (ref 70–99)
Potassium: 4.3 mmol/L (ref 3.5–5.1)
Sodium: 138 mmol/L (ref 135–145)

## 2020-08-10 MED ORDER — ROSUVASTATIN CALCIUM 40 MG PO TABS: 40.0000 mg | ORAL_TABLET | Freq: Every day | ORAL | Status: DC

## 2020-08-10 MED ORDER — INSULIN ASPART 100 UNIT/ML IJ SOLN: 0.0000 [IU] | INTRAMUSCULAR | 11 refills | Status: DC

## 2020-08-10 MED ORDER — GUAIFENESIN-DM 100-10 MG/5ML PO SYRP: 10.0000 mL | ORAL_SOLUTION | ORAL | 0 refills | Status: DC | PRN

## 2020-08-10 MED ORDER — NICOTINE 14 MG/24HR TD PT24
14.0000 mg | MEDICATED_PATCH | Freq: Every day | TRANSDERMAL | Status: DC
  Administered 2020-08-11 – 2020-08-23 (×8): 14 mg via TRANSDERMAL
  Filled 2020-08-10 (×15): qty 1

## 2020-08-10 MED ORDER — LOPERAMIDE HCL 1 MG/7.5ML PO SUSP
1.0000 mg | ORAL | Status: DC | PRN
  Filled 2020-08-10: qty 7.5

## 2020-08-10 MED ORDER — IPRATROPIUM-ALBUTEROL 0.5-2.5 (3) MG/3ML IN SOLN
3.0000 mL | RESPIRATORY_TRACT | Status: DC | PRN
Start: 2020-08-10 — End: 2020-08-24

## 2020-08-10 MED ORDER — CLONAZEPAM 0.25 MG PO TBDP
0.5000 mg | ORAL_TABLET | Freq: Three times a day (TID) | ORAL | Status: DC
  Administered 2020-08-10 – 2020-08-21 (×31): 0.5 mg
  Filled 2020-08-10 (×15): qty 2
  Filled 2020-08-10: qty 4
  Filled 2020-08-10 (×17): qty 2

## 2020-08-10 MED ORDER — SPIRONOLACTONE 25 MG PO TABS: 12.5000 mg | ORAL_TABLET | Freq: Every day | ORAL | 11 refills | Status: DC

## 2020-08-10 MED ORDER — OXYCODONE HCL 5 MG PO TABS: 5.0000 mg | ORAL_TABLET | Freq: Four times a day (QID) | ORAL | 0 refills | Status: DC | PRN

## 2020-08-10 MED ORDER — LOPERAMIDE HCL 1 MG/7.5ML PO SUSP: 1.0000 mg | ORAL | 0 refills | Status: DC | PRN

## 2020-08-10 MED ORDER — SPIRONOLACTONE 12.5 MG HALF TABLET
12.5000 mg | ORAL_TABLET | Freq: Every day | ORAL | Status: DC
  Administered 2020-08-10: 12.5 mg via ORAL
  Filled 2020-08-10: qty 1

## 2020-08-10 MED ORDER — FREE WATER
150.0000 mL | Status: DC
  Administered 2020-08-10 – 2020-08-14 (×21): 150 mL

## 2020-08-10 MED ORDER — DIGOXIN 125 MCG PO TABS: 0.1250 mg | ORAL_TABLET | Freq: Every day | ORAL | Status: DC

## 2020-08-10 MED ORDER — INSULIN ASPART 100 UNIT/ML IJ SOLN
3.0000 [IU] | INTRAMUSCULAR | Status: DC
  Administered 2020-08-10 (×2): 3 [IU] via SUBCUTANEOUS

## 2020-08-10 MED ORDER — ACETAMINOPHEN 160 MG/5ML PO SOLN: 650.0000 mg | Freq: Four times a day (QID) | ORAL | Status: DC | PRN

## 2020-08-10 MED ORDER — OSMOLITE 1.5 CAL PO LIQD
1000.0000 mL | ORAL | Status: DC
  Administered 2020-08-10 – 2020-08-15 (×5): 1000 mL
  Filled 2020-08-10 (×7): qty 1000

## 2020-08-10 MED ORDER — ENOXAPARIN SODIUM 30 MG/0.3ML IJ SOSY
30.0000 mg | PREFILLED_SYRINGE | INTRAMUSCULAR | Status: DC
  Administered 2020-08-11 – 2020-08-18 (×8): 30 mg via SUBCUTANEOUS
  Filled 2020-08-10 (×10): qty 0.3

## 2020-08-10 MED ORDER — INSULIN ASPART 100 UNIT/ML IJ SOLN
0.0000 [IU] | INTRAMUSCULAR | Status: DC
  Administered 2020-08-10 (×2): 2 [IU] via SUBCUTANEOUS
  Administered 2020-08-11 (×2): 3 [IU] via SUBCUTANEOUS
  Administered 2020-08-11: 2 [IU] via SUBCUTANEOUS
  Administered 2020-08-12: 3 [IU] via SUBCUTANEOUS
  Administered 2020-08-12: 5 [IU] via SUBCUTANEOUS
  Administered 2020-08-12: 2 [IU] via SUBCUTANEOUS
  Administered 2020-08-12 (×2): 3 [IU] via SUBCUTANEOUS
  Administered 2020-08-13: 2 [IU] via SUBCUTANEOUS
  Administered 2020-08-13 (×2): 3 [IU] via SUBCUTANEOUS
  Administered 2020-08-13 – 2020-08-14 (×3): 2 [IU] via SUBCUTANEOUS
  Administered 2020-08-14: 3 [IU] via SUBCUTANEOUS
  Administered 2020-08-14 – 2020-08-15 (×2): 2 [IU] via SUBCUTANEOUS
  Administered 2020-08-15 – 2020-08-16 (×6): 3 [IU] via SUBCUTANEOUS
  Administered 2020-08-17 (×2): 2 [IU] via SUBCUTANEOUS
  Administered 2020-08-17 – 2020-08-18 (×3): 3 [IU] via SUBCUTANEOUS
  Administered 2020-08-18: 2 [IU] via SUBCUTANEOUS

## 2020-08-10 MED ORDER — FREE WATER: 100.0000 mL | Status: DC

## 2020-08-10 MED ORDER — IPRATROPIUM-ALBUTEROL 0.5-2.5 (3) MG/3ML IN SOLN: 3.0000 mL | RESPIRATORY_TRACT | Status: DC | PRN

## 2020-08-10 MED ORDER — CARVEDILOL 3.125 MG PO TABS: 3.1250 mg | ORAL_TABLET | Freq: Two times a day (BID) | ORAL | Status: DC

## 2020-08-10 MED ORDER — ASPIRIN 81 MG PO CHEW
81.0000 mg | CHEWABLE_TABLET | Freq: Every day | ORAL | Status: DC
Start: 2020-08-11 — End: 2020-08-24

## 2020-08-10 MED ORDER — CLONAZEPAM 0.5 MG PO TBDP: 0.5000 mg | ORAL_TABLET | Freq: Two times a day (BID) | ORAL | 0 refills | Status: DC | PRN

## 2020-08-10 MED ORDER — OXYCODONE HCL 5 MG PO TABS
5.0000 mg | ORAL_TABLET | Freq: Three times a day (TID) | ORAL | Status: DC
  Administered 2020-08-10 – 2020-08-16 (×15): 5 mg
  Filled 2020-08-10 (×15): qty 1

## 2020-08-10 MED ORDER — ENOXAPARIN SODIUM 30 MG/0.3ML IJ SOSY: 30.0000 mg | PREFILLED_SYRINGE | Freq: Every day | INTRAMUSCULAR | Status: DC

## 2020-08-10 MED ORDER — CARVEDILOL 3.125 MG PO TABS
3.1250 mg | ORAL_TABLET | Freq: Two times a day (BID) | ORAL | Status: DC
  Administered 2020-08-11 – 2020-08-12 (×2): 3.125 mg
  Filled 2020-08-10 (×10): qty 1

## 2020-08-10 MED ORDER — PROSOURCE TF PO LIQD: 45.0000 mL | Freq: Two times a day (BID) | ORAL | Status: DC

## 2020-08-10 MED ORDER — ROSUVASTATIN CALCIUM 20 MG PO TABS
40.0000 mg | ORAL_TABLET | Freq: Every day | ORAL | Status: DC
  Administered 2020-08-11 – 2020-08-21 (×10): 40 mg
  Filled 2020-08-10 (×12): qty 2

## 2020-08-10 MED ORDER — OSMOLITE 1.5 CAL PO LIQD: 1000.0000 mL | ORAL | 0 refills | Status: DC

## 2020-08-10 MED ORDER — PROSOURCE TF PO LIQD
45.0000 mL | Freq: Two times a day (BID) | ORAL | Status: DC
  Administered 2020-08-10 – 2020-08-17 (×13): 45 mL
  Filled 2020-08-10 (×15): qty 45

## 2020-08-10 MED ORDER — TICAGRELOR 90 MG PO TABS: 90.0000 mg | ORAL_TABLET | Freq: Two times a day (BID) | ORAL | Status: DC

## 2020-08-10 MED ORDER — TICAGRELOR 90 MG PO TABS
90.0000 mg | ORAL_TABLET | Freq: Two times a day (BID) | ORAL | Status: DC
  Administered 2020-08-10 – 2020-08-21 (×22): 90 mg
  Filled 2020-08-10 (×24): qty 1

## 2020-08-10 MED ORDER — FAMOTIDINE 20 MG PO TABS
20.0000 mg | ORAL_TABLET | Freq: Two times a day (BID) | ORAL | Status: DC
  Administered 2020-08-10 – 2020-08-19 (×17): 20 mg
  Filled 2020-08-10 (×19): qty 1

## 2020-08-10 MED ORDER — FAMOTIDINE 20 MG PO TABS
20.0000 mg | ORAL_TABLET | Freq: Two times a day (BID) | ORAL | Status: DC
Start: 2020-08-10 — End: 2020-08-24

## 2020-08-10 MED ORDER — INSULIN ASPART 100 UNIT/ML IJ SOLN: 3.0000 [IU] | INTRAMUSCULAR | 11 refills | Status: DC

## 2020-08-10 MED ORDER — NICOTINE 14 MG/24HR TD PT24: 14.0000 mg | MEDICATED_PATCH | Freq: Every day | TRANSDERMAL | 0 refills | Status: DC

## 2020-08-10 MED ORDER — PERFLUTREN LIPID MICROSPHERE
1.0000 mL | INTRAVENOUS | Status: DC | PRN
  Administered 2020-08-10: 2 mL via INTRAVENOUS

## 2020-08-10 MED ORDER — DIGOXIN 125 MCG PO TABS
0.1250 mg | ORAL_TABLET | Freq: Every day | ORAL | Status: DC
  Administered 2020-08-11 – 2020-08-21 (×9): 0.125 mg
  Filled 2020-08-10 (×11): qty 1

## 2020-08-10 MED ORDER — ASPIRIN 81 MG PO CHEW
81.0000 mg | CHEWABLE_TABLET | Freq: Every day | ORAL | Status: DC
  Administered 2020-08-11 – 2020-08-21 (×10): 81 mg
  Filled 2020-08-10 (×12): qty 1

## 2020-08-10 NOTE — Discharge Summary (Addendum)
Physician Discharge Summary  [name] ZOX:[id number] DOB: 11/29/1955 DOA: 07/21/2020  PCP: No primary care provider on file.  Admit date: 07/21/2020 Discharge date: 08/10/2020  Admitted From: Home Disposition: CIR    Recommendations for Outpatient Follow-up:  Follow up with PCP in 1-2 weeks Outpatient follow-up with cardiology  Outpatient follow-up with neurology Will need 2-week Zio patch on discharge If dysphagia does not improve, will need PEG tube placement (currently with Cortrack) Recommended palliative care to follow Follow-up carotid ultrasound and repeat TTE that was pending at time of discharge  Discharge Condition: Stable, but overall prognosis guarded CODE STATUS: Full code Diet recommendation: N.p.o., tube feeds  History of present illness:  Vincent Horton is a 65 year old male with limited past medical history who was initially found down by family at home.  EMS was activated and patient was found to be in ventricular fibrillation.  Patient was shocked 5 times and given epinephrine and amiodarone with ROSC obtained approximately within 15 minutes.  Patient was intubated, started on vasopressors for cardiogenic shock and transferred to the intensive care unit.  Patient also was emergently taken to the catheterization lab due to ST segment changes in anterior leads and underwent revascularization of his LAD.  Milrinone drip was slowly weaned off.  Patient was extubated on 07/31/2020 and transferred to the hospitalist service on 08/30/2020.  Hospital course:  Cardiac arrest Ventricular fibrillation STEMI CAD Patient presenting to ED after being found down at home by family members.  On EMS arrival, patient was noted to be in ventricular fibrillation, CPR was initiated and received 5 shocks with associated epinephrine and amiodarone with achievement of ROSC in about 15 minutes.  Patient underwent emergent heart catheterization and revascularization of LAD with DES.  Patient was  intubated and under care in the intensive care unit with post resuscitative targeted temperature management.  Cardiology followed during hospital course.  Started on carvedilol 3.125 mg p.o. twice daily, digoxin 0.125 mg p.o. daily, spironolactone 12.5 mg p.o. daily.  Continue aspirin, Brilinta, Crestor.  Outpatient follow-up with cardiology/electrophysiology.  Cardiology recommending 2-week Zio patch.   Ischemic cardiomyopathy Cardiogenic shock TTE 07/21/2020 with LVEF 30-35%, grade 1 diastolic dysfunction.  Initially required milrinone drip in the intensive care unit, which was titrated off.  Continue aspirin, Brilinta, Crestor.  Cardiology starting on low-dose spironolactone 12.5 mg per tube daily.  Follow-up with cardiology.   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  No evidence of hypoxic/ischemic injury.  Hemoglobin A1c 6.5.  LDL 108.  TTE with no intra-atrial shunt.  Neurology consulted.  Pending carotid ultrasound and repeat TTE at time of discharge.  Continue aspirin, Brilinta, Crestor.  Cardiology recommends Zio patch 2-week monitor on discharge.   Acute respiratory failure with hypoxia, POA: Resolved Healthcare associated pneumonia Chest x-ray notable for diffuse bilateral pulmonary infiltrates.  Patient completed extended antibiotic course with combination vancomycin, Zosyn, and ceftazidime.  Supplemental oxygen has been titrated off and now oxygenating well on room air.   Right pleural effusion Underwent thoracentesis on 08/01/2020 with 4 to 50 mL of fluid removal.  Follow-up chest x-ray 6/2 with resolution of effusion.   Anemia of chronic disease Patient transfused 1 unit PRBC on 07/27/2020. Hemoglobin stable, 11.0 at time of discharge.   Hypernatremia: Resolved Etiology likely secondary to free water deficit, poor oral intake and dysphagia. Continue tube feeds with free water flushes   Acute  metabolic encephalopathy: Improving Etiology likely secondary to  cardiac arrest with prolonged downtime, suspicious of anoxic brain injury.  CT head on admission with hypodensity deep white matter frontal lobes bilaterally likely secondary to chronic microvascular ischemia, no cerebral edema.  EEG negative for epileptiform activity.  MRI brain consistent with punctate left frontal CVA. Continue supportive care; restraints/mittens now off, sitter now discontinued   Severe oropharyngeal dysphagia Underwent modified barium swallow on 08/06/2020.  Continues n.p.o., continue tube feeds with free water flushes.  Currently with core track in place.  Will need PEG tube if family in agreement and no appreciable change in his swallowing ability.  Continue speech therapy on discharge.   Tobacco use disorder Nicotine patch, will eventually need further titration until discontinued.   Suspected COPD No official diagnosis outpatient.  By family reports long standing history of significant tobacco use. DuoNebs as needed shortness of breath/wheezing.  Recommend outpatient pulmonology follow-up and PFTs.   Ethics: Patient continues to make very poor progress following his cardiac/V. fib arrest with associated new onset ischemic cardiomyopathy requiring DES and a course of vasopressors, and findings of acute versus subacute left frontal CVA.  Patient continues with severe dysphagia requiring core track tube for tube feeds.  Palliative care has been consulted and following during hospital course for assistance with goals of care and medical decision making.  Family meeting on 08/07/2020 with patient's son and daughter to discuss options moving forward.  Discussed options to include a more palliative/comfort approach or continued aggressive treatment which will require PEG tube.  Now will discharge to CIR, recommend palliative care to continue to follow.  Discharge Diagnoses:  Principal Problem:   Acute ST elevation  myocardial infarction (STEMI) due to occlusion of left anterior descending (LAD) coronary artery [facility](HCC) Active Problems:   Cardiac arrest [facility](HCC)   STEMI involving left anterior descending coronary artery (HCC)   Protein calorie malnutrition (HCC)   Acute hypoxemic respiratory failure (HCC)   Ischemic cardiomyopathy   Aspiration pneumonia (HCC)   Pulmonary edema cardiac cause (HCC)   Acute kidney injury (HCC)   Shock liver   Cardiogenic shock (HCC)   Transaminitis   Protein-calorie malnutrition, severe   Pleural effusion    Discharge Instructions  Discharge Instructions     Diet - low sodium heart healthy   Complete by: As directed    Increase activity slowly   Complete by: As directed       Allergies as of 08/10/2020   No Known Allergies      Medication List     TAKE these medications    aspirin 81 MG chewable tablet Place 1 tablet (81 mg total) into feeding tube daily. Start taking on: August 11, 2020   carvedilol 3.125 MG tablet Commonly known as: COREG Place 1 tablet (3.125 mg total) into feeding tube 2 (two) times daily with a meal.   clonazePAM 0.5 MG disintegrating tablet Commonly known as: KLONOPIN Place 1 tablet (0.5 mg total) into feeding tube 2 (two) times daily as needed for seizure.   digoxin 0.125 MG tablet Commonly known as: LANOXIN Place 1 tablet (0.125 mg total) into feeding tube daily. Start taking on: August 11, 2020   famotidine 20 MG tablet Commonly known as: PEPCID Place 1 tablet (20 mg total) into feeding tube 2 (two) times daily.   feeding supplement (OSMOLITE 1.5 CAL) Liqd Place 1,000 mLs into feeding tube continuous.   feeding supplement (PROSource TF) liquid Place 45 mLs into feeding tube 2 (two) times daily.   free water Soln Place 100  mLs into feeding tube every 4 (four) hours.   guaiFENesin-dextromethorphan 100-10 MG/5ML syrup Commonly known as: ROBITUSSIN DM Place 10 mLs into feeding tube every 4 (four) hours as needed for  cough.   insulin aspart 100 UNIT/ML injection Commonly known as: novoLOG Inject 0-15 Units into the skin every 4 (four) hours.   insulin aspart 100 UNIT/ML injection Commonly known as: novoLOG Inject 3 Units into the skin every 4 (four) hours.   ipratropium-albuterol 0.5-2.5 (3) MG/3ML Soln Commonly known as: DUONEB Take 3 mLs by nebulization every 4 (four) hours as needed.   loperamide HCl 1 MG/7.5ML suspension Commonly known as: IMODIUM Place 7.5 mLs (1 mg total) into feeding tube as needed for diarrhea or loose stools.   nicotine 14 mg/24hr patch Commonly known as: NICODERM CQ - dosed in mg/24 hours Place 1 patch (14 mg total) onto the skin daily. Start taking on: August 11, 2020   oxyCODONE 5 MG immediate release tablet Commonly known as: Oxy IR/ROXICODONE Place 1 tablet (5 mg total) into feeding tube every 6 (six) hours as needed for severe pain.   rosuvastatin 40 MG tablet Commonly known as: CRESTOR Place 1 tablet (40 mg total) into feeding tube daily. Start taking on: August 11, 2020   spironolactone 25 MG tablet Commonly known as: Aldactone Take 0.5 tablets (12.5 mg total) by mouth daily.   ticagrelor 90 MG Tabs tablet Commonly known as: BRILINTA Place 1 tablet (90 mg total) into feeding tube 2 (two) times daily.         No Known Allergies  Consultations: PCCM Cardiology Neurology Palliative care   Procedures/Studies: CT HEAD WO CONTRAST  Result Date: 07/22/2020 CLINICAL DATA:  Anoxic brain damage EXAM: CT HEAD WITHOUT CONTRAST TECHNIQUE: Contiguous axial images were obtained from the base of the skull through the vertex without intravenous contrast. COMPARISON:  None. FINDINGS: Brain: Ventricle size and cerebral volume normal. Mild hypodensity in the frontal white matter bilaterally likely chronic. Negative for acute cortical infarct. Negative for acute hemorrhage or mass. No significant brain edema. Vascular: Negative for hyperdense vessel Skull:  Negative Sinuses/Orbits: Mucosal edema paranasal sinuses. Air-fluid level left sphenoid sinus. Patient is intubated and has NG tube in place. Other: None IMPRESSION: Negative for cerebral edema.  No evidence of anoxic brain injury Hypodensity in the deep white matter in the frontal lobes bilaterally likely due to chronic microvascular ischemia. Electronically Signed   By: Marlan Palau M.D.   On: 07/22/2020 16:05   MR BRAIN WO CONTRAST  Result Date: 08/07/2020 CLINICAL DATA:  Persistent altered mental status post cardiac arrest EXAM: MRI HEAD WITHOUT CONTRAST TECHNIQUE: Multiplanar, multiecho pulse sequences of the brain and surrounding structures were obtained without intravenous contrast. COMPARISON:  None. FINDINGS: Brain: Punctate focus of reduced diffusion in the left frontal lobe. Prominence of the ventricles and sulci reflects minor generalized parenchymal volume loss. Few small foci of T2 hyperintensity in the supratentorial white matter are nonspecific but may reflect minor chronic microvascular ischemic changes. There is a chronic small vessel infarct of the posterior left putamen. There is no evidence of intracranial hemorrhage. There is no intracranial mass or mass effect. There is no hydrocephalus or extra-axial fluid collection. Vascular: Major vessel flow voids at the skull base are preserved. Skull and upper cervical spine: Normal marrow signal is preserved. Sinuses/Orbits: Mild mucosal thickening.  Right lens replacement. Other: Sella is unremarkable.  Patchy mastoid fluid opacification. 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 No evidence of hypoxic/ischemic injury. Electronically Signed  By: Guadlupe Spanish M.D.   On: 08/07/2020 18:57   CARDIAC CATHETERIZATION  Result Date: 07/21/2020  Prox LAD to Mid LAD lesion is 100% stenosed.  Post intervention, there is a 0% residual stenosis.  A drug-eluting stent was successfully placed using a SYNERGY XD [id number].  1. Single  vessel occlusive CAD involving the proximal LAD 2. Successful PCI of the proximal to mid LAD with DES. Some distal embolization to the apical LAD 3. Normal right heart pressures mean PAP 20 mm Hg 4. Normal PCWP 12 mm Hg 5. Low cardiac output 2.7 L/min with index 1.73. 6. Low cardiac power 0.48. Plan: DAPT. Will load with Brilinta once NG in place. Continue IV Cangrelor until Brilinta load on board. Continue ASA and Brilinta for 12 months. Start IV milrinone. CCM to manage vent as well as place central line and arterial line. Will assess LV function with Echo.   DG Chest Port 1 View  Result Date: 08/02/2020 CLINICAL DATA:  65 year old male with respiratory failure. Hypoxia. STEMI. Found down. EXAM: PORTABLE CHEST 1 VIEW COMPARISON:  Portable chest 08/01/2020 and earlier. FINDINGS: Portable AP supine view at 0426 hours. Patient more rotated to the left today. Stable visible enteric feeding tube, looped once in the proximal stomach. Stable right IJ central line. Stable cardiac size and mediastinal contours. No pneumothorax. No pleural effusion is evident. Continued widespread increased interstitial opacity, with additional patchy and platelike opacity at the lung bases. Ventilation has improved since 07/31/2020, stable since yesterday. IMPRESSION: 1. Stable lines and tubes. 2. Stable ventilation from yesterday with widespread interstitial opacity, improved since 07/31/2020, and bibasilar atelectasis. Electronically Signed   By: Odessa Fleming M.D.   On: 08/02/2020 06:41   DG CHEST PORT 1 VIEW  Result Date: 08/01/2020 CLINICAL DATA:  Status post thoracentesis. EXAM: PORTABLE CHEST 1 VIEW COMPARISON:  Single-view of the chest earlier today. FINDINGS: Hazy opacity over the right chest is decreased after thoracentesis. No pneumothorax. Streaky basilar airspace opacities are again seen. Right IJ catheter is unchanged. The patient's feeding tube tip has advanced and is now below the inferior margin the film. IMPRESSION:  Decreased right effusion after thoracentesis. Negative for pneumothorax. Bibasilar airspace disease could be due to atelectasis or pneumonia. Feeding tube has advanced with the tip now below the inferior margin of the film. Electronically Signed   By: Drusilla Kanner M.D.   On: 08/01/2020 15:01   DG CHEST PORT 1 VIEW  Result Date: 08/01/2020 CLINICAL DATA:  Hypoxia. EXAM: PORTABLE CHEST 1 VIEW COMPARISON:  07/31/2020. FINDINGS: Interim extubation. Feeding tube noted with tip coiled in stomach. Right IJ line noted with tip over the upper right atrium. Heart size normal. Diffuse bilateral pulmonary infiltrates/edema, progressed from prior exam. Bibasilar atelectasis. Small right pleural effusion. No pneumothorax. IMPRESSION: 1. Interim extubation. Feeding tube noted with tip coiled in stomach. Right IJ line noted with tip over the upper right atrium. 2. Diffuse bilateral pulmonary infiltrates/edema, progressed from prior exam. Bibasilar atelectasis. Small right pleural effusion. Electronically Signed   By: Maisie Fus  Register   On: 08/01/2020 06:52   DG Chest Port 1 View  Result Date: 07/31/2020 CLINICAL DATA:  Intubation. EXAM: PORTABLE CHEST 1 VIEW COMPARISON:  07/26/2020. FINDINGS: Endotracheal tube, feeding tube, right IJ catheter in stable position. Heart size stable. Bibasilar pulmonary infiltrates/edema again noted. Slight improvement on the left. Persistent bibasilar atelectasis. Persistent mild to moderate right pleural effusion. No pneumothorax. IMPRESSION: 1.  Lines and tubes in stable position. 2. Persistent bibasilar infiltrates/edema again  noted. Slight improvement on the left. Persistent bibasilar atelectasis. Persistent mild to moderate right pleural effusion. Electronically Signed   By: Maisie Fus  Register   On: 07/31/2020 05:18   DG CHEST PORT 1 VIEW  Result Date: 07/26/2020 CLINICAL DATA:  Congestive heart failure. EXAM: PORTABLE CHEST 1 VIEW COMPARISON:  Jul 23, 2020. FINDINGS: The heart size  and mediastinal contours are within normal limits. Endotracheal and feeding tubes are in good position. Right internal jugular catheter is unchanged. No pneumothorax is noted. Stable bibasilar atelectasis or edema is noted with associated pleural effusions. The visualized skeletal structures are unremarkable. IMPRESSION: Stable support apparatus. Stable bibasilar opacities as described above. Electronically Signed   By: Lupita Raider M.D.   On: 07/26/2020 08:19   DG CHEST PORT 1 VIEW  Result Date: 07/23/2020 CLINICAL DATA:  Recent cardiac arrest EXAM: PORTABLE CHEST 1 VIEW COMPARISON:  07/21/2020 FINDINGS: Cardiac shadow is stable. Endotracheal tube, gastric catheter and right-sided jugular central line are again seen and stable. Small effusions are noted bilaterally. Hazy changes particularly in the right lung are again seen but somewhat improved when compare with the prior exam consistent with mild pulmonary edema. Left retrocardiac consolidation is again noted and stable. IMPRESSION: Slight improved aeration when compared with the prior exam. Tubes and lines as described. Electronically Signed   By: Alcide Clever M.D.   On: 07/23/2020 09:33   DG CHEST PORT 1 VIEW  Result Date: 07/21/2020 CLINICAL DATA:  Central line placement. EXAM: PORTABLE CHEST 1 VIEW COMPARISON:  Jul 21, 2020 FINDINGS: A right central line is been placed in the interval terminating in the central SVC. No pneumothorax. The ETT remains in good position terminating 2 cm above the carina. An NG tube terminates below today's film. Opacity in left retrocardiac region is stable. Haziness over the right hemithorax is stable to mildly worsened. Mild interstitial prominence on the left. IMPRESSION: 1. A new right central line is in good position.  No pneumothorax. 2. Hazy opacity diffusely on the right is mildly worse in the interval. I suspect a layering effusion with underlying opacity. Asymmetric edema considered less likely but possible.  3. Mild interstitial prominence on the left may represent pulmonary venous congestion. 4. Persistent opacity in left retrocardiac region. 5. Support apparatus as above. Electronically Signed   By: Gerome Sam III M.D   On: 07/21/2020 09:26   DG CHEST PORT 1 VIEW  Result Date: 07/21/2020 CLINICAL DATA:  65 year old male enteric tube placement. STEMI, found down. EXAM: PORTABLE CHEST 1 VIEW COMPARISON:  0111 hours today. FINDINGS: Portable AP semi upright view at 0456 hours. Endotracheal tube tip remains in good position between the clavicles and carina. Enteric tube courses to the left upper quadrant, side hole at the level of the gastric body. Stable cardiac size and mediastinal contours. No pneumothorax. New veiling opacity throughout the right lung, and increased retrocardiac opacity on the left obscuring the diaphragm. Paucity of bowel gas in the upper abdomen. Stable visualized osseous structures. IMPRESSION: 1. Enteric tube courses into the stomach, side hole at the level of the gastric body. 2. Endotracheal tube tip in good position. 3. Veiling opacity in the right lung now may reflect new pleural effusion or asymmetric edema. Interval left lower lobe collapse or consolidation. Electronically Signed   By: Odessa Fleming M.D.   On: 07/21/2020 05:12   DG Chest Port 1 View  Result Date: 07/21/2020 CLINICAL DATA:  STEMI. Found unresponsive by family. Pulseless and apneic. Initially in vfib,  total of 5 shocks, 3 epis,  amio and 15 minutes CPR. Pt agitated on arrival, given  versed IV. EXAM: PORTABLE CHEST 1 VIEW COMPARISON:  Chest x-ray 02/22/2006 FINDINGS: Interval placement of an endotracheal tube with tip terminating 4 cm above the carina. Cardiac paddles overlie the patient. Poorly nonvisualization of the aortic arch due to overlying pulmonary findings. Otherwise the heart size and mediastinal contours are within normal limits. Prominence of the hilar vasculature. Perihilar interstitial and  airspace opacities. Increased interstitial markings diffusely. Question of Kerley B lines within the left peripheral lower lobe. No pulmonary edema. No pleural effusion. No pneumothorax. No acute osseous abnormality. IMPRESSION: 1. Pulmonary edema. Superimposed infection/inflammation not excluded. 2. Endotracheal tube with tip 4 cm above the carina. Electronically Signed   By: Tish Frederickson M.D.   On: 07/21/2020 01:32   DG Abd Portable 1V  Result Date: 08/08/2020 CLINICAL DATA:  Feeding tube adjustment. EXAM: PORTABLE ABDOMEN - 1 VIEW COMPARISON:  None. FINDINGS: Tip of the weighted enteric tube in the right upper quadrant in the region of the distal stomach. No bowel dilatation to suggest obstruction. There is contrast in the colon from prior swallowing function. IMPRESSION: Tip of the weighted enteric tube in the right upper quadrant in the region of the distal stomach. Electronically Signed   By: Narda Rutherford M.D.   On: 08/08/2020 16:23   DG Abd Portable 1V  Result Date: 07/23/2020 CLINICAL DATA:  Check feeding catheter placement EXAM: PORTABLE ABDOMEN - 1 VIEW COMPARISON:  None. FINDINGS: Scattered large and small bowel gas is noted. Feeding catheter is noted within the distal stomach. IMPRESSION: Feeding catheter in the distal stomach. Electronically Signed   By: Alcide Clever M.D.   On: 07/23/2020 12:00   DG Swallowing Func-Speech Pathology  Result Date: 08/06/2020 Objective Swallowing Evaluation: Type of Study: MBS-Modified Barium Swallow Study  Patient Details Name: Costantino Kohlbeck MRN: [id number] Date of Birth: 07-08-1955 Today's Date: 08/06/2020 Time: SLP Start Time (ACUTE ONLY): 1303 -SLP Stop Time (ACUTE ONLY): 1325 SLP Time Calculation (min) (ACUTE ONLY): 22 min Past Medical History: Past Medical History: Diagnosis Date  Cardiac arrest (HCC)   Coronary artery disease   Dysrhythmia   Myocardial infarction [facility])  Past Surgical History: Past Surgical History: Procedure Laterality Date  CARDIAC  CATHETERIZATION    CORONARY/GRAFT ACUTE MI REVASCULARIZATION N/A 07/21/2020  Procedure: Coronary/Graft Acute MI Revascularization;  Surgeon: Swaziland, Peter M, MD;  Location: [facility] INVASIVE CV LAB;  Service: Cardiovascular;  Laterality: N/A;  LEFT HEART CATH AND CORONARY ANGIOGRAPHY N/A 07/21/2020  Procedure: LEFT HEART CATH AND CORONARY ANGIOGRAPHY;  Surgeon: Swaziland, Peter M, MD;  Location: [facility] INVASIVE CV LAB;  Service: Cardiovascular;  Laterality: N/A;  RIGHT HEART CATH N/A 07/21/2020  Procedure: RIGHT HEART CATH;  Surgeon: Swaziland, Peter M, MD;  Location: [facility] INVASIVE CV LAB;  Service: Cardiovascular;  Laterality: N/A; HPI: Pt is a 65 yo male found down by family VF arrest associated with STEMI, shocked 5x, 15 min CPR before ROSC achieved. ETT 5/21-5/31. CXR 5/31 with persistent bibasilar infiltrates/edema. PMH includes cardiac arrest, CAD, dysrhythmia, myocardial infarction. Per pt's son, pt's oral intake at home was limited to very soft and/or liquidised foods PTA due to condition of dentition.  Subjective: pt alert and cooperative, needing repetition of instructions Assessment / Plan / Recommendation CHL IP CLINICAL IMPRESSIONS 08/06/2020 Clinical Impression Pt has a severe oropharyngeal dysphagia that is likely multifactorial in the setting of decreased strength and sensation s/p intubation and overall deconditioning, further impacted  by reduced oral hygiene/presence of secretions and cognitive impairment. Video interpreter Linna Caprice [phone]) was used throughout this study. Pt has weak lingual manipulation with slow, repetitive movements used to work boluses back toward his pharynx but incompletely clearing his oral cavity. Pharyngeally he has reduced base of tongue retraction, hyolaryngeal movement, pharyngeal squeeze, and epiglottic inversion, leading to impaired airway closure and reduced pharyngeal clearance. Thin and nectar thick liquids are silently aspirated during the swallow, and all consistencies tested, including  purees, are silently aspirated after the swallow due to pharyngeal residue that also appears to mix with secretions. Yankauer was used to clear his oral cavity as much as possible. He cannot produce enough strength to clear residuals despite cues for multiple hard swallows or chin tuck. He also cannot produce a cough hard enough to clear any aspirates or anything that remained in his laryngeal vestibule. Recommend that he continue to remain NPO with strong focus on oral care. Given mentation and severity of deficits, pt's recovery may take a longer amount of time, and he therefore may benefit from longer-term alternative means of nutrition if nearing readiness for discharge otherwise. SLP Visit Diagnosis Dysphagia, oropharyngeal phase (R13.12) Attention and concentration deficit following -- Frontal lobe and executive function deficit following -- Impact on safety and function Severe aspiration risk;Risk for inadequate nutrition/hydration   CHL IP TREATMENT RECOMMENDATION 08/06/2020 Treatment Recommendations Therapy as outlined in treatment plan below   Prognosis 08/06/2020 Prognosis for Safe Diet Advancement Good Barriers to Reach Goals Cognitive deficits;Severity of deficits Barriers/Prognosis Comment -- [facility]L IP DIET RECOMMENDATION 08/06/2020 SLP Diet Recommendations NPO;Alternative means - temporary Liquid Administration via -- Medication Administration Via alternative means Compensations -- Postural Changes --   [facility]L IP OTHER RECOMMENDATIONS 08/06/2020 Recommended Consults -- Oral Care Recommendations Oral care QID Other Recommendations Have oral suction available   [facility]L IP FOLLOW UP RECOMMENDATIONS 08/06/2020 Follow up Recommendations Inpatient Rehab   CHL IP FREQUENCY AND DURATION 08/06/2020 Speech Therapy Frequency (ACUTE ONLY) min 2x/week Treatment Duration 2 weeks      CHL IP ORAL PHASE 08/06/2020 Oral Phase Impaired Oral - Pudding Teaspoon -- Oral - Pudding Cup -- Oral - Honey Teaspoon -- Oral - Honey Cup -- Oral - Nectar  Teaspoon Weak lingual manipulation;Reduced posterior propulsion;Lingual/palatal residue;Delayed oral transit;Decreased bolus cohesion Oral - Nectar Cup -- Oral - Nectar Straw -- Oral - Thin Teaspoon Weak lingual manipulation;Reduced posterior propulsion;Lingual/palatal residue;Delayed oral transit;Decreased bolus cohesion Oral - Thin Cup -- Oral - Thin Straw Weak lingual manipulation;Reduced posterior propulsion;Lingual/palatal residue;Delayed oral transit;Decreased bolus cohesion Oral - Puree Weak lingual manipulation;Reduced posterior propulsion;Lingual/palatal residue;Delayed oral transit;Decreased bolus cohesion Oral - Mech Soft -- Oral - Regular -- Oral - Multi-Consistency -- Oral - Pill -- Oral Phase - Comment --  CHL IP PHARYNGEAL PHASE 08/06/2020 Pharyngeal Phase Impaired Pharyngeal- Pudding Teaspoon -- Pharyngeal -- Pharyngeal- Pudding Cup -- Pharyngeal -- Pharyngeal- Honey Teaspoon -- Pharyngeal -- Pharyngeal- Honey Cup -- Pharyngeal -- Pharyngeal- Nectar Teaspoon Reduced pharyngeal peristalsis;Reduced epiglottic inversion;Reduced anterior laryngeal mobility;Reduced laryngeal elevation;Reduced airway/laryngeal closure;Reduced tongue base retraction;Pharyngeal residue - valleculae;Penetration/Aspiration during swallow;Penetration/Apiration after swallow Pharyngeal Material enters airway, passes BELOW cords without attempt by patient to eject out (silent aspiration) Pharyngeal- Nectar Cup -- Pharyngeal -- Pharyngeal- Nectar Straw -- Pharyngeal -- Pharyngeal- Thin Teaspoon Reduced pharyngeal peristalsis;Reduced epiglottic inversion;Reduced anterior laryngeal mobility;Reduced laryngeal elevation;Reduced airway/laryngeal closure;Reduced tongue base retraction;Pharyngeal residue - valleculae;Penetration/Aspiration during swallow;Penetration/Apiration after swallow Pharyngeal Material enters airway, passes BELOW cords without attempt by patient to eject out (silent aspiration) Pharyngeal- Thin Cup --  Pharyngeal --  Pharyngeal- Thin Straw Reduced pharyngeal peristalsis;Reduced epiglottic inversion;Reduced anterior laryngeal mobility;Reduced laryngeal elevation;Reduced airway/laryngeal closure;Reduced tongue base retraction;Pharyngeal residue - valleculae;Penetration/Aspiration during swallow;Penetration/Apiration after swallow Pharyngeal Material enters airway, passes BELOW cords without attempt by patient to eject out (silent aspiration) Pharyngeal- Puree Reduced pharyngeal peristalsis;Reduced epiglottic inversion;Reduced anterior laryngeal mobility;Reduced laryngeal elevation;Reduced airway/laryngeal closure;Reduced tongue base retraction;Pharyngeal residue - valleculae;Penetration/Apiration after swallow Pharyngeal Material enters airway, passes BELOW cords without attempt by patient to eject out (silent aspiration) Pharyngeal- Mechanical Soft -- Pharyngeal -- Pharyngeal- Regular -- Pharyngeal -- Pharyngeal- Multi-consistency -- Pharyngeal -- Pharyngeal- Pill -- Pharyngeal -- Pharyngeal Comment --  CHL IP CERVICAL ESOPHAGEAL PHASE 08/06/2020 Cervical Esophageal Phase WFL Pudding Teaspoon -- Pudding Cup -- Honey Teaspoon -- Honey Cup -- Nectar Teaspoon -- Nectar Cup -- Nectar Straw -- Thin Teaspoon -- Thin Cup -- Thin Straw -- Puree -- Mechanical Soft -- Regular -- Multi-consistency -- Pill -- Cervical Esophageal Comment -- Mahala Menghini., M.A. CCC-SLP Acute Rehabilitation Services Pager [phone] Office [phone] 08/06/2020, 4:05 PM              EEG adult  Result Date: 07/21/2020 Charlsie Quest, MD     07/21/2020  9:51 AM Patient Name: Curt Oatis MRN: [id number] Epilepsy Attending: Charlsie Quest Referring Physician/Provider: Dr Emelda Brothers Date: 07/21/2020 Duration: 25.23 mins Patient history: 65yo M s/p cardiac arrest. EEG to evaluate for seizure. Level of alertness: comatose AEDs during EEG study: Versed Technical aspects: This EEG study was done with scalp electrodes positioned according to the 10-20 International  system of electrode placement. Electrical activity was acquired at a sampling rate of 500Hz  and reviewed with a high frequency filter of 70Hz  and a low frequency filter of 1Hz . EEG data were recorded continuously and digitally stored. Description: EEG showed continuous generalized low amplitude 3 to 6 Hz theta-delta slowing. EEG was reactive to tactile stimulation. Hyperventilation and photic stimulation were not performed.   ABNORMALITY - Continuous slow, generalized IMPRESSION: This study is suggestive of severe diffuse encephalopathy, nonspecific etiology. No seizures or epileptiform discharges were seen throughout the recording. Priyanka Annabelle Harman   Overnight EEG with video  Result Date: 07/22/2020 Charlsie Quest, MD     07/23/2020  9:49 AM Patient Name: Sadler Teschner MRN: [id number] Epilepsy Attending: Charlsie Quest Referring Physician/Provider: Dr Emelda Brothers Duration: 07/21/2020 4010 to 07/22/2020 2725   Patient history: 65yo M s/p cardiac arrest. EEG to evaluate for seizure.   Level of alertness: comatose   AEDs during EEG study: Versed   Technical aspects: This EEG study was done with scalp electrodes positioned according to the 10-20 International system of electrode placement. Electrical activity was acquired at a sampling rate of 500Hz  and reviewed with a high frequency filter of 70Hz  and a low frequency filter of 1Hz . EEG data were recorded continuously and digitally stored.   Description: EEG showed continuous generalized low amplitude 3 to 6 Hz theta-delta slowing. EEG was reactive to tactile stimulation. Hyperventilation and photic stimulation were not performed.   Parts of study were difficult evaluate due to significant electrode artifact.   ABNORMALITY - Continuous slow, generalized   IMPRESSION: This study is suggestive of severe diffuse encephalopathy, nonspecific etiology. No seizures or epileptiform discharges were seen throughout the recording.   Charlsie Quest   ECHOCARDIOGRAM  COMPLETE  Result Date: 07/21/2020    ECHOCARDIOGRAM REPORT   Patient Name:   GLENDON DUNWOODY Date of Exam: 07/21/2020 Medical Rec #:  [id number]  Height:  64.0 in Accession #:    [id number] Weight:       113.3 lb Date of Birth:  [birth year]   BSA:          1.537 m Patient Age:    65 years   BP:           89/71 mmHg Patient Gender: M          HR:           85 bpm. Exam Location:  Inpatient Procedure: 2D Echo STAT ECHO Indications:    acute myocardial infarction  History:        Patient has no prior history of Echocardiogram examinations.                 Cardiac arrest; CAD.  Sonographer:    Delcie Roch Referring Phys: [id number] DALTON S MCLEAN IMPRESSIONS  1. Coarse apical trabeculation and calcified LV apical false tendon (normal variant) -no mural thrombus. Left ventricular ejection fraction, by estimation, is 30 to 35%. The left ventricle has moderately decreased function. The left ventricle demonstrates regional wall motion abnormalities (see scoring diagram/findings for description). Left ventricular diastolic parameters are consistent with Grade I diastolic dysfunction (impaired relaxation). There is severe hypokinesis of the left ventricular, entire anteroseptal wall, anterior wall, apical segment and inferoapical segment.  2. Right ventricular systolic function is normal. The right ventricular size is normal. There is normal pulmonary artery systolic pressure. The estimated right ventricular systolic pressure is 29.6 mmHg.  3. The pericardial effusion is posterior to the left ventricle.  4. The mitral valve is abnormal. Trivial mitral valve regurgitation.  5. The aortic valve is tricuspid. Aortic valve regurgitation is not visualized.  6. The inferior vena cava is normal in size with greater than 50% respiratory variability, suggesting right atrial pressure of 3 mmHg. Comparison(s): No prior Echocardiogram. Findings suggestive of LAD territory ischemia/infarct. Conclusion(s)/Recommendation(s): Critical  findings reported to Dr. Shirlee Latch and acknowledged at 07/21/2020 at 11:35 am. FINDINGS  Left Ventricle: Coarse apical trabeculation and calcified LV apical false tendon (normal variant) -no mural thrombus. Left ventricular ejection fraction, by estimation, is 30 to 35%. The left ventricle has moderately decreased function. The left ventricle demonstrates regional wall motion abnormalities. Severe hypokinesis of the left ventricular, entire anteroseptal wall, anterior wall, apical segment and inferoapical segment. The left ventricular internal cavity size was normal in size. There is no left ventricular hypertrophy. Left ventricular diastolic parameters are consistent with Grade I diastolic dysfunction (impaired relaxation). Normal left ventricular filling pressure. Right Ventricle: The right ventricular size is normal. No increase in right ventricular wall thickness. Right ventricular systolic function is normal. There is normal pulmonary artery systolic pressure. The tricuspid regurgitant velocity is 2.58 m/s, and  with an assumed right atrial pressure of 3 mmHg, the estimated right ventricular systolic pressure is 29.6 mmHg. Left Atrium: Left atrial size was normal in size. Right Atrium: Right atrial size was normal in size. Pericardium: Trivial pericardial effusion is present. The pericardial effusion is posterior to the left ventricle. Mitral Valve: The mitral valve is abnormal. There is mild thickening of the mitral valve leaflet(s). Trivial mitral valve regurgitation. Tricuspid Valve: The tricuspid valve is grossly normal. Tricuspid valve regurgitation is mild. Aortic Valve: The aortic valve is tricuspid. Aortic valve regurgitation is not visualized. Pulmonic Valve: The pulmonic valve was normal in structure. Pulmonic valve regurgitation is mild. Aorta: The aortic root and ascending aorta are structurally normal, with no evidence of dilitation. Venous: The inferior vena cava  is normal in size with greater than  50% respiratory variability, suggesting right atrial pressure of 3 mmHg. IAS/Shunts: No atrial level shunt detected by color flow Doppler.  LEFT VENTRICLE PLAX 2D LVIDd:         3.90 cm Diastology LVIDs:         3.10 cm LV e' medial:    5.55 cm/s LV PW:         0.90 cm LV E/e' medial:  7.6 LV IVS:        0.90 cm LV e' lateral:   7.62 cm/s                        LV E/e' lateral: 5.5  RIGHT VENTRICLE             IVC RV S prime:     15.30 cm/s  IVC diam: 1.40 cm TAPSE (M-mode): 1.8 cm LEFT ATRIUM             Index       RIGHT ATRIUM          Index LA diam:        2.50 cm 1.63 cm/m  RA Area:     8.62 cm LA Vol (A2C):   24.0 ml 15.62 ml/m RA Volume:   16.80 ml 10.93 ml/m LA Vol (A4C):   16.1 ml 10.48 ml/m LA Biplane Vol: 21.3 ml 13.86 ml/m  AORTIC VALVE LVOT Vmax:   113.00 cm/s LVOT Vmean:  67.300 cm/s LVOT VTI:    0.128 m  AORTA Ao Asc diam: 3.20 cm MITRAL VALVE               TRICUSPID VALVE MV Area (PHT): 4.06 cm    TR Peak grad:   26.6 mmHg MV Decel Time: 187 msec    TR Vmax:        258.00 cm/s MV E velocity: 42.20 cm/s MV A velocity: 71.00 cm/s  SHUNTS MV E/A ratio:  0.59        Systemic VTI: 0.13 m Zoila Shutter MD Electronically signed by Zoila Shutter MD Signature Date/Time: 07/21/2020/11:38:46 AM    Final    US Abdomen Limited RUQ (LIVER/GB)  Result Date: 07/25/2020 CLINICAL DATA:  Transaminitis. EXAM: ULTRASOUND ABDOMEN LIMITED RIGHT UPPER QUADRANT COMPARISON:  None. FINDINGS: Gallbladder: Physiologically distended. No intraluminal gallstones or sludge. Borderline wall thickness of 3-4 mm. No sonographic Murphy sign noted by sonographer. Common bile duct: Diameter: 4 mm. Liver: No focal lesion identified. Borderline increased in parenchymal echogenicity. Portal vein is patent on color Doppler imaging with normal direction of blood flow towards the liver. Other: Right pleural effusion.  No right upper quadrant ascites. IMPRESSION: 1. Borderline increased hepatic parenchymal echogenicity which may  represent mild steatosis or other intrinsic hepatocellular disease. 2. Borderline gallbladder wall thickness of 3-4 mm, nonspecific. No gallstones or additional findings of acute cholecystitis. No biliary dilatation. 3. Right pleural effusion. Electronically Signed   By: Narda Rutherford M.D.   On: 07/25/2020 23:51     Subjective: Patient seen and examined at bedside, sleeping but easily arousable.  Aided with video interpreter Habiba [phone].  Patient complaining of dry mouth, otherwise no other concerns or complaints at this time.  No family present bedside this morning.  Patient denies any pain.  Patient will be discharging to [facility] inpatient rehabilitation today for further care.  No acute concerns overnight per nursing staff.  Discharge Exam: Vitals:   08/10/20 0454 08/10/20 0981  BP:  99/68  Pulse: 89 87  Resp: 19 16  Temp: 98.3 F (36.8 C) (!) 97.5 F (36.4 C)  SpO2: 97% 98%   Vitals:   08/09/20 2111 08/10/20 0325 08/10/20 0334 08/10/20 0737  BP: 96/68   99/68  Pulse: 86 89  87  Resp: 18 19  16   Temp: 97.9 F (36.6 C) 98.3 F (36.8 C)  (!) 97.5 F (36.4 C)  TempSrc: Oral Axillary  Axillary  SpO2:  97%  98%  Weight:   45.3 kg   Height:        General: Pt is alert, awake, not in acute distress, thin/cachectic in appearance Cardiovascular: RRR, S1/S2 +, no rubs, no gallops Respiratory: CTA bilaterally, no wheezing, no rhonchi, on room air Abdominal: Soft, NT, ND, bowel sounds +, core track in place Extremities: no edema, no cyanosis    The results of significant diagnostics from this hospitalization (including imaging, microbiology, ancillary and laboratory) are listed below for reference.     Microbiology: Recent Results (from the past 240 hour(s))  Body fluid culture w Gram Stain     Status: None   Collection Time: 08/01/20  2:40 PM   Specimen: Fluid  Result Value Ref Range Status   Specimen Description FLUID PLEURAL  Final   Special Requests NONE  Final   Gram  Stain   Final    FEW WBC PRESENT,BOTH PMN AND MONONUCLEAR NO ORGANISMS SEEN    Culture   Final    NO GROWTH 3 DAYS Performed at [facility], [address] [place], [place] [address]    Report Status 08/05/2020 FINAL  Final     Labs: BNP (last 3 results) No results for input(s): BNP in the last 8760 hours. Basic Metabolic Panel: Recent Labs  Lab 08/06/20 0422 08/07/20 0403 08/08/20 0457 08/09/20 0333 08/10/20 0428  NA 143 140 139 141 138  K 3.8 4.2 4.5 4.2 4.3  CL 117* 110 108 107 104  CO2 23 23 20* 24 26  GLUCOSE 222* 125* 76 142* 125*  BUN 21 24* 23 30* 24*  CREATININE 0.68 0.60* 0.75 0.84 0.59*  CALCIUM 8.0* 8.3* 8.2* 8.1* 8.3*  MG  --   --  2.2  --   --    Liver Function Tests: No results for input(s): AST, ALT, ALKPHOS, BILITOT, PROT, ALBUMIN in the last 168 hours. No results for input(s): LIPASE, AMYLASE in the last 168 hours. No results for input(s): AMMONIA in the last 168 hours. CBC: Recent Labs  Lab 08/04/20 0420 08/05/20 0304 08/06/20 2044 08/08/20 0457 08/10/20 0428  WBC 15.1* 14.1* 11.0* 12.2* 9.1  NEUTROABS  --   --  8.2*  --   --   HGB 9.9* 10.0* 10.5* 11.7* 11.0*  HCT 31.2* 31.5* 33.4* 38.3* 34.5*  MCV 94.5 94.9 94.6 97.5 94.0  PLT 435* 406* 348 359 354   Cardiac Enzymes: No results for input(s): CKTOTAL, CKMB, CKMBINDEX, TROPONINI in the last 168 hours. BNP: Invalid input(s): POCBNP CBG: Recent Labs  Lab 08/09/20 1633 08/09/20 2042 08/09/20 2326 08/10/20 0329 08/10/20 0734  GLUCAP 129* 119* 160* 102* 156*   D-Dimer No results for input(s): DDIMER in the last 72 hours. Hgb A1c No results for input(s): HGBA1C in the last 72 hours. Lipid Profile No results for input(s): CHOL, HDL, LDLCALC, TRIG, CHOLHDL, LDLDIRECT in the last 72 hours. Thyroid function studies No results for input(s): TSH, T4TOTAL, T3FREE, THYROIDAB in the last 72 hours.  Invalid input(s):  FREET3 Anemia work up No results for input(s): VITAMINB12,  FOLATE, FERRITIN, TIBC, IRON, RETICCTPCT in the last 72 hours. Urinalysis No results found for: COLORURINE, APPEARANCEUR, LABSPEC, PHURINE, GLUCOSEU, HGBUR, BILIRUBINUR, KETONESUR, PROTEINUR, UROBILINOGEN, NITRITE, LEUKOCYTESUR Sepsis Labs Invalid input(s): PROCALCITONIN,  WBC,  LACTICIDVEN Microbiology Recent Results (from the past 240 hour(s))  Body fluid culture w Gram Stain     Status: None   Collection Time: 08/01/20  2:40 PM   Specimen: Fluid  Result Value Ref Range Status   Specimen Description FLUID PLEURAL  Final   Special Requests NONE  Final   Gram Stain   Final    FEW WBC PRESENT,BOTH PMN AND MONONUCLEAR NO ORGANISMS SEEN    Culture   Final    NO GROWTH 3 DAYS Performed at [facility], [address], [place], [place] [address]    Report Status 08/05/2020 FINAL  Final     Time coordinating discharge: Over 30 minutes  SIGNED:   Alvira Philips [place], DO  [facility] 08/10/2020, 11:07 AM

## 2020-08-10 NOTE — Progress Notes (Signed)
PT Cancellation Note  Patient Details Name: Presley Summerlin MRN: [id number] DOB: 1955-08-03   Cancelled Treatment:    Reason Eval/Treat Not Completed: Patient preparing for d/c to CIR today; will defer treatment.  Ina Homes, PT, DPT Acute Rehabilitation Services  Pager [phone] Office [phone]  Malachy Chamber 08/10/2020, 11:35 AM

## 2020-08-10 NOTE — Progress Notes (Signed)
Inpatient Rehabilitation Medication Review by a Pharmacist  A complete drug regimen review was completed for this patient to identify any potential clinically significant medication issues.  Clinically significant medication issues were identified:  no  Check AMION for pharmacist assigned to patient if future medication questions/issues arise during this admission.  Pharmacist comments:   Time spent performing this drug regimen review (minutes):  15   Dashay Giesler 08/10/2020 5:40 PM

## 2020-08-10 NOTE — Progress Notes (Signed)
CARDIAC REHAB PHASE I   Pt has bed on CIR today. Not appropriate for CRP I at this time. Order placed for CRP II [place] to meet the requirements.  Reynold Bowen, RN BSN 08/10/2020 11:23 AM

## 2020-08-10 NOTE — Progress Notes (Addendum)
Patient ID: Vincent Horton, male   DOB: [birth year], 65 y.o.   MRN: [id number] P    Advanced Heart Failure Rounding Note  PCP-Cardiologist: None   Subjective:    MBS completed 6/6. + Severe oropharyngeal dysphagia. Remains high aspiration risk. T  Neuro saw 6/9 Carotids ordered.   Objective:   Weight Range: 45.3 kg Body mass index is 17.14 kg/m.   Vital Signs:   Temp:  [97.5 F (36.4 C)-98.3 F (36.8 C)] 97.5 F (36.4 C) (06/10 0737) Pulse Rate:  [86-89] 87 (06/10 0737) Resp:  [16-19] 16 (06/10 0737) BP: (93-99)/(68-69) 99/68 (06/10 0737) SpO2:  [97 %-98 %] 98 % (06/10 0737) Weight:  [45.3 kg] 45.3 kg (06/10 0334) Last BM Date: 08/08/20  Weight change: Filed Weights   08/08/20 0635 08/09/20 0520 08/10/20 0334  Weight: 46.6 kg 45.3 kg 45.3 kg    Intake/Output:   Intake/Output Summary (Last 24 hours) at 08/10/2020 0933 Last data filed at 08/10/2020 0600 Gross per 24 hour  Intake 4340 ml  Output 1300 ml  Net 3040 ml      Physical Exam   PHYSICAL EXAM: General:  Well appearing. No resp difficulty HEENT: normal + Cortrak  Neck: supple. no JVD. Carotids 2+ bilat; no bruits. No lymphadenopathy or thryomegaly appreciated. Cor: PMI nondisplaced. Regular rate & rhythm. No rubs, gallops or murmurs. Lungs: clear Abdomen: soft, nontender, nondistended. No hepatosplenomegaly. No bruits or masses. Good bowel sounds. Extremities: no cyanosis, clubbing, rash, edema Neuro: alert & orientedx3, cranial nerves grossly intact. moves all 4 extremities w/o difficulty. Affect pleasant    Telemetry  NSR 80s   Labs    CBC Recent Labs    08/08/20 0457 08/10/20 0428  WBC 12.2* 9.1  HGB 11.7* 11.0*  HCT 38.3* 34.5*  MCV 97.5 94.0  PLT 359 354   Basic Metabolic Panel Recent Labs    52/77/82 0457 08/09/20 0333 08/10/20 0428  NA 139 141 138  K 4.5 4.2 4.3  CL 108 107 104  CO2 20* 24 26  GLUCOSE 76 142* 125*  BUN 23 30* 24*  CREATININE 0.75 0.84 0.59*  CALCIUM 8.2* 8.1*  8.3*  MG 2.2  --   --    Liver Function Tests No results for input(s): AST, ALT, ALKPHOS, BILITOT, PROT, ALBUMIN in the last 72 hours. No results for input(s): LIPASE, AMYLASE in the last 72 hours. Cardiac Enzymes No results for input(s): CKTOTAL, CKMB, CKMBINDEX, TROPONINI in the last 72 hours.  BNP: BNP (last 3 results) No results for input(s): BNP in the last 8760 hours.  ProBNP (last 3 results) No results for input(s): PROBNP in the last 8760 hours.   D-Dimer No results for input(s): DDIMER in the last 72 hours. Hemoglobin A1C No results for input(s): HGBA1C in the last 72 hours. Fasting Lipid Panel No results for input(s): CHOL, HDL, LDLCALC, TRIG, CHOLHDL, LDLDIRECT in the last 72 hours. Thyroid Function Tests No results for input(s): TSH, T4TOTAL, T3FREE, THYROIDAB in the last 72 hours.  Invalid input(s): FREET3  Other results:   Imaging    No results found.    Medications:     Scheduled Medications:  aspirin  81 mg Per Tube Daily   carvedilol  3.125 mg Per Tube BID WC   chlorhexidine  15 mL Mouth Rinse BID   clonazepam  0.5 mg Per Tube TID   digoxin  0.125 mg Per Tube Daily   enoxaparin (LOVENOX) injection  30 mg Subcutaneous Daily   famotidine  20  mg Per Tube BID   feeding supplement (PROSource TF)  45 mL Per Tube BID   free water  100 mL Per Tube Q4H   insulin aspart  0-15 Units Subcutaneous Q4H   insulin aspart  3 Units Subcutaneous Q4H   mouth rinse  15 mL Mouth Rinse q12n4p   nicotine  14 mg Transdermal Daily   oxyCODONE  5 mg Per Tube Q8H   rosuvastatin  40 mg Per Tube Daily   ticagrelor  90 mg Per Tube BID    Infusions:  sodium chloride Stopped (08/06/20 0521)   feeding supplement (OSMOLITE 1.5 CAL) 45 mL/hr at 08/10/20 0600    PRN Medications: acetaminophen (TYLENOL) oral liquid 160 mg/5 mL, guaiFENesin-dextromethorphan, haloperidol lactate, ipratropium-albuterol, loperamide HCl, ondansetron (ZOFRAN) IV    Assessment/Plan   1.  CAD: Anterior STEMI with occluded proximal LAD.  Treated with DES but complicated by embolization to distal LAD (procedure completed with occluded distal LAD).   - NO pain.  - Continue ticagrelor and ASA 81.  - Crestor 40 daily. 2. Cardiogenic shock: Ischemic cardiomyopathy. Echo with EF 30-35%, no LV thrombus, LAD territory WMAs, RV normal, IVC normal.  -Volume status stable.  - Continue digoxin 0.125. dig level ok 6/7  - Continue Coreg 3.125 mg bid.  - Cannot get Farxiga down Cortrack.  3. Acute hypoxemic respiratory failure: Now extubated.  Suspect HCAP. WBCs continues to come down. Finished Zosyn 5/29, started vancomycin/ceftazidime 5/31, completed 7 days total.  Cultures NGTD.  - Resolved. 4. Cardiac arrest: VF arrest associated with STEMI. Patient shocked x 5, 15 min CPR before ROSC.  No further VT.  Now rewarmed s/p hypothermia protocol.  He has been revascularized.  5. Anemia: Transfused 1 U on 5/27, Hgb 11  6. Paroxysmal Atrial fibrillation: We have not seen any actual atrial fibrillation, has had ST with PACs.   - Maintaining  NSR.  - Lovenox prophylaxis - Now off amio. C/w Coreg   7. Rt Pleural Effusion: s/p thoracentesis 6/1 w/ 450 cc fluid removal. F/u CXR 6/2 w/o effusion.  8. Delirium: improving. C/w bedside sitting  9. Hypernatremia: Na 138 today Per primary team.   10. Dysphagia - s/p MBS 6/6. Severe oropharyngeal dysphagia  Remains high aspiration risk.  - To continue NPO per SLP.  - Has Cortrak for TFs   - Will need PEG pending family consent. Palliative Care team following to discuss GOC  11. Debility: PT recommending SNF but cannot secure placement w/ cortrak in place. ? CIR  12. CVA: MRI 6/7 with punctate acute to subacute frontal infarct. Suspect this does not explain his delirium.  We have not seen definite atrial fibrillation and he is on aspirin + ticagrelor right now.   - Neurology consulted 6/9. Carotids ordered.  Check ECHO.  - ?LINQ monitor prior to  discharge.   Tonye Becket, NP-C  08/10/2020 9:33 AM  Patient seen with NP, agree with the above note.   Seen by neurology, will need carotids and echo with bubble study for small CVA.  They did not recommend LINQ monitor, would arrange for 2 week Zio patch at discharge.   Stable from cardiac standpoint, not volume overloaded.  BP still soft.  Will add low dose spironolactone 12.5 daily.  Marca Ancona 08/10/2020 10:31 AM

## 2020-08-10 NOTE — H&P (Signed)
Physical Medicine and Rehabilitation Admission H&P        Chief Complaint  Patient presents with   Cardiac Arrest  : HPI: Vincent Horton is a 65 year old right-handed non-English-speaking [name] male with unknown medical history on no prescription medications.  Per chart review patient lives with her 3 daughters.  Independent prior to admission.  1 level home 3 steps to entry.  Presented 07/21/2020 after being found unresponsive by family.  EMS arrival patient pulseless and apneic.  Initially in V. fib total of 5 shocks, 3 epis, 450 mg amiodarone 15 minutes of CPR.  Patient agitated and hypotensive upon arrival to the ED received Versed.  Admission chemistry glucose 275, creatinine 1.40, AST 328, ALT 251, hemoglobin A1c 6.5, troponin 2747, WBC 13,200, lactic acid 3.7.  EKG showing ST segment changes in anterior lateral leads and underwent emergent cardiac catheterization requiring LAD stenting.  Echocardiogram ejection fraction of 30 to 35% left ventricle moderately decreased function.  Cranial CT scan negative for cerebral edema.  No evidence of anoxic brain injury.  Hypodensity in the deep white matter in the frontal lobes bilaterally likely due to chronic microvascular ischemia.  She did require vasopressors for hypotension.  Hospital course respiratory failure intubation treated for right side aspiration pneumonia as well as patient developed right pleural effusion undergoing thoracentesis 08/01/2020 with 450 cc fluid removal.  Follow-up chest x-ray 08/02/2020 without effusion...  EEG negative for seizure but did suggest severe diffuse encephalopathy.  Patient remains on low-dose aspirin therapy per cardiology services as well as Brilinta.  Subcutaneous Lovenox for DVT prophylaxis.  Currently n.p.o. with alternative means of nutritional support.  MRI of the brain completed 08/07/2020 due to persistent altered mental status changes post cardiac arrest showing punctate acute to subacute left frontal infarct.   No evidence of hypoxic ischemic injury.  Patient remained on low-dose aspirin/Brilinta therapy for both CVA prophylaxis as well as cardiac.  Palliative care consulted to establish goals of care.  Therapy evaluations completed due to patient's altered mental status cardiac arrest was admitted for a comprehensive rehab program.   Review of Systems  Unable to perform ROS: Acuity of condition      Past Medical History:  Diagnosis Date   Cardiac arrest [facility])     Coronary artery disease     Dysrhythmia     Myocardial infarction [facility])           Past Surgical History:  Procedure Laterality Date   CARDIAC CATHETERIZATION       CORONARY/GRAFT ACUTE MI REVASCULARIZATION N/A 07/21/2020    Procedure: Coronary/Graft Acute MI Revascularization;  Surgeon: Swaziland, Peter M, MD;  Location: [facility] INVASIVE CV LAB;  Service: Cardiovascular;  Laterality: N/A;   LEFT HEART CATH AND CORONARY ANGIOGRAPHY N/A 07/21/2020    Procedure: LEFT HEART CATH AND CORONARY ANGIOGRAPHY;  Surgeon: Swaziland, Peter M, MD;  Location: [facility] INVASIVE CV LAB;  Service: Cardiovascular;  Laterality: N/A;   RIGHT HEART CATH N/A 07/21/2020    Procedure: RIGHT HEART CATH;  Surgeon: Swaziland, Peter M, MD;  Location: [facility] INVASIVE CV LAB;  Service: Cardiovascular;  Laterality: N/A;    History reviewed. No pertinent family history. Social History:  reports that he has been smoking. He has never used smokeless tobacco. He reports previous alcohol use. He reports previous drug use. Allergies: No Known Allergies No medications prior to admission.      Drug Regimen Review Drug regimen was reviewed and remains appropriate with no significant issues identified  Home: Home Living Family/patient expects to be discharged to:: Private residence Living Arrangements: Children (3 daughters) Available Help at Discharge: Family, Available 24 hours/day Type of Home: House Home Access:  (unsure) Entrance Stairs-Number of Steps: 3 Home Layout: One  level Bathroom Shower/Tub: Engineer, manufacturing systems: Standard Home Equipment: None Additional Comments: PT history obtained from conversation with OT (based on phone call with pt's family), pt with difficulty verbalizing during session.  Lives With: Family   Functional History: Prior Function Level of Independence: Independent Comments: does not work, but drives, walks without DME and enjoys community mobility, also enjoys fixing things like watches   Functional Status:  Mobility: Bed Mobility Overal bed mobility: Needs Assistance Bed Mobility: Supine to Sit Supine to sit: Supervision, HOB elevated, Min assist Sit to supine: Total assist General bed mobility comments: Initial supervision to come to sitting EOB; pt then reaching back for phone charger with posterior LOB, requiring up to minA for repeated partial sidelying<>sit suspect related to increased fatigue Transfers Overall transfer level: Needs assistance Equipment used: None, Rolling walker (2 wheeled) Transfers: Sit to/from Stand Sit to Stand: Mod assist Stand pivot transfers: Mod assist General transfer comment: Multiple sit<>stands from EOB, recliner and chair without armrests, no DME, pt requiring consistent modA for stability upon standing, reliant on UE support; additional trial with RW, minA for stability and poor safety awareness with RW management Ambulation/Gait Ambulation/Gait assistance: Mod assist, Max assist, Min assist Gait Distance (Feet): 14 Feet (+28) Assistive device: None, Rolling walker (2 wheeled) Gait Pattern/deviations: Step-through pattern, Decreased stride length, Shuffle, Trunk flexed, Narrow base of support, Staggering right, Staggering left General Gait Details: Slow, unsteady gait without DME, pt insistent on use of RW and reaching far outside of BOS to get despite verbal cues to try without, mod-maxA for stability; additional gait training with RW, pt with poor safety awareness and RW  management, requiring consistent minA for balance, up to mod-maxA for stability and RW management; at one point attempting to turn and sit on RW bar; 1x seated rest break for fatigue and safety Gait velocity: Decreased   ADL: ADL Overall ADL's : Needs assistance/impaired Eating/Feeding: NPO Eating/Feeding Details (indicate cue type and reason): Pt on feeding tube Grooming: Moderate assistance, Sitting Grooming Details (indicate cue type and reason): able to bring hand to face, fatigues quickly Upper Body Bathing: Moderate assistance Lower Body Bathing: Maximal assistance Upper Body Dressing : Moderate assistance Lower Body Dressing: Maximal assistance Toilet Transfer: Moderate assistance Toilet Transfer Details (indicate cue type and reason): Pt simulated stand pivot transfer to recliner, requiring mod assist to stand and take steps to recliner. Toileting- Clothing Manipulation and Hygiene: Maximal assistance Functional mobility during ADLs: Moderate assistance General ADL Comments: Pt requiring mod verbal and tactile cues for hand placement with RW as well as with RW placement during transfer.   Cognition: Cognition Overall Cognitive Status: Impaired/Different from baseline Arousal/Alertness: Awake/alert Orientation Level: Oriented to person, Oriented to place Attention: Sustained Sustained Attention: Impaired Sustained Attention Impairment: Verbal basic Memory: Impaired Memory Impairment: Decreased recall of new information Awareness: Impaired Awareness Impairment: Intellectual impairment, Emergent impairment Behaviors: Impulsive Safety/Judgment: Impaired Cognition Arousal/Alertness: Awake/alert Behavior During Therapy: Flat affect Overall Cognitive Status: Impaired/Different from baseline Area of Impairment: Attention, Following commands, Safety/judgement, Awareness, Problem solving Current Attention Level: Sustained, Selective Memory: Decreased short-term memory Following  Commands: Follows one step commands with increased time Safety/Judgement: Decreased awareness of safety, Decreased awareness of deficits Awareness: Intellectual, Emergent Problem Solving: Difficulty sequencing, Requires verbal cues, Requires  tactile cues General Comments: Minimal audible verbal communication beyond head nods and quietlyspeaking a few English words; following majority of simple commands in [place]. Still distracted by lines, but more easily redirected. Decreased safety awareness (attempting to turn and sit on RW bar) requiring frequent verbal cues for safety and sequencing. Still with increased flat affect; no family present this session Difficult to assess due to: Impaired communication   Physical Exam: Blood pressure 96/68, pulse 89, temperature 98.3 F (36.8 C), temperature source Axillary, resp. rate 19, height 5\' 4"  (1.626 m), weight 45.3 kg, SpO2 97 %. Physical Exam Constitutional:      General: He is not in acute distress.    Appearance: He is ill-appearing. HENT:    Head: Normocephalic.    Right Ear: External ear normal.    Left Ear: External ear normal.    Nose:    Comments: NGT    Mouth/Throat:    Mouth: Mucous membranes are moist. Eyes:    Extraocular Movements: Extraocular movements intact.    Pupils: Pupils are equal, round, and reactive to light. Cardiovascular:    Rate and Rhythm: Normal rate and regular rhythm.    Heart sounds: No murmur heard.   No gallop. Pulmonary:    Effort: Pulmonary effort is normal. No respiratory distress.    Breath sounds: No wheezing. Abdominal:    General: Abdomen is flat. Bowel sounds are normal. There is no distension. Musculoskeletal:        General: No swelling. Normal range of motion.    Cervical back: Normal range of motion. Skin:    General: Skin is warm. Neurological:    Mental Status: He is alert.    Comments: Patient is alert.  With assistance of video interpreter he does follow simple commands.  Provides  year in person but decreased awareness and insight as a whole  Patient is dysphonic. Moves all 4 limbs. Senses pain.   Psychiatric:    Comments: Flat but cooperates      Lab Results Last 48 Hours        Results for orders placed or performed during the hospital encounter of 07/21/20 (from the past 48 hour(s))  Glucose, capillary     Status: None    Collection Time: 08/08/20  8:08 AM  Result Value Ref Range    Glucose-Capillary 88 70 - 99 mg/dL      Comment: Glucose reference range applies only to samples taken after fasting for at least 8 hours.  Glucose, capillary     Status: None    Collection Time: 08/08/20 12:06 PM  Result Value Ref Range    Glucose-Capillary 99 70 - 99 mg/dL      Comment: Glucose reference range applies only to samples taken after fasting for at least 8 hours.  Glucose, capillary     Status: Abnormal    Collection Time: 08/08/20  7:35 PM  Result Value Ref Range    Glucose-Capillary 157 (H) 70 - 99 mg/dL      Comment: Glucose reference range applies only to samples taken after fasting for at least 8 hours.  Glucose, capillary     Status: Abnormal    Collection Time: 08/09/20 12:15 AM  Result Value Ref Range    Glucose-Capillary 201 (H) 70 - 99 mg/dL      Comment: Glucose reference range applies only to samples taken after fasting for at least 8 hours.  Basic metabolic panel     Status: Abnormal    Collection Time: 08/09/20  3:33 AM  Result Value Ref Range    Sodium 141 135 - 145 mmol/L    Potassium 4.2 3.5 - 5.1 mmol/L    Chloride 107 98 - 111 mmol/L    CO2 24 22 - 32 mmol/L    Glucose, Bld 142 (H) 70 - 99 mg/dL      Comment: Glucose reference range applies only to samples taken after fasting for at least 8 hours.    BUN 30 (H) 8 - 23 mg/dL    Creatinine, Ser 7.820.84 0.61 - 1.24 mg/dL    Calcium 8.1 (L) 8.9 - 10.3 mg/dL    GFR, Estimated >95>60 >62>60 mL/min      Comment: (NOTE) Calculated using the CKD-EPI Creatinine Equation (2021)      Anion gap 10 5 - 15       Comment: Performed at [facility], [address] [place], [place] [address]  Glucose, capillary     Status: Abnormal    Collection Time: 08/09/20  7:04 AM  Result Value Ref Range    Glucose-Capillary 224 (H) 70 - 99 mg/dL      Comment: Glucose reference range applies only to samples taken after fasting for at least 8 hours.  Glucose, capillary     Status: Abnormal    Collection Time: 08/09/20  7:58 AM  Result Value Ref Range    Glucose-Capillary 240 (H) 70 - 99 mg/dL      Comment: Glucose reference range applies only to samples taken after fasting for at least 8 hours.  Glucose, capillary     Status: Abnormal    Collection Time: 08/09/20 11:48 AM  Result Value Ref Range    Glucose-Capillary 176 (H) 70 - 99 mg/dL      Comment: Glucose reference range applies only to samples taken after fasting for at least 8 hours.  Glucose, capillary     Status: Abnormal    Collection Time: 08/09/20  4:33 PM  Result Value Ref Range    Glucose-Capillary 129 (H) 70 - 99 mg/dL      Comment: Glucose reference range applies only to samples taken after fasting for at least 8 hours.  Glucose, capillary     Status: Abnormal    Collection Time: 08/09/20  8:42 PM  Result Value Ref Range    Glucose-Capillary 119 (H) 70 - 99 mg/dL      Comment: Glucose reference range applies only to samples taken after fasting for at least 8 hours.  Glucose, capillary     Status: Abnormal    Collection Time: 08/09/20 11:26 PM  Result Value Ref Range    Glucose-Capillary 160 (H) 70 - 99 mg/dL      Comment: Glucose reference range applies only to samples taken after fasting for at least 8 hours.  Glucose, capillary     Status: Abnormal    Collection Time: 08/10/20  3:29 AM  Result Value Ref Range    Glucose-Capillary 102 (H) 70 - 99 mg/dL      Comment: Glucose reference range applies only to samples taken after fasting for at least 8 hours.  CBC     Status: Abnormal    Collection Time: 08/10/20  4:28 AM  Result  Value Ref Range    WBC 9.1 4.0 - 10.5 K/uL    RBC 3.67 (L) 4.22 - 5.81 MIL/uL    Hemoglobin 11.0 (L) 13.0 - 17.0 g/dL    HCT 57.834.5 (L) 46.939.0 - 52.0 %    MCV  94.0 80.0 - 100.0 fL    MCH 30.0 26.0 - 34.0 pg    MCHC 31.9 30.0 - 36.0 g/dL    RDW 19.3 (H) 79.0 - 15.5 %    Platelets 354 150 - 400 K/uL    nRBC 0.0 0.0 - 0.2 %      Comment: Performed at [facility], [address], [place], [place] [address]  Basic metabolic panel     Status: Abnormal    Collection Time: 08/10/20  4:28 AM  Result Value Ref Range    Sodium 138 135 - 145 mmol/L    Potassium 4.3 3.5 - 5.1 mmol/L    Chloride 104 98 - 111 mmol/L    CO2 26 22 - 32 mmol/L    Glucose, Bld 125 (H) 70 - 99 mg/dL      Comment: Glucose reference range applies only to samples taken after fasting for at least 8 hours.    BUN 24 (H) 8 - 23 mg/dL    Creatinine, Ser 3.53 (L) 0.61 - 1.24 mg/dL    Calcium 8.3 (L) 8.9 - 10.3 mg/dL    GFR, Estimated >29 >92 mL/min      Comment: (NOTE) Calculated using the CKD-EPI Creatinine Equation (2021)      Anion gap 8 5 - 15      Comment: Performed at [facility], [address], [place], [place] [address]       Imaging Results (Last 48 hours)  DG Abd Portable 1V   Result Date: 08/08/2020 CLINICAL DATA:  Feeding tube adjustment. EXAM: PORTABLE ABDOMEN - 1 VIEW COMPARISON:  None. FINDINGS: Tip of the weighted enteric tube in the right upper quadrant in the region of the distal stomach. No bowel dilatation to suggest obstruction. There is contrast in the colon from prior swallowing function. IMPRESSION: Tip of the weighted enteric tube in the right upper quadrant in the region of the distal stomach. Electronically Signed   By: Narda Rutherford M.D.   On: 08/08/2020 16:23             Medical Problem List and Plan: 1.  Debility with altered mental status secondary to CAD/anterior STEMI /cardiogenic shock with occluded proximal LAD status post stenting post subacute left frontal infarct              -patient may shower             -ELOS/Goals: 16-20 days, Mod I with PT/OT, supervision SLP 2.  Antithrombotics: -DVT/anticoagulation: Lovenox             -antiplatelet therapy: Aspirin 81 mg daily, Brilinta 90 mg twice daily 3. Pain Management: Oxycodone as needed 4. Mood: Klonopin 0.5 mg 3 times daily             -antipsychotic agents: N/A 5. Neuropsych: This patient is not capable of making decisions on his own behalf.             -is fall risk, safety plan needed 6. Skin/Wound Care: Routine skin checks 7. Fluids/Electrolytes/Nutrition: Routine in and outs with follow-up chemistries on admit. 8.  Acute hypoxemic respiratory failure.  Patient extubated.  Suspect HCAP completing 7-day course of antibiotic therapy 9.  Right pleural effusion.  Status postthoracentesis 08/01/2020 with 450 cc fluid removal.  Follow-up chest x-ray 08/02/2020 without effusion 10.  Dysphagia.  NPO. NGT feedings currently -f/u MBS, plan per SLP 11.  Hypertension.  Coreg 3.125 mg twice daily, Lanoxin 0.125 mg daily 12.  Hyperlipidemia.  Crestor 13.  New findings diabetes mellitus.  Hemoglobin A1c 6.5.  SSI.  NovoLog 3 units every 4 hours.  Diabetic teaching 14.  Tobacco abuse.  NicoDerm patch.  Counseling     Mcarthur Rossetti Angiulli, PA-C 08/10/2020   I have personally performed a face to face diagnostic evaluation of this patient and formulated the key components of the plan.  Additionally, I have personally reviewed laboratory data, imaging studies, as well as relevant notes and concur with the physician assistant's documentation above.  The patient's status has not changed from the original H&P.  Any changes in documentation from the acute care chart have been noted above.  Ranelle Oyster, MD, [place]

## 2020-08-10 NOTE — Progress Notes (Signed)
  Echocardiogram 2D Echocardiogram has been performed.  Vincent Horton 08/10/2020, 3:57 PM

## 2020-08-10 NOTE — Progress Notes (Signed)
Patient arrived on unit, patient nonverbal and attempting to get out of bed. AX1.  Vincent Horton

## 2020-08-10 NOTE — Progress Notes (Signed)
Inpatient Rehab Admissions Coordinator:   I have a bed for this patient to admit to CIR today.  Will let pt/family and TOC team know.    Estill Dooms, PT, DPT Admissions Coordinator [phone] 08/10/20  10:36 AM

## 2020-08-10 NOTE — Progress Notes (Signed)
PMR Admission Coordinator Pre-Admission Assessment   Patient: Vincent Horton is an 65 y.o., male MRN: [id number] DOB: Jul 12, 1955 Height: 5' 4"  (162.6 cm) Weight: 45.3 kg   Insurance Information HMO:     PPO:      PCP:      IPA:      80/20:      OTHER: PRIMARY: Medicare A and B      Policy#: [id number]      Subscriber: pt CM Name:       Phone#:      Fax#: Pre-Cert#: verified Civil engineer, contracting: Benefits:  Phone #:      Name: Eff. Date: 02/01/2019 A and B     Deduct: $1556      Out of Pocket Max: n/a      Life Max: n/a CIR: 100%      SNF: 20 full days Outpatient: 80%     Co-Ins: 20% Home Health: 100%      Co-Pay: DME: 80%     Co Ins: 20% Providers: pt choice SECONDARY: Medicaid ([employer])     Policy#: [id number] s     Phone#:   Financial Counselor:       Phone#:   The "Data Collection Information Summary" for patients in Inpatient Rehabilitation Facilities with attached "Privacy Act [place] Records" was provided and verbally reviewed with: Patient and Family   Emergency Contact Information Contact Information       Name Relation Home Work Prentiss Son [phone]   [phone]    [facility] Daughter     [phone]    Su Hoff     [phone]           Current Medical History  Patient Admitting Diagnosis: cardiac arrest, L frontal CVA   History of Present Illness: Vincent Horton is a 65 year old right-handed Somali speaking (limited [place]) male with unknown medical history on no prescription medications.  Presented 07/21/2020 after being found unresponsive by family.  EMS arrival patient pulseless and apneic.  Initially in V. fib total of 5 shocks, 3 epis, 450 mg amiodarone 15 minutes of CPR.  Patient agitated and hypotensive upon arrival to the ED received Versed.  Admission chemistry glucose 275, creatinine 1.40, AST 328, ALT 251, hemoglobin A1c 6.5, troponin 2747, WBC 13,200, lactic acid 3.7.  EKG showing ST segment changes in anterior lateral  leads and underwent emergent cardiac catheterization requiring LAD stenting.  Echocardiogram ejection fraction of 30 to 35% left ventricle moderately decreased function.  Cranial CT scan negative for cerebral edema.  No evidence of anoxic brain injury.  Hypodensity in the deep white matter in the frontal lobes bilaterally likely due to chronic microvascular ischemia.  He did require vasopressors for hypotension.  Hospital course respiratory failure intubation treated for right side aspiration pneumonia as well as patient developed right pleural effusion undergoing thoracentesis 08/01/2020 with 450 cc fluid removal.  Follow-up chest x-ray 08/02/2020 without effusion.  EEG negative for seizure but did suggest severe diffuse encephalopathy.  Patient remains on low-dose aspirin therapy per cardiology services as well as Brilinta.  Subcutaneous Lovenox for DVT prophylaxis.  Currently n.p.o. with alternative means of nutritional support.  MRI of the brain completed 08/07/2020 due to persistent altered mental status, showing changes post cardiac arrest and also showing punctate acute to subacute left frontal infarct.  No evidence of hypoxic ischemic injury.  Patient remained on low-dose aspirin/Brilinta therapy for both CVA prophylaxis as well as cardiac.  Palliative care  consulted to establish goals of care.  Therapy evaluations completed due to patient's altered mental status cardiac arrest was recommended for a comprehensive rehab program   [facility]'s [facility] record [facility] has been reviewed by the rehabilitation admission coordinator and physician.   Past Medical History      Past Medical History:  Diagnosis Date   Cardiac arrest [facility])     Coronary artery disease     Dysrhythmia     Myocardial infarction [facility])        Family History   family history is not on file.   Prior Rehab/Hospitalizations Has the patient had prior rehab or hospitalizations prior to admission? No   Has the patient had major  surgery during 100 days prior to admission? No               Current Medications   Current Facility-Administered Medications:   0.9 %  sodium chloride infusion, 250 mL, Intravenous, Continuous, Anders Simmonds, MD, Stopped at 08/06/20 [phone]   acetaminophen (TYLENOL) 160 MG/5ML solution 650 mg, 650 mg, Per Tube, Q6H PRN, Icard, Bradley L, DO, 650 mg at 08/01/20 1008   aspirin chewable tablet 81 mg, 81 mg, Per Tube, Daily, [place], Peter M, MD, 81 mg at 08/10/20 0954   carvedilol (COREG) tablet 3.125 mg, 3.125 mg, Per Tube, BID WC, Larey Dresser, MD, 3.125 mg at 08/10/20 0955   chlorhexidine (PERIDEX) 0.12 % solution 15 mL, 15 mL, Mouth Rinse, BID, Candee Furbish, MD, 15 mL at 08/10/20 4132   clonazePAM (KLONOPIN) disintegrating tablet 0.5 mg, 0.5 mg, Per Tube, TID, Jennelle Human B, NP, 0.5 mg at 08/10/20 0954   digoxin (LANOXIN) tablet 0.125 mg, 0.125 mg, Per Tube, Daily, Candee Furbish, MD, 0.125 mg at 08/10/20 0954   enoxaparin (LOVENOX) injection 30 mg, 30 mg, Subcutaneous, Daily, Lyndee Leo, RPH, 30 mg at 08/10/20 0953   famotidine (PEPCID) tablet 20 mg, 20 mg, Per Tube, BID, Einar Grad, RPH, 20 mg at 08/10/20 0954   feeding supplement (OSMOLITE 1.5 CAL) liquid 1,000 mL, 1,000 mL, Per Tube, Continuous, Candee Furbish, MD, Last Rate: 45 mL/hr at 08/10/20 0600, Infusion Verify at 08/10/20 0600   feeding supplement (PROSource TF) liquid 45 mL, 45 mL, Per Tube, BID, Candee Furbish, MD, 45 mL at 08/09/20 2109   free water 100 mL, 100 mL, Per Tube, Q4H, Estill Cotta, NP, 100 mL at 08/10/20 0956   guaiFENesin-dextromethorphan (ROBITUSSIN DM) 100-10 MG/5ML syrup 10 mL, 10 mL, Per Tube, Q4H PRN, Anders Simmonds, MD, 10 mL at 08/05/20 2122   haloperidol lactate (HALDOL) injection 5 mg, 5 mg, Intravenous, Q6H PRN, Candee Furbish, MD, 5 mg at 08/06/20 0356   insulin aspart (novoLOG) injection 0-15 Units, 0-15 Units, Subcutaneous, Q4H, Mannam, Praveen, MD, 2 Units at 08/10/20  0957   insulin aspart (novoLOG) injection 3 Units, 3 Units, Subcutaneous, Q4H, Candee Furbish, MD, 3 Units at 08/10/20 0958   ipratropium-albuterol (DUONEB) 0.5-2.5 (3) MG/3ML nebulizer solution 3 mL, 3 mL, Nebulization, Q4H PRN, Hosie Poisson, MD   loperamide HCl (IMODIUM) 1 MG/7.5ML suspension 1 mg, 1 mg, Per Tube, PRN, Hosie Poisson, MD, 1 mg at 08/06/20 2131   MEDLINE mouth rinse, 15 mL, Mouth Rinse, q12n4p, Candee Furbish, MD, 15 mL at 08/10/20 0442   nicotine (NICODERM CQ - dosed in mg/24 hours) patch 14 mg, 14 mg, Transdermal, Daily, Candee Furbish, MD, 14 mg at 08/10/20 0959   ondansetron (  ZOFRAN) injection 4 mg, 4 mg, Intravenous, Q6H PRN, [place], Peter M, MD, 4 mg at 07/31/20 1541   oxyCODONE (Oxy IR/ROXICODONE) immediate release tablet 5 mg, 5 mg, Per Tube, Q8H, Candee Furbish, MD, 5 mg at 08/10/20 0448   rosuvastatin (CRESTOR) tablet 40 mg, 40 mg, Per Tube, Daily, [place], Peter M, MD, 40 mg at 08/10/20 7412   ticagrelor (BRILINTA) tablet 90 mg, 90 mg, Per Tube, BID, [place] Eric J, DO, 90 mg at 08/10/20 8786   Patients Current Diet:  Diet Order                  Diet NPO time specified  Diet effective now                         Precautions / Restrictions Precautions Precautions: Fall, Other (comment) Precaution Comments: cortrak Restrictions Weight Bearing Restrictions: No    Has the patient had 2 or more falls or a fall with injury in the past year? No   Prior Activity Level Limited Community (1-2x/wk): doesn't work, but does drive, no DME used at baseline   Prior Functional Level Self Care: Did the patient need help bathing, dressing, using the toilet or eating? Independent   Indoor Mobility: Did the patient need assistance with walking from room to room (with or without device)? Independent   Stairs: Did the patient need assistance with internal or external stairs (with or without device)? Independent   Functional Cognition: Did the patient need help  planning regular tasks such as shopping or remembering to take medications? Independent   Home Assistive Devices / Equipment Home Assistive Devices/Equipment: None Home Equipment: None   Prior Device Use: Indicate devices/aids used by the patient prior to current illness, exacerbation or injury? None of the above   Current Functional Level Cognition   Arousal/Alertness: Awake/alert Overall Cognitive Status: Impaired/Different from baseline Difficult to assess due to: Impaired communication Current Attention Level: Sustained, Selective Orientation Level: Oriented X4 Following Commands: Follows one step commands with increased time Safety/Judgement: Decreased awareness of safety, Decreased awareness of deficits General Comments: Minimal audible verbal communication beyond head nods and quietlyspeaking a few English words; following majority of simple commands in [place]. Still distracted by lines, but more easily redirected. Decreased safety awareness (attempting to turn and sit on RW bar) requiring frequent verbal cues for safety and sequencing. Still with increased flat affect; no family present this session Attention: Sustained Sustained Attention: Impaired Sustained Attention Impairment: Verbal basic Memory: Impaired Memory Impairment: Decreased recall of new information Awareness: Impaired Awareness Impairment: Intellectual impairment, Emergent impairment Behaviors: Impulsive Safety/Judgment: Impaired    Extremity Assessment (includes Sensation/Coordination)   Upper Extremity Assessment: Generalized weakness  Lower Extremity Assessment: Generalized weakness     ADLs   Overall ADL's : Needs assistance/impaired Eating/Feeding: NPO Eating/Feeding Details (indicate cue type and reason): Pt on feeding tube Grooming: Moderate assistance, Sitting Grooming Details (indicate cue type and reason): able to bring hand to face, fatigues quickly Upper Body Bathing: Moderate  assistance Lower Body Bathing: Maximal assistance Upper Body Dressing : Moderate assistance Lower Body Dressing: Maximal assistance Toilet Transfer: Moderate assistance Toilet Transfer Details (indicate cue type and reason): Pt simulated stand pivot transfer to recliner, requiring mod assist to stand and take steps to recliner. Toileting- Clothing Manipulation and Hygiene: Maximal assistance Functional mobility during ADLs: Moderate assistance General ADL Comments: Pt requiring mod verbal and tactile cues for hand placement with RW as well as with RW  placement during transfer.     Mobility   Overal bed mobility: Needs Assistance Bed Mobility: Supine to Sit Supine to sit: Supervision, HOB elevated, Min assist Sit to supine: Total assist General bed mobility comments: Initial supervision to come to sitting EOB; pt then reaching back for phone charger with posterior LOB, requiring up to minA for repeated partial sidelying<>sit suspect related to increased fatigue     Transfers   Overall transfer level: Needs assistance Equipment used: None, Rolling walker (2 wheeled) Transfers: Sit to/from Stand Sit to Stand: Mod assist Stand pivot transfers: Mod assist General transfer comment: Multiple sit<>stands from EOB, recliner and chair without armrests, no DME, pt requiring consistent modA for stability upon standing, reliant on UE support; additional trial with RW, minA for stability and poor safety awareness with RW management     Ambulation / Gait / Stairs / Wheelchair Mobility   Ambulation/Gait Ambulation/Gait assistance: Mod assist, Max assist, Min assist Gait Distance (Feet): 14 Feet (+28) Assistive device: None, Rolling walker (2 wheeled) Gait Pattern/deviations: Step-through pattern, Decreased stride length, Shuffle, Trunk flexed, Narrow base of support, Staggering right, Staggering left General Gait Details: Slow, unsteady gait without DME, pt insistent on use of RW and reaching far  outside of BOS to get despite verbal cues to try without, mod-maxA for stability; additional gait training with RW, pt with poor safety awareness and RW management, requiring consistent minA for balance, up to mod-maxA for stability and RW management; at one point attempting to turn and sit on RW bar; 1x seated rest break for fatigue and safety Gait velocity: Decreased     Posture / Balance Dynamic Sitting Balance Sitting balance - Comments: Can static sit edge of recliner without UE support, likely unable to accept significant challenge or reach outside BOS Balance Overall balance assessment: Needs assistance Sitting-balance support: Bilateral upper extremity supported, Feet supported Sitting balance-Leahy Scale: Fair Sitting balance - Comments: Can static sit edge of recliner without UE support, likely unable to accept significant challenge or reach outside BOS Postural control: Posterior lean Standing balance support: Bilateral upper extremity supported Standing balance-Leahy Scale: Poor Standing balance comment: Reliant on external assist to maintain static standing balance; performed ADL tasks at sink with either single UE or trunk support against counter, external assist as well to maintain balance     Special needs/care consideration Diabetic management yes    Previous Home Environment (from acute therapy documentation) Living Arrangements: Children (3 daughters)  Lives With: Family Available Help at Discharge: Family, Available 24 hours/day Type of Home: House Home Layout: One level Home Access:  (unsure) Entrance Stairs-Number of Steps: 3 Bathroom Shower/Tub: Chiropodist: Standard Home Care Services: No Additional Comments: PT history obtained from conversation with OT (based on phone call with pt's family), pt with difficulty verbalizing during session.   Discharge Living Setting Plans for Discharge Living Setting: Lives with (comment) (daughters) Type of  Home at Discharge: House Discharge Home Layout: One level Discharge Home Access: Stairs to enter Entrance Stairs-Rails: None Entrance Stairs-Number of Steps: 3 Discharge Bathroom Shower/Tub: Tub/shower unit Discharge Bathroom Toilet: Standard Discharge Bathroom Accessibility: Yes How Accessible: Accessible via walker Does the patient have any problems obtaining your medications?: No   Social/Family/Support Systems Anticipated Caregiver: Sadia (dtr) and Sheryle Hail (son) are main contacts, but has 2 other daughters who can help Anticipated Caregiver's Contact Information: Loralyn Freshwater [phone] Ability/Limitations of Caregiver: n/a Caregiver Availability: 24/7 Discharge Plan Discussed with Primary Caregiver: Yes Is Caregiver In Agreement with  Plan?: Yes Does Caregiver/Family have Issues with Lodging/Transportation while Pt is in Rehab?: No   Goals Patient/Family Goal for Rehab: PT/OT supervision to mod I, SLP supervision Expected length of stay: 16-20 days Pt/Family Agrees to Admission and willing to participate: Yes Program Orientation Provided & Reviewed with Pt/Caregiver Including Roles  & Responsibilities: Yes   Decrease burden of Care through IP rehab admission: n/a   Possible need for SNF placement upon discharge: Not anticipated.  Pt with good family support and excellent rehab potential.   Patient Condition: I have reviewed medical records from [facility], spoken with CM, and patient, son, and daughter. I met with patient at the bedside for inpatient rehabilitation assessment.  Patient will benefit from ongoing PT, OT, and SLP, can actively participate in 3 hours of therapy a day 5 days of the week, and can make measurable gains during the admission.  Patient will also benefit from the coordinated team approach during an Inpatient Acute Rehabilitation admission.  The patient will receive intensive therapy as well as Rehabilitation physician, nursing, social  worker, and care management interventions.  Due to bladder management, bowel management, safety, skin/wound care, disease management, medication administration, pain management, and patient education the patient requires 24 hour a day rehabilitation nursing.  The patient is currently mod/max with mobility and basic ADLs.  Discharge setting and therapy post discharge at home with home health is anticipated.  Patient has agreed to participate in the Acute Inpatient Rehabilitation Program and will admit today.   Preadmission Screen Completed By:  Michel Santee, PT, DPT 08/10/2020 10:34 AM ______________________________________________________________________   Discussed status with Dr. Naaman Plummer on 08/10/20  at 10:34 AM  and received approval for admission today.   Admission Coordinator:  Michel Santee, PT, DPT time 10:34 AM Sudie Grumbling 08/10/20     Assessment/Plan: Diagnosis: left frontal infarct Does the need for close, 24 hr/day Medical supervision in concert with the patient's rehab needs make it unreasonable for this patient to be served in a less intensive setting? Yes Co-Morbidities requiring supervision/potential complications: cad, mi Due to bladder management, bowel management, safety, skin/wound care, disease management, medication administration, pain management, and patient education, does the patient require 24 hr/day rehab nursing? Yes Does the patient require coordinated care of a physician, rehab nurse, PT, OT, and SLP to address physical and functional deficits in the context of the above medical diagnosis(es)? Yes Addressing deficits in the following areas: balance, endurance, locomotion, strength, transferring, bowel/bladder control, bathing, dressing, feeding, grooming, toileting, cognition, speech, and psychosocial support Can the patient actively participate in an intensive therapy program of at least 3 hrs of therapy 5 days a week? Yes The potential for patient to make measurable  gains while on inpatient rehab is excellent Anticipated functional outcomes upon discharge from inpatient rehab: modified independent and supervision PT, modified independent and supervision OT, supervision SLP Estimated rehab length of stay to reach the above functional goals is: 16-20 days Anticipated discharge destination: Home 10. Overall Rehab/Functional Prognosis: excellent     MD Signature: Meredith Staggers, MD, [facility] 08/10/20

## 2020-08-10 NOTE — TOC Transition Note (Signed)
Transition of Care [facility]) - CM/SW Discharge Note Heart Failure   Patient Details  Name: Franky Reier MRN: [id number] Date of Birth: 1955-06-24  Transition of [facility]) CM/SW Contact:  Lotus Gover, LCSWA Phone Number: 08/10/2020, 10:54 AM   Clinical Narrative:    CIR admission's coordinator reported to the patient care team that a bed is available today for CIR. Mr. Gosse will discharge to CIR for rehab.  CSW will sign off for now as social work intervention is no longer needed. Please consult [place] again if new needs arise.   Final next level of care: IP Rehab Facility Barriers to Discharge: No Barriers Identified   Patient Goals and CMS Choice        Discharge Placement                       Discharge Plan and Services In-house Referral: Clinical Social Work                                   Social Determinants of Health (SDOH) Interventions Housing Interventions: Intervention Not Indicated   Readmission Risk Interventions Readmission Risk Prevention Plan 08/06/2020  Transportation Screening Complete  PCP or Specialist Appt within 3-5 Days Not Complete  Not Complete comments awaiting HV outpatient follow up to be scheduled if applicable  HRI or Home Care Consult Complete  Palliative Care Screening Complete  Some recent data might be hidden    Judiann Celia, MSW, LCSWA [phone] Heart Failure Social Worker

## 2020-08-10 NOTE — Progress Notes (Signed)
Initial Nutrition Assessment  DOCUMENTATION CODES:  Severe malnutrition in context of chronic illness, Underweight  INTERVENTION:  Continue TF via Cortrak tube: -Osmolite 1.5 @ 45 ml/hr via Cortrak (1080 ml) -ProSource TF 45 ml BID - free water Q4H    Provides: 1700 kcals, 90 grams protein, 823 ml free water ( total free water with flushes).    Consider placement of PEG tube.    NUTRITION DIAGNOSIS:  Severe Malnutrition related to chronic illness as evidenced by severe fat depletion, severe muscle depletion.  GOAL:  Patient will meet greater than or equal to 90% of their needs  MONITOR:  TF tolerance, Weight trends, I & O's, Labs  REASON FOR ASSESSMENT:  Consult Enteral/tube feeding initiation and management  ASSESSMENT:  Pt with limited past medical history who was initially found down by family at home.  EMS was activated and patient was found to be in ventricular fibrillation.  Patient was shocked 5 times and given epinephrine and amiodarone with ROSC obtained approximately within 15 minutes.  Patient was intubated, started on vasopressors for cardiogenic shock and transferred to the intensive care unit.  Patient also was emergently taken to the catheterization lab due to ST segment changes in anterior leads and underwent revascularization of his LAD.  Milrinone drip was slowly weaned off.  Patient was extubated on 07/31/2020 and later transferred to the hospitalist service. Admitted to CIR on 6/10.  Pt continues to be NPO requiring TF via Cortrak (placed 5/23, tip is gastric per xray). Current regimen: Osmolite 1.5 @ 45 ml/hr via Cortrak (1080 ml) with ProSource TF 45 ml BID and free water Q4H. Recommend continue with current regimen, though will increase free water to better meet needs. If swallowing function does not improve, pt will need placement of PEG.   No weight history available for review. pt is underweight based on BMI.  No UOP documented x24  hours  Medications:  [START ON 08/11/2020] aspirin  81 mg Per Tube Daily   carvedilol  3.125 mg Per Tube BID WC   clonazepam  0.5 mg Per Tube TID   [START ON 08/11/2020] digoxin  0.125 mg Per Tube Daily   [START ON 08/11/2020] enoxaparin (LOVENOX) injection  30 mg Subcutaneous Q24H   famotidine  20 mg Per Tube BID   feeding supplement (PROSource TF)  45 mL Per Tube BID   free water  100 mL Per Tube Q4H   insulin aspart  0-15 Units Subcutaneous Q4H   insulin aspart  3 Units Subcutaneous Q4H   [START ON 08/11/2020] nicotine  14 mg Transdermal Daily   oxyCODONE  5 mg Per Tube Q8H   [START ON 08/11/2020] rosuvastatin  40 mg Per Tube Daily   ticagrelor  90 mg Per Tube BID   Labs: Recent Labs  Lab 08/08/20 0457 08/09/20 0333 08/10/20 0428  NA 139 141 138  K 4.5 4.2 4.3  CL 108 107 104  CO2 20* 24 26  BUN 23 30* 24*  CREATININE 0.75 0.84 0.59*  CALCIUM 8.2* 8.1* 8.3*  MG 2.2  --   --   GLUCOSE 76 142* 125*  CBGs [phone]    NUTRITION - FOCUSED PHYSICAL EXAM:  Flowsheet Row Most Recent Value  Orbital Region Severe depletion  Upper Arm Region Severe depletion  Thoracic and Lumbar Region Severe depletion  Buccal Region Severe depletion  Temple Region Severe depletion  Clavicle Bone Region Severe depletion  Clavicle and Acromion Bone Region Severe depletion  Scapular Bone Region Severe  depletion  Dorsal Hand Severe depletion  Patellar Region Severe depletion  Anterior Thigh Region Severe depletion  Posterior Calf Region Severe depletion  Edema (RD Assessment) None  Hair Reviewed  Eyes Reviewed  Mouth Reviewed  Skin Reviewed  Nails Reviewed       Diet Order:   Diet Order             Diet NPO time specified  Diet effective now                  EDUCATION NEEDS:  Not appropriate for education at this time  Skin:  Skin Assessment: Reviewed RN Assessment  Last BM:  6/8  Height:  Ht Readings from Last 1 Encounters:  08/10/20 5\' 4"  (1.626 m)   Weight:   Wt Readings from Last 1 Encounters:  08/10/20 45.3 kg   BMI:  Body mass index is 17.14 kg/m.  Estimated Nutritional Needs:  Kcal:  1500-1700 Protein:  80-105g Fluid:  >1.5L/d    10/10/20, MS, RD, LDN RD pager number and weekend/on-call pager number located in Amion.

## 2020-08-11 ENCOUNTER — Inpatient Hospital Stay (HOSPITAL_COMMUNITY): Payer: Medicare Other

## 2020-08-11 DIAGNOSIS — R059 Cough, unspecified: Secondary | ICD-10-CM

## 2020-08-11 DIAGNOSIS — J811 Chronic pulmonary edema: Secondary | ICD-10-CM

## 2020-08-11 LAB — GLUCOSE, CAPILLARY
Glucose-Capillary: 138 mg/dL — ABNORMAL HIGH (ref 70–99)
Glucose-Capillary: 152 mg/dL — ABNORMAL HIGH (ref 70–99)
Glucose-Capillary: 116 mg/dL — ABNORMAL HIGH (ref 70–99)
Glucose-Capillary: 159 mg/dL — ABNORMAL HIGH (ref 70–99)
Glucose-Capillary: 136 mg/dL — ABNORMAL HIGH (ref 70–99)

## 2020-08-11 MED ORDER — ORAL CARE MOUTH RINSE
15.0000 mL | Freq: Two times a day (BID) | OROMUCOSAL | Status: DC
  Administered 2020-08-11 – 2020-08-23 (×22): 15 mL via OROMUCOSAL

## 2020-08-11 MED ORDER — FUROSEMIDE 40 MG PO TABS
40.0000 mg | ORAL_TABLET | Freq: Once | ORAL | Status: AC
  Administered 2020-08-11: 40 mg
  Filled 2020-08-11: qty 1

## 2020-08-11 MED ORDER — ALBUTEROL SULFATE (2.5 MG/3ML) 0.083% IN NEBU
2.5000 mg | INHALATION_SOLUTION | RESPIRATORY_TRACT | Status: DC | PRN
  Administered 2020-08-11 – 2020-08-12 (×2): 2.5 mg via RESPIRATORY_TRACT
  Filled 2020-08-11 (×2): qty 3

## 2020-08-11 MED ORDER — CHLORHEXIDINE GLUCONATE 0.12 % MT SOLN
15.0000 mL | Freq: Two times a day (BID) | OROMUCOSAL | Status: DC
  Administered 2020-08-11 – 2020-08-24 (×25): 15 mL via OROMUCOSAL
  Filled 2020-08-11 (×27): qty 15

## 2020-08-11 NOTE — Progress Notes (Signed)
Patient did not require midnight and 4am insulin coverage due to hypoglycemia prior. Blood sugar numbers have been ranging in the 2 digits and lower 100s to mid 100s.

## 2020-08-11 NOTE — Evaluation (Signed)
Speech Language Pathology Assessment and Plan  Patient Details  Name: Vincent Horton MRN: [id number] Date of Birth: Dec 07, 1955  SLP Diagnosis: Cognitive Impairments;Speech and Language deficits;Dysphagia  Rehab Potential: Fair ELOS: 1.5-2 weeks   Today's Date: 08/11/2020 SLP Individual Time: 1105-1200 SLP Individual Time Calculation (min): 55 min  Hospital Problem: Principal Problem:   CVA (cerebral vascular accident) [facility])  Past Medical History:  Past Medical History:  Diagnosis Date   Cardiac arrest ([place])    Coronary artery disease    Dysrhythmia    Myocardial infarction [facility])    Past Surgical History:  Past Surgical History:  Procedure Laterality Date   CARDIAC CATHETERIZATION     CORONARY/GRAFT ACUTE MI REVASCULARIZATION N/A 07/21/2020   Procedure: Coronary/Graft Acute MI Revascularization;  Surgeon: Martinique, Peter M, MD;  Location: [place] CV LAB;  Service: Cardiovascular;  Laterality: N/A;   LEFT HEART CATH AND CORONARY ANGIOGRAPHY N/A 07/21/2020   Procedure: LEFT HEART CATH AND CORONARY ANGIOGRAPHY;  Surgeon: Martinique, Peter M, MD;  Location: [place] CV LAB;  Service: Cardiovascular;  Laterality: N/A;   RIGHT HEART CATH N/A 07/21/2020   Procedure: RIGHT HEART CATH;  Surgeon: Martinique, Peter M, MD;  Location: [place] CV LAB;  Service: Cardiovascular;  Laterality: N/A;    Assessment / Plan / Recommendation Clinical Impression   Patient is a 65 y.o. year old right-handed non-English-speaking [place] male with unknown medical history on no prescription medications.  Per chart review patient lives with her 3 daughters.  Independent prior to admission.  1 level home 3 steps to entry.  Presented 07/21/2020 after being found unresponsive by family.  EMS arrival patient pulseless and apneic.  Initially in V. fib total of 5 shocks, 3 epis, 450 mg amiodarone 15 minutes of CPR.  Patient agitated and hypotensive upon arrival to the ED received Versed.  Admission chemistry glucose 275,  creatinine 1.40, AST 328, ALT 251, hemoglobin A1c 6.5, troponin 2747, WBC 13,200, lactic acid 3.7.  EKG showing ST segment changes in anterior lateral leads and underwent emergent cardiac catheterization requiring LAD stenting.  Echocardiogram ejection fraction of 30 to 35% left ventricle moderately decreased function.  Cranial CT scan negative for cerebral edema.  No evidence of anoxic brain injury.  Hypodensity in the deep white matter in the frontal lobes bilaterally likely due to chronic microvascular ischemia.  She did require vasopressors for hypotension.  Hospital course respiratory failure intubation treated for right side aspiration pneumonia as well as patient developed right pleural effusion undergoing thoracentesis 08/01/2020 with 450 cc fluid removal.  Follow-up chest x-ray 08/02/2020 without effusion...  EEG negative for seizure but did suggest severe diffuse encephalopathy.  Patient remains on low-dose aspirin therapy per cardiology services as well as Brilinta.  Subcutaneous Lovenox for DVT prophylaxis.  Currently n.p.o. with alternative means of nutritional support.  MRI of the brain completed 08/07/2020 due to persistent altered mental status changes post cardiac arrest showing punctate acute to subacute left frontal infarct.  No evidence of hypoxic ischemic injury.  Patient remained on low-dose aspirin/Brilinta therapy for both CVA prophylaxis as well as cardiac.  Palliative care consulted to establish goals of care.  Therapy evaluations completed due to patient's altered mental status cardiac arrest was admitted for a comprehensive rehab program. Patient transferred to CIR on 08/10/2020 .  Pt presents with severe oropharyngeal dysphagia, consistent with results from [facility] 08/06/20. Pt with significant oral and pharyngeal secretions with poor management. Pt requiring frequent suction throughout assessment to attempt to clear wet vocal  quality and increase intelligibility. Pt remains largely dysphonic,  intelligibility at word level ~25%. RN states she has completed oral care x2 this AM. Trialed ice chips x3 with poor bolus awareness, extended oral manipulation and transit with immediate, wet, weak cough on 2/3 trials. Trials halted d/t overt s/s aspiration and difficulty with secretions. RN made aware, respiratory therapy consulted. Recommend to remain strict NPO at this time via [employer].   With use of [place]i audio translator services, it appears patient has moderate deficits in expressive and receptive language at this time. Pt can understand basic English and chooses to respond to simple questions in [place] despite Optometrist. Pt naming pictures with ~60% accuracy, able to follow 1-2 step directions with mild impairment. Difficult to assess cognition at this time d/t translator services not understanding pt low vocal volume and pt fatigue. Pt appears to have decreased sustained attention and significant deficits in awareness. Pt impulsive, attempting to stand from chair and transfer a few times during assessment. Pt will benefit from continued assessment of speech/language/cognition to determine current function.    Skilled Therapeutic Interventions          Pt participating in Clinical Swallow Evaluation as well as assessment of expressive and receptive language. Please see above.    SLP Assessment  Patient will need skilled Speech Lanaguage Pathology Services during CIR admission    Recommendations  SLP Diet Recommendations: NPO;Alternative means - temporary Medication Administration: Via alternative means Oral Care Recommendations: Oral care QID Recommendations for Other Services: Neuropsych consult Patient destination: Home Follow up Recommendations: Home Health SLP;Outpatient SLP Equipment Recommended: To be determined    SLP Frequency 3 to 5 out of 7 days   SLP Duration  SLP Intensity  SLP Treatment/Interventions 3-4 weeks  Minumum of 1-2 x/day, 30 to 90 minutes  Cognitive  remediation/compensation;Multimodal communication approach;Speech/Language facilitation;Therapeutic Activities;Therapeutic Exercise;Functional tasks;Cueing hierarchy;Internal/external aids;Dysphagia/aspiration precaution training;Medication managment;Patient/family education    Pain Pain Assessment Pain Scale: 0-10 Pain Score: 0-No pain  Prior Functioning Cognitive/Linguistic Baseline: Within functional limits Type of Home: House  Lives With: Family Available Help at Discharge: Family  SLP Evaluation Cognition Overall Cognitive Status: Impaired/Different from baseline Arousal/Alertness: Awake/alert Orientation Level: Oriented to person;Oriented to place;Disoriented to situation Attention: Sustained Sustained Attention: Impaired Sustained Attention Impairment: Verbal basic Memory: Impaired Memory Impairment: Decreased recall of new information Immediate Memory Recall:  (Unable to complete due to language barrier and interpreter unable to hear pt.) Awareness: Impaired Awareness Impairment: Anticipatory impairment;Emergent impairment;Intellectual impairment Problem Solving: Impaired Problem Solving Impairment: Verbal basic Executive Function: Reasoning;Decision Making;Self Monitoring Reasoning: Impaired Reasoning Impairment: Verbal basic Decision Making: Impaired Decision Making Impairment: Verbal basic Self Monitoring: Impaired Self Monitoring Impairment: Verbal basic Behaviors: Impulsive Safety/Judgment: Impaired Comments: Unable to complete true cognitive assessment due to language barrier and interpreter unable to hear pt. Pt aware he was in hospital and repeatedly requesting to return home.  Comprehension Auditory Comprehension Overall Auditory Comprehension: Impaired Yes/No Questions: Impaired Basic Biographical Questions: 76-100% accurate Complex Questions: 25-49% accurate Commands: Impaired Two Step Basic Commands: 50-74% accurate Multistep Basic Commands: 25-49%  accurate Conversation: Simple Interfering Components: Attention;Other (comment) (language barrier) EffectiveTechniques: Extra processing time;Increased volume;Repetition;Slowed speech;Visual/Gestural cues Visual Recognition/Discrimination Discrimination: Within Function Limits Expression Expression Primary Mode of Expression: Verbal Verbal Expression Overall Verbal Expression: Impaired Initiation: Impaired Automatic Speech: Name Level of Generative/Spontaneous Verbalization: Word Repetition: Impaired Level of Impairment: Phrase level Naming: Impairment Responsive: 51-75% accurate Confrontation: Impaired Written Expression Dominant Hand: Right Written Expression: Exceptions to [facility] Dictation Ability: Word Oral Motor Oral  Motor/Sensory Function Overall Oral Motor/Sensory Function: Generalized oral weakness Motor Speech Overall Motor Speech: Impaired (impaired phonation impacting intelligibility) Phonation: Wet;Breathy;Low vocal intensity Intelligibility: Intelligibility reduced Word: 25-49% accurate Phrase: 0-24% accurate Sentence: 0-24% accurate Effective Techniques: Increased vocal intensity  Care Tool Care Tool Cognition Expression of Ideas and Wants Expression of Ideas and Wants: Some difficulty - exhibits some difficulty with expressing needs and ideas (e.g, some words or finishing thoughts) or speech is not clear   Understanding Verbal and Non-Verbal Content Understanding Verbal and Non-Verbal Content: Sometimes understands - understands only basic conversations or simple, direct phrases. Frequently requires cues to understand   Memory/Recall Ability *first 3 days only Memory/Recall Ability *first 3 days only: That he or she is in a hospital/hospital unit    Intelligibility: Intelligibility reduced Word: 25-49% accurate Phrase: 0-24% accurate Sentence: 0-24% accurate  Bedside Swallowing Assessment General Previous Swallow Assessment: MBSS 6/6 Diet Prior to this  Study: NG Tube;NPO Temperature Spikes Noted: No Respiratory Status: Room air History of Recent Intubation: Yes Length of Intubations (days): 10 days Date extubated: 07/31/20 Behavior/Cognition: Alert;Cooperative Oral Cavity - Dentition: Poor condition;Missing dentition Patient Positioning: Upright in chair/Tumbleform Baseline Vocal Quality: Wet;Hoarse;Low vocal intensity Volitional Cough: Weak Volitional Swallow: Able to elicit  Oral Care Assessment Does patient have any of the following "high(er) risk" factors?: Diet - patient on tube feedings Ice Chips Ice chips: Impaired Presentation: Spoon Oral Phase Impairments: Impaired mastication;Poor awareness of bolus Oral Phase Functional Implications: Oral holding;Prolonged oral transit Pharyngeal Phase Impairments: Suspected delayed Swallow;Decreased hyoid-laryngeal movement;Wet Vocal Quality;Cough - Immediate Thin Liquid Thin Liquid: Not tested Nectar Thick Nectar Thick Liquid: Not tested Honey Thick Honey Thick Liquid: Not tested Puree Puree: Not tested Solid Solid: Not tested BSE Assessment Risk for Aspiration Impact on safety and function: Severe aspiration risk;Risk for inadequate nutrition/hydration Other Related Risk Factors: Deconditioning;Decreased respiratory status;Prolonged intubation  Short Term Goals: Week 1: SLP Short Term Goal 1 (Week 1): Pt will name common objects/pictures with 90% accuracy provided min A SLP Short Term Goal 2 (Week 1): Pt will consistently follow 2-3 step commands with min A SLP Short Term Goal 3 (Week 1): Pt will sustain attention to functional tasks for 5-7 minutes with mod A SLP Short Term Goal 4 (Week 1): Pt will utilize compensatory strategies as trained to increase intelligibility at word/phrase level with max A SLP Short Term Goal 5 (Week 1): Pt will tolerate ice chip trials utilizing compensatory strategies with minimal s/s aspiration provided mod A cues SLP Short Term Goal 6 (Week 1):  Pt will participate in further testing of cognitive communication function  Refer to Care Plan for Long Term Goals  Recommendations for other services: Neuropsych  Discharge Criteria: Patient will be discharged from SLP if patient refuses treatment 3 consecutive times without medical reason, if treatment goals not met, if there is a change in medical status, if patient makes no progress towards goals or if patient is discharged from hospital.  The above assessment, treatment plan, treatment alternatives and goals were discussed and mutually agreed upon: by patient  Dewaine Conger 08/11/2020, 1:03 PM

## 2020-08-11 NOTE — Progress Notes (Addendum)
PROGRESS NOTE   Subjective/Complaints:   Pt sleeping; coughing some- per nurse, coughing a lot- pretty constant-  Last CXR 6/2- showed asp pneumonia- Has Cortrak.    ROS: Limited due to language   Objective:   DG CHEST PORT 1 VIEW  Result Date: 08/11/2020 CLINICAL DATA:  Coughing.  CVA. EXAM: PORTABLE CHEST 1 VIEW COMPARISON:  08/02/2020 FINDINGS: Lung apices are cut off the film. Enteric tube courses through the stomach and off the film as tip is not visualized. Lungs are adequately inflated with subtle hazy prominence of the infrahilar markings. No lobar consolidation or effusion. Coronary stent unchanged. Cardiomediastinal silhouette and remainder of the exam is unchanged. IMPRESSION: Suggestion of minimal vascular congestion. Electronically Signed   By: Elberta Fortis M.D.   On: 08/11/2020 10:38   ECHOCARDIOGRAM COMPLETE  Result Date: 08/10/2020    ECHOCARDIOGRAM REPORT   Patient Name:   Vincent Horton Date of Exam: 08/10/2020 Medical Rec #:  [id number]  Height:       64.0 in Accession #:    [id number] Weight:       99.9 lb Date of Birth:  1955-12-22   BSA:          1.456 m Patient Age:    65 years   BP:           95/72 mmHg Patient Gender: M          HR:           86 bpm. Exam Location:  Inpatient Procedure: 2D Echo, 3D Echo, Intracardiac Opacification Agent, Color Doppler and            Cardiac Doppler Indications:    Stroke.  History:        Patient has prior history of Echocardiogram examinations, most                 recent 07/21/2020. Cardiomyopathy, Previous Myocardial Infarction                 and CAD, Arrythmias:Cardiac Arrest; Signs/Symptoms:Dyspnea.                 Cardiac shock.  Sonographer:    Sheralyn Boatman RDCS Referring Phys: [phone] [place] [facility]  Sonographer Comments: Technically difficult study due to poor echo windows. IMPRESSIONS  1. Left ventricular ejection fraction, by estimation, is 35 to 40%. The left ventricle has  moderately decreased function. The left ventricle demonstrates regional wall motion abnormalities (see scoring diagram/findings for description). There is mild concentric left ventricular hypertrophy. Left ventricular diastolic parameters are indeterminate. There is akinesis of the left ventricular, apical segment. There is akinesis of the left ventricular, apical septal wall and anterior wall. There is akinesis of the left ventricular, apical segment. No obvious LV thrombus is present but there is swirling of definity contrast in the apex consistent with sluggish flow. Cannot rule out early forming apical thrombus.  2. Right ventricular systolic function is normal. The right ventricular size is normal. There is normal pulmonary artery systolic pressure.  3. The mitral valve is normal in structure. No evidence of mitral valve regurgitation. No evidence of mitral stenosis.  4. The aortic valve is tricuspid.  Aortic valve regurgitation is not visualized. Mild to moderate aortic valve sclerosis/calcification is present, without any evidence of aortic stenosis.  5. The inferior vena cava is normal in size with greater than 50% respiratory variability, suggesting right atrial pressure of 3 mmHg. FINDINGS  Left Ventricle: No obvious LV thrombus is present but there is swirling of definity contrast in the apex consistent with sluggish flow. Cannot rule out early forming apical thrombus. Left ventricular ejection fraction, by estimation, is 35 to 40%. The left ventricle has moderately decreased function. The left ventricle demonstrates regional wall motion abnormalities. Definity contrast agent was given IV to delineate the left ventricular endocardial borders. The left ventricular internal cavity size was normal in size. There is mild concentric left ventricular hypertrophy. Left ventricular diastolic parameters are indeterminate. Right Ventricle: The right ventricular size is normal. No increase in right ventricular wall  thickness. Right ventricular systolic function is normal. There is normal pulmonary artery systolic pressure. The tricuspid regurgitant velocity is 2.38 m/s, and  with an assumed right atrial pressure of 3 mmHg, the estimated right ventricular systolic pressure is 25.7 mmHg. Left Atrium: Left atrial size was normal in size. Right Atrium: Right atrial size was normal in size. Pericardium: There is no evidence of pericardial effusion. Mitral Valve: The mitral valve is normal in structure. No evidence of mitral valve regurgitation. No evidence of mitral valve stenosis. Tricuspid Valve: The tricuspid valve is normal in structure. Tricuspid valve regurgitation is mild . No evidence of tricuspid stenosis. Aortic Valve: The aortic valve is tricuspid. Aortic valve regurgitation is not visualized. Mild to moderate aortic valve sclerosis/calcification is present, without any evidence of aortic stenosis. Pulmonic Valve: The pulmonic valve was normal in structure. Pulmonic valve regurgitation is not visualized. No evidence of pulmonic stenosis. Aorta: The aortic root is normal in size and structure. Venous: The inferior vena cava is normal in size with greater than 50% respiratory variability, suggesting right atrial pressure of 3 mmHg. IAS/Shunts: No atrial level shunt detected by color flow Doppler.  LEFT VENTRICLE PLAX 2D LVIDd:         3.30 cm     Diastology LVIDs:         2.50 cm     LV e' medial:    7.07 cm/s LV IVS:        1.20 cm     LV E/e' medial:  8.9 LVOT diam:     2.10 cm     LV e' lateral:   6.74 cm/s LV SV:         48          LV E/e' lateral: 9.4 LV SV Index:   33 LVOT Area:     3.46 cm  LV Volumes (MOD) LV vol d, MOD A2C: 56.9 ml LV vol d, MOD A4C: 47.8 ml LV vol s, MOD A2C: 34.9 ml LV vol s, MOD A4C: 27.4 ml LV SV MOD A2C:     22.0 ml LV SV MOD A4C:     47.8 ml LV SV MOD BP:      20.1 ml RIGHT VENTRICLE             IVC RV S prime:     12.60 cm/s  IVC diam: 1.80 cm TAPSE (M-mode): 2.0 cm LEFT ATRIUM              Index      RIGHT ATRIUM          Index LA diam:  2.70 cm 1.85 cm/m RA Area:     7.73 cm LA Vol (A2C):   4.2 ml  2.89 ml/m RA Volume:   12.70 ml 8.72 ml/m LA Vol (A4C):   13.2 ml 9.06 ml/m LA Biplane Vol: 7.9 ml  5.44 ml/m  AORTIC VALVE LVOT Vmax:   87.20 cm/s LVOT Vmean:  51.900 cm/s LVOT VTI:    0.138 m  AORTA Ao Root diam: 3.10 cm Ao Asc diam:  3.20 cm MITRAL VALVE               TRICUSPID VALVE MV Area (PHT): 4.54 cm    TR Peak grad:   22.7 mmHg MV Decel Time: 167 msec    TR Vmax:        238.00 cm/s MV E velocity: 63.05 cm/s MV A velocity: 61.95 cm/s  SHUNTS MV E/A ratio:  1.02        Systemic VTI:  0.14 m                            Systemic Diam: 2.10 cm Armanda Magic MD Electronically signed by Armanda Magic MD Signature Date/Time: 08/10/2020/4:27:13 PM    Final    VAS US CAROTID  Result Date: 08/10/2020 Carotid Arterial Duplex Study Patient Name:  ABHAY GODBOLT  Date of Exam:   08/10/2020 Medical Rec #: [id number]   Accession #:    [id number] Date of Birth: 10-03-1955    Patient Gender: M Patient Age:   65Y Exam Location:  [facility] Procedure:      VAS US CAROTID Referring Phys: [id number] Malachi Carl STACK --------------------------------------------------------------------------------  Indications:       CVA. Comparison Study:  No prior studies. Performing Technologist: Jean Rosenthal RDMS,RVT  Examination Guidelines: A complete evaluation includes B-mode imaging, spectral Doppler, color Doppler, and power Doppler as needed of all accessible portions of each vessel. Bilateral testing is considered an integral part of a complete examination. Limited examinations for reoccurring indications may be performed as noted.  Right Carotid Findings: +----------+--------+--------+--------+------------------+------------------+           PSV cm/sEDV cm/sStenosisPlaque DescriptionComments           +----------+--------+--------+--------+------------------+------------------+ CCA Prox  68      19                                                    +----------+--------+--------+--------+------------------+------------------+ CCA Distal97      31                                intimal thickening +----------+--------+--------+--------+------------------+------------------+ ICA Prox  85      27                                                   +----------+--------+--------+--------+------------------+------------------+ ICA Distal77      27                                                   +----------+--------+--------+--------+------------------+------------------+  ECA       105     19                                intimal thickening +----------+--------+--------+--------+------------------+------------------+ +----------+--------+-------+--------+-------------------+           PSV cm/sEDV cmsDescribeArm Pressure (mmHG) +----------+--------+-------+--------+-------------------+ [id number]                                         +----------+--------+-------+--------+-------------------+ +---------+--------+--+--------+--+ VertebralPSV cm/s47EDV cm/s13 +---------+--------+--+--------+--+  Left Carotid Findings: +----------+--------+--------+--------+------------------+------------------+           PSV cm/sEDV cm/sStenosisPlaque DescriptionComments           +----------+--------+--------+--------+------------------+------------------+ CCA Prox  107     30                                intimal thickening +----------+--------+--------+--------+------------------+------------------+ CCA Distal110     33                                intimal thickening +----------+--------+--------+--------+------------------+------------------+ ICA Prox  62      19              mild, heterogenous                   +----------+--------+--------+--------+------------------+------------------+ ICA Distal71      30                                                    +----------+--------+--------+--------+------------------+------------------+ ECA       76      13                                                   +----------+--------+--------+--------+------------------+------------------+ +----------+--------+--------+--------+-------------------+           PSV cm/sEDV cm/sDescribeArm Pressure (mmHG) +----------+--------+--------+--------+-------------------+ [id number]                                         +----------+--------+--------+--------+-------------------+ +---------+--------+--+--------+-+ VertebralPSV cm/s30EDV cm/s9 +---------+--------+--+--------+-+   Summary: Right Carotid: The extracranial vessels were near-normal with only minimal wall                thickening or plaque. Left Carotid: The extracranial vessels were near-normal with only minimal wall               thickening or plaque. Vertebrals:  Bilateral vertebral arteries demonstrate antegrade flow. Subclavians: Normal flow hemodynamics were seen in bilateral subclavian              arteries. *See table(s) above for measurements and observations.  Electronically signed by Delia Heady MD on 08/10/2020 at 11:52:21 AM.    Final    Recent Labs    08/10/20 0428  WBC 9.1  HGB 11.0*  HCT 34.5*  PLT 354   Recent Labs  08/09/20 0333 08/10/20 0428  NA 141 138  K 4.2 4.3  CL 107 104  CO2 24 26  GLUCOSE 142* 125*  BUN 30* 24*  CREATININE 0.84 0.59*  CALCIUM 8.1* 8.3*    Intake/Output Summary (Last 24 hours) at 08/11/2020 1453 Last data filed at 08/11/2020 0500 Gross per 24 hour  Intake 0 ml  Output 1350 ml  Net -1350 ml        Physical Exam: Vital Signs Blood pressure 97/73, pulse 86, temperature 98 F (36.7 C), temperature source Oral, resp. rate 16, height 5\' 4"  (1.626 m), weight 45.9 kg, SpO2 97 %.    General: lethargic/sleeping- woke briefly- nodded head; NAD HENT: conjugate gaze; oropharynx moist; cortrak in place CV: regular rate; no  JVD Pulmonary: coarse, some rhonchi; no wheezes or rales; adequate air movement- congested cough GI: soft, NT, ND, (+)BS Psychiatric: very flat Neurological: sleepy  Musculoskeletal:        General: No swelling. Normal range of motion.    Cervical back: Normal range of motion. Skin:    General: Skin is warm. Neurological:    Mental Status: He is alert.    Comments: Patient is alert.  With assistance of video interpreter he does follow simple commands.  Provides year in person but decreased awareness and insight as a whole  Patient is dysphonic. Moves all 4 limbs. Senses pain.      Assessment/Plan: 1. Functional deficits which require 3+ hours per day of interdisciplinary therapy in a comprehensive inpatient rehab setting. Physiatrist is providing close team supervision and 24 hour management of active medical problems listed below. Physiatrist and rehab team continue to assess barriers to discharge/monitor patient progress toward functional and medical goals  Care Tool:  Bathing              Bathing assist       Upper Body Dressing/Undressing Upper body dressing   What is the patient wearing?: Pull over shirt    Upper body assist Assist Level: Minimal Assistance - Patient > 75%    Lower Body Dressing/Undressing Lower body dressing      What is the patient wearing?: Incontinence brief, Pants     Lower body assist Assist for lower body dressing: Minimal Assistance - Patient > 75%     Toileting Toileting    Toileting assist       Transfers Chair/bed transfer  Transfers assist     Chair/bed transfer assist level: Minimal Assistance - Patient > 75%     Locomotion Ambulation   Ambulation assist              Walk 10 feet activity   Assist           Walk 50 feet activity   Assist           Walk 150 feet activity   Assist           Walk 10 feet on uneven surface  activity   Assist            Wheelchair     Assist               Wheelchair 50 feet with 2 turns activity    Assist            Wheelchair 150 feet activity     Assist          Blood pressure 97/73, pulse 86, temperature 98 F (36.7 C), temperature source Oral, resp. rate 16, height  5\' 4"  (1.626 m), weight 45.9 kg, SpO2 97 %.  Medical Problem List and Plan: 1.  Debility with altered mental status secondary to CAD/anterior STEMI /cardiogenic shock with occluded proximal LAD status post stenting post subacute left frontal infarct             -patient may shower             -ELOS/Goals: 16-20 days, Mod I with PT/OT, supervision SLP  -first day of evaluations PT, OT, and SLP 2.  Antithrombotics: -DVT/anticoagulation: Lovenox             -antiplatelet therapy: Aspirin 81 mg daily, Brilinta 90 mg twice daily 3. Pain Management: Oxycodone as needed 4. Mood: Klonopin 0.5 mg 3 times daily             -antipsychotic agents: N/A 5. Neuropsych: This patient is not capable of making decisions on his own behalf.             -is fall risk, safety plan needed 6. Skin/Wound Care: Routine skin checks 7. Fluids/Electrolytes/Nutrition: Routine in and outs with follow-up chemistries on admit. 8.  Acute hypoxemic respiratory failure.  Patient extubated.  Suspect HCAP completing 7-day course of antibiotic therapy 9.  Right pleural effusion.  Status postthoracentesis 08/01/2020 with 450 cc fluid removal.  Follow-up chest x-ray 08/02/2020 without effusion 10.  Dysphagia.  NPO. NGT feedings currently -f/u MBS, plan per SLP 11.  Hypertension.  Coreg 3.125 mg twice daily, Lanoxin 0.125 mg daily 12.  Hyperlipidemia.  Crestor 13.  New findings diabetes mellitus.  Hemoglobin A1c 6.5.  SSI.  NovoLog 3 units every 4 hours.  Diabetic teaching 14.  Tobacco abuse.  NicoDerm patch.  Counseling  15. Vascular congestion/coughing  6/11- will try Albuterol nebs q4 hours prn; flutter valve if possible and give 1 dose of Lasix  40 mg- CXR showed vular congestion- nothing else.     I spent total of 35 minutes on visit- >50% coordination of care- talking with nurse x3; reviewing CXR and making plan for resp Sx's.    LOS: 1 days A FACE TO FACE EVALUATION WAS PERFORMED  Bawi Lakins 08/11/2020, 2:53 PM

## 2020-08-11 NOTE — Plan of Care (Signed)
  Problem: Consults Goal: RH STROKE PATIENT EDUCATION Description: See Patient Education module for education specifics  Outcome: Progressing   Problem: RH BOWEL ELIMINATION Goal: RH STG MANAGE BOWEL WITH ASSISTANCE Description: STG Manage Bowel with min  Assistance. Outcome: Progressing   Problem: RH BLADDER ELIMINATION Goal: RH STG MANAGE BLADDER WITH ASSISTANCE Description: STG Manage Bladder With min Assistance Outcome: Progressing   Problem: RH SAFETY Goal: RH STG ADHERE TO SAFETY PRECAUTIONS W/ASSISTANCE/DEVICE Description: STG Adhere to Safety Precautions With cues/reminders Assistance/Device. Outcome: Progressing   Problem: RH PAIN MANAGEMENT Goal: RH STG PAIN MANAGED AT OR BELOW PT'S PAIN GOAL Description: At or below level 4 Outcome: Progressing

## 2020-08-11 NOTE — Evaluation (Signed)
Occupational Therapy Assessment and Plan  Patient Details  Name: Marguerite Jarboe MRN: [id number] Date of Birth: [birth year]  OT Diagnosis: muscle weakness (generalized) Rehab Potential: Rehab Potential (ACUTE ONLY): Good ELOS: 14-17 days   Today's Date: 08/11/2020 OT Individual Time: 1005-1105 OT Individual Time Calculation (min): 60 min     Hospital Problem: Principal Problem:   CVA (cerebral vascular accident) [facility])   Past Medical History:  Past Medical History:  Diagnosis Date   Cardiac arrest ([place])    Coronary artery disease    Dysrhythmia    Myocardial infarction [facility])    Past Surgical History:  Past Surgical History:  Procedure Laterality Date   CARDIAC CATHETERIZATION     CORONARY/GRAFT ACUTE MI REVASCULARIZATION N/A 07/21/2020   Procedure: Coronary/Graft Acute MI Revascularization;  Surgeon: Martinique, Peter M, MD;  Location: [place] [facility];  Service: Cardiovascular;  Laterality: N/A;   LEFT HEART CATH AND CORONARY ANGIOGRAPHY N/A 07/21/2020   Procedure: LEFT HEART CATH AND CORONARY ANGIOGRAPHY;  Surgeon: Martinique, Peter M, MD;  Location: [place] [facility];  Service: Cardiovascular;  Laterality: N/A;   RIGHT HEART CATH N/A 07/21/2020   Procedure: RIGHT HEART CATH;  Surgeon: Martinique, Peter M, MD;  Location: [place] [facility];  Service: Cardiovascular;  Laterality: N/A;    Assessment & Plan Clinical Impression: Patient is a 65 y.o. year old right-handed non-English-speaking [place] male with unknown medical history on no prescription medications.  Per chart review patient lives with her 3 daughters.  Independent prior to admission.  1 level home 3 steps to entry.  Presented 07/21/2020 after being found unresponsive by family.  EMS arrival patient pulseless and apneic.  Initially in V. fib total of 5 shocks, 3 epis, 450 mg amiodarone 15 minutes of CPR.  Patient agitated and hypotensive upon arrival to the ED received Versed.  Admission chemistry glucose 275, creatinine 1.40, AST 328,  ALT 251, hemoglobin A1c 6.5, troponin 2747, WBC 13,200, lactic acid 3.7.  EKG showing ST segment changes in anterior lateral leads and underwent emergent cardiac catheterization requiring LAD stenting.  Echocardiogram ejection fraction of 30 to 35% left ventricle moderately decreased function.  Cranial CT scan negative for cerebral edema.  No evidence of anoxic brain injury.  Hypodensity in the deep white matter in the frontal lobes bilaterally likely due to chronic microvascular ischemia.  She did require vasopressors for hypotension.  Hospital course respiratory failure intubation treated for right side aspiration pneumonia as well as patient developed right pleural effusion undergoing thoracentesis 08/01/2020 with 450 cc fluid removal.  Follow-up chest x-ray 08/02/2020 without effusion...  EEG negative for seizure but did suggest severe diffuse encephalopathy.  Patient remains on low-dose aspirin therapy per cardiology services as well as Brilinta.  Subcutaneous Lovenox for DVT prophylaxis.  Currently n.p.o. with alternative means of nutritional support.  MRI of the brain completed 08/07/2020 due to persistent altered mental status changes post cardiac arrest showing punctate acute to subacute left frontal infarct.  No evidence of hypoxic ischemic injury.  Patient remained on low-dose aspirin/Brilinta therapy for both CVA prophylaxis as well as cardiac.  Palliative care consulted to establish goals of care.  Therapy evaluations completed due to patient's altered mental status cardiac arrest was admitted for a comprehensive rehab program. Patient transferred to CIR on 08/10/2020 .  Patient currently requires min with basic self-care skills secondary to muscle weakness, decreased cardiorespiratoy endurance, decreased attention, decreased awareness, decreased problem solving, decreased safety awareness, and decreased memory, and decreased standing balance, decreased postural  control, and decreased balance strategies.   Prior to hospitalization, patient could complete BADLs with independent .  Patient will benefit from skilled intervention to increase independence with basic self-care skills prior to discharge home with care partner.  Anticipate patient will require 24 hour supervision and no further OT follow recommended.  OT - End of Session Activity Tolerance: Decreased this session Endurance Deficit: Yes OT Assessment Rehab Potential (ACUTE ONLY): Good OT Patient demonstrates impairments in the following area(s): Balance;Safety;Behavior;Sensory;Cognition;Endurance;Motor OT Basic ADL's Functional Problem(s): Grooming;Bathing;Dressing;Toileting OT Transfers Functional Problem(s): Toilet;Tub/Shower OT Additional Impairment(s): None OT Plan OT Intensity: Minimum of 1-2 x/day, 45 to 90 minutes OT Frequency: 5 out of 7 days OT Duration/Estimated Length of Stay: 14-17 days OT Treatment/Interventions: Balance/vestibular training;Disease mangement/prevention;Neuromuscular re-education;Self Care/advanced ADL retraining;Cognitive remediation/compensation;DME/adaptive equipment instruction;Pain management;Skin care/wound managment;Community reintegration;Functional electrical stimulation;Patient/family education;Splinting/orthotics;Discharge planning;Functional mobility training;Psychosocial support;Therapeutic Activities;Therapeutic Exercise;UE/LE Strength taining/ROM;Visual/perceptual remediation/compensation;UE/LE Coordination activities OT Self Feeding Anticipated Outcome(s): N/A OT Basic Self-Care Anticipated Outcome(s): Supervision due to cognitive impairments OT Toileting Anticipated Outcome(s): Supervision due to cognitive impairments OT Bathroom Transfers Anticipated Outcome(s): Supervision due to cognitive impairments OT Recommendation Patient destination: Home Follow Up Recommendations: None Equipment Recommended: To be determined   OT Evaluation Precautions/Restrictions  Precautions Precautions:  Fall Restrictions Weight Bearing Restrictions: No General Chart Reviewed: Yes Vital Signs Oxygen Therapy SpO2: 98 % O2 Device: Room Air Pain Pain Assessment Pain Scale: 0-10 Pain Score: 0-No pain Home Living/Prior Functioning Home Living Family/patient expects to be discharged to:: Private residence Living Arrangements: Children Available Help at Discharge: Family Type of Home: House Home Access: Stairs to enter Technical brewer of Steps: 3 Home Layout: One level Bathroom Shower/Tub: Chiropodist: Standard Additional Comments: Home setup retrieve from chart review Vision Baseline Vision/History: No visual deficits Patient Visual Report: No change from baseline Vision Assessment?: No apparent visual deficits Perception  Perception: Within Functional Limits Praxis Praxis: Intact Cognition Overall Cognitive Status: Impaired/Different from baseline Arousal/Alertness: Awake/alert Orientation Level: Person;Place (Language barrier with interpreter unable hear pt d/t low tone of voice.) Immediate Memory Recall:  (Unable to complete due to language barrier and interpreter unable to hear pt.) Attention: Sustained Sustained Attention: Appears intact Awareness: Impaired Awareness Impairment: Anticipatory impairment;Emergent impairment;Intellectual impairment Safety/Judgment: Impaired Comments: Unable to complete true cognitive assessment due to language barrier and interpreter unable to hear pt. Pt aware he was in hospital and repeatedly requesting to return home. Sensation Sensation Light Touch: Appears Intact Coordination Gross Motor Movements are Fluid and Coordinated: No Fine Motor Movements are Fluid and Coordinated: Yes Finger Nose Finger Test: [facility] Motor  Motor Motor - Skilled Clinical Observations: Decreased balance and postural reactions  Trunk/Postural Assessment  Cervical Assessment Cervical Assessment: Within Functional Limits Thoracic  Assessment Thoracic Assessment: Within Functional Limits Lumbar Assessment Lumbar Assessment: Within Functional Limits Postural Control Postural Control: Deficits on evaluation  Balance Balance Balance Assessed: Yes Dynamic Sitting Balance Dynamic Sitting - Balance Support: Feet supported;During functional activity Dynamic Sitting - Level of Assistance: 5: Stand by assistance Static Standing Balance Static Standing - Balance Support: Bilateral upper extremity supported Static Standing - Level of Assistance: 4: Min assist Dynamic Standing Balance Dynamic Standing - Balance Support: During functional activity Dynamic Standing - Level of Assistance: 4: Min assist Dynamic Standing - Balance Activities: Other (comment) Dynamic Standing - Comments: Pulling pants up in standing Extremity/Trunk Assessment RUE Assessment RUE Assessment: Within Functional Limits General Strength Comments: 4/5 LUE Assessment LUE Assessment: Within Functional Limits General Strength Comments: 4/5  Care Tool Care Tool Self Care  Eating   Eating Assist Level: Dependent - Patient 0%    Oral Care    Oral Care Assist Level: Total assistance - Patient < 25%    Bathing              Upper Body Dressing(including orthotics)   What is the patient wearing?: Pull over shirt   Assist Level: Minimal Assistance - Patient > 75%    Lower Body Dressing (excluding footwear)   What is the patient wearing?: Incontinence brief;Pants Assist for lower body dressing: Minimal Assistance - Patient > 75%    Putting on/Taking off footwear   What is the patient wearing?: [place] for footwear: Total Assistance - Patient < 25%       Care Tool Toileting Toileting activity         Care Tool Bed Mobility Roll left and right activity   Roll left and right assist level: Independent    Sit to lying activity        Lying to sitting edge of bed activity   Lying to sitting edge of bed assist level:  Supervision/Verbal cueing     Care Tool Transfers Sit to stand transfer   Sit to stand assist level: Minimal Assistance - Patient > 75%    Chair/bed transfer   Chair/bed transfer assist level: Minimal Assistance - Patient > 75%     Toilet transfer         Care Tool Cognition Expression of Ideas and Wants Expression of Ideas and Wants: Some difficulty - exhibits some difficulty with expressing needs and ideas (e.g, some words or finishing thoughts) or speech is not clear   Understanding Verbal and Non-Verbal Content Understanding Verbal and Non-Verbal Content: Sometimes understands - understands only basic conversations or simple, direct phrases. Frequently requires cues to understand   Memory/Recall Ability *first 3 days only Memory/Recall Ability *first 3 days only: That he or she is in a hospital/hospital unit    Refer to Care Plan for [place] [facility] OT Short Term Goal 1 (Week 1): Pt will following 2-step directions during functional ADL tasks with min prompting. OT Short Term Goal 2 (Week 1): Pt will complete toileting task with CGA. OT Short Term Goal 3 (Week 1): Pt will complete LB dressing with CGA. OT Short Term Goal 4 (Week 1): Pt will complete functional transfers during ADLs with CGA.  Recommendations for other services: None    Skilled Therapeutic Intervention OT evaluation completed using virtual translator, however translator unable to understand/hear pt due to low, wet sound voice. Pt completed stand pivot transfers with min A and cues for safety due impulsivity. Pt able to follow simple directions during dressing tasks, completing with min A. Pt declined toileting and bathing on eval, however OT educated on role of OT and goals. Pt repeatedly requesting to go home towards end of eval, however able to be redirected and educated on rehab process. OT unable to complete full cognitive screen or BIMS due to language barrier. At end of eval, pt left  sitting in w/c with speech therapist present.   ADL   Mobility  Bed Mobility Bed Mobility: Supine to Sit Supine to Sit: Supervision/Verbal cueing Transfers Sit to Stand: Minimal Assistance - Patient > 75% Stand to Sit: Minimal Assistance - Patient > 75%   Discharge Criteria: Patient will be discharged from OT if patient refuses treatment 3 consecutive times without medical reason, if treatment goals not met, if there is a  change in medical status, if patient makes no progress towards goals or if patient is discharged from hospital.  The above assessment, treatment plan, treatment alternatives and goals were discussed and mutually agreed upon: by patient  Duayne Cal 08/11/2020, 12:53 PM

## 2020-08-11 NOTE — Progress Notes (Signed)
Attempted flutter valve teaching. When using interpreter patient had difficulty understanding how to use the device. Continue to reinforce education.

## 2020-08-11 NOTE — Evaluation (Signed)
Physical Therapy Assessment and Plan  Patient Details  Name: Vincent Horton MRN: [id number] Date of Birth: 06-02-1955  PT Diagnosis: Abnormality of gait, Difficulty walking, Impaired cognition, and Muscle weakness Rehab Potential: Good ELOS: ~1.5-2 weeks   Today's Date: 08/11/2020 PT Individual Time: 1305-1403 PT Individual Time Calculation (min): 58 min    Hospital Problem: Principal Problem:   CVA (cerebral vascular accident) [facility])   Past Medical History:  Past Medical History:  Diagnosis Date   Cardiac arrest ([place])    Coronary artery disease    Dysrhythmia    Myocardial infarction [facility])    Past Surgical History:  Past Surgical History:  Procedure Laterality Date   CARDIAC CATHETERIZATION     CORONARY/GRAFT ACUTE MI REVASCULARIZATION N/A 07/21/2020   Procedure: Coronary/Graft Acute MI Revascularization;  Surgeon: Martinique, Peter M, MD;  Location: [place] CV LAB;  Service: Cardiovascular;  Laterality: N/A;   LEFT HEART CATH AND CORONARY ANGIOGRAPHY N/A 07/21/2020   Procedure: LEFT HEART CATH AND CORONARY ANGIOGRAPHY;  Surgeon: Martinique, Peter M, MD;  Location: [place] [facility];  Service: Cardiovascular;  Laterality: N/A;   RIGHT HEART CATH N/A 07/21/2020   Procedure: RIGHT HEART CATH;  Surgeon: Martinique, Peter M, MD;  Location: [place] [facility];  Service: Cardiovascular;  Laterality: N/A;    Assessment & Plan Clinical Impression: Patient is a 65 y.o. year old right-handed non-English-speaking [place] male with unknown medical history on no prescription medications.  Per chart review patient lives with her 3 daughters.  Independent prior to admission.  1 level home 3 steps to entry.  Presented 07/21/2020 after being found unresponsive by family.  EMS arrival patient pulseless and apneic.  Initially in V. fib total of 5 shocks, 3 epis, 450 mg amiodarone 15 minutes of CPR.  Patient agitated and hypotensive upon arrival to the ED received Versed.  Admission chemistry glucose 275, creatinine  1.40, AST 328, ALT 251, hemoglobin A1c 6.5, troponin 2747, WBC 13,200, lactic acid 3.7.  EKG showing ST segment changes in anterior lateral leads and underwent emergent cardiac catheterization requiring LAD stenting.  Echocardiogram ejection fraction of 30 to 35% left ventricle moderately decreased function.  Cranial CT scan negative for cerebral edema.  No evidence of anoxic brain injury.  Hypodensity in the deep white matter in the frontal lobes bilaterally likely due to chronic microvascular ischemia.  She did require vasopressors for hypotension.  Hospital course respiratory failure intubation treated for right side aspiration pneumonia as well as patient developed right pleural effusion undergoing thoracentesis 08/01/2020 with 450 cc fluid removal.  Follow-up chest x-ray 08/02/2020 without effusion...  EEG negative for seizure but did suggest severe diffuse encephalopathy.  Patient remains on low-dose aspirin therapy per cardiology services as well as Brilinta.  Subcutaneous Lovenox for DVT prophylaxis.  Currently n.p.o. with alternative means of nutritional support.  MRI of the brain completed 08/07/2020 due to persistent altered mental status changes post cardiac arrest showing punctate acute to subacute left frontal infarct.  No evidence of hypoxic ischemic injury.  Patient remained on low-dose aspirin/Brilinta therapy for both CVA prophylaxis as well as cardiac.  Palliative care consulted to establish goals of care.  Therapy evaluations completed due to patient's altered mental status cardiac arrest was admitted for a comprehensive rehab program. Patient transferred to CIR on 08/10/2020 .   Patient currently requires min assist with mobility secondary to muscle weakness, decreased cardiorespiratoy endurance, impaired timing and sequencing and unbalanced muscle activation, decreased attention, decreased awareness, decreased problem solving, decreased safety  awareness, and delayed processing, and decreased sitting  balance, decreased standing balance, decreased postural control, and decreased balance strategies.  Prior to hospitalization, patient was independent  with mobility and lived with Family in a House home.  Home access is 3Stairs to enter.  Patient will benefit from skilled PT intervention to maximize safe functional mobility, minimize fall risk, and decrease caregiver burden for planned discharge home with 24 hour supervision.  Anticipate patient will benefit from follow up OP at discharge.  PT - End of Session Activity Tolerance: Tolerates 30+ min activity with multiple rests Endurance Deficit: Yes Endurance Deficit Description: requires seated rest break PT Assessment Rehab Potential (ACUTE/IP ONLY): Good PT Barriers to Discharge: Home environment access/layout;Lack of/limited family support PT Patient demonstrates impairments in the following area(s): Balance;Safety;Behavior;Sensory;Edema;Skin Integrity;Endurance;Motor;Nutrition;Pain;Perception PT Transfers Functional Problem(s): Bed Mobility;Bed to Chair;Car;Furniture;Floor PT Locomotion Functional Problem(s): Ambulation;Stairs PT Plan PT Intensity: Minimum of 1-2 x/day ,45 to 90 minutes PT Frequency: 5 out of 7 days PT Duration Estimated Length of Stay: ~1.5-2 weeks PT Treatment/Interventions: Ambulation/gait training;Community reintegration;DME/adaptive equipment instruction;Neuromuscular re-education;Psychosocial support;Stair training;Discharge planning;Functional electrical stimulation;Balance/vestibular training;UE/LE Strength taining/ROM;Pain management;Skin care/wound management;Therapeutic Activities;UE/LE Coordination activities;Cognitive remediation/compensation;Disease management/prevention;Functional mobility training;Patient/family education;Therapeutic Exercise;Visual/perceptual remediation/compensation;Splinting/orthotics PT Transfers Anticipated Outcome(s): supervision using LRAD PT Locomotion Anticipated Outcome(s):  supervision using LRAD PT Recommendation Follow Up Recommendations: Outpatient PT;24 hour supervision/assistance Patient destination: Home Equipment Recommended: To be determined   PT Evaluation Precautions/Restrictions Precautions Precautions: Fall;Other (comment) Precaution Comments: cortrak, slightly impulsive Restrictions Weight Bearing Restrictions: No Pain Pain Assessment Pain Scale: 0-10 Pain Score: 0-No pain Home Living/Prior Functioning Home Living Available Help at Discharge: Family Type of Home: House Home Access: Stairs to enter Technical brewer of Steps: 3 Home Layout: One level Bathroom Shower/Tub: Chiropodist: Standard Additional Comments: Home setup retrieve from chart review  Lives With: Family Prior Function Level of Independence: Independent with gait;Independent with homemaking with ambulation;Independent with transfers  Able to Take Stairs?: Yes Comments: does not work, but drives, walks without DME and enjoys community mobility, also enjoys fixing things like watches - hx obtained from chart review as interpter unable to hear pt and pt unable to respond to therapist in [place] Perception  Perception Perception: Impaired Body Part Identification: impaired identificaiton of R vs L Praxis Praxis: Intact  Cognition  Overall Cognitive Status: No family/caregiver present to determine baseline cognitive functioning Arousal/Alertness: Awake/alert Orientation Level: Oriented to person;Oriented to place;Disoriented to situation;Oriented to time (when asked about situation pt just states "I was sick before" ; states it is June 2022) Attention: Focused Focused Attention: Appears intact Sustained Attention: Impaired Sustained Attention Impairment: Verbal basic Awareness: Impaired Problem Solving: Impaired Behaviors: Impulsive Safety/Judgment: Impaired Comments: Unable to complete true cognitive assessment due to language barrier  and interpreter unable to hear pt. Pt aware he was in hospital and repeatedly requesting to return home. Sensation Sensation Light Touch: Appears Intact (pt with some inconsistent responses making it difficult to determine fully) Hot/Cold: Not tested Proprioception: Impaired by gross assessment Stereognosis: Not tested Coordination Gross Motor Movements are Fluid and Coordinated: No Coordination and Movement Description: impaired in B LE during functional mobility tasks Heel Shin Test: unable to complete due to difficulty understanding instructions Motor  Motor Motor: Other (comment);Abnormal postural alignment and control Motor - Skilled Clinical Observations: Decreased balance and postural reactions, generalized weakness   Trunk/Postural Assessment  Cervical Assessment Cervical Assessment: Within Functional Limits Thoracic Assessment Thoracic Assessment: Within Functional Limits Lumbar Assessment Lumbar Assessment: Exceptions to [facility] (posterior pelvic rotation in sitting) Postural  Control Postural Control: Deficits on evaluation Righting Reactions: delayed and inadequate Postural Limitations: decreased  Balance Balance Balance Assessed: Yes Dynamic Sitting Balance Dynamic Sitting - Balance Support: Feet supported;During functional activity Dynamic Sitting - Level of Assistance: Other (comment);5: Stand by assistance (CGA) Static Standing Balance Static Standing - Balance Support: During functional activity;No upper extremity supported Static Standing - Level of Assistance: 4: Min assist Dynamic Standing Balance Dynamic Standing - Balance Support: During functional activity;No upper extremity supported Dynamic Standing - Level of Assistance: 3: Mod assist Extremity Assessment      RLE Assessment RLE Assessment: Exceptions to [facility] Active Range of Motion (AROM) Comments: WFL General Strength Comments: pt with difficulty following commands for formal MMT therefore assessed  during functional mobility to be grossly 3+/5 to 4/5 LLE Assessment Active Range of Motion (AROM) Comments: [facility] General Strength Comments: pt with difficulty following commands for formal MMT therefore assessed during functional mobility to be grossly 3+/5 to 4/5  Care Tool Care Tool Bed Mobility Roll left and right activity   Roll left and right assist level: Independent    Sit to lying activity   Sit to lying assist level: Supervision/Verbal cueing    Lying to sitting edge of bed activity   Lying to sitting edge of bed assist level: Supervision/Verbal cueing     Care Tool Transfers Sit to stand transfer   Sit to stand assist level: Minimal Assistance - Patient > 75%    Chair/bed transfer   Chair/bed transfer assist level: Minimal Assistance - Patient > 75%     Physiological scientist transfer assist level: Minimal Assistance - Patient > 75%      Care Tool Locomotion Ambulation   Assist level: Minimal Assistance - Patient > 75% Assistive device: Hand held assist Max distance: 68f  Walk 10 feet activity   Assist level: Minimal Assistance - Patient > 75% Assistive device: Hand held assist   Walk 50 feet with 2 turns activity   Assist level: Minimal Assistance - Patient > 75% Assistive device: Hand held assist  Walk 150 feet activity Walk 150 feet activity did not occur: Safety/medical concerns      Walk 10 feet on uneven surfaces activity   Assist level: Moderate Assistance - Patient - 50 - 74% Assistive device: Other (comment);Hand held assist (railing)  Stairs   Assist level: Minimal Assistance - Patient > 75%  Stairs assistive device: 2 hand rails Max number of stairs: 12  Walk up/down 1 step activity   Walk up/down 1 step (curb) assist level: Minimal Assistance - Patient > 75% Walk up/down 1 step or curb assistive device: 2 hand rails    Walk up/down 4 steps activity Walk up/down 4 steps assist level: Minimal Assistance - Patient >  75% Walk up/down 4 steps assistive device: 2 hand rails  Walk up/down 12 steps activity   Walk up/down 12 steps assist level: Minimal Assistance - Patient > 75% Walk up/down 12 steps assistive device: 2 hand rails  Pick up small objects from floor   Pick up small object from the floor assist level: Moderate Assistance - Patient 50 - 74%    Wheelchair Will patient use wheelchair at discharge?: No          Wheel 50 feet with 2 turns activity      Wheel 150 feet activity        Refer to Care Plan for Long Term Goals  SHORT TERM GOAL WEEK 1 PT Short Term Goal 1 (Week 1): Pt will perform sit<>stands using LRAD with CGA PT Short Term Goal 2 (Week 1): Pt will perform bed<>chair transfers using LRAD with CGA PT Short Term Goal 3 (Week 1): Pt will ambulate at least 19f using LRAD with CGA PT Short Term Goal 4 (Week 1): Pt will ascend/descend 4 steps using HRs per home set-up with CGA  Recommendations for other services: None   Skilled Therapeutic Intervention Pt received asleep in bed and upon awakening agreeable to therapy session. Ipad interpreter, Taelor #[phone] used throughout session. Evaluation completed (see details above) with patient education regarding purpose of PT evaluation, PT POC and goals, therapy schedule, weekly team meetings, and other CIR information including safety plan and fall risk safety. Pt noted to have a very wet voice with frequent congested sounding cough (RN aware) - pt also noted to be holding saliva in his mouth - therapist educated pt on spitting it out if he could not safely swallow it to prevent it from going down his trachea. Educated pt on use of flutter valve and had pt perform x10 reps at beginning and end of session. Pt completed the below mobility tasks with the specified levels of assistance. No AD used throughout session. During 1st bout of gait training pt demod very short narrow steps with slight B LE knees flexed throughout gait with increased  postural sway - during 2nd trial pt demos improving step length and improving upright posture. During stair navigation using BHRs pt self-selected reciprocal stepping pattern demoing weakness during descent with pt coming down partly sideways towards L. At end of session pt requests to return to bed - left with needs in reach, bed alarm on, telesitter in place, and NT present to assume care of patient.  Mobility Bed Mobility Bed Mobility: Sit to Supine;Supine to Sit Supine to Sit: Supervision/Verbal cueing Sit to Supine: Supervision/Verbal cueing Transfers Transfers: Sit to Stand;Stand to Sit;Stand Pivot Transfers Sit to Stand: Minimal Assistance - Patient > 75% Stand to Sit: Minimal Assistance - Patient > 75% Stand Pivot Transfers: Minimal Assistance - Patient > 75% Stand Pivot Transfer Details: Verbal cues for gait pattern;Verbal cues for technique;Verbal cues for sequencing;Tactile cues for weight shifting;Tactile cues for sequencing;Manual facilitation for weight shifting;Tactile cues for initiation Transfer (Assistive device): None Locomotion  Gait Ambulation: Yes Gait Assistance: Minimal Assistance - Patient > 75% Gait Distance (Feet): 78 Feet (and 1497f Assistive device: 1 person hand held assist Gait Assistance Details: Tactile cues for initiation;Tactile cues for posture;Tactile cues for sequencing;Tactile cues for weight shifting;Manual facilitation for weight shifting;Verbal cues for gait pattern;Verbal cues for sequencing;Verbal cues for technique Gait Assistance Details[facility] Gait Gait: Yes Gait Pattern: Impaired Gait Pattern: Poor foot clearance - left;Poor foot clearance - right;Narrow base of support;Decreased step length - left;Decreased step length - right;Step-through pattern Gait velocity: Decreased Stairs / Additional Locomotion Stairs: Yes Stairs Assistance: Minimal Assistance - Patient > 75% Stair Management Technique: Two rails Number  of Stairs: 12 Height of Stairs: 6 Wheelchair Mobility Wheelchair Mobility: No   Discharge Criteria: Patient will be discharged from PT if patient refuses treatment 3 consecutive times without medical reason, if treatment goals not met, if there is a change in medical status, if patient makes no progress towards goals or if patient is discharged from hospital.  The above assessment, treatment plan, treatment alternatives and goals were discussed and mutually agreed upon: by patient  CaKara Horton  Vincent Horton , PT, DPT, CSRS 08/11/2020, 12:27 PM

## 2020-08-12 DIAGNOSIS — I6300 Cerebral infarction due to thrombosis of unspecified precerebral artery: Secondary | ICD-10-CM

## 2020-08-12 LAB — GLUCOSE, CAPILLARY
Glucose-Capillary: 106 mg/dL — ABNORMAL HIGH (ref 70–99)
Glucose-Capillary: 131 mg/dL — ABNORMAL HIGH (ref 70–99)
Glucose-Capillary: 138 mg/dL — ABNORMAL HIGH (ref 70–99)
Glucose-Capillary: 151 mg/dL — ABNORMAL HIGH (ref 70–99)
Glucose-Capillary: 158 mg/dL — ABNORMAL HIGH (ref 70–99)
Glucose-Capillary: 158 mg/dL — ABNORMAL HIGH (ref 70–99)
Glucose-Capillary: 161 mg/dL — ABNORMAL HIGH (ref 70–99)
Glucose-Capillary: 205 mg/dL — ABNORMAL HIGH (ref 70–99)

## 2020-08-12 NOTE — Progress Notes (Signed)
Physical Therapy Session Note  Patient Details  Name: Vincent Horton MRN: [id number] Date of Birth: [birth year]  Today's Date: 08/12/2020 PT Individual Time: 0906-1005 PT Individual Time Calculation (min): 59 min   Short Term Goals: Week 1:  PT Short Term Goal 1 (Week 1): Pt will perform sit<>stands using LRAD with CGA PT Short Term Goal 2 (Week 1): Pt will perform bed<>chair transfers using LRAD with CGA PT Short Term Goal 3 (Week 1): Pt will ambulate at least 16ft using LRAD with CGA PT Short Term Goal 4 (Week 1): Pt will ascend/descend 4 steps using HRs per home set-up with CGA  Skilled Therapeutic Interventions/Progress Updates:  Chart reviewed. Pt found sleeping in bed on 2L Oc via [place] and required interpreter services. RN also aided line management to prepare patient for session. Once awake and alert, pt was agreeable to PT.   Session focused on safe RW management, weaning supplemental O2, standing endurance, pre-gait and ambulation activities to promote household access. Pt transferred to [facility] from EOB using squat pivot transfer and MinA + no AD. O2 was weaned to 1L with SpO2 at 99%. Pt then completed 3x 20 reps of marching with CGA and B bed rails, increasing tempo with each set. O2 was weaned to RA with SpO2 at 99%. Pt then completed 6 rounds of 73ft-70ft ambulation with multiple turns to practice RW management. Pt required CGA + RW on straight pathways and MinA + RW on turn, including strong vc/tc to maintain safe RW placement. Pt then practiced standing balance with no AD, but was unable to stand without HHA + CGA at minimum.   PT and RN cleared pt to remain on RA at end of session. SpO2 remained 99% at end of session. Pt was left seated in [facility] with alarm engaged, call bell and all needs in reach.  Therapy Documentation Precautions:  Precautions Precautions: Fall, Other (comment) Precaution Comments: cortrak, slightly impulsive Restrictions Weight Bearing Restrictions: No    Pain:   0-10: No pain 0/10   Therapy/Group: Individual Therapy  Perrin Maltese, PT, DPT 08/12/2020, 12:44 PM

## 2020-08-12 NOTE — Progress Notes (Signed)
PROGRESS NOTE   Subjective/Complaints:   Pt sleeping with blanket over head.  Per nurse, had mucus plug last night - once came up/suctioned- Stopped coughing.    ROS: Limited due to language/cognition   Objective:   DG CHEST PORT 1 VIEW  Result Date: 08/11/2020 CLINICAL DATA:  Coughing.  CVA. EXAM: PORTABLE CHEST 1 VIEW COMPARISON:  08/02/2020 FINDINGS: Lung apices are cut off the film. Enteric tube courses through the stomach and off the film as tip is not visualized. Lungs are adequately inflated with subtle hazy prominence of the infrahilar markings. No lobar consolidation or effusion. Coronary stent unchanged. Cardiomediastinal silhouette and remainder of the exam is unchanged. IMPRESSION: Suggestion of minimal vascular congestion. Electronically Signed   By: Elberta Fortis M.D.   On: 08/11/2020 10:38   ECHOCARDIOGRAM COMPLETE  Result Date: 08/10/2020    ECHOCARDIOGRAM REPORT   Patient Name:   JARRETT ALBOR Date of Exam: 08/10/2020 Medical Rec #:  [id number]  Height:       64.0 in Accession #:    [id number] Weight:       99.9 lb Date of Birth:  15-Jun-1955   BSA:          1.456 m Patient Age:    65 years   BP:           95/72 mmHg Patient Gender: M          HR:           86 bpm. Exam Location:  Inpatient Procedure: 2D Echo, 3D Echo, Intracardiac Opacification Agent, Color Doppler and            Cardiac Doppler Indications:    Stroke.  History:        Patient has prior history of Echocardiogram examinations, most                 recent 07/21/2020. Cardiomyopathy, Previous Myocardial Infarction                 and CAD, Arrythmias:Cardiac Arrest; Signs/Symptoms:Dyspnea.                 Cardiac shock.  Sonographer:    Sheralyn Boatman RDCS Referring Phys: [id number] [facility]  Sonographer Comments: Technically difficult study due to poor echo windows. IMPRESSIONS  1. Left ventricular ejection fraction, by estimation, is 35 to 40%. The left ventricle has  moderately decreased function. The left ventricle demonstrates regional wall motion abnormalities (see scoring diagram/findings for description). There is mild concentric left ventricular hypertrophy. Left ventricular diastolic parameters are indeterminate. There is akinesis of the left ventricular, apical segment. There is akinesis of the left ventricular, apical septal wall and anterior wall. There is akinesis of the left ventricular, apical segment. No obvious LV thrombus is present but there is swirling of definity contrast in the apex consistent with sluggish flow. Cannot rule out early forming apical thrombus.  2. Right ventricular systolic function is normal. The right ventricular size is normal. There is normal pulmonary artery systolic pressure.  3. The mitral valve is normal in structure. No evidence of mitral valve regurgitation. No evidence of mitral stenosis.  4. The aortic valve is tricuspid.  Aortic valve regurgitation is not visualized. Mild to moderate aortic valve sclerosis/calcification is present, without any evidence of aortic stenosis.  5. The inferior vena cava is normal in size with greater than 50% respiratory variability, suggesting right atrial pressure of 3 mmHg. FINDINGS  Left Ventricle: No obvious LV thrombus is present but there is swirling of definity contrast in the apex consistent with sluggish flow. Cannot rule out early forming apical thrombus. Left ventricular ejection fraction, by estimation, is 35 to 40%. The left ventricle has moderately decreased function. The left ventricle demonstrates regional wall motion abnormalities. Definity contrast agent was given IV to delineate the left ventricular endocardial borders. The left ventricular internal cavity size was normal in size. There is mild concentric left ventricular hypertrophy. Left ventricular diastolic parameters are indeterminate. Right Ventricle: The right ventricular size is normal. No increase in right ventricular wall  thickness. Right ventricular systolic function is normal. There is normal pulmonary artery systolic pressure. The tricuspid regurgitant velocity is 2.38 m/s, and  with an assumed right atrial pressure of 3 mmHg, the estimated right ventricular systolic pressure is 25.7 mmHg. Left Atrium: Left atrial size was normal in size. Right Atrium: Right atrial size was normal in size. Pericardium: There is no evidence of pericardial effusion. Mitral Valve: The mitral valve is normal in structure. No evidence of mitral valve regurgitation. No evidence of mitral valve stenosis. Tricuspid Valve: The tricuspid valve is normal in structure. Tricuspid valve regurgitation is mild . No evidence of tricuspid stenosis. Aortic Valve: The aortic valve is tricuspid. Aortic valve regurgitation is not visualized. Mild to moderate aortic valve sclerosis/calcification is present, without any evidence of aortic stenosis. Pulmonic Valve: The pulmonic valve was normal in structure. Pulmonic valve regurgitation is not visualized. No evidence of pulmonic stenosis. Aorta: The aortic root is normal in size and structure. Venous: The inferior vena cava is normal in size with greater than 50% respiratory variability, suggesting right atrial pressure of 3 mmHg. IAS/Shunts: No atrial level shunt detected by color flow Doppler.  LEFT VENTRICLE PLAX 2D LVIDd:         3.30 cm     Diastology LVIDs:         2.50 cm     LV e' medial:    7.07 cm/s LV IVS:        1.20 cm     LV E/e' medial:  8.9 LVOT diam:     2.10 cm     LV e' lateral:   6.74 cm/s LV SV:         48          LV E/e' lateral: 9.4 LV SV Index:   33 LVOT Area:     3.46 cm  LV Volumes (MOD) LV vol d, MOD A2C: 56.9 ml LV vol d, MOD A4C: 47.8 ml LV vol s, MOD A2C: 34.9 ml LV vol s, MOD A4C: 27.4 ml LV SV MOD A2C:     22.0 ml LV SV MOD A4C:     47.8 ml LV SV MOD BP:      20.1 ml RIGHT VENTRICLE             IVC RV S prime:     12.60 cm/s  IVC diam: 1.80 cm TAPSE (M-mode): 2.0 cm LEFT ATRIUM              Index      RIGHT ATRIUM          Index LA diam:  2.70 cm 1.85 cm/m RA Area:     7.73 cm LA Vol (A2C):   4.2 ml  2.89 ml/m RA Volume:   12.70 ml 8.72 ml/m LA Vol (A4C):   13.2 ml 9.06 ml/m LA Biplane Vol: 7.9 ml  5.44 ml/m  AORTIC VALVE LVOT Vmax:   87.20 cm/s LVOT Vmean:  51.900 cm/s LVOT VTI:    0.138 m  AORTA Ao Root diam: 3.10 cm Ao Asc diam:  3.20 cm MITRAL VALVE               TRICUSPID VALVE MV Area (PHT): 4.54 cm    TR Peak grad:   22.7 mmHg MV Decel Time: 167 msec    TR Vmax:        238.00 cm/s MV E velocity: 63.05 cm/s MV A velocity: 61.95 cm/s  SHUNTS MV E/A ratio:  1.02        Systemic VTI:  0.14 m                            Systemic Diam: 2.10 cm Armanda Magic MD Electronically signed by Armanda Magic MD Signature Date/Time: 08/10/2020/4:27:13 PM    Final    VAS US CAROTID  Result Date: 08/10/2020 Carotid Arterial Duplex Study Patient Name:  ABHAY GODBOLT  Date of Exam:   08/10/2020 Medical Rec #: [id number]   Accession #:    [id number] Date of Birth: 10-03-1955    Patient Gender: M Patient Age:   65Y Exam Location:  [facility] Procedure:      VAS US CAROTID Referring Phys: [id number] Malachi Carl STACK --------------------------------------------------------------------------------  Indications:       CVA. Comparison Study:  No prior studies. Performing Technologist: Jean Rosenthal RDMS,RVT  Examination Guidelines: A complete evaluation includes B-mode imaging, spectral Doppler, color Doppler, and power Doppler as needed of all accessible portions of each vessel. Bilateral testing is considered an integral part of a complete examination. Limited examinations for reoccurring indications may be performed as noted.  Right Carotid Findings: +----------+--------+--------+--------+------------------+------------------+           PSV cm/sEDV cm/sStenosisPlaque DescriptionComments           +----------+--------+--------+--------+------------------+------------------+ CCA Prox  68      19                                                    +----------+--------+--------+--------+------------------+------------------+ CCA Distal97      31                                intimal thickening +----------+--------+--------+--------+------------------+------------------+ ICA Prox  85      27                                                   +----------+--------+--------+--------+------------------+------------------+ ICA Distal77      27                                                   +----------+--------+--------+--------+------------------+------------------+  ECA       105     19                                intimal thickening +----------+--------+--------+--------+------------------+------------------+ +----------+--------+-------+--------+-------------------+           PSV cm/sEDV cmsDescribeArm Pressure (mmHG) +----------+--------+-------+--------+-------------------+ [id number]                                         +----------+--------+-------+--------+-------------------+ +---------+--------+--+--------+--+ VertebralPSV cm/s47EDV cm/s13 +---------+--------+--+--------+--+  Left Carotid Findings: +----------+--------+--------+--------+------------------+------------------+           PSV cm/sEDV cm/sStenosisPlaque DescriptionComments           +----------+--------+--------+--------+------------------+------------------+ CCA Prox  107     30                                intimal thickening +----------+--------+--------+--------+------------------+------------------+ CCA Distal110     33                                intimal thickening +----------+--------+--------+--------+------------------+------------------+ ICA Prox  62      19              mild, heterogenous                   +----------+--------+--------+--------+------------------+------------------+ ICA Distal71      30                                                    +----------+--------+--------+--------+------------------+------------------+ ECA       76      13                                                   +----------+--------+--------+--------+------------------+------------------+ +----------+--------+--------+--------+-------------------+           PSV cm/sEDV cm/sDescribeArm Pressure (mmHG) +----------+--------+--------+--------+-------------------+ [id number]                                         +----------+--------+--------+--------+-------------------+ +---------+--------+--+--------+-+ VertebralPSV cm/s30EDV cm/s9 +---------+--------+--+--------+-+   Summary: Right Carotid: The extracranial vessels were near-normal with only minimal wall                thickening or plaque. Left Carotid: The extracranial vessels were near-normal with only minimal wall               thickening or plaque. Vertebrals:  Bilateral vertebral arteries demonstrate antegrade flow. Subclavians: Normal flow hemodynamics were seen in bilateral subclavian              arteries. *See table(s) above for measurements and observations.  Electronically signed by Delia Heady MD on 08/10/2020 at 11:52:21 AM.    Final    Recent Labs    08/10/20 0428  WBC 9.1  HGB 11.0*  HCT 34.5*  PLT 354   Recent Labs  08/10/20 0428  NA 138  K 4.3  CL 104  CO2 26  GLUCOSE 125*  BUN 24*  CREATININE 0.59*  CALCIUM 8.3*   No intake or output data in the 24 hours ending 08/12/20 1040       Physical Exam: Vital Signs Blood pressure 119/70, pulse 88, temperature 97.7 F (36.5 C), temperature source Oral, resp. rate (!) 24, height 5\' 4"  (1.626 m), weight 43.6 kg, SpO2 96 %.     General: asleep; but woke briefly- shook head no and turned over; NAD HENT: conjugate gaze; oropharynx moist CV: regular rate; no JVD Pulmonary: somewhat coarse and decrease breath sounds at bases, but much more clear- no coughing;  GI: soft, NT, ND, (+)BS Psychiatric:  sleepy Neurological: sleepy Musculoskeletal:        General: No swelling. Normal range of motion.    Cervical back: Normal range of motion. Skin:    General: Skin is warm. Neurological:    Mental Status: He is alert.    Comments: Patient is alert.  With assistance of video interpreter he does follow simple commands.  Provides year in person but decreased awareness and insight as a whole  Patient is dysphonic. Moves all 4 limbs. Senses pain.      Assessment/Plan: 1. Functional deficits which require 3+ hours per day of interdisciplinary therapy in a comprehensive inpatient rehab setting. Physiatrist is providing close team supervision and 24 hour management of active medical problems listed below. Physiatrist and rehab team continue to assess barriers to discharge/monitor patient progress toward functional and medical goals  Care Tool:  Bathing              Bathing assist       Upper Body Dressing/Undressing Upper body dressing   What is the patient wearing?: Pull over shirt    Upper body assist Assist Level: Minimal Assistance - Patient > 75%    Lower Body Dressing/Undressing Lower body dressing      What is the patient wearing?: Incontinence brief, Pants     Lower body assist Assist for lower body dressing: Minimal Assistance - Patient > 75%     Toileting Toileting    Toileting assist       Transfers Chair/bed transfer  Transfers assist     Chair/bed transfer assist level: Minimal Assistance - Patient > 75%     Locomotion Ambulation   Ambulation assist      Assist level: Minimal Assistance - Patient > 75% Assistive device: Hand held assist Max distance: 3278ft   Walk 10 feet activity   Assist     Assist level: Minimal Assistance - Patient > 75% Assistive device: Hand held assist   Walk 50 feet activity   Assist    Assist level: Minimal Assistance - Patient > 75% Assistive device: Hand held assist    Walk 150 feet  activity   Assist Walk 150 feet activity did not occur: Safety/medical concerns         Walk 10 feet on uneven surface  activity   Assist     Assist level: Moderate Assistance - Patient - 50 - 74% Assistive device: Other (comment), Hand held assist (railing)   Wheelchair     Assist Will patient use wheelchair at discharge?: No             Wheelchair 50 feet with 2 turns activity    Assist            Wheelchair 150 feet activity  Assist          Blood pressure 119/70, pulse 88, temperature 97.7 F (36.5 C), temperature source Oral, resp. rate (!) 24, height 5\' 4"  (1.626 m), weight 43.6 kg, SpO2 96 %.  Medical Problem List and Plan: 1.  Debility with altered mental status secondary to CAD/anterior STEMI /cardiogenic shock with occluded proximal LAD status post stenting post subacute left frontal infarct             -patient may shower             -ELOS/Goals: 16-20 days, Mod I with PT/OT, supervision SLP  Con't CIR_ PT, OT and SLP 2.  Antithrombotics: -DVT/anticoagulation: Lovenox             -antiplatelet therapy: Aspirin 81 mg daily, Brilinta 90 mg twice daily 3. Pain Management: Oxycodone as needed 4. Mood: Klonopin 0.5 mg 3 times daily             -antipsychotic agents: N/A 5. Neuropsych: This patient is not capable of making decisions on his own behalf.             -is fall risk, safety plan needed 6. Skin/Wound Care: Routine skin checks 7. Fluids/Electrolytes/Nutrition: Routine in and outs with follow-up chemistries on admit. 8.  Acute hypoxemic respiratory failure.  Patient extubated.  Suspect HCAP completing 7-day course of antibiotic therapy 9.  Right pleural effusion.  Status postthoracentesis 08/01/2020 with 450 cc fluid removal.  Follow-up chest x-ray 08/02/2020 without effusion 10.  Dysphagia.  NPO. NGT feedings currently -f/u MBS, plan per SLP  6/12- Stil NPO- per SLP- on Tfs continuous.  11.  Hypertension.  Coreg 3.125 mg twice  daily, Lanoxin 0.125 mg daily 12.  Hyperlipidemia.  Crestor 13.  New findings diabetes mellitus.  Hemoglobin A1c 6.5.  SSI.  NovoLog 3 units every 4 hours.  Diabetic teaching 14.  Tobacco abuse.  NicoDerm patch.  Counseling  15. Vascular congestion/coughing  6/11- will try Albuterol nebs q4 hours prn; flutter valve if possible and give 1 dose of Lasix 40 mg- CXR showed vular congestion- nothing else. 6/12- improved once had mucus plug come up- con't to monitor-        LOS: 2 days A FACE TO FACE EVALUATION WAS PERFORMED  Zuha Dejonge 08/12/2020, 10:40 AM

## 2020-08-12 NOTE — Discharge Instructions (Addendum)
Inpatient Rehab Discharge Instructions  Vincent Horton Discharge date and time: No discharge date for patient encounter.   Activities/Precautions/ Functional Status: Activity: As tolerated Diet: Dysphagia #1 nectar thick liquids Wound Care: Routine skin checks Functional status:  ___ No restrictions     ___ Walk up steps independently ___ 24/7 supervision/assistance   ___ Walk up steps with assistance ___ Intermittent supervision/assistance  ___ Bathe/dress independently ___ Walk with walker     __x_ Bathe/dress with assistance ___ Walk Independently    ___ Shower independently ___ Walk with assistance    ___ Shower with assistance ___ No alcohol     ___ Return to work/school ________  COMMUNITY REFERRALS UPON DISCHARGE:    Outpatient: PT     OT    ST                Agency:[facility] Phone:[phone]              Appointment Date/Time:TBD  Medical Equipment/Items Ordered:Rolling Walker                                                 Agency/Supplier:[employer]  Special Instructions:  No driving smoking or alcohol  My questions have been answered and I understand these instructions. I will adhere to these goals and the provided educational materials after my discharge from the hospital.  Patient/Caregiver Signature _______________________________ Date __________  Clinician Signature _______________________________________ Date __________  Please bring this form and your medication list with you to all your follow-up doctor's appointments.  STROKE/TIA DISCHARGE INSTRUCTIONS SMOKING Cigarette smoking nearly doubles your risk of having a stroke & is the single most alterable risk factor  If you smoke or have smoked in the last 12 months, you are advised to quit smoking for your health. Most of the excess cardiovascular risk related to smoking disappears within a year of stopping. Ask you doctor about anti-smoking medications [place] Quit Line: 1-800-QUIT  NOW Free Smoking Cessation Classes [phone]  CHOLESTEROL Know your levels; limit fat & cholesterol in your diet  Lipid Panel     Component Value Date/Time   CHOL 172 07/21/2020 0109   TRIG 144 07/21/2020 0109   HDL 35 (L) 07/21/2020 0109   CHOLHDL 4.9 07/21/2020 0109   VLDL 29 07/21/2020 0109   LDLCALC 108 (H) 07/21/2020 0109     Many patients benefit from treatment even if their cholesterol is at goal. Goal: Total Cholesterol (CHOL) less than 160 Goal:  Triglycerides (TRIG) less than 150 Goal:  HDL greater than 40 Goal:  LDL (LDLCALC) less than 100   BLOOD PRESSURE American Stroke Association blood pressure target is less that 120/80 mm/Hg  Your discharge blood pressure is:  BP: (!) 90/57 Monitor your blood pressure Limit your salt and alcohol intake Many individuals will require more than one medication for high blood pressure  DIABETES (A1c is a blood sugar average for last 3 months) Goal HGBA1c is under 7% (HBGA1c is blood sugar average for last 3 months)  Diabetes: No known diagnosis of diabetes    Lab Results  Component Value Date   HGBA1C 6.5 (H) 07/21/2020    Your HGBA1c can be lowered with medications, healthy diet, and exercise. Check your blood sugar as directed by your physician Call your physician if you experience unexplained or low blood sugars.  PHYSICAL  ACTIVITY/REHABILITATION Goal is 30 minutes at least 4 days per week  Activity: Increase activity slowly, Therapies: Physical Therapy: Home Health Return to work:  Activity decreases your risk of heart attack and stroke and makes your heart stronger.  It helps control your weight and blood pressure; helps you relax and can improve your mood. Participate in a regular exercise program. Talk with your doctor about the best form of exercise for you (dancing, walking, swimming, cycling).  DIET/WEIGHT Goal is to maintain a healthy weight  Your discharge diet is:  Diet Order             Diet NPO time  specified  Diet effective now                   liquids Your height is:  Height: 5\' 4"  (162.6 cm) Your current weight is: Weight: 43.3 kg Your Body Mass Index (BMI) is:  BMI (Calculated): 16.38 Following the type of diet specifically designed for you will help prevent another stroke. Your goal weight range is:   Your goal Body Mass Index (BMI) is 19-24. Healthy food habits can help reduce 3 risk factors for stroke:  High cholesterol, hypertension, and excess weight.  RESOURCES Stroke/Support Group:  Call [phone]   STROKE EDUCATION PROVIDED/REVIEWED AND GIVEN TO PATIENT Stroke warning signs and symptoms How to activate emergency medical system (call 911). Medications prescribed at discharge. Need for follow-up after discharge. Personal risk factors for stroke. Pneumonia vaccine given: No Flu vaccine given: No My questions have been answered, the writing is legible, and I understand these instructions.  I will adhere to these goals & educational materials that have been provided to me after my discharge from the hospital.

## 2020-08-12 NOTE — Progress Notes (Signed)
Patient very congestion with a weak cough through the night. Earlier this morning 0500am patient started breathing heavily and sweating, nurse suction patient and got a large black thick mucous plug out. Patient resting comfortable post event.

## 2020-08-13 DIAGNOSIS — I63412 Cerebral infarction due to embolism of left middle cerebral artery: Secondary | ICD-10-CM

## 2020-08-13 LAB — CBC WITH DIFFERENTIAL/PLATELET
Abs Immature Granulocytes: 0.03 10*3/uL (ref 0.00–0.07)
Basophils Absolute: 0.1 10*3/uL (ref 0.0–0.1)
Basophils Relative: 1 %
Eosinophils Absolute: 0.4 10*3/uL (ref 0.0–0.5)
Eosinophils Relative: 5 %
HCT: 33.5 % — ABNORMAL LOW (ref 39.0–52.0)
Hemoglobin: 10.6 g/dL — ABNORMAL LOW (ref 13.0–17.0)
Immature Granulocytes: 0 %
Lymphocytes Relative: 24 %
Lymphs Abs: 2.1 10*3/uL (ref 0.7–4.0)
MCH: 30.1 pg (ref 26.0–34.0)
MCHC: 31.6 g/dL (ref 30.0–36.0)
MCV: 95.2 fL (ref 80.0–100.0)
Monocytes Absolute: 0.6 10*3/uL (ref 0.1–1.0)
Monocytes Relative: 7 %
Neutro Abs: 5.4 10*3/uL (ref 1.7–7.7)
Neutrophils Relative %: 63 %
Platelets: 327 10*3/uL (ref 150–400)
RBC: 3.52 MIL/uL — ABNORMAL LOW (ref 4.22–5.81)
RDW: 16 % — ABNORMAL HIGH (ref 11.5–15.5)
WBC: 8.5 10*3/uL (ref 4.0–10.5)
nRBC: 0 % (ref 0.0–0.2)

## 2020-08-13 LAB — COMPREHENSIVE METABOLIC PANEL
ALT: 72 U/L — ABNORMAL HIGH (ref 0–44)
AST: 29 U/L (ref 15–41)
Albumin: 2.3 g/dL — ABNORMAL LOW (ref 3.5–5.0)
Alkaline Phosphatase: 105 U/L (ref 38–126)
Anion gap: 6 (ref 5–15)
BUN: 26 mg/dL — ABNORMAL HIGH (ref 8–23)
CO2: 30 mmol/L (ref 22–32)
Calcium: 8.4 mg/dL — ABNORMAL LOW (ref 8.9–10.3)
Chloride: 97 mmol/L — ABNORMAL LOW (ref 98–111)
Creatinine, Ser: 0.75 mg/dL (ref 0.61–1.24)
GFR, Estimated: >60 mL/min (ref >60)
Glucose, Bld: 145 mg/dL — ABNORMAL HIGH (ref 70–99)
Potassium: 4.5 mmol/L (ref 3.5–5.1)
Sodium: 133 mmol/L — ABNORMAL LOW (ref 135–145)
Total Bilirubin: 0.4 mg/dL (ref 0.3–1.2)
Total Protein: 5.5 g/dL — ABNORMAL LOW (ref 6.5–8.1)

## 2020-08-13 LAB — GLUCOSE, CAPILLARY
Glucose-Capillary: 108 mg/dL — ABNORMAL HIGH (ref 70–99)
Glucose-Capillary: 139 mg/dL — ABNORMAL HIGH (ref 70–99)
Glucose-Capillary: 158 mg/dL — ABNORMAL HIGH (ref 70–99)
Glucose-Capillary: 164 mg/dL — ABNORMAL HIGH (ref 70–99)
Glucose-Capillary: 182 mg/dL — ABNORMAL HIGH (ref 70–99)
Glucose-Capillary: 79 mg/dL (ref 70–99)

## 2020-08-13 NOTE — Progress Notes (Signed)
Physical Therapy Session Note  Patient Details  Name: Vincent Horton MRN: [id number] Date of Birth: [birth year]  Today's Date: 08/13/2020 PT Individual Time: 2409-7353 PT Individual Time Calculation (min): 83 min   Short Term Goals: Week 1:  PT Short Term Goal 1 (Week 1): Pt will perform sit<>stands using LRAD with CGA PT Short Term Goal 2 (Week 1): Pt will perform bed<>chair transfers using LRAD with CGA PT Short Term Goal 3 (Week 1): Pt will ambulate at least 160ft using LRAD with CGA PT Short Term Goal 4 (Week 1): Pt will ascend/descend 4 steps using HRs per home set-up with CGA  Skilled Therapeutic Interventions/Progress Updates:    Patient received reclined in bed, received rx from RN, agreeable to PT. He denies pain. Interpretor initially used, however, Chartered certified accountant services became disconnected partway through session and no interpretor was available afterward. Cortrak becoming clogged with rx. Positioning in bed with verbal cues and CGA to assist with unclogging. He was able to come sit edge of bed with supervision and verbal cues. Patient perseverating on going home and walking home throughout session. He transferred to wc via stand pivot with CGA. PT transporting patient in wc to therapy gym for time management and energy conservation. Patient ambulating 68ft with RW initially. He was not responsive to verbal cues to maintain safe distance from RW-often keeping it too far in front of himself. NBOS and intermittent scissoring noted. Patient ambulating additional 78ft with no RW and MinA. Patietn maintains NBOS, but less scissoring. PT added 1.5# ankle weights B to improve proprioceptive input with good effect further minimizing scissoring. PT attempting to have patient complete DGI to further assess gait and since patient has goals of being ModI/supervision at dc. However, patient unable to follow simple cues or demonstration to complete outcome measure. PT then attempting to have patient  complete TUG- however, patient also not understanding of instructions/demonstration and continued to walk past the cone and was not responsive to multimodal cues to complete outcome measure accurately. Patient completing 5 mins on NuStep with max verbal cues and demonstration. Patient maintaining very short/incomplete strides, consistent with his gait pattern. Patient then stopped activity and was attempting to stand up from NuStep before it was turned/safely positioned. Patient attempting to tell PT something, but was so hypophonic, PT unable to understand. PT able to discern that patient wanted to get back into the wheelchair- CGA via stand pivot to do so. Patient attempting to propel himself in wc, but was unable to. PT able to discern that patient wished to return to his room. Once in his room, patient attempting to stand up from wc before brakes were locked and leg rests removed. PT attempting to educate patient on safe positioning of wc prior to transfers, however patient did not appear to be receptive to education. CGA stand pivot to bed, supervision to return supine. Bed alarm on, call light within reach, HOB >30*.   Therapy Documentation Precautions:  Precautions Precautions: Fall, Other (comment) Precaution Comments: cortrak, slightly impulsive Restrictions Weight Bearing Restrictions: No    Therapy/Group: Individual Therapy  Elizebeth Koller, PT, DPT, CBIS  08/13/2020, 7:42 AM

## 2020-08-13 NOTE — Progress Notes (Signed)
Speech Language Pathology Daily Session Note  Patient Details  Name: Vincent Horton MRN: [id number] Date of Birth: 10-27-1955  Today's Date: 08/13/2020 SLP Individual Time: 1120-1200 SLP Individual Time Calculation (min): 40 min  Short Term Goals: Week 1: SLP Short Term Goal 1 (Week 1): Pt will name common objects/pictures with 90% accuracy provided min A SLP Short Term Goal 2 (Week 1): Pt will consistently follow 2-3 step commands with min A SLP Short Term Goal 3 (Week 1): Pt will sustain attention to functional tasks for 5-7 minutes with mod A SLP Short Term Goal 4 (Week 1): Pt will utilize compensatory strategies as trained to increase intelligibility at word/phrase level with max A SLP Short Term Goal 5 (Week 1): Pt will tolerate ice chip trials utilizing compensatory strategies with minimal s/s aspiration provided mod A cues SLP Short Term Goal 6 (Week 1): Pt will participate in further testing of cognitive communication function  Skilled Therapeutic Interventions:Skilled ST services focused on swallow and language skills. Pt required max A multimodal cues to follow commands during bed mobility. Pt completed oral care with suction toothbrush with Mod A for thoroughness. Pt consumed 8 small ice chips trials, pt was able to demonstrate slow but appropriate mastication and AP transit appeared mildly delayed, however demonstrated delayed throat clear and/or cough on 6 out 8 trials. Pt's vocal quality did appear dry after throat clearing/cough. SLP recommends to continue NPO status. SLP facilitated naming of common objects pt was able to name 6 out 8 items with very low vocal intensity using a mix of english and somalin language. Pt was able to identify 6 out 8 objects in a field of 2. Pt was left in room with call bell within reach and bed alarm set. SLP recommends to continue skilled services.     Pain Pain Assessment Pain Scale: Faces Pain Score: 0-No pain  Therapy/Group: Individual  Therapy  [name]  [facility] 08/13/2020, 12:16 PM

## 2020-08-13 NOTE — Progress Notes (Signed)
PROGRESS NOTE   Subjective/Complaints:  "I want to go home, I live on [address]"    ROS: Limited due to language/cognition   Objective:   DG CHEST PORT 1 VIEW  Result Date: 08/11/2020 CLINICAL DATA:  Coughing.  CVA. EXAM: PORTABLE CHEST 1 VIEW COMPARISON:  08/02/2020 FINDINGS: Lung apices are cut off the film. Enteric tube courses through the stomach and off the film as tip is not visualized. Lungs are adequately inflated with subtle hazy prominence of the infrahilar markings. No lobar consolidation or effusion. Coronary stent unchanged. Cardiomediastinal silhouette and remainder of the exam is unchanged. IMPRESSION: Suggestion of minimal vascular congestion. Electronically Signed   By: Elberta Fortis M.D.   On: 08/11/2020 10:38   Recent Labs    08/13/20 0638  WBC 8.5  HGB 10.6*  HCT 33.5*  PLT 327    Recent Labs    08/13/20 0638  NA 133*  K 4.5  CL 97*  CO2 30  GLUCOSE 145*  BUN 26*  CREATININE 0.75  CALCIUM 8.4*     Intake/Output Summary (Last 24 hours) at 08/13/2020 0909 Last data filed at 08/13/2020 0440 Gross per 24 hour  Intake 360 ml  Output 623 ml  Net -263 ml         Physical Exam: Vital Signs Blood pressure (!) 90/57, pulse 86, temperature 98.2 F (36.8 C), resp. rate 18, height 5\' 4"  (1.626 m), weight 43.3 kg, SpO2 99 %.     General: No acute distress Mood and affect are appropriate Heart: Regular rate and rhythm no rubs murmurs or extra sounds Lungs: Clear to auscultation, breathing unlabored, no rales or wheezes Abdomen: Positive bowel sounds, soft nontender to palpation, nondistended Extremities: No clubbing, cyanosis, or edema  Musculoskeletal:        General: No swelling. Normal range of motion.    Cervical back: Normal range of motion. Skin:    General: Skin is warm. Neurological:    Mental Status: He is alert.    Comments: Patient is alert.  With assistance of video  interpreter he does follow simple commands.  Provides year in person but decreased awareness and insight as a whole  Patient is dysphonic. Moves all 4 limbs. Senses pain.      Assessment/Plan: 1. Functional deficits which require 3+ hours per day of interdisciplinary therapy in a comprehensive inpatient rehab setting. Physiatrist is providing close team supervision and 24 hour management of active medical problems listed below. Physiatrist and rehab team continue to assess barriers to discharge/monitor patient progress toward functional and medical goals  Care Tool:  Bathing              Bathing assist       Upper Body Dressing/Undressing Upper body dressing   What is the patient wearing?: Pull over shirt    Upper body assist Assist Level: Minimal Assistance - Patient > 75%    Lower Body Dressing/Undressing Lower body dressing      What is the patient wearing?: Incontinence brief, Pants     Lower body assist Assist for lower body dressing: Minimal Assistance - Patient > 75%     Toileting Toileting  Toileting assist Assist for toileting: Moderate Assistance - Patient 50 - 74%     Transfers Chair/bed transfer  Transfers assist     Chair/bed transfer assist level: Minimal Assistance - Patient > 75%     Locomotion Ambulation   Ambulation assist      Assist level: Minimal Assistance - Patient > 75% Assistive device: Hand held assist Max distance: 3ft   Walk 10 feet activity   Assist     Assist level: Minimal Assistance - Patient > 75% Assistive device: Hand held assist   Walk 50 feet activity   Assist    Assist level: Minimal Assistance - Patient > 75% Assistive device: Hand held assist    Walk 150 feet activity   Assist Walk 150 feet activity did not occur: Safety/medical concerns         Walk 10 feet on uneven surface  activity   Assist     Assist level: Moderate Assistance - Patient - 50 - 74% Assistive device: Other  (comment), Hand held assist (railing)   Wheelchair     Assist Will patient use wheelchair at discharge?: No             Wheelchair 50 feet with 2 turns activity    Assist            Wheelchair 150 feet activity     Assist          Blood pressure (!) 90/57, pulse 86, temperature 98.2 F (36.8 C), resp. rate 18, height 5\' 4"  (1.626 m), weight 43.3 kg, SpO2 99 %.  Medical Problem List and Plan: 1.  Debility with altered mental status secondary to CAD/anterior STEMI /cardiogenic shock with occluded proximal LAD status post stenting post subacute left frontal infarct             -patient may shower             -ELOS/Goals: 16-20 days, Mod I with PT/OT, supervision SLP  Con't CIR_ PT, OT and SLP 2.  Antithrombotics: -DVT/anticoagulation: Lovenox             -antiplatelet therapy: Aspirin 81 mg daily, Brilinta 90 mg twice daily 3. Pain Management: Oxycodone as needed 4. Mood: Klonopin 0.5 mg 3 times daily             -antipsychotic agents: N/A 5. Neuropsych: This patient is not capable of making decisions on his own behalf.             -is fall risk, safety plan needed 6. Skin/Wound Care: Routine skin checks 7. Fluids/Electrolytes/Nutrition: Routine in and outs with follow-up chemistries on admit. 8.  Acute hypoxemic respiratory failure.  Patient extubated.  Suspect HCAP completing 7-day course of antibiotic therapy 9.  Right pleural effusion.  Status postthoracentesis 08/01/2020 with 450 cc fluid removal.  Follow-up chest x-ray 08/02/2020 without effusion 10.  Dysphagia.  NPO. NGT feedings currently -f/u MBS, plan per SLP  6/12- Stil NPO- per SLP- on Tfs continuous.  11.  Hypertension.  Coreg 3.125 mg twice daily, Lanoxin 0.125 mg daily Vitals:   08/12/20 1948 08/13/20 0413  BP: 96/71 (!) 90/57  Pulse: 89 86  Resp: 20 18  Temp: 98 F (36.7 C) 98.2 F (36.8 C)  SpO2: 100% 99%   BPs soft will check BMET may need to add free H2O  12.  Hyperlipidemia.   Crestor 13.  New findings diabetes mellitus.  Hemoglobin A1c 6.5.  SSI.  NovoLog 3 units every 4 hours.  Diabetic teaching CBG (last 3)  Recent Labs    08/12/20 2357 08/13/20 0427 08/13/20 0800  GLUCAP 138* 139* 164*    14.  Tobacco abuse.  NicoDerm patch.  Counseling  15. Vascular congestion/coughing  Improved with albuterol       LOS: 3 days A FACE TO FACE EVALUATION WAS PERFORMED  Erick Colace 08/13/2020, 9:09 AM

## 2020-08-13 NOTE — IPOC Note (Signed)
Overall Plan of Care [facility]) Patient Details Name: Vincent Horton MRN: [id number] DOB: 1956/01/23  Admitting Diagnosis: CVA (cerebral vascular accident) [facility])  Hospital Problems: Principal Problem:   CVA (cerebral vascular accident) [facility])     Functional Problem List: Nursing Bladder, Bowel, Endurance, Medication Management, Safety, Nutrition, Pain  PT Balance, Safety, Behavior, Sensory, Edema, Skin Integrity, Endurance, Motor, Nutrition, Pain, Perception  OT Balance, Safety, Behavior, Sensory, Cognition, Endurance, Motor  SLP Cognition, Nutrition, Safety  TR         Basic ADL's: OT Grooming, Bathing, Dressing, Toileting     Advanced  ADL's: OT       Transfers: PT Bed Mobility, Bed to Chair, Car, Furniture, Floor  OT Toilet, Tub/Shower     Locomotion: PT Ambulation, Stairs     Additional Impairments: OT None  SLP Swallowing, Communication comprehension, expression    TR      Anticipated Outcomes Item Anticipated Outcome  Self Feeding N/A  Swallowing  Min A   Basic self-care  Supervision due to cognitive impairments  Toileting  Supervision due to cognitive impairments   Bathroom Transfers Supervision due to cognitive impairments  Bowel/Bladder  manage bowel and bladder with min assist  Transfers  supervision using LRAD  Locomotion  supervision using LRAD  Communication  Min A  Cognition  Min A basic cog  Pain  at or below leve 4  Safety/Judgment  maintain safety with cues/reminders   Therapy Plan: PT Intensity: Minimum of 1-2 x/day ,45 to 90 minutes PT Frequency: 5 out of 7 days PT Duration Estimated Length of Stay: ~1.5-2 weeks OT Intensity: Minimum of 1-2 x/day, 45 to 90 minutes OT Frequency: 5 out of 7 days OT Duration/Estimated Length of Stay: 14-17 days SLP Intensity: Minumum of 1-2 x/day, 30 to 90 minutes SLP Frequency: 3 to 5 out of 7 days SLP Duration/Estimated Length of Stay: 3-4 weeks   Due to the current state of emergency, patients may  not be receiving their 3-hours of Medicare-mandated therapy.   Team Interventions: Nursing Interventions Patient/Family Education, Bowel Management, Disease Management/Prevention, Bladder Management, Medication Management, Discharge Planning, Pain Management, Dysphagia/Aspiration Precaution Training  PT interventions Ambulation/gait training, Community reintegration, DME/adaptive equipment instruction, Neuromuscular re-education, Psychosocial support, Stair training, Discharge planning, Functional electrical stimulation, Balance/vestibular training, UE/LE Strength taining/ROM, Pain management, Skin care/wound management, Therapeutic Activities, UE/LE Coordination activities, Cognitive remediation/compensation, Disease management/prevention, Functional mobility training, Patient/family education, Therapeutic Exercise, Visual/perceptual remediation/compensation, Splinting/orthotics  OT Interventions Balance/vestibular training, Disease mangement/prevention, Neuromuscular re-education, Self Care/advanced ADL retraining, Cognitive remediation/compensation, DME/adaptive equipment instruction, Pain management, Skin care/wound managment, Community reintegration, Functional electrical stimulation, Patient/family education, Splinting/orthotics, Discharge planning, Functional mobility training, Psychosocial support, Therapeutic Activities, Therapeutic Exercise, UE/LE Strength taining/ROM, Visual/perceptual remediation/compensation, UE/LE Coordination activities  SLP Interventions Cognitive remediation/compensation, Multimodal communication approach, Speech/Language facilitation, Therapeutic Activities, Therapeutic Exercise, Functional tasks, Cueing hierarchy, Internal/external aids, Dysphagia/aspiration precaution training, Medication managment, Patient/family education  TR Interventions    SW/CM Interventions Discharge Planning, Psychosocial Support, Patient/Family Education   Barriers to Discharge MD  Medical  stability  Nursing Decreased caregiver support, Incontinence, New diabetic, Nutrition means, Insurance for SNF coverage home with dtrs 1 level 3 step entry  PT Home environment access/layout, Lack of/limited family support    OT      SLP Nutrition means    SW       Team Discharge Planning: Destination: PT-Home ,OT- Home , SLP-Home Projected Follow-up: PT-Outpatient PT, 24 hour supervision/assistance, OT-  None, SLP-Home Health SLP, Outpatient SLP Projected Equipment Needs: PT-To be determined,  OT- To be determined, SLP-To be determined Equipment Details: PT- , OT-  Patient/family involved in discharge planning: PT- Patient,  OT-Patient, SLP-Patient  MD ELOS: 16-20d Medical Rehab Prognosis:  Good Assessment:  65 year old right-handed non-English-speaking [name] male with unknown medical history on no prescription medications.  Per chart review patient lives with her 3 daughters.  Independent prior to admission.  1 level home 3 steps to entry.  Presented 07/21/2020 after being found unresponsive by family.  EMS arrival patient pulseless and apneic.  Initially in V. fib total of 5 shocks, 3 epis, 450 mg amiodarone 15 minutes of CPR.  Patient agitated and hypotensive upon arrival to the ED received Versed.  Admission chemistry glucose 275, creatinine 1.40, AST 328, ALT 251, hemoglobin A1c 6.5, troponin 2747, WBC 13,200, lactic acid 3.7.  EKG showing ST segment changes in anterior lateral leads and underwent emergent cardiac catheterization requiring LAD stenting.  Echocardiogram ejection fraction of 30 to 35% left ventricle moderately decreased function.  Cranial CT scan negative for cerebral edema.  No evidence of anoxic brain injury.  Hypodensity in the deep white matter in the frontal lobes bilaterally likely due to chronic microvascular ischemia.  She did require vasopressors for hypotension.  Hospital course respiratory failure intubation treated for right side aspiration pneumonia as well as  patient developed right pleural effusion undergoing thoracentesis 08/01/2020 with 450 cc fluid removal.  Follow-up chest x-ray 08/02/2020 without effusion...  EEG negative for seizure but did suggest severe diffuse encephalopathy.  Patient remains on low-dose aspirin therapy per cardiology services as well as Brilinta.  Subcutaneous Lovenox for DVT prophylaxis.  Currently n.p.o. with alternative means of nutritional support.  MRI of the brain completed 08/07/2020 due to persistent altered mental status changes post cardiac arrest showing punctate acute to subacute left frontal infarct.  No evidence of hypoxic ischemic injury.  Patient remained on low-dose aspirin/Brilinta therapy for both CVA prophylaxis as well as cardiac.  Palliative care consulted to establish goals of care.  Therapy evaluations completed due to patient's altered mental status cardiac arrest was admitted for a comprehensive rehab program   See Team Conference Notes for weekly updates to the plan of care

## 2020-08-13 NOTE — Progress Notes (Signed)
Patient coughing in intervals throughout the night. Thick white sputum noted with yankuer. Pt transferred to bedside commode and toilet x4 this shift. Unable to void, pt cathed with resulted. Turned and repositioned throughout the night. Mouth care provided x2.

## 2020-08-13 NOTE — Progress Notes (Signed)
Occupational Therapy Session Note  Patient Details  Name: Vincent Horton MRN: [id number] Date of Birth: 09/16/1955  Today's Date: 08/13/2020 OT Individual Time: 5852-7782 OT Individual Time Calculation (min): 54 min    Short Term Goals: Week 1:  OT Short Term Goal 1 (Week 1): Pt will following 2-step directions during functional ADL tasks with min prompting. OT Short Term Goal 2 (Week 1): Pt will complete toileting task with CGA. OT Short Term Goal 3 (Week 1): Pt will complete LB dressing with CGA. OT Short Term Goal 4 (Week 1): Pt will complete functional transfers during ADLs with CGA.  Skilled Therapeutic Interventions/Progress Updates:    Pt in bed to start session with video interpreter utilized.  He was aware of the type of building he was in as well as the city.  He was not able to state the reason for being in a hospital.  Interpreter reported difficulty hearing pt most of the time, secondary to low voice volume.  He was agreeable to completion of bathing and dressing tasks at the sink.  While therapist was setting up supplies, he pulled his IV out of his arm.  No bleeding noted and nursing was made aware.  Therapist disconnected and flushed coretrak Min guard assist for supine to sit with min assist for transfer over to the wheelchair without use of an assistive device.  He was able to complete UB bathing at setup level with min guard for LB bathing sit to stand.  He was able to complete UB dressing at setup as well with min guard for LB dressing.  Oxygen sats at 95% on room air throughout session.  BP was taken in sitting at 95/70 and in standing initially at 73/61.  It was taken again at 93/75.  Pt reports no symptoms of dizziness when asked.  He performed transfer back to the bed at min assist to complete session.  Call button and phone in reach with safety alarm in place.  He was re-connected back to the feeding tube as well with HOB above 30 degrees.    Therapy Documentation Precautions:   Precautions Precautions: Fall, Other (comment) Precaution Comments: cortrak, slightly impulsive Restrictions Weight Bearing Restrictions: No  Pain: Pain Assessment Pain Scale: Faces Pain Score: 0-No pain ADL: See Care Tool Section for some details of mobility and selfcare   Therapy/Group: Individual Therapy  Joyce Leckey OTR/L 08/13/2020, 12:16 PM

## 2020-08-13 NOTE — Progress Notes (Signed)
Inpatient Rehabilitation Care Coordinator Assessment and Plan Patient Details  Name: Vincent Horton MRN: [id number] Date of Birth: 1956/01/27  Today's Date: 08/13/2020  Hospital Problems: Principal Problem:   CVA (cerebral vascular accident) [facility])  Past Medical History:  Past Medical History:  Diagnosis Date   Cardiac arrest [facility])    Coronary artery disease    Dysrhythmia    Myocardial infarction [facility])    Past Surgical History:  Past Surgical History:  Procedure Laterality Date   CARDIAC CATHETERIZATION     CORONARY/GRAFT ACUTE MI REVASCULARIZATION N/A 07/21/2020   Procedure: Coronary/Graft Acute MI Revascularization;  Surgeon: Swaziland, Peter M, MD;  Location: [facility] INVASIVE CV LAB;  Service: Cardiovascular;  Laterality: N/A;   LEFT HEART CATH AND CORONARY ANGIOGRAPHY N/A 07/21/2020   Procedure: LEFT HEART CATH AND CORONARY ANGIOGRAPHY;  Surgeon: Swaziland, Peter M, MD;  Location: [facility] INVASIVE CV LAB;  Service: Cardiovascular;  Laterality: N/A;   RIGHT HEART CATH N/A 07/21/2020   Procedure: RIGHT HEART CATH;  Surgeon: Swaziland, Peter M, MD;  Location: [facility] INVASIVE CV LAB;  Service: Cardiovascular;  Laterality: N/A;   Social History:  reports that he has been smoking. He has never used smokeless tobacco. He reports previous alcohol use. He reports previous drug use.  Family / Support Systems Marital Status: Separated Children: Roseanne Reno (Son), Leanna Sato (Dauhgter), Fayrene Helper (Son) Anticipated Caregiver: Leanna Sato (dtr), Roseanne Reno (Son), other 2 daughters Ability/Limitations of Caregiver: none Caregiver Availability: 24/7 Family Dynamics: Lots of family support  Social History Preferred language: Somali Religion: Non-Denominational Read: Yes Write: Yes Employment Status: Retired Marine scientist Issues: n/a Guardian/Conservator: Chief of Staff   Abuse/Neglect Abuse/Neglect Assessment Can Be Completed: Yes Physical Abuse: Denies Verbal Abuse: Denies Sexual Abuse: Denies Exploitation of  patient/patient's resources: Denies Self-Neglect: Denies  Emotional Status Recent Psychosocial Issues: n/a Psychiatric History: n/a Substance Abuse History: n/a  Patient / Family Perceptions, Expectations & Goals Pt/Family understanding of illness & functional limitations: yes Premorbid pt/family roles/activities: Driving, not working. Independent previosuly Anticipated changes in roles/activities/participation: Family able to assit with task Pt/family expectations/goals: Supervision to MOD I  [employer]: None Premorbid Home Care/DME Agencies: None Transportation available at discharge: family able to transport  Discharge Planning Living Arrangements: Children Support Systems: Children Type of Residence: Private residence (1 level home,3 steps to enter) Civil engineer, contracting: Armed forces operational officer, [employer] (specify county) Architect: Family Youth worker Screen Referred: No Living Expenses: Lives with family Money Management: Patient, Family Does the patient have any problems obtaining your medications?: No Home Management: Independent Patient/Family Preliminary Plans: Family able to assist Care Coordinator Anticipated Follow Up Needs: HH/OP Expected length of stay: 16-20 Days  Clinical Impression Sw went to meet patient, pt asleep covering face. SW will make attempt to call pt son. SW will cont to follow up.   Andria Rhein 08/13/2020, 12:41 PM

## 2020-08-13 NOTE — Progress Notes (Signed)
Inpatient Rehabilitation Center Individual Statement of Services  Patient Name:  Mat Stuard  Date:  08/13/2020  Welcome to the Inpatient Rehabilitation Center.  Our goal is to provide you with an individualized program based on your diagnosis and situation, designed to meet your specific needs.  With this comprehensive rehabilitation program, you will be expected to participate in at least 3 hours of rehabilitation therapies Monday-Friday, with modified therapy programming on the weekends.  Your rehabilitation program will include the following services:  Physical Therapy (PT), Occupational Therapy (OT), Speech Therapy (ST), 24 hour per day rehabilitation nursing, Therapeutic Recreaction (TR), Neuropsychology, Care Coordinator, Rehabilitation Medicine, Nutrition Services, Pharmacy Services, and Other  Weekly team conferences will be held on Wednesdays to discuss your progress.  Your Inpatient Rehabilitation Care Coordinator will talk with you frequently to get your input and to update you on team discussions.  Team conferences with you and your family in attendance may also be held.  Expected length of stay: 16-20 Days  Overall anticipated outcome: Supervision to MOD I  Depending on your progress and recovery, your program may change. Your Inpatient Rehabilitation Care Coordinator will coordinate services and will keep you informed of any changes. Your Inpatient Rehabilitation Care Coordinator's name and contact numbers are listed  below.  The following services may also be recommended but are not provided by the Inpatient Rehabilitation Center:   Home Health Rehabiltiation Services Outpatient Rehabilitation Services    Arrangements will be made to provide these services after discharge if needed.  Arrangements include referral to agencies that provide these services.  Your insurance has been verified to be:  Medicare A & B Your primary doctor is:  NO PCP  Pertinent information will be  shared with your doctor and your insurance company.  Inpatient Rehabilitation Care Coordinator:  Lavera Guise, [place] [phone] or [phone]  Information discussed with and copy given to patient by: Andria Rhein, 08/13/2020, 11:12 AM

## 2020-08-14 ENCOUNTER — Inpatient Hospital Stay (HOSPITAL_COMMUNITY): Payer: Medicare Other

## 2020-08-14 LAB — GLUCOSE, CAPILLARY
Glucose-Capillary: 129 mg/dL — ABNORMAL HIGH (ref 70–99)
Glucose-Capillary: 142 mg/dL — ABNORMAL HIGH (ref 70–99)
Glucose-Capillary: 130 mg/dL — ABNORMAL HIGH (ref 70–99)
Glucose-Capillary: 115 mg/dL — ABNORMAL HIGH (ref 70–99)

## 2020-08-14 MED ORDER — FREE WATER
200.0000 mL | Status: DC
  Administered 2020-08-14 – 2020-08-19 (×30): 200 mL

## 2020-08-14 MED ORDER — GUAIFENESIN 100 MG/5ML PO SOLN
5.0000 mL | ORAL | Status: DC | PRN
  Administered 2020-08-14 (×2): 100 mg via ORAL
  Filled 2020-08-14 (×3): qty 5

## 2020-08-14 NOTE — Progress Notes (Signed)
Physical Therapy Session Note  Patient Details  Name: Vincent Horton MRN: [id number] Date of Birth: 06-10-1955  Today's Date: 08/14/2020 PT Individual Time: 6237-6283 PT Individual Time Calculation (min): 53 min   and  Today's Date: 08/14/2020 PT Missed Time: 22 Minutes Missed Time Reason: Patient fatigue  Short Term Goals: Week 1:  PT Short Term Goal 1 (Week 1): Pt will perform sit<>stands using LRAD with CGA PT Short Term Goal 2 (Week 1): Pt will perform bed<>chair transfers using LRAD with CGA PT Short Term Goal 3 (Week 1): Pt will ambulate at least 114ft using LRAD with CGA PT Short Term Goal 4 (Week 1): Pt will ascend/descend 4 steps using HRs per home set-up with CGA  Skilled Therapeutic Interventions/Progress Updates:    Pt received supine in bed with NT exiting and Dan, PA present discussing with pt and pt's son regarding pt's respiratory status due to pt's frequent coughing and upper respiratory congestion. Pt initially agreeable to therapy session. [name] present throughout session. RN in/out to disconnect tube feedings. Supine>sitting R EOB, HOB elevated but not using bedrails, with supervision. Sitting EOB doffed gripper socks and donned regular socks with pt requiring increased time due to slow movements - supervision for sitting balance safety as pt reached outside BOS. Pt suddenly returns to supine on bed stating he is tired and requests to rest today and participate tomorrow - therapist educated pt on daily therapy schedule and with encouragement from his son, pt agreeable to continue session at this time. Donned shoes mod assist for time management and energy conservation. Sit<>stands, no AD, with min assist for balance during session. Throughout session pt demos poor safety awareness when turning to sit on seat requiring min facilitation to turn hips fully and ensure safe positioning in chair. Gait training ~150ft to main therapy gym with min assist for balance due to short, shuffled  steps intermittently catching toes causing anterior LOB and narrow BOS with intermittent scissoring - cuing and facilitation for improvement with minimal change. Assessed vitals: HR 94bpm and SpO2 100%. Dynamic gait training via side stepping at hallway rail with B UE support ~35ft down/back with min assist for balance - required repeated cuing and facilitation not to cross over his feet and not to turn in direction he was stepping. Requires seated rest break after every task due to fatigue. Started dynamic gait training backwards ~67ft, no AD, with pt requiring min/mod assist for balance with continued short, shuffled steps with posterior lean and then pt stating he was too tired to walk backwards. Pt requests to return to his room. Gait training ~159ft, no AD, back to room as described above with continuation of those gait impairments. Sit>supine supervision. Supine scoot towards [facility] with therapist facilitating B LEs into hookyling position. Pt left supine in bed with HOB >30degrees, needs in reach, bed alarm on, telesitter in place, pt's son present, and RN notified of pt's position to reconnect tube feedings. Missed 22 minutes of skilled physical therapy.  Therapy Documentation Precautions:  Precautions Precautions: Fall, Other (comment) Precaution Comments: cortrak, slightly impulsive Restrictions Weight Bearing Restrictions: No   Pain: No reports of pain throughout session.   Therapy/Group: Individual Therapy  Ginny Forth , PT, DPT, CSRS 08/14/2020, 12:14 PM

## 2020-08-14 NOTE — Progress Notes (Signed)
Occupational Therapy Session Note  Patient Details  Name: Vincent Horton MRN: [id number] Date of Birth: [birth year]  Today's Date: 08/14/2020 OT Individual Time: 6004-5997 OT Individual Time Calculation (min): 58 min    Short Term Goals: Week 1:  OT Short Term Goal 1 (Week 1): Pt will following 2-step directions during functional ADL tasks with min prompting. OT Short Term Goal 2 (Week 1): Pt will complete toileting task with CGA. OT Short Term Goal 3 (Week 1): Pt will complete LB dressing with CGA. OT Short Term Goal 4 (Week 1): Pt will complete functional transfers during ADLs with CGA.  Skilled Therapeutic Interventions/Progress Updates:    Pt in bed to start with video interpreter utilized initially.  This was not successful as pt would demonstrate dysarthric speech at times with reduced volume and interpreter could not understand him.  Also interpreter line disconnected as well so it was not utilized after the first few mins with work on Architectural technologist.  He was disconnected from the feeding tube and was able to transfer to the EOB with supervision.  He then worked on washing his front and back peri area sit to stand with min assist and then donned new brief and pants at the same level.  He was able to donn his gripper socks at setup level as well.  Had him next ambulate down to the ortho gym with min assist and no device.  Frequent LOB noted to the left during mobility.  Once in the gym O2 sats were checked at 93% on room air with HR at 87 BPM.  No report of dizziness when asked.  Had him complete functional mobility and balance task of picking up cones placed around the room.  He was able to pick up 7-8 cones with min assist secondary to LOB to the left when attempting to stand up and walk to soon.  After completion of this pt's son came in to the therapy gym.  Discussed with him pt's current level of assist and need for 24 hr supervision for safety.  He reports that currently they did not have  24 hr at home as pt was independent, but they are currently working on arranging it.  Had pt next ambulate to the tub room where he completed step in transfer using the wall for support with min assist for balance.  Discussed need for a shower seat vs shower bench based on current level of assist.  He will purchase from [employer].  Finished session with ambulation back to the room at min assist level and no device.  He was able to transfer supine with supervision and HOB elevated back above 30 degrees.  Call button and phone in reach with safety alarm in place.  Tub feeding also hooked back up and running as well.    Therapy Documentation Precautions:  Precautions Precautions: Fall, Other (comment) Precaution Comments: cortrak, slightly impulsive Restrictions Weight Bearing Restrictions: No  Pain: Pain Assessment Pain Scale: 0-10 Pain Score: 0-No pain ADL: See Care Tool Section for some details of mobility and selfcare   Therapy/Group: Individual Therapy  Stephaniemarie Stoffel OTR/L 08/14/2020, 12:51 PM

## 2020-08-14 NOTE — Progress Notes (Signed)
PROGRESS NOTE   Subjective/Complaints: Pt wants to go home, discussed swallowing problems after CVA  Labs reviewed     ROS: Limited due to language/cognition   Objective:   No results found. Recent Labs    08/13/20 0638  WBC 8.5  HGB 10.6*  HCT 33.5*  PLT 327    Recent Labs    08/13/20 0638  NA 133*  K 4.5  CL 97*  CO2 30  GLUCOSE 145*  BUN 26*  CREATININE 0.75  CALCIUM 8.4*     Intake/Output Summary (Last 24 hours) at 08/14/2020 0749 Last data filed at 08/14/2020 0100 Gross per 24 hour  Intake --  Output 1100 ml  Net -1100 ml          Physical Exam: Vital Signs Blood pressure (!) 84/60, pulse 86, temperature 98.7 F (37.1 C), resp. rate 16, height 5\' 4"  (1.626 m), weight 43.3 kg, SpO2 98 %.   General: No acute distress Mood and affect are appropriate Heart: Regular rate and rhythm no rubs murmurs or extra sounds Lungs: Clear to auscultation, breathing unlabored, no rales or wheezes Abdomen: Positive bowel sounds, soft nontender to palpation, nondistended Extremities: No clubbing, cyanosis, or edema Skin: No evidence of breakdown, no evidence of rash   Musculoskeletal:        General: No swelling. Normal range of motion.    Cervical back: Normal range of motion. Skin:    General: Skin is warm. Neurological:    Mental Status: He is alert.    Comments: Patient is alert.  With assistance of video interpreter he does follow simple commands.  Provides year in person but decreased awareness and insight as a whole  Patient is dysphonic. Moves all 4 limbs. Senses pain.      Assessment/Plan: 1. Functional deficits which require 3+ hours per day of interdisciplinary therapy in a comprehensive inpatient rehab setting. Physiatrist is providing close team supervision and 24 hour management of active medical problems listed below. Physiatrist and rehab team continue to assess barriers to  discharge/monitor patient progress toward functional and medical goals  Care Tool:  Bathing    Body parts bathed by patient: Right arm, Left arm, Chest, Abdomen, Front perineal area, Buttocks, Right upper leg, Left upper leg, Face, Left lower leg, Right lower leg         Bathing assist Assist Level: Minimal Assistance - Patient > 75%     Upper Body Dressing/Undressing Upper body dressing   What is the patient wearing?: Pull over shirt    Upper body assist Assist Level: Set up assist    Lower Body Dressing/Undressing Lower body dressing      What is the patient wearing?: Incontinence brief, Pants     Lower body assist Assist for lower body dressing: Contact Guard/Touching assist     Toileting Toileting    Toileting assist Assist for toileting: Moderate Assistance - Patient 50 - 74%     Transfers Chair/bed transfer  Transfers assist     Chair/bed transfer assist level: Contact Guard/Touching assist     Locomotion Ambulation   Ambulation assist      Assist level: Contact Guard/Touching assist Assistive device: Hand held  assist Max distance: 83   Walk 10 feet activity   Assist     Assist level: Contact Guard/Touching assist Assistive device: Hand held assist   Walk 50 feet activity   Assist    Assist level: Contact Guard/Touching assist Assistive device: Hand held assist    Walk 150 feet activity   Assist Walk 150 feet activity did not occur: Safety/medical concerns         Walk 10 feet on uneven surface  activity   Assist     Assist level: Moderate Assistance - Patient - 50 - 74% Assistive device: Other (comment), Hand held assist (railing)   Wheelchair     Assist Will patient use wheelchair at discharge?: No             Wheelchair 50 feet with 2 turns activity    Assist            Wheelchair 150 feet activity     Assist          Blood pressure (!) 84/60, pulse 86, temperature 98.7 F (37.1  C), resp. rate 16, height 5\' 4"  (1.626 m), weight 43.3 kg, SpO2 98 %.  Medical Problem List and Plan: 1.  Debility with altered mental status secondary to CAD/anterior STEMI /cardiogenic shock with occluded proximal LAD status post stenting post subacute left frontal infarct             -patient may shower             -ELOS/Goals: 16-20 days, Mod I with PT/OT, supervision SLP  Con't CIR_ PT, OT and SLP 2.  Antithrombotics: -DVT/anticoagulation: Lovenox             -antiplatelet therapy: Aspirin 81 mg daily, Brilinta 90 mg twice daily 3. Pain Management: Oxycodone as needed 4. Mood: Klonopin 0.5 mg 3 times daily             -antipsychotic agents: N/A 5. Neuropsych: This patient is not capable of making decisions on his own behalf.             -is fall risk, safety plan needed 6. Skin/Wound Care: Routine skin checks 7. Fluids/Electrolytes/Nutrition: Routine in and outs with follow-up chemistries on admit. 8.  Acute hypoxemic respiratory failure.  Patient extubated.  Suspect HCAP completing 7-day course of antibiotic therapy 9.  Right pleural effusion.  Status postthoracentesis 08/01/2020 with 450 cc fluid removal.  Follow-up chest x-ray 08/02/2020 without effusion 10.  Dysphagia.  NPO. NGT feedings currently -f/u MBS, plan per SLP  6/12- Stil NPO- per SLP- on Tfs continuous. - will discuss plan in care team tomorrow 11.  Hypertension.  Coreg 3.125 mg twice daily, Lanoxin 0.125 mg daily Vitals:   08/13/20 2003 08/14/20 0421  BP: (!) 87/63 (!) 84/60  Pulse: 65 86  Resp: 16 16  Temp: 98.3 F (36.8 C) 98.7 F (37.1 C)  SpO2: 100% 98%   BPs soft  BMET without sig changes but negative output add free H20  12.  Hyperlipidemia.  Crestor 13.  New findings diabetes mellitus.  Hemoglobin A1c 6.5.  SSI.  NovoLog 3 units every 4 hours.  Diabetic teaching CBG (last 3)  Recent Labs    08/13/20 1955 08/13/20 2357 08/14/20 0417  GLUCAP 108* 182* 129*   Control is fair/good 6/14  14.  Tobacco  abuse.  NicoDerm patch.  Counseling  15. Vascular congestion/coughing  Improved with albuterol       LOS: 4 days A FACE TO FACE  EVALUATION WAS PERFORMED  Erick Colace 08/14/2020, 7:49 AM

## 2020-08-14 NOTE — Progress Notes (Signed)
Speech Language Pathology Daily Session Note  Patient Details  Name: Vincent Horton MRN: [id number] Date of Birth: 07-Feb-1956  Today's Date: 08/14/2020 SLP Individual Time: 0902-0959 SLP Individual Time Calculation (min): 57 min  Short Term Goals: Week 1: SLP Short Term Goal 1 (Week 1): Pt will name common objects/pictures with 90% accuracy provided min A SLP Short Term Goal 2 (Week 1): Pt will consistently follow 2-3 step commands with min A SLP Short Term Goal 3 (Week 1): Pt will sustain attention to functional tasks for 5-7 minutes with mod A SLP Short Term Goal 4 (Week 1): Pt will utilize compensatory strategies as trained to increase intelligibility at word/phrase level with max A SLP Short Term Goal 5 (Week 1): Pt will tolerate ice chip trials utilizing compensatory strategies with minimal s/s aspiration provided mod A cues SLP Short Term Goal 6 (Week 1): Pt will participate in further testing of cognitive communication function  Skilled Therapeutic Interventions:Skilled ST services focused on swallow and language skills. Pt was awake and alert during session with sustained attention in 10 minute intervals. Pt completed oral care with min A for thoroughness. SLP facilitated PO consumption of small-medium size ice chips, 1/2 TSP, TSP and small cup sips. Pt demonstrated x1 wet vocal quality and x1 delayed throat clear in 10 trials of ice chips, 3/3 trials of 1/2 TSP no overt s/s aspiration, 1/5 trials of TSP demonstrated delayed cough and cup sips demonstrated immediate cough. Pt continued to demonstrate poor insight into deficits, consumed large and consecutive sips when instructed to consume small sip and SLP had to remove cup from pt. Pt's cough appeared ineffective in projecting aspirate. Pt was able to identify common objects in field of 2 and 3 with 100% accuracy. Pt was able to name common objects in English and in Somalin in 6 out 10 opportunities with min A semantic and demonstration cues. Pt  required max A verbal cues to increase vocal intensity and extra time to connect words at word/phrase level. Pt requested in Somalin to call son, SLP provided and dialed number. Pt was left in room with call bell within reach and bed alarm set. SLP recommends to continue skilled services.     Pain Pain Assessment Pain Scale: 0-10 Pain Score: 0-No pain  Therapy/Group: Individual Therapy  Alianys Chacko  [facility] 08/14/2020, 1:28 PM

## 2020-08-15 ENCOUNTER — Inpatient Hospital Stay (HOSPITAL_COMMUNITY): Payer: Medicare Other

## 2020-08-15 LAB — GLUCOSE, CAPILLARY
Glucose-Capillary: 116 mg/dL — ABNORMAL HIGH (ref 70–99)
Glucose-Capillary: 116 mg/dL — ABNORMAL HIGH (ref 70–99)
Glucose-Capillary: 147 mg/dL — ABNORMAL HIGH (ref 70–99)
Glucose-Capillary: 158 mg/dL — ABNORMAL HIGH (ref 70–99)
Glucose-Capillary: 159 mg/dL — ABNORMAL HIGH (ref 70–99)
Glucose-Capillary: 173 mg/dL — ABNORMAL HIGH (ref 70–99)

## 2020-08-15 MED ORDER — SORBITOL 70 % SOLN
30.0000 mL | Freq: Every day | Status: DC | PRN
  Administered 2020-08-15 – 2020-08-16 (×2): 30 mL via ORAL
  Filled 2020-08-15 (×2): qty 30

## 2020-08-15 NOTE — Progress Notes (Signed)
Occupational Therapy Session Note  Patient Details  Name: Vincent Horton MRN: [id number] Date of Birth: [birth year]  Today's Date: 08/15/2020 OT Individual Time: 1102-1200 OT Individual Time Calculation (min): 58 min    Short Term Goals: Week 1:  OT Short Term Goal 1 (Week 1): Pt will following 2-step directions during functional ADL tasks with min prompting. OT Short Term Goal 2 (Week 1): Pt will complete toileting task with CGA. OT Short Term Goal 3 (Week 1): Pt will complete LB dressing with CGA. OT Short Term Goal 4 (Week 1): Pt will complete functional transfers during ADLs with CGA.  Skilled Therapeutic Interventions/Progress Updates:    Pt in bed to start with video interpreter utilized for session.  He reports changing clothing this morning, so ADL was not attempted.  Had him transfer to the EOB with supervision for donning his shoes.  Mod instructional cueing from interpreter to initiate task as when handed to him by therapist, he just looked at them and set them down on the floor.  He was able to donn them with min assist for tying them securely.  He was able to complete functional mobility down to the elevators without assistive device and overall mod assist.  HR at 95 with O2 sats at 94% on room air while resting.  Finished with functional mobility to the dayroom with mod assist and worked on cognitive problem solving and balance with use of the [employer].  He was able to complete  tossing and picking up the corn hole bags with overall min assist.  Mod questioning cueing to complete simple addition to manage the total number of points after 4 rounds of tossing.  Rest breaks given after standing approximately 2-3 mins.  Finished session with ambulation back to the room at min to mod assist.  He needed mod instructional cueing for locating his room number once on the [place] hall.  Finished session with return to supine after removal of shoes with call button and phone in reach and safety  alarm in place.  Pt donned his zip up sweat shirt in the bed but needed assist with zipping secondary to decreased FM coordination.  NT in room as well.    Therapy Documentation Precautions:  Precautions Precautions: Fall, Other (comment) Precaution Comments: cortrak, slightly impulsive Restrictions Weight Bearing Restrictions: No  Pain: Pain Assessment Pain Scale: Faces Pain Score: 0-No pain ADL: See Care Tool Section for some details of mobility and selfcare   Therapy/Group: Individual Therapy  Ryder Chesmore OTR/L 08/15/2020, 12:18 PM

## 2020-08-15 NOTE — Progress Notes (Signed)
Patient's cortrak clogged, per night shift nurse. Attempted to unclog cortrak with no success, charge nurse also attempted. Cortrak team paged.

## 2020-08-15 NOTE — Progress Notes (Signed)
Physical Therapy Session Note  Patient Details  Name: Vincent Horton MRN: [id number] Date of Birth: 02/04/1956  Today's Date: 08/15/2020 PT Individual Time: 3086-5784; 6962-9528  PT Individual Time Calculation (min): 55 min and 17 mins Patient missed 13 mins d/t fatigue  Short Term Goals: Week 1:  PT Short Term Goal 1 (Week 1): Pt will perform sit<>stands using LRAD with CGA PT Short Term Goal 2 (Week 1): Pt will perform bed<>chair transfers using LRAD with CGA PT Short Term Goal 3 (Week 1): Pt will ambulate at least 19ft using LRAD with CGA PT Short Term Goal 4 (Week 1): Pt will ascend/descend 4 steps using HRs per home set-up with CGA  Skilled Therapeutic Interventions/Progress Updates:    Session 1: Patient received supine in bed, asleep, but easy to wake, agreeable to PT with max encouragement. Stratus interpretor used throughout session. He denies pain, but endorses fatigue. He was able to come sit edge of bed with supervision and HOB elevated. Donning socks/shoes with MinA to tie shoes only. Patient ambulating to sink with no AD and MinA- NBOS and some scissoring noted. Patient able to brush teeth with set up assist + grossly CGA to maintain balance. Patient requesting to use bathroom and was able to ambulate into bathroom with MinA, increasing ataxia noted when turning around tight turns. Continent of bladder and able to complete clothing management in standing with CGA to maintain balance. PT transporting patient in wc to therapy gym for time management and energy conservation. Standing toe taps onto 6" step with grossly MinA. Patient with increased LOB when weight bearing on L LE and poor righting reactions noted to attempt to correct LOB. PT adding 2# ankle weights B to complete same task to improve proprioceptive input with mild improvement in ataxia. Patient ambulating between cones with 2# ankle weights B, no AD and MinA. Patient with initially strong anterior weight shift and very NBOS with  limited response to cues to correct. Patient with no overt increase in LOB when turning around cones, but general instability noted throughout. With 2nd trial, patient with improved weight distribution and minimized anterior displacement. Patient ambulating 32ft with no AD and MinA + 2# ankle weights. Toward end of gait bout, patient regressing to ModA due to significant increase in ataxia B and scissoring with very NBOS and very poor awareness of this and limited ability to correct. Patient ambulating additional bout with no ankle weights and minimal improvement in demonstrated ataxia. Patient returning to room in wc, transferring to bed via stand pivot with MinA. Bed alarm on, call light within reach.   Session 2: Patient received reclined in bed agreeable to bedlevel therapy due to fatigue. He denies pain, but was requesting water. PT educating patient on current NPO status and risk of aspiration/further complications. PT providing oral care to patient with mouth swab for some relief. Patient able to be guided through effective cough exercises including huffing, diaphragmatic breathing and assisted cough supine- patient declined to roll into sidelying. Patient then stating "I had two workouts this morning, I'm too tired" and requested to discontinue therapy. Bed alarm on, call light within reach.    Therapy Documentation Precautions:  Precautions Precautions: Fall, Other (comment) Precaution Comments: cortrak, slightly impulsive Restrictions Weight Bearing Restrictions: No    Therapy/Group: Individual Therapy  Elizebeth Koller, PT, DPT, CBIS  08/15/2020, 7:36 AM

## 2020-08-15 NOTE — Progress Notes (Addendum)
Speech Language Pathology Daily Session Note  Patient Details  Name: Vincent Horton MRN: [id number] Date of Birth: 01-14-1956  Today's Date: 08/15/2020 SLP Individual Time: 1400-1440, 1200-1230  SLP Individual Time Calculation (min): 40 min and 30 min   Short Term Goals: Week 1: SLP Short Term Goal 1 (Week 1): Pt will name common objects/pictures with 90% accuracy provided min A SLP Short Term Goal 2 (Week 1): Pt will consistently follow 2-3 step commands with min A SLP Short Term Goal 3 (Week 1): Pt will sustain attention to functional tasks for 5-7 minutes with mod A SLP Short Term Goal 4 (Week 1): Pt will utilize compensatory strategies as trained to increase intelligibility at word/phrase level with max A SLP Short Term Goal 5 (Week 1): Pt will tolerate ice chip trials utilizing compensatory strategies with minimal s/s aspiration provided mod A cues SLP Short Term Goal 6 (Week 1): Pt will participate in further testing of cognitive communication function  Skilled Therapeutic Interventions: 1# Skilled ST services focused on speech and swallow skills. SLP facilitated assessment of respiratory strength with RMT, pt scored 20 MEP, which is below normal limits of 30 MEP. SLP recommends EMST set at 14cm H20 to increase vocal intensity, improve cough effectiveness and swallow function. SLP completed oral care prior to x3 ice chip and x3 TSP of thin liquids, pt demonstrated mild delay in AP transport and no overt s/s aspiration. Pt was left in room with call bell within reach and bed alarm set. SLP recommends to continue skilled services.  2# Skilled ST services focused on education swallow and cognitive skills. SLP facilitated orientation, pt only orientated to self, month and general kind of place "doctors." SLP provided education pertaining to situation, place and time. Pt was able to return demonstration of EMST set originally at 14 cm H20, however was downgraded to 11 cm H20 in which pt completed x10  with mod A verbal cues for strong breaths. Pt continues to demonstrate impaired awareness, with a self-perception effortful level of 1 out 10, although exhale dramatically decreased in strength after 5 repetitions. SLP provided oral care prior to x3 TSP of thin liquids, pt demonstrated x1 delayed throat clear. SLP recommends repeat MBS end of the week. Family arrived at the end of the session and with the pt's permission, SLP provided education on current swallow, speech and cognitive deficits, as well as to encourage use of EMST and awareness of severity of deficits/needs to continue stay in CIR. All questions answered to satisfaction. Pt was left in room with family call bell within reach and bed alarm set. SLP recommends to continue skilled services.     Pain Pain Assessment Pain Scale: Faces Pain Score: 0-No pain  Therapy/Group: Individual Therapy  Dustina Scoggin  [facility] 08/15/2020, 2:57 PM

## 2020-08-15 NOTE — Progress Notes (Signed)
PROGRESS NOTE   Subjective/Complaints:  No issues overnight, patient seen in physical therapy, forward flexed posture able to ambulate with therapy supervision through: Obstacles.  Still n.p.o., core track feeding tube clogged today   ROS: Limited due to language/cognition   Objective:   DG Chest Port 1 View  Result Date: 08/14/2020 CLINICAL DATA:  Productive cough EXAM: PORTABLE CHEST 1 VIEW COMPARISON:  08/11/2020, 08/02/2020 FINDINGS: Esophageal tube tip extends below diaphragm but is incompletely visualized. Improved aeration of left base with better visualization of diaphragm. Minimal hazy infrahilar opacities. Normal cardiomediastinal silhouette. No pneumothorax. IMPRESSION: Slightly improved aeration at left base. There are mild residual infrahilar hazy opacities which may be due to atelectasis, mild residual edema, or pneumonia. Electronically Signed   By: Jasmine Pang M.D.   On: 08/14/2020 15:11   Recent Labs    08/13/20 0638  WBC 8.5  HGB 10.6*  HCT 33.5*  PLT 327    Recent Labs    08/13/20 0638  NA 133*  K 4.5  CL 97*  CO2 30  GLUCOSE 145*  BUN 26*  CREATININE 0.75  CALCIUM 8.4*     Intake/Output Summary (Last 24 hours) at 08/15/2020 0938 Last data filed at 08/14/2020 2242 Gross per 24 hour  Intake --  Output 1250 ml  Net -1250 ml          Physical Exam: Vital Signs Blood pressure (!) 90/58, pulse 81, temperature 98 F (36.7 C), resp. rate 18, height 5\' 4"  (1.626 m), weight 43.6 kg, SpO2 99 %.  General: No acute distress Mood and affect are appropriate Heart: Regular rate and rhythm no rubs murmurs or extra sounds Lungs: Clear to auscultation, breathing unlabored, no rales or wheezes Abdomen: Positive bowel sounds, soft nontender to palpation, nondistended Extremities: No clubbing, cyanosis, or edema Skin: No evidence of breakdown, no evidence of rash Musculoskeletal:        General: No  swelling. Normal range of motion.    Cervical back: Normal range of motion. Skin:    General: Skin is warm. Neurological:    Mental Status: He is alert.    Comments: Patient is alert.  With assistance of video interpreter he does follow simple commands.  Provides year in person but decreased awareness and insight as a whole  Patient is dysphonic. Moves all 4 limbs. Senses pain.      Assessment/Plan: 1. Functional deficits which require 3+ hours per day of interdisciplinary therapy in a comprehensive inpatient rehab setting. Physiatrist is providing close team supervision and 24 hour management of active medical problems listed below. Physiatrist and rehab team continue to assess barriers to discharge/monitor patient progress toward functional and medical goals  Care Tool:  Bathing    Body parts bathed by patient: Right arm, Left arm, Chest, Abdomen, Front perineal area, Buttocks, Right upper leg, Left upper leg, Face, Left lower leg, Right lower leg         Bathing assist Assist Level: Minimal Assistance - Patient > 75%     Upper Body Dressing/Undressing Upper body dressing   What is the patient wearing?: Pull over shirt    Upper body assist Assist Level: Set up assist  Lower Body Dressing/Undressing Lower body dressing      What is the patient wearing?: Incontinence brief, Pants     Lower body assist Assist for lower body dressing: Minimal Assistance - Patient > 75%     Toileting Toileting    Toileting assist Assist for toileting: Moderate Assistance - Patient 50 - 74%     Transfers Chair/bed transfer  Transfers assist     Chair/bed transfer assist level: Minimal Assistance - Patient > 75%     Locomotion Ambulation   Ambulation assist      Assist level: Minimal Assistance - Patient > 75% Assistive device: No Device Max distance: 158ft   Walk 10 feet activity   Assist     Assist level: Minimal Assistance - Patient > 75% Assistive device:  No Device   Walk 50 feet activity   Assist    Assist level: Minimal Assistance - Patient > 75% Assistive device: No Device    Walk 150 feet activity   Assist Walk 150 feet activity did not occur: Safety/medical concerns  Assist level: Minimal Assistance - Patient > 75% Assistive device: No Device    Walk 10 feet on uneven surface  activity   Assist     Assist level: Moderate Assistance - Patient - 50 - 74% Assistive device: Other (comment), Hand held assist (railing)   Wheelchair     Assist Will patient use wheelchair at discharge?: No             Wheelchair 50 feet with 2 turns activity    Assist            Wheelchair 150 feet activity     Assist          Blood pressure (!) 90/58, pulse 81, temperature 98 F (36.7 C), resp. rate 18, height 5\' 4"  (1.626 m), weight 43.6 kg, SpO2 99 %.  Medical Problem List and Plan: 1.  Debility with altered mental status secondary to CAD/anterior STEMI /cardiogenic shock with occluded proximal LAD status post stenting post subacute left frontal infarct             -patient may shower             -ELOS/Goals: 16-20 days, Mod I with PT/OT, supervision SLP  Con't CIR_ PT, OT and SLP 2.  Antithrombotics: -DVT/anticoagulation: Lovenox             -antiplatelet therapy: Aspirin 81 mg daily, Brilinta 90 mg twice daily 3. Pain Management: Oxycodone as needed 4. Mood: Klonopin 0.5 mg 3 times daily             -antipsychotic agents: N/A 5. Neuropsych: This patient is not capable of making decisions on his own behalf.             -is fall risk, safety plan needed 6. Skin/Wound Care: Routine skin checks 7. Fluids/Electrolytes/Nutrition: Routine in and outs with follow-up chemistries on admit. 8.  Acute hypoxemic respiratory failure.  Patient extubated.  Suspect HCAP completing 7-day course of antibiotic therapy 9.  Right pleural effusion.  Status postthoracentesis 08/01/2020 with 450 cc fluid removal.  Follow-up  chest x-ray 08/02/2020 without effusion 10.  Dysphagia.  NPO. NGT feedings currently -f/u MBS, plan per SLP  6/12- Stil NPO- per SLP- on Tfs continuous. - will discuss plan in care team today  11.  Hypertension.  Coreg 3.125 mg twice daily, Lanoxin 0.125 mg daily Vitals:   08/14/20 2210 08/15/20 0421  BP:  (!) 90/58  Pulse:  81  Resp:  18  Temp:  98 F (36.7 C)  SpO2: 97% 99%   BPs soft  BMET without sig changes but negative output add free H20  12.  Hyperlipidemia.  Crestor 13.  New findings diabetes mellitus.  Hemoglobin A1c 6.5.  SSI.  NovoLog 3 units every 4 hours.  Diabetic teaching CBG (last 3)  Recent Labs    08/14/20 1956 08/15/20 0001 08/15/20 0420  GLUCAP 115* 147* 158*   Control is good 6/15  14.  Tobacco abuse.  NicoDerm patch.  Counseling  15. Vascular congestion/coughing  Improved with albuterol       LOS: 5 days A FACE TO FACE EVALUATION WAS PERFORMED  Erick Colace 08/15/2020, 8:12 AM

## 2020-08-15 NOTE — Procedures (Signed)
Cortrak  Tube Type:  Cortrak - 43 inches Tube Location:  Left nare Initial Placement:  Stomach Secured by: Bridle Technique Used to Measure Tube Placement:  Marking at nare/corner of mouth Cortrak Secured At:  71 cm  Cortrak Tube Team Note:  Consult received to place a Cortrak feeding tube.   X-ray is required, abdominal x-ray has been ordered by the Cortrak team. Please confirm tube placement before using the Cortrak tube.   If the tube becomes dislodged please keep the tube and contact the Cortrak team at [email or web address] (password [email or web address]) for replacement.  If after hours and replacement cannot be delayed, place a NG tube and confirm placement with an abdominal x-ray.    Jaquavious Mercer MS, RD, LDN Please refer to AMION for RD and/or RD on-call/weekend/after hours pager   

## 2020-08-15 NOTE — Patient Care Conference (Signed)
Inpatient RehabilitationTeam Conference and Plan of Care Update Date: 08/15/2020   Time: 10:31 AM    Patient Name: Vincent Horton      Medical Record Number: [id number]  Date of Birth: 11/18/1955 Sex: Male         Room/Bed: 4M08C/4M08C-01 Payor Info: Payor: MEDICARE / Plan: MEDICARE PART A AND B / Product Type: *No Product type* /    Admit Date/Time:  08/10/2020  2:43 PM  Primary Diagnosis:  CVA (cerebral vascular accident) [facility])  Hospital Problems: Principal Problem:   CVA (cerebral vascular accident) [facility])    Expected Discharge Date: Expected Discharge Date: 08/24/20  Team Members Present: Physician leading conference: Dr. Claudette Laws Care Coodinator Present: Chana Bode, RN, BSN, CRRN;Christina Chicago, BSW Nurse Present: Chana Bode, RN PT Present: Merry Lofty, PT OT Present: Perrin Maltese, OT SLP Present: Feliberto Gottron, SLP PPS Coordinator present : Edson Snowball, PT     Current Status/Progress Goal Weekly Team Focus  Bowel/Bladder   Incontinent of bowel and bladder. LBM 08/11/2020  Regain continence  Toilet q2h during day time and Q3h @ hs   Swallow/Nutrition/ Hydration   NPO  Min A  thin liquid trials and plan for MBS   ADL's   Supervision for UB selfcare with min assist for LB selfcare and transfers without an assistive device.  Decreased anticipatory awareness and safety awareness.  supervision overall  selfcare retraining, balance retraining, transfer training neuromuscular re-education, therapeutic exercise, DME education, pt/family education   Mobility   supervision bed mobility, min assist sit<>stand and stand pivot transfers without AD, gait up to 17ft no AD with min assist due to narrow BOS and short shuffled steps causing anterior LOB, and min assist 12 steps using B HRs (pt will not have HRs at home) - pt also demos poor safety awareness and impaired balance  supervision overall at ambulatory level  activity tolerance, dynamic standing balance, B LE  strengthening, dynamic gait training, stair navigation, pt/family education, D/C planning   Communication   word/phrase level max-mod A, reduced vocal intensity  Min A  speech intelligibility strategies, naming objects, idenitfying pictures, following commands   Safety/Cognition/ Behavioral Observations  max-mod A increasing sustained attention,  Min A  orientation, basic problem solving, intellectual awareness, sustained attention and recall   Pain   No c/o pain  Pain <3/10  Assess Qshift and prn   Skin   Scab on chest and abdomen. Remainder of skin intact.  Maintain skin integrity.  Assess Qshift and PRN     Discharge Planning:  d/c home with children to provide care   Team Discussion: Severe dysphagia, cortrak clogged and MBS planned for end of the week. Discharge date pending swallow eval outcome.  Patient able to follow one step commands with instructional cues however would get up without calling if not monitored. Recommend 24/7 supervision at discharge due to poor safety awareness.   Patient on target to meet rehab goals: yes, currently supervision for upper body care and min guard for lower body self care. Note loss of balance to the left with standing and toilet transfers. Min assist for sit- stand without an assistive device. (Family does not want patient to become dependent on assistive devices) Able to ambulate 150' with min - mod assist with a narrow base of support and 12 steps with min assist. Discharge goals set for supervision level.  *See Care Plan and progress notes for long and short-term goals.   Revisions to Treatment Plan:  Working on basic communication  and vocal intensity   Teaching Needs: Swallowing safety, medication management, transfers, toileting, balance strategies, nutritional means, etc.  Current Barriers to Discharge: Decreased caregiver support, Home enviroment access/layout, and Nutritional means  Possible Resolutions to Barriers: Family  education     Medical Summary Current Status: Core track is clogged, still n.p.o., cooperating well with physical therapy  Barriers to Discharge: Medical stability;Nutrition means   Possible Resolutions to [employer] Focus: Hope to [employer], will need to make decision regarding PEG   Continued Need for Acute Rehabilitation Level of Care: The patient requires daily medical management by a physician with specialized training in physical medicine and rehabilitation for the following reasons: Direction of a multidisciplinary physical rehabilitation program to maximize functional independence : Yes Medical management of patient stability for increased activity during participation in an intensive rehabilitation regime.: Yes Analysis of laboratory values and/or radiology reports with any subsequent need for medication adjustment and/or medical intervention. : Yes   I attest that I was present, lead the team conference, and concur with the assessment and plan of the team.   Chana Bode B 08/15/2020, 12:28 PM

## 2020-08-16 LAB — GLUCOSE, CAPILLARY
Glucose-Capillary: 151 mg/dL — ABNORMAL HIGH (ref 70–99)
Glucose-Capillary: 116 mg/dL — ABNORMAL HIGH (ref 70–99)
Glucose-Capillary: 156 mg/dL — ABNORMAL HIGH (ref 70–99)
Glucose-Capillary: 100 mg/dL — ABNORMAL HIGH (ref 70–99)
Glucose-Capillary: 180 mg/dL — ABNORMAL HIGH (ref 70–99)

## 2020-08-16 MED ORDER — OSMOLITE 1.5 CAL PO LIQD
1000.0000 mL | ORAL | Status: DC
  Administered 2020-08-16: 1000 mL
  Filled 2020-08-16 (×2): qty 1000

## 2020-08-16 MED ORDER — OXYCODONE HCL 5 MG PO TABS
5.0000 mg | ORAL_TABLET | Freq: Three times a day (TID) | ORAL | Status: DC | PRN
  Administered 2020-08-16: 5 mg
  Filled 2020-08-16: qty 1

## 2020-08-16 NOTE — Progress Notes (Signed)
Occupational Therapy Session Note  Patient Details  Name: Vincent Horton MRN: [id number] Date of Birth: Jul 26, 1955  Today's Date: 08/16/2020 OT Individual Time: 6712-4580 OT Individual Time Calculation (min): 59 min    Short Term Goals: Week 1:  OT Short Term Goal 1 (Week 1): Pt will following 2-step directions during functional ADL tasks with min prompting. OT Short Term Goal 2 (Week 1): Pt will complete toileting task with CGA. OT Short Term Goal 3 (Week 1): Pt will complete LB dressing with CGA. OT Short Term Goal 4 (Week 1): Pt will complete functional transfers during ADLs with CGA.  Skilled Therapeutic Interventions/Progress Updates:    Pt in bed to start session with interpreter present for session.  He was able to transfer to the EOB with supervision and change his shirt and zip up sweatshirt.  He was able to donn his shoes with setup and then complete functional mobility down to the therapy gym with min assist.  Frequent scissoring of his LEs with LOB to both the right and left.  Once in the gym had him work on dynamic standing balance with use of 4" step.  He worked on toe taps, standing with one foot up on the steps, and also standing with one foot up and tossing a beach ball to himself.  He needed min to mod assist with frequent LOB to the right when attempting to place the LLE on the step and bring it back.  His ability to keep his balance improved as he completed more repetitions from 1-2 min intervals of standing time.  He was able to complete ball toss and catch while sitting with a small beach ball and also played beach volleyball using a 2 lb dowel rod with reps up to 20.  Finished session with functional mobility back to the room at min assist level where he returned to the bed with supervision.  Call button and phone in reach with safety alarm in place.     Therapy Documentation Precautions:  Precautions Precautions: Fall, Other (comment) Precaution Comments: cortrak, slightly  impulsive Restrictions Weight Bearing Restrictions: No   Pain: Pain Assessment Pain Scale: 0-10 Pain Score: 0-No pain ADL: See Care Tool Section for some details of mobility and selfcare  Therapy/Group: Individual Therapy  Aissatou Fronczak OTR/L 08/16/2020, 3:57 PM

## 2020-08-16 NOTE — Progress Notes (Signed)
Speech Language Pathology Daily Session Note  Patient Details  Name: Vincent Horton MRN: [id number] Date of Birth: Jul 17, 1955  Today's Date: 08/16/2020 SLP Individual Time: 2542-7062 SLP Individual Time Calculation (min): 55 min  Short Term Goals: Week 1: SLP Short Term Goal 1 (Week 1): Pt will name common objects/pictures with 90% accuracy provided min A SLP Short Term Goal 2 (Week 1): Pt will consistently follow 2-3 step commands with min A SLP Short Term Goal 3 (Week 1): Pt will sustain attention to functional tasks for 5-7 minutes with mod A SLP Short Term Goal 4 (Week 1): Pt will utilize compensatory strategies as trained to increase intelligibility at word/phrase level with max A SLP Short Term Goal 5 (Week 1): Pt will tolerate ice chip trials utilizing compensatory strategies with minimal s/s aspiration provided mod A cues SLP Short Term Goal 6 (Week 1): Pt will participate in further testing of cognitive communication function  Skilled Therapeutic Interventions:   Patient seen for skilled ST session focusing on cognitive and swallow function goals. Patient was asleep but awoke without difficulty and able to remain alert and attentive throughout session. He was oriented being in the "hospital" but not oriented to time ("2012"). SLP observed that patient's tube feeding was leaking into bed and RN alerted, who came and replaced tubing. SLP performed oral care and then instructed patient to complete EMST exercise. He was able to perform adequately however his endurance started to wane after first 4-5 reps (11cm resistance). He then was given single ice chips (total of 6) which he masticated and swallowed without overt s/s aspiration or penetration. SLP explained to patient plan for MBS next date. Patient was vague in descriptions of therapy tasks completed today and he then asked why he couldn't go home and come back in the morning like he sees others do. SLP explained that all patient's remain here  until discharge and only staff will be leaving and coming back. Patient did not demonstrate understanding. He then told SLP that he had already had a swallowing test and he did fine. SLP explained to patient that he did not do fine and that is why he isnt able to eat by mouth. Patient then started to become sleepy and had difficulty keeping eyes open, however this was at very end of session. Patient continues to benefit from skilled SLP intervention to maximize cognitive-linguistic and swallow function prior to discharge.  Pain Pain Assessment Pain Scale: 0-10 Pain Score: 0-No pain  Therapy/Group: Individual Therapy  Angela Nevin, MA, CCC-SLP Speech Therapy

## 2020-08-16 NOTE — Progress Notes (Signed)
PROGRESS NOTE   Subjective/Complaints: Patient's chart reviewed- No issues reported overnight Vitals signs stable  Sleepy  ROS: Limited due to language/cognition   Objective:   DG Chest Port 1 View  Result Date: 08/14/2020 CLINICAL DATA:  Productive cough EXAM: PORTABLE CHEST 1 VIEW COMPARISON:  08/11/2020, 08/02/2020 FINDINGS: Esophageal tube tip extends below diaphragm but is incompletely visualized. Improved aeration of left base with better visualization of diaphragm. Minimal hazy infrahilar opacities. Normal cardiomediastinal silhouette. No pneumothorax. IMPRESSION: Slightly improved aeration at left base. There are mild residual infrahilar hazy opacities which may be due to atelectasis, mild residual edema, or pneumonia. Electronically Signed   By: Jasmine Pang M.D.   On: 08/14/2020 15:11   DG Abd Portable 1V  Result Date: 08/15/2020 CLINICAL DATA:  65 year old male feeding tube placement. EXAM: PORTABLE ABDOMEN - 1 VIEW COMPARISON:  08/08/2020 and earlier. FINDINGS: Portable AP view at 1019 hours. Enteric tube position not significantly changed, tip at the gastric antrum or duodenal bulb level to the right of the lumbar spine. Visible bowel-gas pattern remains within normal limits. Stable lung bases. No acute osseous abnormality identified. IMPRESSION: Enteric tube tip at the gastric antrum or duodenal bulb, could be advanced for further post pyloric placement. Electronically Signed   By: Odessa Fleming M.D.   On: 08/15/2020 10:32   No results for input(s): WBC, HGB, HCT, PLT in the last 72 hours. No results for input(s): NA, K, CL, CO2, GLUCOSE, BUN, CREATININE, CALCIUM in the last 72 hours.  Intake/Output Summary (Last 24 hours) at 08/16/2020 0826 Last data filed at 08/16/2020 0539 Gross per 24 hour  Intake --  Output 660 ml  Net -660 ml         Physical Exam: Vital Signs Blood pressure 102/69, pulse 80, temperature 98 F  (36.7 C), resp. rate 18, height 5\' 4"  (1.626 m), weight 43.6 kg, SpO2 98 %. Gen: no distress, normal appearing HEENT: oral mucosa pink and moist, NCAT Cardio: Reg rate Chest: normal effort, normal rate of breathing Abd: soft, non-distended Ext: no edema Psych: pleasant, normal affect Musculoskeletal:        General: No swelling. Normal range of motion.    Cervical back: Normal range of motion. Skin:    General: Skin is warm. Neurological:    Mental Status: He is alert.    Comments: Patient is alert.  With assistance of video interpreter he does follow simple commands.  Provides year in person but decreased awareness and insight as a whole  Patient is dysphonic. Moves all 4 limbs. Senses pain.      Assessment/Plan: 1. Functional deficits which require 3+ hours per day of interdisciplinary therapy in a comprehensive inpatient rehab setting. Physiatrist is providing close team supervision and 24 hour management of active medical problems listed below. Physiatrist and rehab team continue to assess barriers to discharge/monitor patient progress toward functional and medical goals  Care Tool:  Bathing    Body parts bathed by patient: Right arm, Left arm, Chest, Abdomen, Front perineal area, Buttocks, Right upper leg, Left upper leg, Face, Left lower leg, Right lower leg         Bathing assist Assist Level: Minimal  Assistance - Patient > 75%     Upper Body Dressing/Undressing Upper body dressing   What is the patient wearing?: Pull over shirt    Upper body assist Assist Level: Set up assist    Lower Body Dressing/Undressing Lower body dressing      What is the patient wearing?: Incontinence brief, Pants     Lower body assist Assist for lower body dressing: Minimal Assistance - Patient > 75%     Toileting Toileting    Toileting assist Assist for toileting: Moderate Assistance - Patient 50 - 74%     Transfers Chair/bed transfer  Transfers assist     Chair/bed  transfer assist level: Minimal Assistance - Patient > 75%     Locomotion Ambulation   Ambulation assist      Assist level: Moderate Assistance - Patient 50 - 74% (no device) Assistive device: No Device Max distance: 200'   Walk 10 feet activity   Assist     Assist level: Minimal Assistance - Patient > 75% Assistive device: No Device   Walk 50 feet activity   Assist    Assist level: Moderate Assistance - Patient - 50 - 74% Assistive device: No Device    Walk 150 feet activity   Assist Walk 150 feet activity did not occur: Safety/medical concerns  Assist level: Minimal Assistance - Patient > 75% Assistive device: No Device    Walk 10 feet on uneven surface  activity   Assist     Assist level: Moderate Assistance - Patient - 50 - 74% Assistive device: Other (comment), Hand held assist (railing)   Wheelchair     Assist Will patient use wheelchair at discharge?: No             Wheelchair 50 feet with 2 turns activity    Assist            Wheelchair 150 feet activity     Assist          Blood pressure 102/69, pulse 80, temperature 98 F (36.7 C), resp. rate 18, height 5\' 4"  (1.626 m), weight 43.6 kg, SpO2 98 %.  Medical Problem List and Plan: 1.  Debility with altered mental status secondary to CAD/anterior STEMI /cardiogenic shock with occluded proximal LAD status post stenting post subacute left frontal infarct             -patient may shower             -ELOS/Goals: 16-20 days, Mod I with PT/OT, supervision SLP  Continue CIR_ PT, OT and SLP 2.  Impaired mobility: continue Lovenox             -antiplatelet therapy: Aspirin 81 mg daily, Brilinta 90 mg twice daily 3. Pain Management: Denies pain. Change Oxycodone to as needed 4. Anxiety: Continue Klonopin 0.5 mg 3 times daily             -antipsychotic agents: N/A 5. Neuropsych: This patient is not capable of making decisions on his own behalf.             -is fall risk,  safety plan needed 6. Skin/Wound Care: Routine skin checks 7. Fluids/Electrolytes/Nutrition: Routine in and outs with follow-up chemistries on admit. 8.  Acute hypoxemic respiratory failure.  Patient extubated.  Suspect HCAP completing 7-day course of antibiotic therapy 9.  Right pleural effusion.  Status postthoracentesis 08/01/2020 with 450 cc fluid removal.  Follow-up chest x-ray 08/02/2020 without effusion 10.  Dysphagia.  NPO. NGT feedings currently -f/u  MBS, plan per SLP  6/12- Stil NPO- per SLP- on Tfs continuous. - will discuss plan w/ team 11.  Hypertension.  Coreg 3.125 mg twice daily, Lanoxin 0.125 mg daily Vitals:   08/15/20 1925 08/16/20 0454  BP: 101/61 102/69  Pulse: 86 80  Resp: 16 18  Temp: 98.7 F (37.1 C) 98 F (36.7 C)  SpO2: 98% 98%   BPs soft  BMET without sig changes but negative output add free H20  12.  Hyperlipidemia.  Crestor 13.  New findings diabetes mellitus.  Hemoglobin A1c 6.5.  SSI.  NovoLog 3 units every 4 hours.  Diabetic teaching CBG (last 3)  Recent Labs    08/15/20 1959 08/15/20 2349 08/16/20 0435  GLUCAP 159* 116* 151*  Control is good 6/16  14.  Tobacco abuse.  NicoDerm patch.  Counseling  15. Vascular congestion/coughing  Improved with albuterol       LOS: 6 days A FACE TO FACE EVALUATION WAS PERFORMED  Clint Bolder P Mackenzy Eisenberg 08/16/2020, 8:26 AM

## 2020-08-16 NOTE — Progress Notes (Signed)
Physical Therapy Session Note  Patient Details  Name: Vincent Horton MRN: [id number] Date of Birth: 08-25-1955  Today's Date: 08/16/2020 PT Individual Time: 7989-2119 PT Individual Time Calculation (min): 55 min   Short Term Goals: Week 1:  PT Short Term Goal 1 (Week 1): Pt will perform sit<>stands using LRAD with CGA PT Short Term Goal 2 (Week 1): Pt will perform bed<>chair transfers using LRAD with CGA PT Short Term Goal 3 (Week 1): Pt will ambulate at least 170ft using LRAD with CGA PT Short Term Goal 4 (Week 1): Pt will ascend/descend 4 steps using HRs per home set-up with CGA  Skilled Therapeutic Interventions/Progress Updates:    Patient received supine in bed, asleep, but easy to wake, agreeable to PT. He denies pain. Stratus interpretor unable to connect. He was able to come sit edge of bed with supervision and don socks/shoes seated edge of bed with supervision. Patient transferring to wc via stand pivot with CGA. PT transporting patient in wc to therapy gym for time management and energy conservation. He was able to negotiate x4 stairs with B HR and CGA and again with no HR and MinA. Patient unable to confirm whether he has hand rails at home or not. Patient standing on foam completing peg board task with CGA/MinA to maintain balance and max cues to complete task accurately. Despite max multimodal cues and limited color options provided, patient still unable to complete peg board task accurately. He demonstrated slight R lateral lean bias in standing with no LOB, but no attempt to maintain equal weight distribution either. Patient ambulating 58ft with 2# ankle weights B + MinA. Anterior weight displacement noted with patient ambulating on toes- not receptive to multimodal cues to adjust weight shift. Patient impulsively crouching down on floor to adjust shoe with MinA to guard throughout. Patient ambulating 61ft x4 straddling wood plank to further emphasize appropriate BOS when ambulating. R LE  more likely to adduct inappropriately than L at times, but no LOB. CGA provided with 2# ankle weights. Patient completing same gait task with no ankle weights and decreased anterior weight displacement noted, CGA provided. Patient progressing to ambulating additional 50ft with no ankle weights and was able to maintain appropriate BOS ~50% of the time, but less evidence of scissoring compared to previous sessions. Patient returning to room in wc, transferring to bed via stand pivot with CGA. Bed alarm on, call light within reach, handoff to NT.    Therapy Documentation Precautions:  Precautions Precautions: Fall, Other (comment) Precaution Comments: cortrak, slightly impulsive Restrictions Weight Bearing Restrictions: No    Therapy/Group: Individual Therapy  Elizebeth Koller, PT, DPT, CBIS  08/16/2020, 7:33 AM

## 2020-08-16 NOTE — Progress Notes (Addendum)
Nutrition Follow-up  DOCUMENTATION CODES:   Severe malnutrition in context of chronic illness, Underweight  INTERVENTION:  Increase Osmolite 1.5 cal via Cortrak NGT to new goal rate of 55 ml/hr x 20 hours (May hold TF for up to 4 hours for therapy)  Provide 45 ml Prosource TF BID per tube.   Free water flushes of 200 ml q 4 hours per tube. (MD to adjust as appropriate)  Tube feeding regimen provides 1730 kcal, 91 grams of protein, and 2036 ml free water.   NUTRITION DIAGNOSIS:   Severe Malnutrition related to chronic illness as evidenced by severe fat depletion, severe muscle depletion; ongoing  GOAL:   Patient will meet greater than or equal to 90% of their needs; met with TF  MONITOR:   TF tolerance, Weight trends, I & O's, Labs  REASON FOR ASSESSMENT:   Consult Enteral/tube feeding initiation and management  ASSESSMENT:   Pt with limited past medical history who was initially found down by family at home.  EMS was activated and patient was found to be in ventricular fibrillation.  Patient was shocked 5 times and given epinephrine and amiodarone with ROSC obtained approximately within 15 minutes.  Patient was intubated, started on vasopressors for cardiogenic shock and transferred to the intensive care unit.  Patient also was emergently taken to the catheterization lab due to ST segment changes in anterior leads and underwent revascularization of his LAD.  Milrinone drip was slowly weaned off.  Patient was extubated on 07/31/2020 and later transferred to the hospitalist service. Admitted to CIR on 6/10.  Cortrak NGT replaced yesterday. Tip of tube in stomach. RD to increase goal rate of tube feeds to infuse over 20 hours to allow TF to be held for up to 4 hours a day for therapies. Labs and medications reviewed.   Diet Order:   Diet Order             Diet NPO time specified  Diet effective now                   EDUCATION NEEDS:   Not appropriate for education at  this time  Skin:  Skin Assessment: Reviewed RN Assessment  Last BM:  6/11  Height:   Ht Readings from Last 1 Encounters:  08/10/20 5' 4"  (1.626 m)    Weight:   Wt Readings from Last 1 Encounters:  08/16/20 43.6 kg   BMI:  Body mass index is 16.5 kg/m.  Estimated Nutritional Needs:   Kcal:  1500-1700  Protein:  80-105g  Fluid:  >1.5L/d  Corrin Parker, MS, RD, LDN RD pager number/after hours weekend pager number on Amion.

## 2020-08-17 ENCOUNTER — Inpatient Hospital Stay (HOSPITAL_COMMUNITY): Payer: Medicare Other

## 2020-08-17 LAB — CREATININE, SERUM
Creatinine, Ser: 0.71 mg/dL (ref 0.61–1.24)
GFR, Estimated: >60 mL/min (ref >60)

## 2020-08-17 LAB — GLUCOSE, CAPILLARY
Glucose-Capillary: 100 mg/dL — ABNORMAL HIGH (ref 70–99)
Glucose-Capillary: 120 mg/dL — ABNORMAL HIGH (ref 70–99)
Glucose-Capillary: 131 mg/dL — ABNORMAL HIGH (ref 70–99)
Glucose-Capillary: 133 mg/dL — ABNORMAL HIGH (ref 70–99)
Glucose-Capillary: 152 mg/dL — ABNORMAL HIGH (ref 70–99)
Glucose-Capillary: 156 mg/dL — ABNORMAL HIGH (ref 70–99)

## 2020-08-17 MED ORDER — OSMOLITE 1.5 CAL PO LIQD
1000.0000 mL | ORAL | Status: DC
  Administered 2020-08-17 – 2020-08-18 (×2): 1000 mL
  Filled 2020-08-17 (×2): qty 1000

## 2020-08-17 MED ORDER — PROSOURCE TF PO LIQD
45.0000 mL | Freq: Three times a day (TID) | ORAL | Status: DC
  Administered 2020-08-17 – 2020-08-19 (×6): 45 mL
  Filled 2020-08-17 (×6): qty 45

## 2020-08-17 NOTE — Progress Notes (Signed)
Speech Language Pathology Weekly Progress and Session Note  Patient Details  Name: Vincent Horton MRN: [id number] Date of Birth: 02-Aug-1955  Beginning of progress report period: 08/11/2020  End of progress report period:  08/17/2020  Today's Date: 08/17/2020 SLP Individual Time: 0930-1000 SLP Individual Time Calculation (min): 30 min  Short Term Goals: Week 1: SLP Short Term Goal 1 (Week 1): Pt will name common objects/pictures with 90% accuracy provided min A SLP Short Term Goal 1 - Progress (Week 1): Progressing toward goal SLP Short Term Goal 2 (Week 1): Pt will consistently follow 2-3 step commands with min A SLP Short Term Goal 2 - Progress (Week 1): Not met SLP Short Term Goal 3 (Week 1): Pt will sustain attention to functional tasks for 5-7 minutes with mod A SLP Short Term Goal 3 - Progress (Week 1): Met SLP Short Term Goal 4 (Week 1): Pt will utilize compensatory strategies as trained to increase intelligibility at word/phrase level with max A SLP Short Term Goal 4 - Progress (Week 1): Met SLP Short Term Goal 5 (Week 1): Pt will tolerate ice chip trials utilizing compensatory strategies with minimal s/s aspiration provided mod A cues SLP Short Term Goal 5 - Progress (Week 1): Met SLP Short Term Goal 6 (Week 1): Pt will participate in further testing of cognitive communication function SLP Short Term Goal 6 - Progress (Week 1): Progressing toward goal    New Short Term Goals: Week 2: SLP Short Term Goal 1 (Week 2): STG-=LTG  Weekly Progress Updates:  Patient met 3/6 STG's and is making progress towards goals he did not meet. Focus of SLP intervention has been largely on dysphagia goals as patient has been NPO with Cortrak. MBS was completed 6/17 and improvement in swallow function observed resulting in patient being able to advance from NPO to PO diet of Dys 1 solids, nectar thick liquids. Plan will be for repeat MBS prior to discharge.   Intensity: Minumum of 1-2 x/day, 30 to 90  minutes Frequency: 3 to 5 out of 7 days Duration/Length of Stay: 6/24 Treatment/Interventions: Cognitive remediation/compensation;Multimodal communication approach;Speech/Language facilitation;Therapeutic Activities;Therapeutic Exercise;Functional tasks;Cueing hierarchy;Internal/external aids;Dysphagia/aspiration precaution training;Medication managment;Patient/family education     Sonia Baller, MA, CCC-SLP Speech Therapy

## 2020-08-17 NOTE — Progress Notes (Signed)
Nutrition Follow-up  DOCUMENTATION CODES:   Severe malnutrition in context of chronic illness, Underweight  INTERVENTION:   Transition to nocturnal tube feeds via Cortrak: - Osmolite 1.5 @ 65 ml/hr x 12 hours from 1800 to 0600 (total of 780 ml) - ProSource TF 45 ml TID - Free water flushes of 200 ml q 4 hours  Nocturnal tube feeding regimlunch today, transition to noc TF via Cortrak: Osm 1.5 @ 65 x 12 hrs, PS x 3, FWF, Hormel TIDen provides 1290 kcal, 81 grams of protein, and 594 ml of H2O (meets 86% of kcal needs and 100% of protein needs).  Total free water with flushes: 1794 ml  - Vital Cuisine Shake TID, each supplement provides 520 kcal and 22 grams of protein  - Encourage PO intake  NUTRITION DIAGNOSIS:   Severe Malnutrition related to chronic illness as evidenced by severe fat depletion, severe muscle depletion.  Ongoing, being addressed via TF and diet advancement  GOAL:   Patient will meet greater than or equal to 90% of their needs  Progressing  MONITOR:   TF tolerance, Weight trends, I & O's, Labs  REASON FOR ASSESSMENT:   Consult Enteral/tube feeding initiation and management  ASSESSMENT:   Pt with limited past medical history who was initially found down by family at home. EMS was activated and patient was found to be in ventricular fibrillation. Patient was shocked 5 times and given epinephrine and amiodarone with ROSC obtained approximately within 15 minutes. Patient was intubated, started on vasopressors for cardiogenic shock and transferred to the intensive care unit. Patient also was emergently taken to the catheterization lab due to ST segment changes in anterior leads and underwent revascularization of his LAD. Milrinone drip was slowly weaned off. Patient was extubated on 07/31/2020 and later transferred to the hospitalist service. Admitted to CIR on 6/10.  6/17 - s/p MBS, diet advanced to dysphagia 1 with nectar-thick liquids  Discussed pt with RN,  SLP, and MD. Pt ate well at lunch meal but only consumed the sides as he is Muslim and received pork. Pt's family requesting pt be placed on vegetarian diet.  Plan to transition pt to nocturnal tube feeds now that diet has been advanced. RD to order oral nutrition supplements to aid pt in meeting kcal and protein needs via PO route. Discussed plan with pt's family and RN.  CIR admit weight: 45.3 kg Current weight: 43.6 kg  Medications reviewed and include: pepcid, SSI q 4 hours  Labs reviewed: sodium 133, BUN 26 CBG's: 100-180 x 24 hours  Diet Order:   Diet Order             DIET - DYS 1 Room service appropriate? Yes; Fluid consistency: Nectar Thick  Diet effective now                   EDUCATION NEEDS:   Not appropriate for education at this time  Skin:  Skin Assessment: Reviewed RN Assessment  Last BM:  08/16/20 large type 4  Height:   Ht Readings from Last 1 Encounters:  08/10/20 5\' 4"  (1.626 m)    Weight:   Wt Readings from Last 1 Encounters:  08/16/20 43.6 kg    BMI:  Body mass index is 16.5 kg/m.  Estimated Nutritional Needs:   Kcal:  1500-1700  Protein:  80-105g  Fluid:  >1.5L/d    08/18/20, MS, RD, LDN Inpatient Clinical Dietitian Please see [facility]ON for contact information.

## 2020-08-17 NOTE — Progress Notes (Signed)
Modified Barium Swallow Progress Note  Patient Details  Name: Vincent Horton MRN: [id number] Date of Birth: [birth year]  Today's Date: 08/17/2020  Modified Barium Swallow completed.  Full report located under Chart Review in the Imaging Section.  Brief recommendations include the following:  Clinical Impression  Patient presents with a significant improvement in his swallow function as compared to previous MBS on 6/6. During today's MBS, he demonstrates a moderate oropharyngeal dysphagia. Patient exhibited delays in oral transit with all consistencies, but puree solids resulted in most significant delay. Thin liquids via cup sips and straw sips both led to swallow initiation delay to level of pyriform sinus, silent aspiration during as well as after the swallow from penetrate that had not cleared laryngeal vestibule. Aspiration events were sensed when liquid had already passed through the vocal cords and although he was able to cough and clear portion of aspirate, was not able to fully clear. Instances of silent aspiration did occur as well, which mainly were observed when aspiration was occuring after the swallow from penetrate. No penetration or aspiration was observed with nectar thick liquids via spoon sip or cup sip but delay in pharyngeal transit observed as well as vallecular and posterior pharyngeal wall residuals. Subsequent swallows and alternating liquids and puree solids did aid in reducing amount of pharyngeal residuals. SLP is recommending to initiate Dys 1 solids, nectar thick liquids diet at this time.   Swallow Evaluation Recommendations       SLP Diet Recommendations: Dysphagia 1 (Puree) solids;Nectar thick liquid   Liquid Administration via: Cup   Medication Administration: Crushed with puree   Supervision: Staff to assist with self feeding;Full supervision/cueing for compensatory strategies   Compensations: Slow rate;Small sips/bites;Minimize environmental distractions;Follow  solids with liquid   Postural Changes: Seated upright at 90 degrees   Oral Care Recommendations: Oral care BID;Staff/trained caregiver to provide oral care;Oral care before and after PO   Other Recommendations: Have oral suction available  Angela Nevin, MA, CCC-SLP Speech Therapy

## 2020-08-17 NOTE — Progress Notes (Signed)
PROGRESS NOTE   Subjective/Complaints: Sleepy this morning Participating well with therapy Patient's chart reviewed- No issues reported overnight Vitals signs stable   ROS: Limited due to language/cognition   Objective:   No results found. No results for input(s): WBC, HGB, HCT, PLT in the last 72 hours. Recent Labs    08/17/20 0505  CREATININE 0.71    Intake/Output Summary (Last 24 hours) at 08/17/2020 1030 Last data filed at 08/17/2020 0810 Gross per 24 hour  Intake 0 ml  Output 400 ml  Net -400 ml         Physical Exam: Vital Signs Blood pressure 91/63, pulse 80, temperature 98.6 F (37 C), temperature source Oral, resp. rate 18, height 5\' 4"  (1.626 m), weight 43.6 kg, SpO2 99 %. Gen: no distress, normal appearing HEENT: oral mucosa pink and moist, NCAT Cardio: Reg rate Chest: normal effort, normal rate of breathing Abd: soft, non-distended Ext: no edema Psych: pleasant affect Musculoskeletal:        General: No swelling. Normal range of motion.    Cervical back: Normal range of motion. Skin:    General: Skin is warm. Neurological:    Mental Status: He is alert.    Comments: Patient is alert.  With assistance of video interpreter he does follow simple commands.  Provides year in person but decreased awareness and insight as a whole  Patient is dysphonic. Moves all 4 limbs. Senses pain.      Assessment/Plan: 1. Functional deficits which require 3+ hours per day of interdisciplinary therapy in a comprehensive inpatient rehab setting. Physiatrist is providing close team supervision and 24 hour management of active medical problems listed below. Physiatrist and rehab team continue to assess barriers to discharge/monitor patient progress toward functional and medical goals  Care Tool:  Bathing    Body parts bathed by patient: Right arm, Left arm, Chest, Abdomen, Front perineal area, Buttocks, Right  upper leg, Left upper leg, Face, Left lower leg, Right lower leg         Bathing assist Assist Level: Minimal Assistance - Patient > 75%     Upper Body Dressing/Undressing Upper body dressing   What is the patient wearing?: Pull over shirt    Upper body assist Assist Level: Set up assist    Lower Body Dressing/Undressing Lower body dressing      What is the patient wearing?: Incontinence brief, Pants     Lower body assist Assist for lower body dressing: Minimal Assistance - Patient > 75%     Toileting Toileting    Toileting assist Assist for toileting: Moderate Assistance - Patient 50 - 74%     Transfers Chair/bed transfer  Transfers assist     Chair/bed transfer assist level: Minimal Assistance - Patient > 75%     Locomotion Ambulation   Ambulation assist      Assist level: Minimal Assistance - Patient > 75% Assistive device: No Device Max distance: 83   Walk 10 feet activity   Assist     Assist level: Minimal Assistance - Patient > 75% Assistive device: No Device   Walk 50 feet activity   Assist    Assist level: Minimal Assistance -  Patient > 75% Assistive device: No Device    Walk 150 feet activity   Assist Walk 150 feet activity did not occur: Safety/medical concerns  Assist level: Minimal Assistance - Patient > 75% Assistive device: No Device    Walk 10 feet on uneven surface  activity   Assist     Assist level: Moderate Assistance - Patient - 50 - 74% Assistive device: Other (comment), Hand held assist (railing)   Wheelchair     Assist Will patient use wheelchair at discharge?: No             Wheelchair 50 feet with 2 turns activity    Assist            Wheelchair 150 feet activity     Assist          Blood pressure 91/63, pulse 80, temperature 98.6 F (37 C), temperature source Oral, resp. rate 18, height 5\' 4"  (1.626 m), weight 43.6 kg, SpO2 99 %.  Medical Problem List and Plan: 1.   Debility with altered mental status secondary to CAD/anterior STEMI /cardiogenic shock with occluded proximal LAD status post stenting post subacute left frontal infarct             -patient may shower             -ELOS/Goals: 16-20 days, Mod I with PT/OT, supervision SLP  Continue CIR_ PT, OT and SLP 2.  Impaired mobility: continue Lovenox             -antiplatelet therapy: Aspirin 81 mg daily, Brilinta 90 mg twice daily 3. Pain Management: Denies pain. Change Oxycodone to as needed 4. Anxiety: Continue Klonopin 0.5 mg 3 times daily             -antipsychotic agents: N/A 5. Neuropsych: This patient is not capable of making decisions on his own behalf.             -is fall risk, safety plan needed 6. Skin/Wound Care: Routine skin checks 7. Fluids/Electrolytes/Nutrition: Routine in and outs with follow-up chemistries on admit. 8.  Acute hypoxemic respiratory failure.  Patient extubated.  Suspect HCAP completing 7-day course of antibiotic therapy 9.  Right pleural effusion.  Status postthoracentesis 08/01/2020 with 450 cc fluid removal.  Follow-up chest x-ray 08/02/2020 without effusion 10.  Dysphagia.  NPO. NGT feedings currently -f/u MBS, plan per SLP  6/12- Stil NPO- per SLP- on Tfs continuous. - will discuss plan w/ team 11.  Hypertension.  Coreg 3.125 mg twice daily, Lanoxin 0.125 mg daily Vitals:   08/16/20 1946 08/17/20 0306  BP: (!) 89/63 91/63  Pulse: 84 80  Resp: 19 18  Temp:  98.6 F (37 C)  SpO2: 95% 99%   BP soft: d/c Coreg.  BMET without sig changes but negative output add free H20  12.  Hyperlipidemia.  Crestor 13.  New findings diabetes mellitus.  Hemoglobin A1c 6.5.  SSI.  NovoLog 3 units every 4 hours.  Diabetic teaching CBG (last 3)  Recent Labs    08/17/20 0015 08/17/20 0432 08/17/20 0750  GLUCAP 120* 152* 133*  Control is good 6/17  14.  Tobacco abuse.  NicoDerm patch.  Counseling  15. Vascular congestion/coughing  Improved with albuterol       LOS: 7  days A FACE TO FACE EVALUATION WAS PERFORMED  Vincent Horton Vincent Horton 08/17/2020, 10:30 AM

## 2020-08-17 NOTE — Progress Notes (Signed)
Physical Therapy Session Note  Patient Details  Name: Vincent Horton MRN: [id number] Date of Birth: 17-Feb-1956  Today's Date: 08/17/2020 PT Individual Time: 1000-1025; 0272-5366 PT Individual Time Calculation (min): 25 min and 27 mins  Short Term Goals: Week 1:  PT Short Term Goal 1 (Week 1): Pt will perform sit<>stands using LRAD with CGA PT Short Term Goal 2 (Week 1): Pt will perform bed<>chair transfers using LRAD with CGA PT Short Term Goal 3 (Week 1): Pt will ambulate at least 151ft using LRAD with CGA PT Short Term Goal 4 (Week 1): Pt will ascend/descend 4 steps using HRs per home set-up with CGA  Skilled Therapeutic Interventions/Progress Updates:    Session 1: Patient received supine in bed, agreeable to PT. He denies pain. PT unable to connect with interpretor on [employer]. Patient coming to sit edge of bed with supervision and transfer to wc via stand pivot with CGA. Patient donning sneakers with supervision. Patient then requesting that PT collect various items of clothing and phone charger from around the room and put into bag. Patient then stating "okay I'm ready to go home." PT acknowledging that while it is a Friday that he'll go home, it's not this Friday. Patient mildly frustrated at this insisting that he can go home. Then patient requesting to return to the hospital. PT reorienting patient to time, place, and situation. PT transporting patient in wc to therapy gym for time management and energy conservation. He completed 8 mins on UE ergometer with no back support for improved truncal rotation. Patient returning to room in wc, handoff the NT for continued care.  Session 2: Patient received reclined in bed, eating lunch, dtr and SIL at bedside, agreeable to brief family ed. Family with questions regarding level of assist at home. PT explaining current deficits and level of assist as well as plan to have formal family ed closer to dc to allow family to have hands-on training. SIL confirms  that he will be able to provide 24/7 supervision/assist for at least 3 weeks upon dc, but family would like to hire care provider for additional time once SIL has to return to [facility]. PT also reiterating that though son reports not wanting patient to use RW upon dc, it may be safest to use AD at dc. Dtr and SIL voiced understanding. Patient remaining in bed, bed alarm on, call light within reach, family at bedside.   Therapy Documentation Precautions:  Precautions Precautions: Fall, Other (comment) Precaution Comments: cortrak, slightly impulsive Restrictions Weight Bearing Restrictions: No    Therapy/Group: Individual Therapy  Elizebeth Koller, PT, DPT, CBIS  08/17/2020, 7:41 AM

## 2020-08-17 NOTE — Progress Notes (Signed)
Occupational Therapy Weekly Progress Note  Patient Details  Name: Vincent Horton MRN: [id number] Date of Birth: [birth year]  Beginning of progress report period: August 11, 2020 End of progress report period: August 17, 2020  Today's Date: 08/17/2020 OT Individual Time: 5248-1859 OT Individual Time Calculation (min): 18 min    Patient has met 2 of 4 short term goals.  Pt is making steady progress with OT, however he does exhibit decreased motivation to participate at times.  When participating he is able to complete UB selfcare with supervision and LB selfcare at min guard assist.  He is able to complete toilet transfers simulated at min assist level and no device with min guard to complete grooming tasks standing at the sink.  He continues to demonstrate decreased endurance with decreased overall dynamic standing balance during mobility in the room with LOB to the right frequently.  He continues to demonstrate decreased safety awareness as well.  Have discussed issues with his son and that he will need 24 hr supervision.  He voices understanding and they are working on arranging this.  Recommend continued CIR therapy in order to meet established goals of supervision and continue working on endurance.    Patient continues to demonstrate the following deficits: decreased cardiorespiratoy endurance, impaired timing and sequencing, unbalanced muscle activation, and decreased coordination, decreased initiation, decreased awareness, decreased problem solving, decreased safety awareness, and decreased memory, and decreased standing balance, decreased postural control, and decreased balance strategies and therefore will continue to benefit from skilled OT intervention to enhance overall performance with BADL and Reduce care partner burden.  Patient progressing toward long term goals..  Continue plan of care.  OT Short Term Goals Week 2:  OT Short Term Goal 1 (Week 2): Continue working on established goals at  supervision level.  Skilled Therapeutic Interventions/Progress Updates:    Pt in bed to start session, discussed via interpreter about his progress from swallowing test earlier today.  Pt declining participation in selfcare tasks and overall participation in therapy.  Tried to encourage pt that he will not get very much therapy over the weekend and needs to do as much as possible with discharge planned next week.  Did get him to stand from the bed and ambulate over to the sink in order to wipe the barium from his mouth.  Min assist to complete functional mobility over and then back to the bed.  He declined further activity and transitioned to supine.  Call button and phone in reach with bed alarm in place.    Therapy Documentation Precautions:  Precautions Precautions: Fall, Other (comment) Precaution Comments: cortrak, slightly impulsive Restrictions Weight Bearing Restrictions: No General: General OT Amount of Missed Time: 40 Minutes   Pain: Pain Assessment Pain Scale: Faces Pain Score: 0-No pain ADL: See Care Tool Section for some details of mobility and selfcare  Therapy/Group: Individual Therapy  Mende Biswell OTR/L 08/17/2020, 12:16 PM

## 2020-08-18 LAB — GLUCOSE, CAPILLARY
Glucose-Capillary: 141 mg/dL — ABNORMAL HIGH (ref 70–99)
Glucose-Capillary: 157 mg/dL — ABNORMAL HIGH (ref 70–99)
Glucose-Capillary: 167 mg/dL — ABNORMAL HIGH (ref 70–99)
Glucose-Capillary: 169 mg/dL — ABNORMAL HIGH (ref 70–99)
Glucose-Capillary: 176 mg/dL — ABNORMAL HIGH (ref 70–99)

## 2020-08-18 MED ORDER — INSULIN ASPART 100 UNIT/ML IJ SOLN
0.0000 [IU] | Freq: Three times a day (TID) | INTRAMUSCULAR | Status: DC
  Administered 2020-08-18 – 2020-08-20 (×5): 3 [IU] via SUBCUTANEOUS
  Administered 2020-08-21 (×2): 2 [IU] via SUBCUTANEOUS
  Administered 2020-08-22: 5 [IU] via SUBCUTANEOUS
  Administered 2020-08-22 (×2): 2 [IU] via SUBCUTANEOUS
  Administered 2020-08-22: 5 [IU] via SUBCUTANEOUS
  Administered 2020-08-23 (×2): 2 [IU] via SUBCUTANEOUS

## 2020-08-18 NOTE — Plan of Care (Signed)
  Problem: Sit to Stand Goal: LTG:  Patient will perform sit to stand with assistance level (PT) Description: LTG:  Patient will perform sit to stand with assistance level (PT) Flowsheets (Taken 08/18/2020 0959) LTG: PT will perform sit to stand in preparation for functional mobility with assistance level: (downgraded based on pt progress) Contact Guard/Touching assist   Problem: RH Bed to Chair Transfers Goal: LTG Patient will perform bed/chair transfers w/assist (PT) Description: LTG: Patient will perform bed to chair transfers with assistance (PT). Flowsheets (Taken 08/18/2020 0959) LTG: Pt will perform Bed to Chair Transfers with assistance level: (downgraded based on pt progress) Contact Guard/Touching assist   Problem: RH Car Transfers Goal: LTG Patient will perform car transfers with assist (PT) Description: LTG: Patient will perform car transfers with assistance (PT). Flowsheets (Taken 08/18/2020 0959) LTG: Pt will perform car transfers with assist:: (downgraded based on pt progress) Contact Guard/Touching assist   Problem: RH Ambulation Goal: LTG Patient will ambulate in controlled environment (PT) Description: LTG: Patient will ambulate in a controlled environment, # of feet with assistance (PT). Flowsheets (Taken 08/18/2020 0959) LTG: Pt will ambulate in controlled environ  assist needed:: (downgraded based on pt progress) Contact Guard/Touching assist LTG: Ambulation distance in controlled environment: 113ft using LRAD Goal: LTG Patient will ambulate in home environment (PT) Description: LTG: Patient will ambulate in home environment, # of feet with assistance (PT). Flowsheets (Taken 08/18/2020 0959) LTG: Pt will ambulate in home environ  assist needed:: (downgraded based on pt progress) Contact Guard/Touching assist LTG: Ambulation distance in home environment: 9ft using LRAD   Problem: RH Stairs Goal: LTG Patient will ambulate up and down stairs w/assist (PT) Description:  LTG: Patient will ambulate up and down # of stairs with assistance (PT) Flowsheets (Taken 08/18/2020 0959) LTG: Pt will ambulate up/down stairs assist needed:: (downgraded based on pt progress) Minimal Assistance - Patient > 75% LTG: Pt will  ambulate up and down number of stairs: 4 steps using HRs per home set-up (no HRs currently in place)

## 2020-08-18 NOTE — Progress Notes (Signed)
Physical Therapy Weekly Progress Note  Patient Details  Name: Vincent Horton MRN: [id number] Date of Birth: 12-Aug-1955  Beginning of progress report period: August 11, 2020 End of progress report period: August 18, 2020  Today's Date: 08/18/2020 PT Individual Time: 8343-7357 PT Individual Time Calculation (min): 10 min   and  Today's Date: 08/18/2020 PT Missed Time: 50 Minutes Missed Time Reason: Patient fatigue;Patient unwilling to participate  Patient has met 0 of 4 short term goals.  Vincent Horton has had slower than anticipated progress due to inconsistent participation in therapy sessions. He is performing supine<>sit with supervision, sit<>stands and stand pivot transfers with varying min assist to CGA, ambulating up to 171f no AD with min assist, and ascending/descending 4 stairs using HRs with CGA. Pt demos impaired endurance, impaired balance strategies, and poor safety awareness. He will benefit from continued CIR level therapies and recommend 24hr support at home.  Patient continues to demonstrate the following deficits muscle weakness, decreased cardiorespiratoy endurance, impaired timing and sequencing, unbalanced muscle activation, decreased coordination, and decreased motor planning, decreased initiation, decreased awareness, decreased problem solving, decreased safety awareness, and delayed processing, and decreased sitting balance, decreased standing balance, decreased postural control, and decreased balance strategies and therefore will continue to benefit from skilled PT intervention to increase functional independence with mobility.  Patient not progressing toward long term goals.  See goal revision. LTGs downgraded due to inconsistent participation and slower than anticipated progress. Continue plan of care.  PT Short Term Goals Week 1:  PT Short Term Goal 1 (Week 1):  (inconsistent) PT Short Term Goal 1 - Progress (Week 1): Progressing toward goal PT Short Term Goal 2 (Week 1): Pt  will perform bed<>chair transfers using LRAD with CGA PT Short Term Goal 2 - Progress (Week 1): Progressing toward goal (inconsistent) PT Short Term Goal 3 (Week 1): Pt will ambulate at least 1014fusing LRAD with CGA PT Short Term Goal 3 - Progress (Week 1): Progressing toward goal PT Short Term Goal 4 (Week 1): Pt will ascend/descend 4 steps using HRs per home set-up with CGA PT Short Term Goal 4 - Progress (Week 1): Progressing toward goal Week 2:  PT Short Term Goal 1 (Week 2): = to LTGs based on ELOS  Skilled Therapeutic Interventions/Progress Updates:  Ambulation/gait training;Community reintegration;DME/adaptive equipment instruction;Neuromuscular re-education;Psychosocial support;Stair training;Discharge planning;Functional electrical stimulation;Balance/vestibular training;UE/LE Strength taining/ROM;Pain management;Skin care/wound management;Therapeutic Activities;UE/LE Coordination activities;Cognitive remediation/compensation;Disease management/prevention;Functional mobility training;Patient/family education;Therapeutic Exercise;Visual/perceptual remediation/compensation;Splinting/orthotics   Pt received supine in bed, awake. Upon therapist arrival pt stating he wants the cortrak removed now that he has been advanced to a diet - therapist educated on plan to leave cortrak in place until ensuring he is consuming enough calories on current diet. Pt verbalizes understanding. Pt then says he is "ready to leave" therapist educated him on current ELOS with D/C date on 6/24. Therapist also educated pt on importance of participating in therapies to improve his standing balance and gait prior to D/C. Pt states he is "very tired" then mentions he is "scared" when walking, afraid he is going to fall forward - therapist educated pt on the fact that he does has have an anterior weight shift bias during gait and that participating in balance, strengthening, and gait tasks will  improve this and decrease his  risk of falls. Therapist offered use of RW or other modifications to interventions to decrease pt's fear of falling. Despite this education along with reinforcement of importance of not missing further therapy pt continues to decline  to participate stating he is "tired,tired, tired." Pt left supine in bed with needs in reach, bed alarm on, and telesitter in place. Missed 50 minutes of skilled physical therapy.  Discussed pt's fatigue with RN and possible need to track whether pt is getting sufficient sleep at night.  Therapy Documentation Precautions:  Precautions Precautions: Fall, Other (comment) Precaution Comments: cortrak, slightly impulsive Restrictions Weight Bearing Restrictions: No   Pain:  No reports of pain.  Therapy/Group: Individual Therapy  Tawana Scale , PT, DPT, CSRS 08/18/2020, 7:52 AM

## 2020-08-19 LAB — GLUCOSE, CAPILLARY
Glucose-Capillary: 159 mg/dL — ABNORMAL HIGH (ref 70–99)
Glucose-Capillary: 115 mg/dL — ABNORMAL HIGH (ref 70–99)
Glucose-Capillary: 105 mg/dL — ABNORMAL HIGH (ref 70–99)
Glucose-Capillary: 99 mg/dL (ref 70–99)

## 2020-08-19 NOTE — Progress Notes (Signed)
Pharmacy notified re: lovenox ; when scanned it shows patient has an allergy to med. Per pharmacy need to clarify with family. Son called and he said they are listed as allergies because of religious belief. Son  was asking pharmacy for explanation. Pharmacy called.

## 2020-08-19 NOTE — Progress Notes (Signed)
Occupational Therapy Session Note  Patient Details  Name: Vincent Horton MRN: [id number] Date of Birth: 1955/11/27  Today's Date: 08/19/2020 OT Individual Time: 8299-3716 OT Individual Time Calculation (min): 62 min    Short Term Goals: Week 2:  OT Short Term Goal 1 (Week 2): Continue working on established goals at supervision level.  Skilled Therapeutic Interventions/Progress Updates:    Pt in bed to start session but agreeable to therapy.  He was able to complete shower and dressing to start with min assist for functional mobility to the shower without an assistive device.  He then complete bathing in sitting with close supervision but needed min demonstrational cueing for using soap and washcloth as initially he just let the water run over his body.  Once dried off, he was able to work on dressing from 3:1 in the bathroom as well as the EOB.  He completed UB dressing with setup as well as LB dressing with min assist secondary to LOB posteriorly.  He needed min demonstrational cueing to sit down to try and put on his pants instead of trying it in standing.  He transitioned out to the EOB for completion of donning new gripper socks and his shoes with setup assist.  Next, had him donn his zip up jacket in sitting to stand with min guard before ambulating down to the therapy gym at min assist with no device.  Worked on standing balance and balance reactions with use of the foam and rebounder.  Had him stand on the foam while tossing a soft soccer ball and then a kickball to the rebounder.  Min guard to maintain balance with no significant LOB noted, except when attempting to step backwards off of the foam.  Mod assist needed to regain balance.  Continued to use foam for standing balance while having him close his eyes.  He could maintain static standing for 10-15 seconds before posterior LOB.  Worked on stepping forward and backwards off of the foam pad as well with min assist for balance.  Finished session  with completion of BITS Visual Scanning Task in sitting where he completed 2 mins with an average of 2.4 seconds and 74% accuracy with use of the right and left hands.  Returned to the room at min assist level with transfer to the bed to complete session.  Call button and phone in reach with safety alarm in place.    Therapy Documentation Precautions:  Precautions Precautions: Fall, Other (comment) Precaution Comments: cortrak, slightly impulsive Restrictions Weight Bearing Restrictions: No  Pain: Pain Assessment Pain Scale: Faces Pain Score: 0-No pain ADL: See Care Tool Section for some details of mobility and selfcare  Therapy/Group: Individual Therapy  Anas Reister OTR/L 08/19/2020, 3:39 PM

## 2020-08-19 NOTE — Progress Notes (Signed)
Cortrak removed per order; pt. tolerated

## 2020-08-19 NOTE — Progress Notes (Signed)
PROGRESS NOTE   Subjective/Complaints:  No issues overnite , no pains, taking meds in applesauce with RN, ate 100% last 2 meals and drank with breakfast  ROS: Limited due to language/cognition   Objective:   DG Swallowing Func-Speech Pathology  Result Date: 08/17/2020 Formatting of this result is different from the original. Objective Swallowing Evaluation: Type of Study: MBS-Modified Barium Swallow Study  Patient Details Name: Zaydenn Balaguer MRN: [id number] Date of Birth: 1955-08-03 Today's Date: 08/17/2020 Time: SLP Start Time (ACUTE ONLY): 1455 -SLP Stop Time (ACUTE ONLY): 1525 SLP Time Calculation (min) (ACUTE ONLY): 30 min Past Medical History: Past Medical History: Diagnosis Date  Cardiac arrest (HCC)   Coronary artery disease   Dysrhythmia   Myocardial infarction [facility])  Past Surgical History: Past Surgical History: Procedure Laterality Date  CARDIAC CATHETERIZATION    CORONARY/GRAFT ACUTE MI REVASCULARIZATION N/A 07/21/2020  Procedure: Coronary/Graft Acute MI Revascularization;  Surgeon: Swaziland, Peter M, MD;  Location: [facility] INVASIVE CV LAB;  Service: Cardiovascular;  Laterality: N/A;  LEFT HEART CATH AND CORONARY ANGIOGRAPHY N/A 07/21/2020  Procedure: LEFT HEART CATH AND CORONARY ANGIOGRAPHY;  Surgeon: Swaziland, Peter M, MD;  Location: [facility] INVASIVE CV LAB;  Service: Cardiovascular;  Laterality: N/A;  RIGHT HEART CATH N/A 07/21/2020  Procedure: RIGHT HEART CATH;  Surgeon: Swaziland, Peter M, MD;  Location: [facility] INVASIVE CV LAB;  Service: Cardiovascular;  Laterality: N/A; HPI: Pt is a 65 yo male found down by family VF arrest associated with STEMI, shocked 5x, 15 min CPR before ROSC achieved. ETT 5/21-5/31. CXR 5/31 with persistent bibasilar infiltrates/edema. PMH includes cardiac arrest, CAD, dysrhythmia, myocardial infarction. Per pt's son, pt's oral intake at home was limited to very soft and/or liquidised foods PTA due to condition of dentition.   Subjective: pt alert and cooperative, needing repetition of instructions Assessment / Plan / Recommendation CHL IP CLINICAL IMPRESSIONS 08/17/2020 Clinical Impression Patient presents with a significant improvement in his swallow function as compared to previous MBS on 6/6. During today's MBS, he demonstrates a moderate oropharyngeal dysphagia. Patient exhibited delays in oral transit with all consistencies, but puree solids resulted in most significant delay. Thin liquids via cup sips and straw sips both led to swallow initiation delay to level of pyriform sinus, silent aspiration during as well as after the swallow from penetrate that had not cleared laryngeal vestibule. Aspiration events were sensed when liquid had already passed through the vocal cords and although he was able to cough and clear portion of aspirate, was not able to fully clear. Instances of silent aspiration did occur as well, which mainly were observed when aspiration was occuring after the swallow from penetrate. No penetration or aspiration was observed with nectar thick liquids via spoon sip or cup sip but delay in pharyngeal transit observed as well as vallecular and posterior pharyngeal wall residuals. Subsequent swallows and alternating liquids and puree solids did aid in reducing amount of pharyngeal residuals. SLP is recommending to initiate Dys 1 solids, nectar thick liquids diet at this time. SLP Visit Diagnosis Dysphagia, oropharyngeal phase (R13.12) Attention and concentration deficit following -- Frontal lobe and executive function deficit following -- Impact on safety and function Mild aspiration risk;Moderate aspiration  risk   CHL IP TREATMENT RECOMMENDATION 08/06/2020 Treatment Recommendations Therapy as outlined in treatment plan below   Prognosis 08/06/2020 Prognosis for Safe Diet Advancement Good Barriers to Reach Goals Cognitive deficits;Severity of deficits Barriers/Prognosis Comment -- CHL IP DIET RECOMMENDATION 08/17/2020 SLP  Diet Recommendations Dysphagia 1 (Puree) solids;Nectar thick liquid Liquid Administration via Cup Medication Administration Crushed with puree Compensations Slow rate;Small sips/bites;Minimize environmental distractions;Follow solids with liquid Postural Changes Seated upright at 90 degrees   CHL IP OTHER RECOMMENDATIONS 08/17/2020 Recommended Consults -- Oral Care Recommendations Oral care BID;Staff/trained caregiver to provide oral care;Oral care before and after PO Other Recommendations Have oral suction available   CHL IP FOLLOW UP RECOMMENDATIONS 08/08/2020 Follow up Recommendations Inpatient Rehab   CHL IP FREQUENCY AND DURATION 08/08/2020 Speech Therapy Frequency (ACUTE ONLY) min 2x/week Treatment Duration --      CHL IP ORAL PHASE 08/17/2020 Oral Phase Impaired Oral - Pudding Teaspoon -- Oral - Pudding Cup -- Oral - Honey Teaspoon -- Oral - Honey Cup -- Oral - Nectar Teaspoon NT Oral - Nectar Cup Reduced posterior propulsion;Delayed oral transit;Weak lingual manipulation Oral - Nectar Straw -- Oral - Thin Teaspoon Weak lingual manipulation;Reduced posterior propulsion;Delayed oral transit Oral - Thin Cup Weak lingual manipulation;Reduced posterior propulsion;Delayed oral transit Oral - Thin Straw Weak lingual manipulation;Reduced posterior propulsion;Delayed oral transit Oral - Puree Delayed oral transit;Reduced posterior propulsion;Weak lingual manipulation Oral - Mech Soft -- Oral - Regular -- Oral - Multi-Consistency -- Oral - Pill -- Oral Phase - Comment --  CHL IP PHARYNGEAL PHASE 08/17/2020 Pharyngeal Phase Impaired Pharyngeal- Pudding Teaspoon -- Pharyngeal -- Pharyngeal- Pudding Cup -- Pharyngeal -- Pharyngeal- Honey Teaspoon -- Pharyngeal -- Pharyngeal- Honey Cup -- Pharyngeal -- Pharyngeal- Nectar Teaspoon Pharyngeal residue - valleculae;Pharyngeal residue - posterior pharnyx;Delayed swallow initiation-vallecula;Reduced pharyngeal peristalsis Pharyngeal Material does not enter airway Pharyngeal- Nectar  Cup Delayed swallow initiation-vallecula Pharyngeal -- Pharyngeal- Nectar Straw -- Pharyngeal -- Pharyngeal- Thin Teaspoon Delayed swallow initiation-vallecula;Delayed swallow initiation-pyriform sinuses;Reduced airway/laryngeal closure;Penetration/Apiration after swallow;Pharyngeal residue - valleculae;Pharyngeal residue - posterior pharnyx Pharyngeal Material enters airway, remains ABOVE vocal cords and not ejected out Pharyngeal- Thin Cup Delayed swallow initiation-pyriform sinuses Pharyngeal -- Pharyngeal- Thin Straw Delayed swallow initiation-pyriform sinuses;Reduced airway/laryngeal closure;Penetration/Apiration after swallow;Penetration/Aspiration during swallow;Pharyngeal residue - posterior pharnyx;Pharyngeal residue - valleculae Pharyngeal Material enters airway, passes BELOW cords and not ejected out despite cough attempt by patient;Material enters airway, passes BELOW cords without attempt by patient to eject out (silent aspiration);Material enters airway, remains ABOVE vocal cords and not ejected out Pharyngeal- Puree Reduced pharyngeal peristalsis;Pharyngeal residue - valleculae;Pharyngeal residue - posterior pharnyx;Pharyngeal residue - pyriform Pharyngeal Material does not enter airway Pharyngeal- Mechanical Soft -- Pharyngeal -- Pharyngeal- Regular -- Pharyngeal -- Pharyngeal- Multi-consistency -- Pharyngeal -- Pharyngeal- Pill -- Pharyngeal -- Pharyngeal Comment --  CHL IP CERVICAL ESOPHAGEAL PHASE 08/17/2020 Cervical Esophageal Phase WFL Pudding Teaspoon -- Pudding Cup -- Honey Teaspoon -- Honey Cup -- Nectar Teaspoon -- Nectar Cup -- Nectar Straw -- Thin Teaspoon -- Thin Cup -- Thin Straw -- Puree -- Mechanical Soft -- Regular -- Multi-consistency -- Pill -- Cervical Esophageal Comment -- Angela Nevin, MA, CCC-SLP Speech Therapy             No results for input(s): WBC, HGB, HCT, PLT in the last 72 hours. Recent Labs    08/17/20 0505  CREATININE 0.71     Intake/Output Summary (Last 24  hours) at 08/19/2020 0921 Last data filed at 08/19/2020 0748 Gross per 24 hour  Intake 910 ml  Output --  Net 910 ml          Physical Exam: Vital Signs Blood pressure (!) 90/58, pulse 93, temperature (!) 97.4 F (36.3 C), temperature source Oral, resp. rate 19, height 5\' 4"  (1.626 m), weight 43.6 kg, SpO2 96 %.  General: No acute distress Mood and affect are appropriate Heart: Regular rate and rhythm no rubs murmurs or extra sounds Lungs: Clear to auscultation, breathing unlabored, no rales or wheezes Abdomen: Positive bowel sounds, soft nontender to palpation, nondistended Extremities: No clubbing, cyanosis, or edema Skin: No evidence of breakdown, no evidence of rash er and lower extremities Musculoskeletal: Full range of motion in all 4 extremities. No joint swelling  Musculoskeletal:        General: No swelling. Normal range of motion.    Cervical back: Normal range of motion. Skin:    General: Skin is warm. Neurological:    Mental Status: He is alert.    Comments: Patient is alert.  With assistance of video interpreter he does follow simple commands.  Provides year in person but decreased awareness and insight as a whole  Patient is dysphonic. Moves all 4 limbs. Senses pain.      Assessment/Plan: 1. Functional deficits which require 3+ hours per day of interdisciplinary therapy in a comprehensive inpatient rehab setting. Physiatrist is providing close team supervision and 24 hour management of active medical problems listed below. Physiatrist and rehab team continue to assess barriers to discharge/monitor patient progress toward functional and medical goals  Care Tool:  Bathing    Body parts bathed by patient: Right arm, Left arm, Chest, Abdomen, Front perineal area, Buttocks, Right upper leg, Left upper leg, Face, Left lower leg, Right lower leg         Bathing assist Assist Level: Minimal Assistance - Patient > 75%     Upper Body Dressing/Undressing Upper  body dressing   What is the patient wearing?: Pull over shirt    Upper body assist Assist Level: Set up assist    Lower Body Dressing/Undressing Lower body dressing      What is the patient wearing?: Incontinence brief, Pants     Lower body assist Assist for lower body dressing: Minimal Assistance - Patient > 75%     Toileting Toileting    Toileting assist Assist for toileting: Moderate Assistance - Patient 50 - 74%     Transfers Chair/bed transfer  Transfers assist     Chair/bed transfer assist level: Contact Guard/Touching assist     Locomotion Ambulation   Ambulation assist      Assist level: Minimal Assistance - Patient > 75% Assistive device: No Device Max distance: 83   Walk 10 feet activity   Assist     Assist level: Minimal Assistance - Patient > 75% Assistive device: No Device   Walk 50 feet activity   Assist    Assist level: Minimal Assistance - Patient > 75% Assistive device: No Device    Walk 150 feet activity   Assist Walk 150 feet activity did not occur: Safety/medical concerns  Assist level: Minimal Assistance - Patient > 75% Assistive device: No Device    Walk 10 feet on uneven surface  activity   Assist     Assist level: Moderate Assistance - Patient - 50 - 74% Assistive device: Other (comment), Hand held assist (railing)   Wheelchair     Assist Will patient use wheelchair at discharge?: No  Wheelchair 50 feet with 2 turns activity    Assist            Wheelchair 150 feet activity     Assist          Blood pressure (!) 90/58, pulse 93, temperature (!) 97.4 F (36.3 C), temperature source Oral, resp. rate 19, height 5\' 4"  (1.626 m), weight 43.6 kg, SpO2 96 %.  Medical Problem List and Plan: 1.  Debility with altered mental status secondary to CAD/anterior STEMI /cardiogenic shock with occluded proximal LAD status post stenting post subacute left frontal infarct              -patient may shower             -ELOS/Goals: 16-20 days, Mod I with PT/OT, supervision SLP  Continue CIR_ PT, OT and SLP 2.  Impaired mobility: continue Lovenox             -antiplatelet therapy: Aspirin 81 mg daily, Brilinta 90 mg twice daily 3. Pain Management: Denies pain. Change Oxycodone to as needed 4. Anxiety: Continue Klonopin 0.5 mg 3 times daily             -antipsychotic agents: N/A 5. Neuropsych: This patient is not capable of making decisions on his own behalf.             -is fall risk, safety plan needed 6. Skin/Wound Care: Routine skin checks 7. Fluids/Electrolytes/Nutrition: Routine in and outs with follow-up chemistries on admit. 8.  Acute hypoxemic respiratory failure.  Patient extubated.  Suspect HCAP completing 7-day course of antibiotic therapy 9.  Right pleural effusion.  Status postthoracentesis 08/01/2020 with 450 cc fluid removal.  Follow-up chest x-ray 08/02/2020 without effusion 10.  Dysphagia.  NPO. NGT feedings currently -f/u MBS, plan per SLP  Now on D1 Nectar diet- eating 100% , drinking well, takes meds in applesauce will d/c cortrak 11.  Hypertension.  Coreg 3.125 mg twice daily Vitals:   08/18/20 1954 08/19/20 0351  BP: 95/61 (!) 90/58  Pulse: 90 93  Resp: 18 19  Temp: 98 F (36.7 C) (!) 97.4 F (36.3 C)  SpO2: 97% 96%   BP soft: d/c Coreg. asymptomatic  12.  Hyperlipidemia.  Crestor 13.  New findings diabetes mellitus.  Hemoglobin A1c 6.5.  SSI.  .  Diabetic teaching CBG (last 3)  Recent Labs    08/18/20 1605 08/18/20 2112 08/19/20 0606  GLUCAP 167* 157* 159*   Control is good 6/20  14.  Tobacco abuse.  NicoDerm patch.  Counseling  15. Vascular congestion/coughing  Improved with albuterol   16.  Cardiomyopathy ej fx 35-40%- cont lanoxin, off coreg , spironolactone due to low BP- no signs of failure  F/u cardiology as OP   LOS: 9 days A FACE TO FACE EVALUATION WAS PERFORMED  Erick Colace 08/19/2020, 9:21 AM

## 2020-08-20 DIAGNOSIS — I63519 Cerebral infarction due to unspecified occlusion or stenosis of unspecified middle cerebral artery: Secondary | ICD-10-CM

## 2020-08-20 LAB — CBC WITH DIFFERENTIAL/PLATELET
Abs Immature Granulocytes: 0.02 10*3/uL (ref 0.00–0.07)
Basophils Absolute: 0.1 10*3/uL (ref 0.0–0.1)
Basophils Relative: 1 %
Eosinophils Absolute: 0.3 10*3/uL (ref 0.0–0.5)
Eosinophils Relative: 4 %
HCT: 31.6 % — ABNORMAL LOW (ref 39.0–52.0)
Hemoglobin: 10.1 g/dL — ABNORMAL LOW (ref 13.0–17.0)
Immature Granulocytes: 0 %
Lymphocytes Relative: 23 %
Lymphs Abs: 2.1 10*3/uL (ref 0.7–4.0)
MCH: 30.2 pg (ref 26.0–34.0)
MCHC: 32 g/dL (ref 30.0–36.0)
MCV: 94.6 fL (ref 80.0–100.0)
Monocytes Absolute: 0.6 10*3/uL (ref 0.1–1.0)
Monocytes Relative: 7 %
Neutro Abs: 5.9 10*3/uL (ref 1.7–7.7)
Neutrophils Relative %: 65 %
Platelets: 263 10*3/uL (ref 150–400)
RBC: 3.34 MIL/uL — ABNORMAL LOW (ref 4.22–5.81)
RDW: 15.9 % — ABNORMAL HIGH (ref 11.5–15.5)
WBC: 9 10*3/uL (ref 4.0–10.5)
nRBC: 0 % (ref 0.0–0.2)

## 2020-08-20 LAB — GLUCOSE, CAPILLARY
Glucose-Capillary: 101 mg/dL — ABNORMAL HIGH (ref 70–99)
Glucose-Capillary: 99 mg/dL (ref 70–99)
Glucose-Capillary: 93 mg/dL (ref 70–99)
Glucose-Capillary: 157 mg/dL — ABNORMAL HIGH (ref 70–99)

## 2020-08-20 MED ORDER — ENSURE ENLIVE PO LIQD
237.0000 mL | Freq: Two times a day (BID) | ORAL | Status: DC
  Administered 2020-08-20 – 2020-08-23 (×6): 237 mL via ORAL

## 2020-08-20 NOTE — Progress Notes (Signed)
Physical Therapy Session Note  Patient Details  Name: Vincent Horton MRN: [id number] Date of Birth: [birth year]  Today's Date: 08/20/2020 PT Individual Time: 1127-1159 PT Individual Time Calculation (min): 32 min  and Today's Date: 08/20/2020 PT Missed Time: 28 Minutes Missed Time Reason: Patient fatigue;Patient unwilling to participate  Short Term Goals: Week 2:  PT Short Term Goal 1 (Week 2): = to LTGs based on ELOS  Skilled Therapeutic Interventions/Progress Updates:    Patient received supine in bed, requesting to rest for 30 mins prior to PT session. PT unable to encourage patient to participate in therapy at this time. Upon PT returning, he was agreeable to PT session. He denies pain. He was able to come sit edge of bed with supervision and don sneakers with supervision. Patient transferring to wc via stand pivot with CGA. PT transporting patient in wc to therapy gym for time management for energy conservation. He completed balance re-training on Biodex with emphasis on weight shift control and minimizing excessive anterior weight shift as previously seen during gait training bouts. Patient then ambulating back to his room ~145ft with CGA and no LOB and appropriate weight shifting noted. Patient returning to bed per his request, bed alarm on, call light within reach.   Therapy Documentation Precautions:  Precautions Precautions: Fall, Other (comment) Precaution Comments: cortrak, slightly impulsive Restrictions Weight Bearing Restrictions: No   Therapy/Group: Individual Therapy  Elizebeth Koller, PT, DPT, CBIS  08/20/2020, 7:36 AM

## 2020-08-20 NOTE — Progress Notes (Signed)
Occupational Therapy Session Note  Patient Details  Name: Vincent Horton MRN: [id number] Date of Birth: February 02, 1956  Today's Date: 08/20/2020 OT Individual Time: 2426-8341 OT Individual Time Calculation (min): 55 min    Short Term Goals: Week 2:  OT Short Term Goal 1 (Week 2): Continue working on established goals at supervision level.  Skilled Therapeutic Interventions/Progress Updates:     Session 1: (9622-2979) Pt in bed to start session agreeable to therapy.  He was able to complete supine to sit with supervision and then donned his shoes with setup.  Min assist for ambulation down to the therapy gym where he completed 3 sets of 3 mins on the Nustep for overall strengthening with average number of steps above 50.  Two to 3 mins rest given between each set.  The first 2 sets were completed on level 3 resistance with the last at level 5.  Next, he requested to complete toileting tasks with min assist for transfer to the bathroom without assistive device.  Min guard for toilet hygiene and clothing management.  Noted bright red blood in the toilet as well with nursing notified.  He washed his hands at min guard and then ambulated to the therapy gym with min assist.  Had him complete Nine Hole Peg test for BUE FM coordination.  He was able to complete with the left hand in 39 seconds and the right in 40 seconds.  Finished session with return to the room at min assist level and transfer to supine at supervision.    Session 2: (1436-1530)  Pt in bed to start with transition to sitting with independence and then donning his shoes with setup as well.  He was able to complete functional mobility down to the tub room with min guard assist and no device.  Had him practice stepping over into the tub with use of his UEs on the wall but not the grab bars, as he does not have this at home.  He was able to complete stepping in and out with min guard using his UEs as well as min guard to min assist without UE support.   He was able to ambulate up to the therapy gym where he worked on standing balance using the foam surface with min assist.  LOB when stepping on and off of the foam sideways as well as when stepping off posteriorly. He also exhibited LOB when standing with his eyes closed after 15 seconds.  Had him ambulate around the gym to pick up cones off of the floor as well, which he was able to complete with min guard assist.  Also had him work on ping pong ball sequencing task in sitting while resting, re-creating a sequence based on a diagram given.  He was able to complete with increased time and initial demonstration of how to complete the task.  Finished session with return to the room and with pt transition to the bed.  Call button and phone in reach with safety alarm in place.    Therapy Documentation Precautions:  Precautions Precautions: Fall, Other (comment) Precaution Comments: cortrak, slightly impulsive Restrictions Weight Bearing Restrictions: No   Pain: Pain Assessment Pain Score: 0-No pain ADL: See Care Tool Section for some details of mobility and selfcare   Therapy/Group: Individual Therapy  Rustin Erhart OTR/L 08/20/2020, 12:16 PM

## 2020-08-20 NOTE — Progress Notes (Signed)
Vincent Horton voided and there was blood in his urine. Vincent Horton and the charge nurse sue was notified. A CBC was ordered

## 2020-08-20 NOTE — Progress Notes (Signed)
Speech Language Pathology Daily Session Note  Patient Details  Name: Vincent Horton MRN: [id number] Date of Birth: October 25, 1955  Today's Date: 08/20/2020 SLP Individual Time: 5462-7035 SLP Individual Time Calculation (min): 42 min  Short Term Goals: Week 2: SLP Short Term Goal 1 (Week 2): STG-=LTG  Skilled Therapeutic Interventions:Skilled ST services focused on swallow and cognitive skills. Video interpretor was used during the session. SLP provided education pertaining to recent swallow assessment, current silent then sensed aspiration on thin liquids due to delay in swallow initiation. Pt demonstrated no recall of events, why he was here in the hospital and continued poor awareness of deficits requesting to go home. SLP continued to provide education. Pt was able to recall/locate call bell demonstrating some basic problem solving and recall. Pt completed oral care via suction toothbrush with supervision A verbal cues. Pt consumed x5 small cup sips of thin liquids in which pt demonstrated x1 delayed cough. SLP will plan for repeat MBS prior to discharge to assess swallow function. Pt was able to complete EMST set at 11cm H20 15 repetitions, with a self rating score of "easy" on a scale of 1-10, however SLP will continue set level due to pt demonstrating decreased intensity after 12 repetitions. SLP attempted to teach pt supraglottic swallow exercise to aid in swallow initiation, however pt was unable to coordinate. Pt was left in room with call bell within reach and bed alarm set. SLP recommends to continue skilled services.     Pain Pain Assessment Pain Score: 0-No pain  Therapy/Group: Individual Therapy  Christophe Rising  [facility] 08/20/2020, 4:04 PM

## 2020-08-20 NOTE — Progress Notes (Signed)
Received a call from Dasha, reporting hematuria. Mr. Salinger voided in commode, urine was not measured. Vitals: Temp 97/8, BP 94/67, P 82. Medication list was reviewed, he is on Brilinta  and ASA, History STEMI. Blood work ordered, He will use urinal for the rest of the night and serial urine's will be obtained. Also spoke with Fannie Knee RN: charge Nurse regarding the above, she verbalizes understanding. We will continue to monitor.

## 2020-08-20 NOTE — Progress Notes (Signed)
PROGRESS NOTE   Subjective/Complaints: Pt reports no issues; Nursing also reports the same.  Pt eating his breakfast with thickened liquids- D1 diet.  Denies pain.   ROS: Limited due to language/cognition  Objective:   No results found. No results for input(s): WBC, HGB, HCT, PLT in the last 72 hours. No results for input(s): NA, K, CL, CO2, GLUCOSE, BUN, CREATININE, CALCIUM in the last 72 hours.  Intake/Output Summary (Last 24 hours) at 08/20/2020 0912 Last data filed at 08/20/2020 0700 Gross per 24 hour  Intake 1194 ml  Output --  Net 1194 ml         Physical Exam: Vital Signs Blood pressure 94/64, pulse 85, temperature 97.6 F (36.4 C), temperature source Oral, resp. rate 19, height 5\' 4"  (1.626 m), weight 50 kg, SpO2 97 %.   General: awake, alert, appropriate, sitting up in bed- eating some breakfast; appears comfortable. NAD HENT: conjugate gaze; oropharynx moist CV: regular rate; no JVD Pulmonary: CTA B/L; no W/R/R- good air movement GI: soft, NT, ND, (+)BS- very thin Psychiatric: appropriate Neurological: Ox3 Skin- no skin breakdown seen er and lower extremities Musculoskeletal: Full range of motion in all 4 extremities. No joint swelling  Musculoskeletal:        General: No swelling. Normal range of motion.    Cervical back: Normal range of motion. Skin:    General: Skin is warm. Neurological:    Mental Status: He is alert.    Comments: Patient is alert.  With assistance of video interpreter he does follow simple commands.  Provides year in person but decreased awareness and insight as a whole  Patient is dysphonic. Moves all 4 limbs. Senses pain.      Assessment/Plan: 1. Functional deficits which require 3+ hours per day of interdisciplinary therapy in a comprehensive inpatient rehab setting. Physiatrist is providing close team supervision and 24 hour management of active medical problems listed  below. Physiatrist and rehab team continue to assess barriers to discharge/monitor patient progress toward functional and medical goals  Care Tool:  Bathing    Body parts bathed by patient: Right arm, Left arm, Chest, Abdomen, Front perineal area, Buttocks, Right upper leg, Left upper leg, Face, Left lower leg, Right lower leg         Bathing assist Assist Level: Minimal Assistance - Patient > 75%     Upper Body Dressing/Undressing Upper body dressing   What is the patient wearing?: Pull over shirt    Upper body assist Assist Level: Set up assist    Lower Body Dressing/Undressing Lower body dressing      What is the patient wearing?: Incontinence brief, Pants     Lower body assist Assist for lower body dressing: Minimal Assistance - Patient > 75%     Toileting Toileting    Toileting assist Assist for toileting: Moderate Assistance - Patient 50 - 74%     Transfers Chair/bed transfer  Transfers assist     Chair/bed transfer assist level: Minimal Assistance - Patient > 75%     Locomotion Ambulation   Ambulation assist      Assist level: Minimal Assistance - Patient > 75% Assistive device: No Device Max  distance: 150'   Walk 10 feet activity   Assist     Assist level: Minimal Assistance - Patient > 75% Assistive device: No Device   Walk 50 feet activity   Assist    Assist level: Minimal Assistance - Patient > 75% Assistive device: No Device    Walk 150 feet activity   Assist Walk 150 feet activity did not occur: Safety/medical concerns  Assist level: Minimal Assistance - Patient > 75% Assistive device: No Device    Walk 10 feet on uneven surface  activity   Assist     Assist level: Moderate Assistance - Patient - 50 - 74% Assistive device: Other (comment), Hand held assist (railing)   Wheelchair     Assist Will patient use wheelchair at discharge?: No             Wheelchair 50 feet with 2 turns  activity    Assist            Wheelchair 150 feet activity     Assist          Blood pressure 94/64, pulse 85, temperature 97.6 F (36.4 C), temperature source Oral, resp. rate 19, height 5\' 4"  (1.626 m), weight 50 kg, SpO2 97 %.  Medical Problem List and Plan: 1.  Debility with altered mental status secondary to CAD/anterior STEMI /cardiogenic shock with occluded proximal LAD status post stenting post subacute left frontal infarct             -patient may shower             -ELOS/Goals: 16-20 days, Mod I with PT/OT, supervision SLP  Continue CIR- PT, OT and SLP 2.  Impaired mobility: continue Lovenox             -antiplatelet therapy: Aspirin 81 mg daily, Brilinta 90 mg twice daily 3. Pain Management: Denies pain. Change Oxycodone to as needed 4. Anxiety: Continue Klonopin 0.5 mg 3 times daily             -antipsychotic agents: N/A 5. Neuropsych: This patient is not capable of making decisions on his own behalf.             -is fall risk, safety plan needed 6. Skin/Wound Care: Routine skin checks 7. Fluids/Electrolytes/Nutrition: Routine in and outs with follow-up chemistries on admit. 8.  Acute hypoxemic respiratory failure.  Patient extubated.  Suspect HCAP completing 7-day course of antibiotic therapy 9.  Right pleural effusion.  Status postthoracentesis 08/01/2020 with 450 cc fluid removal.  Follow-up chest x-ray 08/02/2020 without effusion 10.  Dysphagia.  NPO. NGT feedings currently -f/u MBS, plan per SLP  Now on D1 Nectar diet- eating 100% , drinking well, takes meds in applesauce will d/c cortrak 6/20- eating well for breakfast- con't without Cortrak 11.  Hypertension.  Coreg 3.125 mg twice daily Vitals:   08/19/20 1930 08/20/20 0328  BP: (!) 86/60 94/64  Pulse: 89 85  Resp: 18 19  Temp: 98.3 F (36.8 C) 97.6 F (36.4 C)  SpO2: 100% 97%   BP soft: d/c Coreg. asymptomatic  6/20- BP very slightly better 94/64- but no Sx's of orthostasis- con't off Coreg.   12.  Hyperlipidemia.  Crestor 13.  New findings diabetes mellitus.  Hemoglobin A1c 6.5.  SSI.  .  Diabetic teaching CBG (last 3)  Recent Labs    08/19/20 1628 08/19/20 2126 08/20/20 0616  GLUCAP 105* 99 101*  6/20- Bgs controlled- con't reigmen 14.  Tobacco abuse.  NicoDerm patch.  Counseling  15. Vascular congestion/coughing  Improved with albuterol   16.  Cardiomyopathy ej fx 35-40%- cont lanoxin, off coreg , spironolactone due to low BP- no signs of failure  F/u cardiology as OP   LOS: 10 days A FACE TO FACE EVALUATION WAS PERFORMED  Brittanni Cariker 08/20/2020, 9:12 AM

## 2020-08-20 NOTE — Progress Notes (Signed)
Nutrition Follow-up  DOCUMENTATION CODES:   Severe malnutrition in context of chronic illness, Underweight  INTERVENTION:  Provide Ensure Enlive po BID (thickened to appropriate consistency), each supplement provides 350 kcal and 20 grams of protein  Encourage adequate PO intake.   NUTRITION DIAGNOSIS:   Severe Malnutrition related to chronic illness as evidenced by severe fat depletion, severe muscle depletion; ongoing  GOAL:   Patient will meet greater than or equal to 90% of their needs; progressing  MONITOR:   PO intake, Supplement acceptance, Diet advancement, Skin, Weight trends, Labs, I & O's  REASON FOR ASSESSMENT:   Consult Enteral/tube feeding initiation and management  ASSESSMENT:   Pt with limited past medical history who was initially found down by family at home. EMS was activated and patient was found to be in ventricular fibrillation. Patient was shocked 5 times and given epinephrine and amiodarone with ROSC obtained approximately within 15 minutes. Patient was intubated, started on vasopressors for cardiogenic shock and transferred to the intensive care unit. Patient also was emergently taken to the catheterization lab due to ST segment changes in anterior leads and underwent revascularization of his LAD. Milrinone drip was slowly weaned off. Patient was extubated on 07/31/2020 and later transferred to the hospitalist service. Admitted to CIR on 6/10.  6/17 - s/p MBS, diet advanced to dysphagia 1 with nectar-thick liquids  Meal completion 100%. Cortrak removed yesterday. Tube feeding discontinued. Pt has been tolerating his PO. RD to order nutritional supplements to aid in caloric and protein needs. Pt encouraged to eat his food at meals and to drink her supplements.   Labs and medications reviewed.   Diet Order:   Diet Order             DIET - DYS 1 Room service appropriate? Yes; Fluid consistency: Nectar Thick  Diet effective now                    EDUCATION NEEDS:   Not appropriate for education at this time  Skin:  Skin Assessment: Reviewed RN Assessment  Last BM:  6/20  Height:   Ht Readings from Last 1 Encounters:  08/10/20 5\' 4"  (1.626 m)    Weight:   Wt Readings from Last 1 Encounters:  08/20/20 50 kg   BMI:  Body mass index is 18.92 kg/m.  Estimated Nutritional Needs:   Kcal:  1500-1700  Protein:  80-105g  Fluid:  >1.5L/d  08/22/20, MS, RD, LDN RD pager number/after hours weekend pager number on Amion.

## 2020-08-21 LAB — URINALYSIS, ROUTINE W REFLEX MICROSCOPIC
Bilirubin Urine: NEGATIVE
Glucose, UA: NEGATIVE mg/dL
Ketones, ur: NEGATIVE mg/dL
Nitrite: POSITIVE — AB
Protein, ur: 30 mg/dL — AB
Specific Gravity, Urine: 1.01 (ref 1.005–1.030)
WBC, UA: >50 WBC/hpf — ABNORMAL HIGH (ref 0–5)
pH: 8 (ref 5.0–8.0)

## 2020-08-21 LAB — GLUCOSE, CAPILLARY
Glucose-Capillary: 124 mg/dL — ABNORMAL HIGH (ref 70–99)
Glucose-Capillary: 115 mg/dL — ABNORMAL HIGH (ref 70–99)
Glucose-Capillary: 147 mg/dL — ABNORMAL HIGH (ref 70–99)
Glucose-Capillary: 144 mg/dL — ABNORMAL HIGH (ref 70–99)

## 2020-08-21 LAB — COMPREHENSIVE METABOLIC PANEL
ALT: 26 U/L (ref 0–44)
AST: 22 U/L (ref 15–41)
Albumin: 2.3 g/dL — ABNORMAL LOW (ref 3.5–5.0)
Alkaline Phosphatase: 89 U/L (ref 38–126)
Anion gap: 5 (ref 5–15)
BUN: 10 mg/dL (ref 8–23)
CO2: 28 mmol/L (ref 22–32)
Calcium: 8.4 mg/dL — ABNORMAL LOW (ref 8.9–10.3)
Chloride: 102 mmol/L (ref 98–111)
Creatinine, Ser: 0.65 mg/dL (ref 0.61–1.24)
GFR, Estimated: >60 mL/min (ref >60)
Glucose, Bld: 96 mg/dL (ref 70–99)
Potassium: 3.9 mmol/L (ref 3.5–5.1)
Sodium: 135 mmol/L (ref 135–145)
Total Bilirubin: 0.3 mg/dL (ref 0.3–1.2)
Total Protein: 5.2 g/dL — ABNORMAL LOW (ref 6.5–8.1)

## 2020-08-21 NOTE — Progress Notes (Signed)
Speech Language Pathology Daily Session Note  Patient Details  Name: Vincent Horton MRN: [id number] Date of Birth: [birth year]  Today's Date: 08/21/2020 SLP Individual Time: 1435-1530 SLP Individual Time Calculation (min): 55 min  Short Term Goals: Week 2: SLP Short Term Goal 1 (Week 2): STG-=LTG  Skilled Therapeutic Interventions:Skilled ST services focused on education and swallow skills. Pt completed oral care prior to trials of thin liquids then dys 2-3 textures. Pt consumed 40z of thin liquids in which swallow appeared timely and vocal quality remained dry. Pt did demonstrated x1 delayed throat clear following liquid trials. Pt consumed dys 2 texture and dys 3 texture snack, pt required extra time for mastication and bolus cohesion, however pt supports this is baseline due to missing dentition. Pt required mod A verbal cues for liquid wash to remove mild lingual residue and demonstrated x1 delayed cough (likely during his piecemeal  swallow) with dys 3 textures. SLP ordered repeat MBS tomorrow, 6/22 to assess current swallow function and improvement. Pt completed x1 EMST total of 10 repetitions with mod A verbal cues to continue consistent respiratory strength. Pt's niece entered room and supported pt's speech is back to baseline with a h/o of low vocal intensity and slow speech pattern but confirmed acute changes in cognition and swallowing. SLP provided education pertaining to need for 24 hour supervision A due to reduced safety awareness and intellectual awareness as well as requesting family comes in for education. Pt's nieces reported she will contact pt's daughter. Pt was left in room with call bell within reach and bed alarm set. SLP recommends to continue skilled services.     Pain Pain Assessment Pain Score: 0-No pain  Therapy/Group: Individual Therapy  [name]  [facility] 08/21/2020, 3:48 PM

## 2020-08-21 NOTE — Progress Notes (Signed)
Physical Therapy Session Note  Patient Details  Name: Vincent Horton MRN: [id number] Date of Birth: [birth year]  Today's Date: 08/21/2020 PT Individual Time: 1012-1102 PT Individual Time Calculation (min): 50 min   Short Term Goals: Week 2:  PT Short Term Goal 1 (Week 2): = to LTGs based on ELOS  Skilled Therapeutic Interventions/Progress Updates:    Pt received supine in bed and agreeable to therapy session. Supine>sitting R EOB, HOB slightly elevated, supervision. Sit<>stands, no AD, CGA for safety. Pt reports he prefers to use RW and therapist educated on recommendation to use it ad D/C to improve his balance and decrease fall risk - pt verbalizes understanding. Gait training ~11ft to main therapy gym using RW with CGA - narrow BOS, intermittent poor foot clearance causing slight toe catching and minor anterior LOB with pt able to recover using RW support without increased assistance.  Stair navigation training w/o HRs to prepare for home environment - ascended/descended 8 stairs using HHA then final 4 steps no UE support with min assist throughout for balance due to anterior lean on ascent- ascends reciprocal stepping, descends step-to.  Gait training in therapy gym using RW with CGA for steadying throughout with focus on safe, proper AD management - primarily requires cuing for AD management when turning to sit.   Side stepping in // bars using level 2 theraband resistance around knees targeting hip abductor activation down/back 2x with min assist/CGA for balance, no UE support, - cuing for increased step length.   Repeated sit<>stands to/from EOM, no UE support, level 2 theraband resistance around knees to increase hip abductor activation x10reps.  Repeated stepping up/down on/off 1 6" step, no UE support, with CGA/min assist for balance - no significant LOB noted - x10reps each LE.   Standing heel raises 2x10 reps no UE support with CGA for safety.   Pt starts reporting LE "tired" in the  muscles and overall fatigue, requests to return to room. Gait training ~214ft using RW with CGA progressing towards supervision - pt demos improving B LE foot clearance. Sit>supine supervision. Pt left supine in bed with needs in reach and bed alarm on.   Therapy Documentation Precautions:  Precautions Precautions: Fall, Other (comment) Precaution Comments: cortrak, slightly impulsive Restrictions Weight Bearing Restrictions: No   Pain:  Denies pain during session.   Therapy/Group: Individual Therapy  Ginny Forth , PT, DPT, CSRS 08/21/2020, 7:48 AM

## 2020-08-21 NOTE — Progress Notes (Signed)
PROGRESS NOTE   Subjective/Complaints: Hematuria reported, Hgb stable   ROS: Limited due to language/cognition  Objective:   No results found. Recent Labs    08/20/20 2332  WBC 9.0  HGB 10.1*  HCT 31.6*  PLT 263   Recent Labs    08/20/20 2332  NA 135  K 3.9  CL 102  CO2 28  GLUCOSE 96  BUN 10  CREATININE 0.65  CALCIUM 8.4*    Intake/Output Summary (Last 24 hours) at 08/21/2020 0729 Last data filed at 08/21/2020 0116 Gross per 24 hour  Intake 240 ml  Output 75 ml  Net 165 ml          Physical Exam: Vital Signs Blood pressure 90/62, pulse 85, temperature 98.7 F (37.1 C), temperature source Oral, resp. rate 18, height 5\' 4"  (1.626 m), weight 48.9 kg, SpO2 97 %.     General: No acute distress Mood and affect are appropriate Heart: Regular rate and rhythm no rubs murmurs or extra sounds Lungs: Clear to auscultation, breathing unlabored, no rales or wheezes Abdomen: Positive bowel sounds, soft nontender to palpation, nondistended Extremities: No clubbing, cyanosis, or edema Skin: No evidence of breakdown, no evidence of rash Neurologic:  motor strength is 4/5 in bilateral deltoid, bicep, tricep, grip, hip flexor, knee extensors, ankle dorsiflexor and plantar flexor   Musculoskeletal:        General: No swelling. Normal range of motion.    Cervical back: Normal range of motion. Skin:    General: Skin is warm. Neurological:    Mental Status: He is alert.    Comments: Patient is alert.  With assistance of video interpreter he does follow simple commands.  Provides year in person but decreased awareness and insight as a whole  Patient is dysphonic. Moves all 4 limbs. Senses pain.      Assessment/Plan: 1. Functional deficits which require 3+ hours per day of interdisciplinary therapy in a comprehensive inpatient rehab setting. Physiatrist is providing close team supervision and 24 hour management  of active medical problems listed below. Physiatrist and rehab team continue to assess barriers to discharge/monitor patient progress toward functional and medical goals  Care Tool:  Bathing    Body parts bathed by patient: Right arm, Left arm, Chest, Abdomen, Front perineal area, Buttocks, Right upper leg, Left upper leg, Face, Left lower leg, Right lower leg         Bathing assist Assist Level: Minimal Assistance - Patient > 75%     Upper Body Dressing/Undressing Upper body dressing   What is the patient wearing?: Pull over shirt    Upper body assist Assist Level: Set up assist    Lower Body Dressing/Undressing Lower body dressing      What is the patient wearing?: Incontinence brief, Pants     Lower body assist Assist for lower body dressing: Minimal Assistance - Patient > 75%     Toileting Toileting    Toileting assist Assist for toileting: Moderate Assistance - Patient 50 - 74%     Transfers Chair/bed transfer  Transfers assist     Chair/bed transfer assist level: Contact Guard/Touching assist     Locomotion Ambulation   Ambulation  assist      Assist level: Contact Guard/Touching assist Assistive device: No Device Max distance: 150'   Walk 10 feet activity   Assist     Assist level: Contact Guard/Touching assist Assistive device: No Device   Walk 50 feet activity   Assist    Assist level: Contact Guard/Touching assist Assistive device: No Device    Walk 150 feet activity   Assist Walk 150 feet activity did not occur: Safety/medical concerns  Assist level: Contact Guard/Touching assist Assistive device: No Device    Walk 10 feet on uneven surface  activity   Assist     Assist level: Moderate Assistance - Patient - 50 - 74% Assistive device: Other (comment), Hand held assist (railing)   Wheelchair     Assist Will patient use wheelchair at discharge?: No             Wheelchair 50 feet with 2 turns  activity    Assist            Wheelchair 150 feet activity     Assist          Blood pressure 90/62, pulse 85, temperature 98.7 F (37.1 C), temperature source Oral, resp. rate 18, height 5\' 4"  (1.626 m), weight 48.9 kg, SpO2 97 %.  Medical Problem List and Plan: 1.  Debility with altered mental status secondary to CAD/anterior STEMI /cardiogenic shock with occluded proximal LAD status post stenting post subacute left frontal infarct             -patient may shower             -ELOS/Goals: 16-20 days, Mod I with PT/OT, supervision SLP  Continue CIR- PT, OT and SLP 2.  Impaired mobility: continue Lovenox             -antiplatelet therapy: Aspirin 81 mg daily, Brilinta 90 mg twice daily 3. Pain Management: Denies pain. Change Oxycodone to as needed 4. Anxiety: Continue Klonopin 0.5 mg 3 times daily             -antipsychotic agents: N/A 5. Neuropsych: This patient is not capable of making decisions on his own behalf.             -is fall risk, safety plan needed 6. Skin/Wound Care: Routine skin checks 7. Fluids/Electrolytes/Nutrition: Routine in and outs with follow-up chemistries on admit. 8.  Acute hypoxemic respiratory failure.  Patient extubated.  Suspect HCAP completing 7-day course of antibiotic therapy 9.  Right pleural effusion.  Status postthoracentesis 08/01/2020 with 450 cc fluid removal.  Follow-up chest x-ray 08/02/2020 without effusion 10.  Dysphagia.  NPO. NGT feedings currently -f/u MBS, plan per SLP  Now on D1 Nectar diet- eating 100% , drinking well, takes meds in applesauce  11.  Hypertension.  Coreg 3.125 mg twice daily Vitals:   08/21/20 0153 08/21/20 0727  BP: 91/63 90/62  Pulse: 87 85  Resp: 17 18  Temp: 98 F (36.7 C) 98.7 F (37.1 C)  SpO2: 97% 97%  BP still soft monitor for tachy 12.  Hyperlipidemia.  Crestor 13.  New findings diabetes mellitus.  Hemoglobin A1c 6.5.  SSI.  .  Diabetic teaching CBG (last 3)  Recent Labs    08/20/20 1700  08/20/20 2102 08/21/20 0602  GLUCAP 93 157* 124*   6/21 controlled  14.  Tobacco abuse.  NicoDerm patch.  Counseling  15. Vascular congestion/coughing  Improved with albuterol   16.  Cardiomyopathy ej fx 35-40%- cont lanoxin, off  coreg , spironolactone due to low BP- no signs of failure  F/u cardiology as OP 16.  Hematuria- wine (rose') colored urine saved in cup- no drop in hgb, send for UA C and S, monitor  LOS: 11 days A FACE TO FACE EVALUATION WAS PERFORMED  Erick Colace 08/21/2020, 7:29 AM

## 2020-08-21 NOTE — Progress Notes (Signed)
Physical Therapy Note  Patient Details  Name: Vincent Horton MRN: [id number] Date of Birth: 04/05/1955 Today's Date: 08/21/2020    Pt refused therapy this PM.  Attempted to educate need for continued therapy for safe D/C to home.  Pt still continues to refuse, saying "tomorrow".  Pt has blankets over head and unwilling to participate, stating he has already done enough today.   [name] 08/21/2020, 1:54 PM

## 2020-08-21 NOTE — [place] FL2 (Signed)
[place] MEDICAID FL2 LEVEL OF CARE SCREENING TOOL     IDENTIFICATION  Patient Name: Vincent Horton Birthdate: 1955-06-18 Sex: male Admission Date (Current Location): 08/10/2020  [facility] and [place] Number:  Producer, television/film/video and Address:  [facility], [address] [place], [place] [address]      Provider Number:    Attending Physician Name and Address:  Erick Colace, MD  Relative Name and Phone Number:  [phone]    Current Level of Care: SNF Recommended Level of Care: Skilled Nursing Facility Prior Approval Number:    Date Approved/Denied:   PASRR Number:    Discharge Plan: SNF    Current Diagnoses: Patient Active Problem List   Diagnosis Date Noted   CVA (cerebral vascular accident) (HCC) 08/10/2020   Pleural effusion    Protein-calorie malnutrition, severe 07/25/2020   Acute hypoxemic respiratory failure (HCC) 07/24/2020   Ischemic cardiomyopathy 07/24/2020   Aspiration pneumonia (HCC) 07/24/2020   Pulmonary edema cardiac cause (HCC) 07/24/2020   Acute kidney injury (HCC) 07/24/2020   Shock liver 07/24/2020   Cardiogenic shock (HCC) 07/24/2020   Transaminitis 07/24/2020   Protein calorie malnutrition (HCC) 07/23/2020   Acute ST elevation myocardial infarction (STEMI) due to occlusion of left anterior descending (LAD) coronary artery (HCC) 07/21/2020   Cardiac arrest (HCC) 07/21/2020   STEMI involving left anterior descending coronary artery (HCC) 07/21/2020    Orientation RESPIRATION BLADDER Height & Weight     Self, Time  Normal Incontinent Weight: 107 lb 12.9 oz (48.9 kg) Height:  5\' 4"  (162.6 cm)  BEHAVIORAL SYMPTOMS/MOOD NEUROLOGICAL BOWEL NUTRITION STATUS      Incontinent Diet  AMBULATORY STATUS COMMUNICATION OF NEEDS Skin   Limited Assist   Normal (Scab on chest and abdomen. Remainder of skin intact.)                       Personal Care Assistance Level of Assistance  Bathing, Feeding, Dressing  Bathing Assistance: Limited assistance Feeding assistance: Limited assistance Dressing Assistance: Limited assistance     Functional Limitations Info        Speech Info: Impaired    SPECIAL CARE FACTORS FREQUENCY  PT (By licensed PT), OT (By licensed OT), Speech therapy     PT Frequency: 5x a week OT Frequency: 5 x a week     Speech Therapy Frequency: 5x a week      Contractures      Additional Factors Info    Code Status Info: Full             Current Medications (08/21/2020):  This is the current hospital active medication list Current Facility-Administered Medications  Medication Dose Route Frequency Provider Last Rate Last Admin   acetaminophen (TYLENOL) 160 MG/5ML solution 650 mg  650 mg Per Tube Q6H PRN Angiulli, 08/23/2020, PA-C       albuterol (PROVENTIL) (2.5 MG/3ML) 0.083% nebulizer solution 2.5 mg  2.5 mg Nebulization Q4H PRN Lovorn, Megan, MD   2.5 mg at 08/12/20 0524   aspirin chewable tablet 81 mg  81 mg Per Tube Daily 10/12/20, PA-C   81 mg at 08/21/20 0744   chlorhexidine (PERIDEX) 0.12 % solution 15 mL  15 mL Mouth Rinse BID Lovorn, Megan, MD   15 mL at 08/21/20 0745   clonazePAM (KLONOPIN) disintegrating tablet 0.5 mg  0.5 mg Per Tube TID 08/23/20, PA-C   0.5 mg at 08/21/20 0744   digoxin (  LANOXIN) tablet 0.125 mg  0.125 mg Per Tube Daily Charlton Amor, PA-C   0.125 mg at 08/21/20 0744   feeding supplement (ENSURE ENLIVE / ENSURE PLUS) liquid 237 mL  237 mL Oral BID BM Kirsteins, Victorino Sparrow, MD   237 mL at 08/21/20 1225   guaiFENesin (ROBITUSSIN) 100 MG/5ML solution 100 mg  5 mL Oral Q4H PRN Charlton Amor, PA-C   100 mg at 08/14/20 2249   insulin aspart (novoLOG) injection 0-15 Units  0-15 Units Subcutaneous TID WC & HS Kirsteins, Victorino Sparrow, MD   2 Units at 08/21/20 0606   ipratropium-albuterol (DUONEB) 0.5-2.5 (3) MG/3ML nebulizer solution 3 mL  3 mL Nebulization Q4H PRN Angiulli, Mcarthur Rossetti, PA-C       loperamide HCl (IMODIUM) 1  MG/7.5ML suspension 1 mg  1 mg Per Tube PRN Angiulli, Mcarthur Rossetti, PA-C       MEDLINE mouth rinse  15 mL Mouth Rinse q12n4p Lovorn, Megan, MD   15 mL at 08/21/20 1225   nicotine (NICODERM CQ - dosed in mg/24 hours) patch 14 mg  14 mg Transdermal Daily Charlton Amor, PA-C   14 mg at 08/21/20 0744   oxyCODONE (Oxy IR/ROXICODONE) immediate release tablet 5 mg  5 mg Per Tube Q8H PRN Horton Chin, MD   5 mg at 08/16/20 2056   rosuvastatin (CRESTOR) tablet 40 mg  40 mg Per Tube Daily Charlton Amor, PA-C   40 mg at 08/21/20 0744   sorbitol 70 % solution 30 mL  30 mL Oral Daily PRN Erick Colace, MD   30 mL at 08/16/20 1827   ticagrelor (BRILINTA) tablet 90 mg  90 mg Per Tube BID Charlton Amor, PA-C   90 mg at 08/21/20 6433     Discharge Medications: Please see discharge summary for a list of discharge medications.  Relevant Imaging Results:  Relevant Lab Results:   Additional Information    Andria Rhein

## 2020-08-21 NOTE — Progress Notes (Signed)
Patient ID: Vincent Horton, male   DOB: 1955/10/13, 64 y.o.   MRN: [id number]  SW received call from patient son Roseanne Reno). Family would like to discuss SNF. Roseanne Reno will follow up with SW on tomorrow of determination and preferred facilities. SNF list emailed to pt son. SW will continue to follow up  Lavera Guise, [place] [phone]

## 2020-08-21 NOTE — Progress Notes (Signed)
Occupational Therapy Session Note  Patient Details  Name: Alazar Cherian MRN: [id number] Date of Birth: 1955/11/21  Today's Date: 08/21/2020 OT Individual Time: 3559-7416 OT Individual Time Calculation (min): 51 min    Short Term Goals: Week 2:  OT Short Term Goal 1 (Week 2): Continue working on established goals at supervision level.  Skilled Therapeutic Interventions/Progress Updates:    Pt completed donning shoes sitting EOB with setup and then ambulated down to the therapy gym with min guard assist and no assistive device.  He was able to complete BUE strengthening throughout session with completion of the UE ergonometer for 3 intervals of 5 mins on Random Programming.  Intensity set on level 10 with 2 sets peddling forward and one backwards.  He then transferred over to the therapy mat where he continued with BUE strengthening with use of the 4 lb ankle weights.  He completed ball toss and catch for 20 repetitions X2 with 2 min rest break in between.  Finished session with return to the room and with the call button and phone in reach and pt in supine.    Therapy Documentation Precautions:  Precautions Precautions: Fall, Other (comment) Precaution Comments: cortrak, slightly impulsive Restrictions Weight Bearing Restrictions: No  Pain: Pain Assessment Pain Scale: Faces Pain Score: 0-No pain ADL: See Care Tool Section for some details of mobility and selfcare   Therapy/Group: Individual Therapy  Lauralei Clouse OTR/L 08/21/2020, 12:51 PM

## 2020-08-21 NOTE — Progress Notes (Signed)
Patient ID: Vincent Horton, male   DOB: 11/09/1955, 65 y.o.   MRN: [id number]  SW made attempt to call patients son and daughter. No answer, left VM  Lavera Guise, [place] [phone]

## 2020-08-22 ENCOUNTER — Inpatient Hospital Stay (HOSPITAL_COMMUNITY): Payer: Medicare Other

## 2020-08-22 LAB — GLUCOSE, CAPILLARY
Glucose-Capillary: 234 mg/dL — ABNORMAL HIGH (ref 70–99)
Glucose-Capillary: 84 mg/dL (ref 70–99)
Glucose-Capillary: 126 mg/dL — ABNORMAL HIGH (ref 70–99)

## 2020-08-22 MED ORDER — CEPHALEXIN 250 MG PO CAPS
250.0000 mg | ORAL_CAPSULE | Freq: Three times a day (TID) | ORAL | Status: DC
  Administered 2020-08-22 – 2020-08-24 (×7): 250 mg via ORAL
  Filled 2020-08-22 (×7): qty 1

## 2020-08-22 MED ORDER — TRAZODONE HCL 50 MG PO TABS
50.0000 mg | ORAL_TABLET | Freq: Every evening | ORAL | Status: DC | PRN
  Administered 2020-08-22: 50 mg via ORAL
  Filled 2020-08-22: qty 1

## 2020-08-22 MED ORDER — OXYCODONE HCL 5 MG PO TABS
5.0000 mg | ORAL_TABLET | Freq: Three times a day (TID) | ORAL | Status: DC | PRN
Start: 2020-08-22 — End: 2020-08-24

## 2020-08-22 MED ORDER — ROSUVASTATIN CALCIUM 20 MG PO TABS
40.0000 mg | ORAL_TABLET | Freq: Every day | ORAL | Status: DC
  Administered 2020-08-22 – 2020-08-24 (×3): 40 mg via ORAL
  Filled 2020-08-22 (×3): qty 2

## 2020-08-22 MED ORDER — DIGOXIN 125 MCG PO TABS
0.1250 mg | ORAL_TABLET | Freq: Every day | ORAL | Status: DC
  Administered 2020-08-22 – 2020-08-24 (×3): 0.125 mg via ORAL
  Filled 2020-08-22 (×3): qty 1

## 2020-08-22 MED ORDER — CLONAZEPAM 0.25 MG PO TBDP
0.5000 mg | ORAL_TABLET | Freq: Three times a day (TID) | ORAL | Status: DC
  Administered 2020-08-22 – 2020-08-24 (×7): 0.5 mg via ORAL
  Filled 2020-08-22 (×7): qty 2

## 2020-08-22 MED ORDER — ASPIRIN 81 MG PO CHEW
81.0000 mg | CHEWABLE_TABLET | Freq: Every day | ORAL | Status: DC
  Administered 2020-08-22 – 2020-08-24 (×3): 81 mg via ORAL
  Filled 2020-08-22 (×2): qty 1

## 2020-08-22 MED ORDER — TICAGRELOR 90 MG PO TABS
90.0000 mg | ORAL_TABLET | Freq: Two times a day (BID) | ORAL | Status: DC
  Administered 2020-08-22 – 2020-08-24 (×5): 90 mg via ORAL
  Filled 2020-08-22 (×5): qty 1

## 2020-08-22 MED ORDER — ACETAMINOPHEN 160 MG/5ML PO SOLN: 650.0000 mg | Freq: Four times a day (QID) | ORAL | Status: DC | PRN

## 2020-08-22 NOTE — Progress Notes (Signed)
Occupational Therapy Session Note  Patient Details  Name: Vincent Horton MRN: [id number] Date of Birth: [birth year]  Today's Date: 08/22/2020 OT Individual Time: 5852-7782 OT Individual Time Calculation (min): 55 min    Short Term Goals: Week 2:  OT Short Term Goal 1 (Week 2): Continue working on established goals at supervision level.  Skilled Therapeutic Interventions/Progress Updates:    Pt completed bathing and dressing sit to stand at the sink to start.  He was able to complete transfer to the EOB and over to the sink with supervision.  He was able to complete UB selfcare in standing with supervision.  LB bathing and dressing were completed with supervision as well sit to stand.  He demonstrated awareness of the need to sit for doffing and donning his LB clothing as well as for washing his lower legs.  He completed donning shoes with setup as well as min guard for ambulating in the room and out in the hallway without an assistive device.  Had him work on balance reactions with use of the Biodex.  He was able to complete Limits of Stability with 15-20 % for 3 intervals.  He was able to complete Random Control Program for 4 mins at 73%.  Finished session with ambulation back to the room at min guard where he transferred back to the bed at supervision. Call button and phone in reach at end of session.   Therapy Documentation Precautions:  Precautions Precautions: Fall, Other (comment) Precaution Comments: impulsivity Restrictions Weight Bearing Restrictions: No  Pain: Pain Assessment Pain Scale: 0-10 Pain Score: 0-No pain See Care Tool Section for some details of mobility and selfcare   Therapy/Group: Individual Therapy  Vincent Horton OTR/L 08/22/2020, 10:46 AM

## 2020-08-22 NOTE — Patient Care Conference (Signed)
Inpatient RehabilitationTeam Conference and Plan of Care Update Date: 08/22/2020   Time: 10:47 AM    Patient Name: Vincent Horton      Medical Record Number: [id number]  Date of Birth: 07/25/1955 Sex: Male         Room/Bed: 4M02C/4M02C-01 Payor Info: Payor: MEDICARE / Plan: MEDICARE PART A AND B / Product Type: *No Product type* /    Admit Date/Time:  08/10/2020  2:43 PM  Primary Diagnosis:  CVA (cerebral vascular accident) [facility])  Hospital Problems: Principal Problem:   CVA (cerebral vascular accident) [facility])    Expected Discharge Date: Expected Discharge Date: 08/24/20  Team Members Present: Physician leading conference: Dr. Claudette Laws Care Coodinator Present: Lavera Guise, BSW;Olie Scaffidi Lambert Mody, RN, BSN, CRRN Nurse Present: Chana Bode, RN PT Present: Casimiro Needle, PT OT Present: Perrin Maltese, OT SLP Present: Other (comment) PPS Coordinator present : Fae Pippin, SLP     Current Status/Progress Goal Weekly Team Focus  Bowel/Bladder             Swallow/Nutrition/ Hydration   dsy 1 and nectar thick liquds, MBS 6/22  Min A  MBS 6/22   ADL's   setup for UB selfcare with min guard for LB selfcare and for transfers to the toilet as well as the walk in shower.  Decreased overall endurance, needs encouragement at times to fully participate.  supervision overall  selfcare retraining, balance retraining, neuromuscular re-education, therapeutic exercise, DME education, pt/family education   Mobility   supervision bed mobility, CGA sit<>stand and stand pivot transfers using RW, gait up to 248ft using RW with CGA, min assist 12 stairs without HRs - continues to demo narrow BOS with decreased step lengths as well as impaired safety awareness though improving  downgraded to CGA overall at ambulatory level  activity tolerance, dynamic standing balance, B LE strengthening, dynamic gait training, stair navigation, pt/family education, D/C planning   Communication   Mod-mIn A -  pt's niece confirms back to baseline will check with children during education  Min A  EMST   Safety/Cognition/ Behavioral Observations  Mod A safety/intellecual awareness, main focus has been on swallowing/speech  Min A  education, basic problem solving, intellectual awareness, recall and sustained attention   Pain             Skin               Discharge Planning:  Pt will d/c home with children or SNF   Team Discussion: Discussion of discharge to SNF vs home; son confirmed plans to take patient home; family will pull together assistance. General weakness noted and BP runs low normally. Note poor endurance and impulsivity limiting progress and reluctance to use adaptive equipment. Niece reports cognition is back to baseline level.   Patient on target to meet rehab goals: yes, currently  supervision for ADLs and min guard for toileting/transfers.  Able to ambulate 150' with CGA. Can manage steps without a rail but requires assistance due to anterior lean and catches his toes. Min assist  goal for dysphagia and mod assist for communication set for discharge.   *See Care Plan and progress notes for long and short-term goals.   Revisions to Treatment Plan:  Working on higher level balance without and assistive device and practice with steps.  MBS scheduled for 08/22/20 and currently on D1 diet Nectar thick liquids  Teaching Needs: Safety tips, medications, etc  Current Barriers to Discharge: Decreased caregiver support, Home enviroment access/layout, and family work during the  day  Possible Resolutions to Barriers: Family education on day of discharge OP follow up to address higher level balance and practice steps     Medical Summary Current Status: No longer requires tube feedings.  Blood pressure well controlled but on the low side.     Possible Resolutions to [employer] Focus: Family needs training now that they have decided to take pt home   Continued Need for Acute  Rehabilitation Level of Care: The patient requires daily medical management by a physician with specialized training in physical medicine and rehabilitation for the following reasons: Direction of a multidisciplinary physical rehabilitation program to maximize functional independence : Yes Medical management of patient stability for increased activity during participation in an intensive rehabilitation regime.: Yes   I attest that I was present, lead the team conference, and concur with the assessment and plan of the team.   Pamelia Hoit 08/22/2020, 12:43 PM

## 2020-08-22 NOTE — Progress Notes (Signed)
Patient ID: Vincent Horton, male   DOB: 04-Feb-1956, 65 y.o.   MRN: [id number]  Pt set with with PCP follow up at [facility] primary care on Monday September 12th at 1:50 PM  Lavera Guise, [place] [phone]

## 2020-08-22 NOTE — Progress Notes (Signed)
Occupational Therapy Discharge Summary  Patient Details  Name: Vincent Horton MRN: [id number] Date of Birth: 05-31-1955  Patient has met 7 of 7 long term goals due to improved activity tolerance, improved balance, ability to compensate for deficits, improved attention, and improved awareness.  Patient to discharge at overall Supervision level.  Patient's care partner is independent to provide the necessary physical and cognitive assistance at discharge.    Reasons goals not met: NA  Recommendation:  Pt continues to need supervision for safety secondary to balance deficits and decreased awareness, but feel this can be addressed with follow-up outpatient PT and SLP as needed.    Recommend 24 hr supervision from family initially until balance improves.   Equipment: No equipment provided  Reasons for discharge: treatment goals met and discharge from hospital  Patient/family agrees with progress made and goals achieved: Yes  OT Discharge Precautions/Restrictions  Precautions Precautions: Fall;Other (comment) Precaution Comments: impulsivity Restrictions Weight Bearing Restrictions: No  Pain  No report of pain ADL ADL Eating: Supervision/safety Where Assessed-Eating: Edge of bed Grooming: Supervision/safety Where Assessed-Grooming: Standing at sink Upper Body Bathing: Supervision/safety Where Assessed-Upper Body Bathing: Shower, Chair Lower Body Bathing: Supervision/safety Where Assessed-Lower Body Bathing: Shower Upper Body Dressing: Setup Where Assessed-Upper Body Dressing: Edge of bed Lower Body Dressing: Supervision/safety Where Assessed-Lower Body Dressing: Edge of bed Toileting: Supervision/safety Where Assessed-Toileting: Glass blower/designer: Close supervision Toilet Transfer Method: Counselling psychologist: Raised toilet seat Tub/Shower Transfer: Close supervison Clinical cytogeneticist Method: Optometrist: Civil engineer, contracting with back Press photographer: Close supervision Social research officer, government Method: Heritage manager: Civil engineer, contracting with back Vision Baseline Vision/History: No visual deficits Patient Visual Report: No change from baseline Vision Assessment?: No apparent visual deficits Perception  Perception: Within Functional Limits Praxis Praxis: Intact Cognition Overall Cognitive Status: Impaired/Different from baseline Arousal/Alertness: Awake/alert Attention: Selective Focused Attention: Appears intact Sustained Attention: Appears intact Selective Attention: Impaired Selective Attention Impairment: Functional basic Divided Attention: Impaired Divided Attention Impairment: Functional basic Memory: Impaired Memory Impairment: Decreased recall of new information Awareness: Impaired Awareness Impairment: Anticipatory impairment Behaviors: Impulsive Safety/Judgment: Impaired Sensation Sensation Light Touch: Appears Intact Hot/Cold: Appears Intact Proprioception: Appears Intact Stereognosis: Appears Intact Coordination Gross Motor Movements are Fluid and Coordinated: Yes Fine Motor Movements are Fluid and Coordinated: Yes Coordination and Movement Description: BUE functional use WFLs for selfcare tasks and ADLs. Motor  Motor Motor: Within Functional Limits Motor - Discharge Observations: Generalized weakness Mobility  Bed Mobility Bed Mobility: Sit to Supine;Supine to Sit Supine to Sit: Independent Sit to Supine: Independent Transfers Sit to Stand: Supervision/Verbal cueing Stand to Sit: Supervision/Verbal cueing  Trunk/Postural Assessment  Cervical Assessment Cervical Assessment: Within Functional Limits Thoracic Assessment Thoracic Assessment: Within Functional Limits Lumbar Assessment Lumbar Assessment: Exceptions to [facility] (posterior pelvic tilt)  Balance Balance Balance Assessed: Yes Dynamic Sitting Balance Dynamic Sitting - Balance Support: Feet supported;During  functional activity Dynamic Sitting - Level of Assistance: 7: Independent Static Standing Balance Static Standing - Balance Support: During functional activity;No upper extremity supported Static Standing - Level of Assistance: 5: Stand by assistance Dynamic Standing Balance Dynamic Standing - Balance Support: During functional activity;No upper extremity supported Dynamic Standing - Level of Assistance: 4: Min assist;5: Stand by assistance (min guard) Extremity/Trunk Assessment RUE Assessment RUE Assessment: Within Functional Limits General Strength Comments: strength 4/5 throughout LUE Assessment General Strength Comments: strength 4/5 througohout   MCGUIRE,JAMES OTR/L 08/22/2020, 4:38 PM

## 2020-08-22 NOTE — Progress Notes (Signed)
Patient ID: Vincent Horton, male   DOB: 1955-09-14, 65 y.o.   MRN: [id number] Team Conference Report to Patient/Family  Team Conference discussion was reviewed with the patient and caregiver, including goals, any changes in plan of care and target discharge date.  Patient and caregiver express understanding and are in agreement.  The patient has a target discharge date of 08/24/20.  SW spoke to patient spouse. Provided conference updates, family education scheduled tomorrow 2-4 PM. No other questions or concerns. SW will con to follow up.  Andria Rhein 08/22/2020, 11:35 AM

## 2020-08-22 NOTE — Progress Notes (Signed)
PROGRESS NOTE   Subjective/Complaints: "Need something for sleep " UA + Allergies reviewed  ROS: Limited due to language/cognition  Objective:   No results found. Recent Labs    08/20/20 2332  WBC 9.0  HGB 10.1*  HCT 31.6*  PLT 263    Recent Labs    08/20/20 2332  NA 135  K 3.9  CL 102  CO2 28  GLUCOSE 96  BUN 10  CREATININE 0.65  CALCIUM 8.4*     Intake/Output Summary (Last 24 hours) at 08/22/2020 0751 Last data filed at 08/22/2020 0727 Gross per 24 hour  Intake 480 ml  Output 600 ml  Net -120 ml          Physical Exam: Vital Signs Blood pressure (!) 91/57, pulse 88, temperature 98.2 F (36.8 C), resp. rate 19, height 5\' 4"  (1.626 m), weight 45.8 kg, SpO2 99 %.  General: No acute distress Mood and affect are appropriate Heart: Regular rate and rhythm no rubs murmurs or extra sounds Lungs: Clear to auscultation, breathing unlabored, no rales or wheezes Abdomen: Positive bowel sounds, soft nontender to palpation, nondistended Extremities: No clubbing, cyanosis, or edema Skin: No evidence of breakdown, no evidence of rash   Musculoskeletal:        General: No swelling. Normal range of motion.    Cervical back: Normal range of motion. Skin:    General: Skin is warm. Neurological:    Mental Status: He is alert.    Comments: Patient is alert.  With assistance of video interpreter he does follow simple commands.  Provides year in person but decreased awareness and insight as a whole  Patient is dysphonic.  4/5 BUE and BLE   Assessment/Plan: 1. Functional deficits which require 3+ hours per day of interdisciplinary therapy in a comprehensive inpatient rehab setting. Physiatrist is providing close team supervision and 24 hour management of active medical problems listed below. Physiatrist and rehab team continue to assess barriers to discharge/monitor patient progress toward functional and  medical goals  Care Tool:  Bathing    Body parts bathed by patient: Right arm, Left arm, Chest, Abdomen, Front perineal area, Buttocks, Right upper leg, Left upper leg, Face, Left lower leg, Right lower leg         Bathing assist Assist Level: Minimal Assistance - Patient > 75%     Upper Body Dressing/Undressing Upper body dressing   What is the patient wearing?: Pull over shirt    Upper body assist Assist Level: Set up assist    Lower Body Dressing/Undressing Lower body dressing      What is the patient wearing?: Incontinence brief, Pants     Lower body assist Assist for lower body dressing: Minimal Assistance - Patient > 75%     Toileting Toileting    Toileting assist Assist for toileting: Moderate Assistance - Patient 50 - 74%     Transfers Chair/bed transfer  Transfers assist     Chair/bed transfer assist level: Contact Guard/Touching assist Chair/bed transfer assistive device:   Ambulation assist      Assist level: Contact Guard/Touching assist Assistive device: Walker-rolling Max distance: 28ft   Walk 10  feet activity   Assist     Assist level: Contact Guard/Touching assist Assistive device: Walker-rolling   Walk 50 feet activity   Assist    Assist level: Contact Guard/Touching assist Assistive device: Walker-rolling    Walk 150 feet activity   Assist Walk 150 feet activity did not occur: Safety/medical concerns  Assist level: Contact Guard/Touching assist Assistive device: Walker-rolling    Walk 10 feet on uneven surface  activity   Assist     Assist level: Moderate Assistance - Patient - 50 - 74% Assistive device: Other (comment), Hand held assist (railing)   Wheelchair     Assist Will patient use wheelchair at discharge?: No             Wheelchair 50 feet with 2 turns activity    Assist            Wheelchair 150 feet activity     Assist           Blood pressure (!) 91/57, pulse 88, temperature 98.2 F (36.8 C), resp. rate 19, height 5\' 4"  (1.626 m), weight 45.8 kg, SpO2 99 %.  Medical Problem List and Plan: 1.  Debility with altered mental status secondary to CAD/anterior STEMI /cardiogenic shock with occluded proximal LAD status post stenting post subacute left frontal infarct             -patient may shower             -ELOS/Goals: 16-20 days, Mod I with PT/OT, supervision SLP  Continue CIR- PT, OT and SLP 2.  Impaired mobility: continue Lovenox             -antiplatelet therapy: Aspirin 81 mg daily, Brilinta 90 mg twice daily 3. Pain Management: Denies pain. Change Oxycodone to as needed 4. Anxiety: Continue Klonopin 0.5 mg 3 times daily             -antipsychotic agents: N/A 5. Neuropsych: This patient is not capable of making decisions on his own behalf.             -is fall risk, safety plan needed 6. Skin/Wound Care: Routine skin checks 7. Fluids/Electrolytes/Nutrition: Routine in and outs with follow-up chemistries on admit. 8.  Acute hypoxemic respiratory failure.  Patient extubated.  Suspect HCAP completing 7-day course of antibiotic therapy 9.  Right pleural effusion.  Status postthoracentesis 08/01/2020 with 450 cc fluid removal.  Follow-up chest x-ray 08/02/2020 without effusion 10.  Dysphagia.  NPO. NGT feedings currently -f/u MBS, plan per SLP  Now on D1 Nectar diet- eating 100% , drinking well, takes meds in applesauce  11.  Hypertension.  Coreg 3.125 mg twice daily Vitals:   08/21/20 1250 08/21/20 1938  BP: (!) 84/60 (!) 91/57  Pulse: 82 88  Resp: 17 19  Temp: 98.2 F (36.8 C) 98.2 F (36.8 C)  SpO2: 98% 99%  BP still soft monitor for tachy 12.  Hyperlipidemia.  Crestor 13.  New findings diabetes mellitus.  Hemoglobin A1c 6.5.  SSI.  .  Diabetic teaching CBG (last 3)  Recent Labs    08/21/20 1617 08/21/20 2058 08/22/20 0533  GLUCAP 147* 144* 234*   6/22 elevated this am but generally controlled   14.  Tobacco ab use.  NicoDerm patch.  Counseling  15. Vascular congestion/coughing  Improved with albuterol   16.  Cardiomyopathy ej fx 35-40%- cont lanoxin, off coreg , spironolactone due to low BP- no signs of failure  F/u cardiology as OP 16.  Hematuria-  wine (rose') colored urine saved in cup- hemorrhagic cystitis +UA, await Cx start Keflex  LOS: 12 days A FACE TO FACE EVALUATION WAS PERFORMED  Erick Colace 08/22/2020, 7:51 AM

## 2020-08-22 NOTE — Progress Notes (Signed)
Patient ID: Vincent Horton, male   DOB: 12-Aug-1955, 65 y.o.   MRN: [id number]  SW made attempt to call patient son Roseanne Reno), no answer- left VM.  [place], [place] [phone]

## 2020-08-22 NOTE — Progress Notes (Signed)
Physical Therapy Session Note  Patient Details  Name: Vincent Horton MRN: [id number] Date of Birth: 02-12-1956  Today's Date: 08/22/2020 PT Individual Time: 8891-6945 PT Individual Time Calculation (min): 32 min   and  Today's Date: 08/22/2020 PT Missed Time: 28 Minutes Missed Time Reason: Patient fatigue  Short Term Goals: Week 2:  PT Short Term Goal 1 (Week 2): = to LTGs based on ELOS  Skilled Therapeutic Interventions/Progress Updates:    Pt received supine in bed with his son-in-law (SIL) present and agreeable to education/training. With encouragement from family, pt agreeable to limited therapy session. Pt's SIL reports he works from home and will be the primary caregiver during the day. Therapist educated pt/family on the following: - recommendation for 24hr supervision, SIL reports they have purchased pt a "call button" type device to notify family when he needs assistance at home  - recommendation that pt use RW for all standing/ambulating at this time due to increased fall risk - recommendation for follow-up OPPT to continue addressing pt's dynamic standing balance, gait with LRAD, and B LE strength - fall risk education including footwear, night lights, throw rugs, etc.  - pt's impaired safety awareness with poor AD management when turning to sit down in chair or navigating in hospital room  - home entry with pt's family reporting there are 3 STE with B HRs  Pt performed supine>sitting EOB with supervision. Donned shoes set-up assist. Sit<>stands using RW with close supervision for safety and cuing for proper AD management. Gait training ~168ft 2x to/from gym and ~17ft between gyms using RW with close supervision from therapist - educated pt's family on providing close supervision and monitoring pt's safety with RW especially when turning. Educated pt/family on stair navigation sequencing including family assisting with AD management and then assisting pt with navigating stairs - pt  ascended/descended 8 steps using BHRs, per home set-up, with CGA from therapist. Reviewed car transfer sequencing (sedan height to simulate family vehicles) with pt demoing poor safety awareness side stepping in/out of the vehicle - had pt repeat a second time to reinforce safe technique. Educated on recommendation to select ramp over curb navigation if possible but educated pt on how to navigate both using RW - requires CGA for steadying on curb. Pt requesting to return to room due to fatigue. Pt/family report no questions/concerns at this time and family reports feeling confident in providing pt support at home - still planning for additional education tomorrow with other family members. Pt left supine in bed with needs in reach, bed alarm on, and family present. Missed 28 minutes of skilled physical therapy.  Therapy Documentation Precautions:  Precautions Precautions: Fall, Other (comment) Precaution Comments: impulsivity Restrictions Weight Bearing Restrictions: No  Pain: No reports of pain throughout session.    Therapy/Group: Individual Therapy  Ginny Forth , PT, DPT, CSRS 08/22/2020, 12:21 PM

## 2020-08-22 NOTE — Discharge Summary (Signed)
Physician Discharge Summary  Patient ID: Vincent Horton MRN: [id number] DOB/AGE: 66/03/1955 66 y.o.  Admit date: 08/10/2020 Discharge date: 08/24/2020  Discharge Diagnoses:  Principal Problem:   CVA (cerebral vascular accident) [facility](HCC) DVT prophylaxis Acute hypoxemic respiratory failure Right pleural effusion Dysphagia Hyperlipidemia New findings diabetes mellitus Tobacco abuse Cardiomyopathy UTI  Discharged Condition: Stable  Significant Diagnostic Studies: MR BRAIN WO CONTRAST  Result Date: 08/07/2020 CLINICAL DATA:  Persistent altered mental status post cardiac arrest EXAM: MRI HEAD WITHOUT CONTRAST TECHNIQUE: Multiplanar, multiecho pulse sequences of the brain and surrounding structures were obtained without intravenous contrast. COMPARISON:  None. FINDINGS: Brain: Punctate focus of reduced diffusion in the left frontal lobe. Prominence of the ventricles and sulci reflects minor generalized parenchymal volume loss. Few small foci of T2 hyperintensity in the supratentorial white matter are nonspecific but may reflect minor chronic microvascular ischemic changes. There is a chronic small vessel infarct of the posterior left putamen. There is no evidence of intracranial hemorrhage. There is no intracranial mass or mass effect. There is no hydrocephalus or extra-axial fluid collection. Vascular: Major vessel flow voids at the skull base are preserved. Skull and upper cervical spine: Normal marrow signal is preserved. Sinuses/Orbits: Mild mucosal thickening.  Right lens replacement. Other: Sella is unremarkable.  Patchy mastoid fluid opacification. 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 No evidence of hypoxic/ischemic injury. Electronically Signed   By: Guadlupe SpanishPraneil  Patel M.D.   On: 08/07/2020 18:57   DG Chest Port 1 View  Result Date: 08/14/2020 CLINICAL DATA:  Productive cough EXAM: PORTABLE CHEST 1 VIEW COMPARISON:  08/11/2020, 08/02/2020 FINDINGS: Esophageal tube tip extends  below diaphragm but is incompletely visualized. Improved aeration of left base with better visualization of diaphragm. Minimal hazy infrahilar opacities. Normal cardiomediastinal silhouette. No pneumothorax. IMPRESSION: Slightly improved aeration at left base. There are mild residual infrahilar hazy opacities which may be due to atelectasis, mild residual edema, or pneumonia. Electronically Signed   By: Jasmine PangKim  Fujinaga M.D.   On: 08/14/2020 15:11   DG CHEST PORT 1 VIEW  Result Date: 08/11/2020 CLINICAL DATA:  Coughing.  CVA. EXAM: PORTABLE CHEST 1 VIEW COMPARISON:  08/02/2020 FINDINGS: Lung apices are cut off the film. Enteric tube courses through the stomach and off the film as tip is not visualized. Lungs are adequately inflated with subtle hazy prominence of the infrahilar markings. No lobar consolidation or effusion. Coronary stent unchanged. Cardiomediastinal silhouette and remainder of the exam is unchanged. IMPRESSION: Suggestion of minimal vascular congestion. Electronically Signed   By: Elberta Fortisaniel  Boyle M.D.   On: 08/11/2020 10:38   DG Chest Port 1 View  Result Date: 08/02/2020 CLINICAL DATA:  65 year old male with respiratory failure. Hypoxia. STEMI. Found down. EXAM: PORTABLE CHEST 1 VIEW COMPARISON:  Portable chest 08/01/2020 and earlier. FINDINGS: Portable AP supine view at 0426 hours. Patient more rotated to the left today. Stable visible enteric feeding tube, looped once in the proximal stomach. Stable right IJ central line. Stable cardiac size and mediastinal contours. No pneumothorax. No pleural effusion is evident. Continued widespread increased interstitial opacity, with additional patchy and platelike opacity at the lung bases. Ventilation has improved since 07/31/2020, stable since yesterday. IMPRESSION: 1. Stable lines and tubes. 2. Stable ventilation from yesterday with widespread interstitial opacity, improved since 07/31/2020, and bibasilar atelectasis. Electronically Signed   By: Odessa FlemingH  Hall  M.D.   On: 08/02/2020 06:41   DG CHEST PORT 1 VIEW  Result Date: 08/01/2020 CLINICAL DATA:  Status post thoracentesis. EXAM: PORTABLE CHEST 1 VIEW  COMPARISON:  Single-view of the chest earlier today. FINDINGS: Hazy opacity over the right chest is decreased after thoracentesis. No pneumothorax. Streaky basilar airspace opacities are again seen. Right IJ catheter is unchanged. The patient's feeding tube tip has advanced and is now below the inferior margin the film. IMPRESSION: Decreased right effusion after thoracentesis. Negative for pneumothorax. Bibasilar airspace disease could be due to atelectasis or pneumonia. Feeding tube has advanced with the tip now below the inferior margin of the film. Electronically Signed   By: Drusilla Kanner M.D.   On: 08/01/2020 15:01   DG CHEST PORT 1 VIEW  Result Date: 08/01/2020 CLINICAL DATA:  Hypoxia. EXAM: PORTABLE CHEST 1 VIEW COMPARISON:  07/31/2020. FINDINGS: Interim extubation. Feeding tube noted with tip coiled in stomach. Right IJ line noted with tip over the upper right atrium. Heart size normal. Diffuse bilateral pulmonary infiltrates/edema, progressed from prior exam. Bibasilar atelectasis. Small right pleural effusion. No pneumothorax. IMPRESSION: 1. Interim extubation. Feeding tube noted with tip coiled in stomach. Right IJ line noted with tip over the upper right atrium. 2. Diffuse bilateral pulmonary infiltrates/edema, progressed from prior exam. Bibasilar atelectasis. Small right pleural effusion. Electronically Signed   By: Maisie Fus  Register   On: 08/01/2020 06:52   DG Chest Port 1 View  Result Date: 07/31/2020 CLINICAL DATA:  Intubation. EXAM: PORTABLE CHEST 1 VIEW COMPARISON:  07/26/2020. FINDINGS: Endotracheal tube, feeding tube, right IJ catheter in stable position. Heart size stable. Bibasilar pulmonary infiltrates/edema again noted. Slight improvement on the left. Persistent bibasilar atelectasis. Persistent mild to moderate right pleural effusion.  No pneumothorax. IMPRESSION: 1.  Lines and tubes in stable position. 2. Persistent bibasilar infiltrates/edema again noted. Slight improvement on the left. Persistent bibasilar atelectasis. Persistent mild to moderate right pleural effusion. Electronically Signed   By: Maisie Fus  Register   On: 07/31/2020 05:18   DG CHEST PORT 1 VIEW  Result Date: 07/26/2020 CLINICAL DATA:  Congestive heart failure. EXAM: PORTABLE CHEST 1 VIEW COMPARISON:  Jul 23, 2020. FINDINGS: The heart size and mediastinal contours are within normal limits. Endotracheal and feeding tubes are in good position. Right internal jugular catheter is unchanged. No pneumothorax is noted. Stable bibasilar atelectasis or edema is noted with associated pleural effusions. The visualized skeletal structures are unremarkable. IMPRESSION: Stable support apparatus. Stable bibasilar opacities as described above. Electronically Signed   By: Lupita Raider M.D.   On: 07/26/2020 08:19   DG Abd Portable 1V  Result Date: 08/15/2020 CLINICAL DATA:  65 year old male feeding tube placement. EXAM: PORTABLE ABDOMEN - 1 VIEW COMPARISON:  08/08/2020 and earlier. FINDINGS: Portable AP view at 1019 hours. Enteric tube position not significantly changed, tip at the gastric antrum or duodenal bulb level to the right of the lumbar spine. Visible bowel-gas pattern remains within normal limits. Stable lung bases. No acute osseous abnormality identified. IMPRESSION: Enteric tube tip at the gastric antrum or duodenal bulb, could be advanced for further post pyloric placement. Electronically Signed   By: Odessa Fleming M.D.   On: 08/15/2020 10:32   DG Abd Portable 1V  Result Date: 08/08/2020 CLINICAL DATA:  Feeding tube adjustment. EXAM: PORTABLE ABDOMEN - 1 VIEW COMPARISON:  None. FINDINGS: Tip of the weighted enteric tube in the right upper quadrant in the region of the distal stomach. No bowel dilatation to suggest obstruction. There is contrast in the colon from prior  swallowing function. IMPRESSION: Tip of the weighted enteric tube in the right upper quadrant in the region of the distal stomach. Electronically  Signed   By: Narda Rutherford M.D.   On: 08/08/2020 16:23   DG Swallowing Func-Speech Pathology  Result Date: 08/22/2020 Formatting of this result is different from the original. Objective Swallowing Evaluation: Type of Study: MBS-Modified Barium Swallow Study  Patient Details Name: Vincent Horton MRN: [id number] Date of Birth: Jul 26, 1955 Today's Date: 08/22/2020 Time: SLP Start Time (ACUTE ONLY): 1455 -SLP Stop Time (ACUTE ONLY): 1525 SLP Time Calculation (min) (ACUTE ONLY): 30 min Past Medical History: Past Medical History: Diagnosis Date  Cardiac arrest (HCC)   Coronary artery disease   Dysrhythmia   Myocardial infarction [facility])  Past Surgical History: Past Surgical History: Procedure Laterality Date  CARDIAC CATHETERIZATION    CORONARY/GRAFT ACUTE MI REVASCULARIZATION N/A 07/21/2020  Procedure: Coronary/Graft Acute MI Revascularization;  Surgeon: Swaziland, Peter M, MD;  Location: [facility] INVASIVE CV LAB;  Service: Cardiovascular;  Laterality: N/A;  LEFT HEART CATH AND CORONARY ANGIOGRAPHY N/A 07/21/2020  Procedure: LEFT HEART CATH AND CORONARY ANGIOGRAPHY;  Surgeon: Swaziland, Peter M, MD;  Location: [facility] INVASIVE CV LAB;  Service: Cardiovascular;  Laterality: N/A;  RIGHT HEART CATH N/A 07/21/2020  Procedure: RIGHT HEART CATH;  Surgeon: Swaziland, Peter M, MD;  Location: [facility] INVASIVE CV LAB;  Service: Cardiovascular;  Laterality: N/A; HPI: Pt is a 65 yo male found down by family VF arrest associated with STEMI, shocked 5x, 15 min CPR before ROSC achieved. ETT 5/21-5/31. CXR 5/31 with persistent bibasilar infiltrates/edema. PMH includes cardiac arrest, CAD, dysrhythmia, myocardial infarction. Per pt's son, pt's oral intake at home was limited to very soft and/or liquidised foods PTA due to condition of dentition.  Subjective: pt alert and cooperative, needing repetition of instructions  Assessment / Plan / Recommendation CHL IP CLINICAL IMPRESSIONS 08/22/2020 Clinical Impression Patient presents with a significant improvement of swallow function as compared to most recent MBS on 6/17. Of note, Cortrak feeding tube no longer present. Patient continues to exhibit delays in mastication (only has some of front top and bottom teeth) as well as delays in anterior to posterior transit of solids and liquids leading to some premature spillage of liquids prior to swallow initiation. No penetration or aspiraiton occured with cup sips of nectar thick liquids. Thin liquids resulted in trace silent peneration during the swallow (PAS 2) but aspiration. Trace vallecular residuals remained post initial swallow with solids and liquids, but cleared with subsequent swallows. With regular and puree solids, trace to min amount of residual observed just aboe UES opening, but with eventual clearance. Esophageal sweep performed while patient consuming puree solids and barium tablet (he had masticated) did not reveal anything significant (no radiologist to confirm or deny). SLP is recommending that patient initiate upgraded, Dys 2 solids, thin liquids. SLP Visit Diagnosis Dysphagia, oropharyngeal phase (R13.12) Attention and concentration deficit following -- Frontal lobe and executive function deficit following -- Impact on safety and function Mild aspiration risk   CHL IP TREATMENT RECOMMENDATION 08/06/2020 Treatment Recommendations Therapy as outlined in treatment plan below   Prognosis 08/06/2020 Prognosis for Safe Diet Advancement Good Barriers to Reach Goals Cognitive deficits;Severity of deficits Barriers/Prognosis Comment -- CHL IP DIET RECOMMENDATION 08/22/2020 SLP Diet Recommendations Dysphagia 2 (Fine chop) solids;Thin liquid Liquid Administration via Cup;Straw Medication Administration Crushed with puree Compensations Minimize environmental distractions;Slow rate;Small sips/bites;Follow solids with liquid Postural  Changes Seated upright at 90 degrees   CHL IP OTHER RECOMMENDATIONS 08/22/2020 Recommended Consults -- Oral Care Recommendations Oral care BID;Staff/trained caregiver to provide oral care Other Recommendations --   CHL IP FOLLOW UP RECOMMENDATIONS 08/08/2020  Follow up Recommendations Inpatient Rehab   CHL IP FREQUENCY AND DURATION 08/08/2020 Speech Therapy Frequency (ACUTE ONLY) min 2x/week Treatment Duration --      CHL IP ORAL PHASE 08/22/2020 Oral Phase Impaired Oral - Pudding Teaspoon -- Oral - Pudding Cup -- Oral - Honey Teaspoon -- Oral - Honey Cup -- Oral - Nectar Teaspoon NT Oral - Nectar Cup Premature spillage;Reduced posterior propulsion;Weak lingual manipulation Oral - Nectar Straw -- Oral - Thin Teaspoon Weak lingual manipulation;Reduced posterior propulsion;Delayed oral transit Oral - Thin Cup Weak lingual manipulation;Delayed oral transit;Premature spillage Oral - Thin Straw Premature spillage;Weak lingual manipulation;Delayed oral transit Oral - Puree Delayed oral transit;Weak lingual manipulation Oral - Mech Soft -- Oral - Regular Weak lingual manipulation;Delayed oral transit;Reduced posterior propulsion;Impaired mastication Oral - Multi-Consistency -- Oral - Pill Reduced posterior propulsion;Delayed oral transit;Decreased bolus cohesion;Weak lingual manipulation Oral Phase - Comment --  CHL IP PHARYNGEAL PHASE 08/22/2020 Pharyngeal Phase Impaired Pharyngeal- Pudding Teaspoon -- Pharyngeal -- Pharyngeal- Pudding Cup -- Pharyngeal -- Pharyngeal- Honey Teaspoon -- Pharyngeal -- Pharyngeal- Honey Cup -- Pharyngeal -- Pharyngeal- Nectar Teaspoon NT Pharyngeal -- Pharyngeal- Nectar Cup Delayed swallow initiation-vallecula;Pharyngeal residue - valleculae Pharyngeal -- Pharyngeal- Nectar Straw -- Pharyngeal -- Pharyngeal- Thin Teaspoon Delayed swallow initiation-vallecula;Pharyngeal residue - valleculae;Pharyngeal residue - pyriform Pharyngeal Material does not enter airway Pharyngeal- Thin Cup Delayed swallow  initiation-vallecula;Penetration/Aspiration during swallow;Reduced airway/laryngeal closure Pharyngeal Material enters airway, remains ABOVE vocal cords then ejected out Pharyngeal- Thin Straw Delayed swallow initiation-vallecula;Pharyngeal residue - valleculae;Penetration/Aspiration during swallow;Reduced airway/laryngeal closure Pharyngeal Material enters airway, remains ABOVE vocal cords then ejected out Pharyngeal- Puree Pharyngeal residue - valleculae;Pharyngeal residue - pyriform Pharyngeal Material does not enter airway Pharyngeal- Mechanical Soft -- Pharyngeal -- Pharyngeal- Regular Delayed swallow initiation-vallecula Pharyngeal -- Pharyngeal- Multi-consistency -- Pharyngeal -- Pharyngeal- Pill WFL Pharyngeal -- Pharyngeal Comment --  CHL IP CERVICAL ESOPHAGEAL PHASE 08/22/2020 Cervical Esophageal Phase Impaired Pudding Teaspoon -- Pudding Cup -- Honey Teaspoon -- Honey Cup -- Nectar Teaspoon -- Nectar Cup -- Nectar Straw -- Thin Teaspoon -- Thin Cup -- Thin Straw -- Puree -- Mechanical Soft -- Regular -- Multi-consistency -- Pill -- Cervical Esophageal Comment -- Vincent Nevin, MA, CCC-SLP Speech Therapy             DG Swallowing Func-Speech Pathology  Result Date: 08/17/2020 Formatting of this result is different from the original. Objective Swallowing Evaluation: Type of Study: MBS-Modified Barium Swallow Study  Patient Details Name: Vincent Horton MRN: [id number] Date of Birth: Feb 22, 1956 Today's Date: 08/17/2020 Time: SLP Start Time (ACUTE ONLY): 1455 -SLP Stop Time (ACUTE ONLY): 1525 SLP Time Calculation (min) (ACUTE ONLY): 30 min Past Medical History: Past Medical History: Diagnosis Date  Cardiac arrest (HCC)   Coronary artery disease   Dysrhythmia   Myocardial infarction [facility])  Past Surgical History: Past Surgical History: Procedure Laterality Date  CARDIAC CATHETERIZATION    CORONARY/GRAFT ACUTE MI REVASCULARIZATION N/A 07/21/2020  Procedure: Coronary/Graft Acute MI Revascularization;  Surgeon: Swaziland,  Peter M, MD;  Location: [facility] INVASIVE CV LAB;  Service: Cardiovascular;  Laterality: N/A;  LEFT HEART CATH AND CORONARY ANGIOGRAPHY N/A 07/21/2020  Procedure: LEFT HEART CATH AND CORONARY ANGIOGRAPHY;  Surgeon: Swaziland, Peter M, MD;  Location[facility] INVASIVE CV LAB;  Service: Cardiovascular;  Laterality: N/A;  RIGHT HEART CATH N/A 07/21/2020  Procedure: RIGHT HEART CATH;  Surgeon: Swaziland, Peter M, MD;  Location: [facility] INVASIVE CV LAB;  Service: Cardiovascular;  Laterality: N/A; HPI: Pt is a 65 yo male found down by family VF arrest  associated with STEMI, shocked 5x, 15 min CPR before ROSC achieved. ETT 5/21-5/31. CXR 5/31 with persistent bibasilar infiltrates/edema. PMH includes cardiac arrest, CAD, dysrhythmia, myocardial infarction. Per pt's son, pt's oral intake at home was limited to very soft and/or liquidised foods PTA due to condition of dentition.  Subjective: pt alert and cooperative, needing repetition of instructions Assessment / Plan / Recommendation CHL IP CLINICAL IMPRESSIONS 08/17/2020 Clinical Impression Patient presents with a significant improvement in his swallow function as compared to previous MBS on 6/6. During today's MBS, he demonstrates a moderate oropharyngeal dysphagia. Patient exhibited delays in oral transit with all consistencies, but puree solids resulted in most significant delay. Thin liquids via cup sips and straw sips both led to swallow initiation delay to level of pyriform sinus, silent aspiration during as well as after the swallow from penetrate that had not cleared laryngeal vestibule. Aspiration events were sensed when liquid had already passed through the vocal cords and although he was able to cough and clear portion of aspirate, was not able to fully clear. Instances of silent aspiration did occur as well, which mainly were observed when aspiration was occuring after the swallow from penetrate. No penetration or aspiration was observed with nectar thick liquids via spoon sip or cup  sip but delay in pharyngeal transit observed as well as vallecular and posterior pharyngeal wall residuals. Subsequent swallows and alternating liquids and puree solids did aid in reducing amount of pharyngeal residuals. SLP is recommending to initiate Dys 1 solids, nectar thick liquids diet at this time. SLP Visit Diagnosis Dysphagia, oropharyngeal phase (R13.12) Attention and concentration deficit following -- Frontal lobe and executive function deficit following -- Impact on safety and function Mild aspiration risk;Moderate aspiration risk   CHL IP TREATMENT RECOMMENDATION 08/06/2020 Treatment Recommendations Therapy as outlined in treatment plan below   Prognosis 08/06/2020 Prognosis for Safe Diet Advancement Good Barriers to Reach Goals Cognitive deficits;Severity of deficits Barriers/Prognosis Comment -- CHL IP DIET RECOMMENDATION 08/17/2020 SLP Diet Recommendations Dysphagia 1 (Puree) solids;Nectar thick liquid Liquid Administration via Cup Medication Administration Crushed with puree Compensations Slow rate;Small sips/bites;Minimize environmental distractions;Follow solids with liquid Postural Changes Seated upright at 90 degrees   CHL IP OTHER RECOMMENDATIONS 08/17/2020 Recommended Consults -- Oral Care Recommendations Oral care BID;Staff/trained caregiver to provide oral care;Oral care before and after PO Other Recommendations Have oral suction available   CHL IP FOLLOW UP RECOMMENDATIONS 08/08/2020 Follow up Recommendations Inpatient Rehab   CHL IP FREQUENCY AND DURATION 08/08/2020 Speech Therapy Frequency (ACUTE ONLY) min 2x/week Treatment Duration --      CHL IP ORAL PHASE 08/17/2020 Oral Phase Impaired Oral - Pudding Teaspoon -- Oral - Pudding Cup -- Oral - Honey Teaspoon -- Oral - Honey Cup -- Oral - Nectar Teaspoon NT Oral - Nectar Cup Reduced posterior propulsion;Delayed oral transit;Weak lingual manipulation Oral - Nectar Straw -- Oral - Thin Teaspoon Weak lingual manipulation;Reduced posterior  propulsion;Delayed oral transit Oral - Thin Cup Weak lingual manipulation;Reduced posterior propulsion;Delayed oral transit Oral - Thin Straw Weak lingual manipulation;Reduced posterior propulsion;Delayed oral transit Oral - Puree Delayed oral transit;Reduced posterior propulsion;Weak lingual manipulation Oral - Mech Soft -- Oral - Regular -- Oral - Multi-Consistency -- Oral - Pill -- Oral Phase - Comment --  CHL IP PHARYNGEAL PHASE 08/17/2020 Pharyngeal Phase Impaired Pharyngeal- Pudding Teaspoon -- Pharyngeal -- Pharyngeal- Pudding Cup -- Pharyngeal -- Pharyngeal- Honey Teaspoon -- Pharyngeal -- Pharyngeal- Honey Cup -- Pharyngeal -- Pharyngeal- Nectar Teaspoon Pharyngeal residue - valleculae;Pharyngeal residue - posterior pharnyx;Delayed swallow  initiation-vallecula;Reduced pharyngeal peristalsis Pharyngeal Material does not enter airway Pharyngeal- Nectar Cup Delayed swallow initiation-vallecula Pharyngeal -- Pharyngeal- Nectar Straw -- Pharyngeal -- Pharyngeal- Thin Teaspoon Delayed swallow initiation-vallecula;Delayed swallow initiation-pyriform sinuses;Reduced airway/laryngeal closure;Penetration/Apiration after swallow;Pharyngeal residue - valleculae;Pharyngeal residue - posterior pharnyx Pharyngeal Material enters airway, remains ABOVE vocal cords and not ejected out Pharyngeal- Thin Cup Delayed swallow initiation-pyriform sinuses Pharyngeal -- Pharyngeal- Thin Straw Delayed swallow initiation-pyriform sinuses;Reduced airway/laryngeal closure;Penetration/Apiration after swallow;Penetration/Aspiration during swallow;Pharyngeal residue - posterior pharnyx;Pharyngeal residue - valleculae Pharyngeal Material enters airway, passes BELOW cords and not ejected out despite cough attempt by patient;Material enters airway, passes BELOW cords without attempt by patient to eject out (silent aspiration);Material enters airway, remains ABOVE vocal cords and not ejected out Pharyngeal- Puree Reduced pharyngeal  peristalsis;Pharyngeal residue - valleculae;Pharyngeal residue - posterior pharnyx;Pharyngeal residue - pyriform Pharyngeal Material does not enter airway Pharyngeal- Mechanical Soft -- Pharyngeal -- Pharyngeal- Regular -- Pharyngeal -- Pharyngeal- Multi-consistency -- Pharyngeal -- Pharyngeal- Pill -- Pharyngeal -- Pharyngeal Comment --  CHL IP CERVICAL ESOPHAGEAL PHASE 08/17/2020 Cervical Esophageal Phase WFL Pudding Teaspoon -- Pudding Cup -- Honey Teaspoon -- Honey Cup -- Nectar Teaspoon -- Nectar Cup -- Nectar Straw -- Thin Teaspoon -- Thin Cup -- Thin Straw -- Puree -- Mechanical Soft -- Regular -- Multi-consistency -- Pill -- Cervical Esophageal Comment -- Vincent Nevin, MA, CCC-SLP Speech Therapy             DG Swallowing Func-Speech Pathology  Result Date: 08/06/2020 Objective Swallowing Evaluation: Type of Study: MBS-Modified Barium Swallow Study  Patient Details Name: Vincent Horton MRN: [id number] Date of Birth: 12-30-1955 Today's Date: 08/06/2020 Time: SLP Start Time (ACUTE ONLY): 1303 -SLP Stop Time (ACUTE ONLY): 1325 SLP Time Calculation (min) (ACUTE ONLY): 22 min Past Medical History: Past Medical History: Diagnosis Date  Cardiac arrest (HCC)   Coronary artery disease   Dysrhythmia   Myocardial infarction [facility])  Past Surgical History: Past Surgical History: Procedure Laterality Date  CARDIAC CATHETERIZATION    CORONARY/GRAFT ACUTE MI REVASCULARIZATION N/A 07/21/2020  Procedure: Coronary/Graft Acute MI Revascularization;  Surgeon: Swaziland, Peter M, MD;  Location: [facility] INVASIVE CV LAB;  Service: Cardiovascular;  Laterality: N/A;  LEFT HEART CATH AND CORONARY ANGIOGRAPHY N/A 07/21/2020  Procedure: LEFT HEART CATH AND CORONARY ANGIOGRAPHY;  Surgeon: Swaziland, Peter M, MD;  Location: [facility] INVASIVE CV LAB;  Service: Cardiovascular;  Laterality: N/A;  RIGHT HEART CATH N/A 07/21/2020  Procedure: RIGHT HEART CATH;  Surgeon: Swaziland, Peter M, MD;  Location: [facility] INVASIVE CV LAB;  Service: Cardiovascular;  Laterality: N/A; HPI:  Pt is a 65 yo male found down by family VF arrest associated with STEMI, shocked 5x, 15 min CPR before ROSC achieved. ETT 5/21-5/31. CXR 5/31 with persistent bibasilar infiltrates/edema. PMH includes cardiac arrest, CAD, dysrhythmia, myocardial infarction. Per pt's son, pt's oral intake at home was limited to very soft and/or liquidised foods PTA due to condition of dentition.  Subjective: pt alert and cooperative, needing repetition of instructions Assessment / Plan / Recommendation CHL IP CLINICAL IMPRESSIONS 08/06/2020 Clinical Impression Pt has a severe oropharyngeal dysphagia that is likely multifactorial in the setting of decreased strength and sensation s/p intubation and overall deconditioning, further impacted by reduced oral hygiene/presence of secretions and cognitive impairment. Video interpreter Linna Caprice [id number]) was used throughout this study. Pt has weak lingual manipulation with slow, repetitive movements used to work boluses back toward his pharynx but incompletely clearing his oral cavity. Pharyngeally he has reduced base of tongue retraction, hyolaryngeal movement, pharyngeal squeeze, and epiglottic inversion,  leading to impaired airway closure and reduced pharyngeal clearance. Thin and nectar thick liquids are silently aspirated during the swallow, and all consistencies tested, including purees, are silently aspirated after the swallow due to pharyngeal residue that also appears to mix with secretions. Yankauer was used to clear his oral cavity as much as possible. He cannot produce enough strength to clear residuals despite cues for multiple hard swallows or chin tuck. He also cannot produce a cough hard enough to clear any aspirates or anything that remained in his laryngeal vestibule. Recommend that he continue to remain NPO with strong focus on oral care. Given mentation and severity of deficits, pt's recovery may take a longer amount of time, and he therefore may benefit from longer-term  alternative means of nutrition if nearing readiness for discharge otherwise. SLP Visit Diagnosis Dysphagia, oropharyngeal phase (R13.12) Attention and concentration deficit following -- Frontal lobe and executive function deficit following -- Impact on safety and function Severe aspiration risk;Risk for inadequate nutrition/hydration   CHL IP TREATMENT RECOMMENDATION 08/06/2020 Treatment Recommendations Therapy as outlined in treatment plan below   Prognosis 08/06/2020 Prognosis for Safe Diet Advancement Good Barriers to Reach Goals Cognitive deficits;Severity of deficits Barriers/Prognosis Comment -- CHL IP DIET RECOMMENDATION 08/06/2020 SLP Diet Recommendations NPO;Alternative means - temporary Liquid Administration via -- Medication Administration Via alternative means Compensations -- Postural Changes --   CHL IP OTHER RECOMMENDATIONS 08/06/2020 Recommended Consults -- Oral Care Recommendations Oral care QID Other Recommendations Have oral suction available   CHL IP FOLLOW UP RECOMMENDATIONS 08/06/2020 Follow up Recommendations Inpatient Rehab   CHL IP FREQUENCY AND DURATION 08/06/2020 Speech Therapy Frequency (ACUTE ONLY) min 2x/week Treatment Duration 2 weeks      CHL IP ORAL PHASE 08/06/2020 Oral Phase Impaired Oral - Pudding Teaspoon -- Oral - Pudding Cup -- Oral - Honey Teaspoon -- Oral - Honey Cup -- Oral - Nectar Teaspoon Weak lingual manipulation;Reduced posterior propulsion;Lingual/palatal residue;Delayed oral transit;Decreased bolus cohesion Oral - Nectar Cup -- Oral - Nectar Straw -- Oral - Thin Teaspoon Weak lingual manipulation;Reduced posterior propulsion;Lingual/palatal residue;Delayed oral transit;Decreased bolus cohesion Oral - Thin Cup -- Oral - Thin Straw Weak lingual manipulation;Reduced posterior propulsion;Lingual/palatal residue;Delayed oral transit;Decreased bolus cohesion Oral - Puree Weak lingual manipulation;Reduced posterior propulsion;Lingual/palatal residue;Delayed oral transit;Decreased bolus  cohesion Oral - Mech Soft -- Oral - Regular -- Oral - Multi-Consistency -- Oral - Pill -- Oral Phase - Comment --  CHL IP PHARYNGEAL PHASE 08/06/2020 Pharyngeal Phase Impaired Pharyngeal- Pudding Teaspoon -- Pharyngeal -- Pharyngeal- Pudding Cup -- Pharyngeal -- Pharyngeal- Honey Teaspoon -- Pharyngeal -- Pharyngeal- Honey Cup -- Pharyngeal -- Pharyngeal- Nectar Teaspoon Reduced pharyngeal peristalsis;Reduced epiglottic inversion;Reduced anterior laryngeal mobility;Reduced laryngeal elevation;Reduced airway/laryngeal closure;Reduced tongue base retraction;Pharyngeal residue - valleculae;Penetration/Aspiration during swallow;Penetration/Apiration after swallow Pharyngeal Material enters airway, passes BELOW cords without attempt by patient to eject out (silent aspiration) Pharyngeal- Nectar Cup -- Pharyngeal -- Pharyngeal- Nectar Straw -- Pharyngeal -- Pharyngeal- Thin Teaspoon Reduced pharyngeal peristalsis;Reduced epiglottic inversion;Reduced anterior laryngeal mobility;Reduced laryngeal elevation;Reduced airway/laryngeal closure;Reduced tongue base retraction;Pharyngeal residue - valleculae;Penetration/Aspiration during swallow;Penetration/Apiration after swallow Pharyngeal Material enters airway, passes BELOW cords without attempt by patient to eject out (silent aspiration) Pharyngeal- Thin Cup -- Pharyngeal -- Pharyngeal- Thin Straw Reduced pharyngeal peristalsis;Reduced epiglottic inversion;Reduced anterior laryngeal mobility;Reduced laryngeal elevation;Reduced airway/laryngeal closure;Reduced tongue base retraction;Pharyngeal residue - valleculae;Penetration/Aspiration during swallow;Penetration/Apiration after swallow Pharyngeal Material enters airway, passes BELOW cords without attempt by patient to eject out (silent aspiration) Pharyngeal- Puree Reduced pharyngeal peristalsis;Reduced epiglottic inversion;Reduced anterior laryngeal mobility;Reduced laryngeal elevation;Reduced airway/laryngeal  closure;Reduced  tongue base retraction;Pharyngeal residue - valleculae;Penetration/Apiration after swallow Pharyngeal Material enters airway, passes BELOW cords without attempt by patient to eject out (silent aspiration) Pharyngeal- Mechanical Soft -- Pharyngeal -- Pharyngeal- Regular -- Pharyngeal -- Pharyngeal- Multi-consistency -- Pharyngeal -- Pharyngeal- Pill -- Pharyngeal -- Pharyngeal Comment --  CHL IP CERVICAL ESOPHAGEAL PHASE 08/06/2020 Cervical Esophageal Phase WFL Pudding Teaspoon -- Pudding Cup -- Honey Teaspoon -- Honey Cup -- Nectar Teaspoon -- Nectar Cup -- Nectar Straw -- Thin Teaspoon -- Thin Cup -- Thin Straw -- Puree -- Mechanical Soft -- Regular -- Multi-consistency -- Pill -- Cervical Esophageal Comment -- Vincent Menghini., M.A. CCC-SLP Acute Rehabilitation Services Pager [phone] Office [phone] 08/06/2020, 4:05 PM              ECHOCARDIOGRAM COMPLETE  Result Date: 08/10/2020    ECHOCARDIOGRAM REPORT   Patient Name:   Vincent Horton Date of Exam: 08/10/2020 Medical Rec #:  [id number]  Height:       64.0 in Accession #:    [id number] Weight:       99.9 lb Date of Birth:  [birth year]   BSA:          1.456 m Patient Age:    65 years   BP:           95/72 mmHg Patient Gender: M          HR:           86 bpm. Exam Location:  Inpatient Procedure: 2D Echo, 3D Echo, Intracardiac Opacification Agent, Color Doppler and            Cardiac Doppler Indications:    Stroke.  History:        Patient has prior history of Echocardiogram examinations, most                 recent 07/21/2020. Cardiomyopathy, Previous Myocardial Infarction                 and CAD, Arrythmias:Cardiac Arrest; Signs/Symptoms:Dyspnea.                 Cardiac shock.  Sonographer:    Sheralyn Boatman RDCS Referring Phys: [phone] [facility]  Sonographer Comments: Technically difficult study due to poor echo windows. IMPRESSIONS  1. Left ventricular ejection fraction, by estimation, is 35 to 40%. The left ventricle has moderately decreased function. The left  ventricle demonstrates regional wall motion abnormalities (see scoring diagram/findings for description). There is mild concentric left ventricular hypertrophy. Left ventricular diastolic parameters are indeterminate. There is akinesis of the left ventricular, apical segment. There is akinesis of the left ventricular, apical septal wall and anterior wall. There is akinesis of the left ventricular, apical segment. No obvious LV thrombus is present but there is swirling of definity contrast in the apex consistent with sluggish flow. Cannot rule out early forming apical thrombus.  2. Right ventricular systolic function is normal. The right ventricular size is normal. There is normal pulmonary artery systolic pressure.  3. The mitral valve is normal in structure. No evidence of mitral valve regurgitation. No evidence of mitral stenosis.  4. The aortic valve is tricuspid. Aortic valve regurgitation is not visualized. Mild to moderate aortic valve sclerosis/calcification is present, without any evidence of aortic stenosis.  5. The inferior vena cava is normal in size with greater than 50% respiratory variability, suggesting right atrial pressure of 3 mmHg. FINDINGS  Left Ventricle: No obvious LV thrombus is present but there is swirling of definity  contrast in the apex consistent with sluggish flow. Cannot rule out early forming apical thrombus. Left ventricular ejection fraction, by estimation, is 35 to 40%. The left ventricle has moderately decreased function. The left ventricle demonstrates regional wall motion abnormalities. Definity contrast agent was given IV to delineate the left ventricular endocardial borders. The left ventricular internal cavity size was normal in size. There is mild concentric left ventricular hypertrophy. Left ventricular diastolic parameters are indeterminate. Right Ventricle: The right ventricular size is normal. No increase in right ventricular wall thickness. Right ventricular systolic  function is normal. There is normal pulmonary artery systolic pressure. The tricuspid regurgitant velocity is 2.38 m/s, and  with an assumed right atrial pressure of 3 mmHg, the estimated right ventricular systolic pressure is 25.7 mmHg. Left Atrium: Left atrial size was normal in size. Right Atrium: Right atrial size was normal in size. Pericardium: There is no evidence of pericardial effusion. Mitral Valve: The mitral valve is normal in structure. No evidence of mitral valve regurgitation. No evidence of mitral valve stenosis. Tricuspid Valve: The tricuspid valve is normal in structure. Tricuspid valve regurgitation is mild . No evidence of tricuspid stenosis. Aortic Valve: The aortic valve is tricuspid. Aortic valve regurgitation is not visualized. Mild to moderate aortic valve sclerosis/calcification is present, without any evidence of aortic stenosis. Pulmonic Valve: The pulmonic valve was normal in structure. Pulmonic valve regurgitation is not visualized. No evidence of pulmonic stenosis. Aorta: The aortic root is normal in size and structure. Venous: The inferior vena cava is normal in size with greater than 50% respiratory variability, suggesting right atrial pressure of 3 mmHg. IAS/Shunts: No atrial level shunt detected by color flow Doppler.  LEFT VENTRICLE PLAX 2D LVIDd:         3.30 cm     Diastology LVIDs:         2.50 cm     LV e' medial:    7.07 cm/s LV IVS:        1.20 cm     LV E/e' medial:  8.9 LVOT diam:     2.10 cm     LV e' lateral:   6.74 cm/s LV SV:         48          LV E/e' lateral: 9.4 LV SV Index:   33 LVOT Area:     3.46 cm  LV Volumes (MOD) LV vol d, MOD A2C: 56.9 ml LV vol d, MOD A4C: 47.8 ml LV vol s, MOD A2C: 34.9 ml LV vol s, MOD A4C: 27.4 ml LV SV MOD A2C:     22.0 ml LV SV MOD A4C:     47.8 ml LV SV MOD BP:      20.1 ml RIGHT VENTRICLE             IVC RV S prime:     12.60 cm/s  IVC diam: 1.80 cm TAPSE (M-mode): 2.0 cm LEFT ATRIUM             Index      RIGHT ATRIUM           Index LA diam:        2.70 cm 1.85 cm/m RA Area:     7.73 cm LA Vol (A2C):   4.2 ml  2.89 ml/m RA Volume:   12.70 ml 8.72 ml/m LA Vol (A4C):   13.2 ml 9.06 ml/m LA Biplane Vol: 7.9 ml  5.44 ml/m  AORTIC VALVE LVOT Vmax:  87.20 cm/s LVOT Vmean:  51.900 cm/s LVOT VTI:    0.138 m  AORTA Ao Root diam: 3.10 cm Ao Asc diam:  3.20 cm MITRAL VALVE               TRICUSPID VALVE MV Area (PHT): 4.54 cm    TR Peak grad:   22.7 mmHg MV Decel Time: 167 msec    TR Vmax:        238.00 cm/s MV E velocity: 63.05 cm/s MV A velocity: 61.95 cm/s  SHUNTS MV E/A ratio:  1.02        Systemic VTI:  0.14 m                            Systemic Diam: 2.10 cm Armanda Magic MD Electronically signed by Armanda Magic MD Signature Date/Time: 08/10/2020/4:27:13 PM    Final    VAS US CAROTID  Result Date: 08/10/2020 Carotid Arterial Duplex Study Patient Name:  Vincent Horton  Date of Exam:   08/10/2020 Medical Rec #: [id number]   Accession #:    [id number] Date of Birth: [birth year]    Patient Gender: M Patient Age:   65Y Exam Location:  [facility] Procedure:      VAS US CAROTID Referring Phys: [id number] Malachi Carl STACK --------------------------------------------------------------------------------  Indications:       CVA. Comparison Study:  No prior studies. Performing Technologist: Jean Rosenthal RDMS,RVT  Examination Guidelines: A complete evaluation includes B-mode imaging, spectral Doppler, color Doppler, and power Doppler as needed of all accessible portions of each vessel. Bilateral testing is considered an integral part of a complete examination. Limited examinations for reoccurring indications may be performed as noted.  Right Carotid Findings: +----------+--------+--------+--------+------------------+------------------+           PSV cm/sEDV cm/sStenosisPlaque DescriptionComments           +----------+--------+--------+--------+------------------+------------------+ CCA Prox  68      19                                                    +----------+--------+--------+--------+------------------+------------------+ CCA Distal97      31                                intimal thickening +----------+--------+--------+--------+------------------+------------------+ ICA Prox  85      27                                                   +----------+--------+--------+--------+------------------+------------------+ ICA Distal77      27                                                   +----------+--------+--------+--------+------------------+------------------+ ECA       105     19                                intimal thickening +----------+--------+--------+--------+------------------+------------------+ +----------+--------+-------+--------+-------------------+  PSV cm/sEDV cmsDescribeArm Pressure (mmHG) +----------+--------+-------+--------+-------------------+ [id number]                                         +----------+--------+-------+--------+-------------------+ +---------+--------+--+--------+--+ VertebralPSV cm/s47EDV cm/s13 +---------+--------+--+--------+--+  Left Carotid Findings: +----------+--------+--------+--------+------------------+------------------+           PSV cm/sEDV cm/sStenosisPlaque DescriptionComments           +----------+--------+--------+--------+------------------+------------------+ CCA Prox  107     30                                intimal thickening +----------+--------+--------+--------+------------------+------------------+ CCA Distal110     33                                intimal thickening +----------+--------+--------+--------+------------------+------------------+ ICA Prox  62      19              mild, heterogenous                   +----------+--------+--------+--------+------------------+------------------+ ICA Distal71      30                                                    +----------+--------+--------+--------+------------------+------------------+ ECA       76      13                                                   +----------+--------+--------+--------+------------------+------------------+ +----------+--------+--------+--------+-------------------+           PSV cm/sEDV cm/sDescribeArm Pressure (mmHG) +----------+--------+--------+--------+-------------------+ [id number]                                         +----------+--------+--------+--------+-------------------+ +---------+--------+--+--------+-+ VertebralPSV cm/s30EDV cm/s9 +---------+--------+--+--------+-+   Summary: Right Carotid: The extracranial vessels were near-normal with only minimal wall                thickening or plaque. Left Carotid: The extracranial vessels were near-normal with only minimal wall               thickening or plaque. Vertebrals:  Bilateral vertebral arteries demonstrate antegrade flow. Subclavians: Normal flow hemodynamics were seen in bilateral subclavian              arteries. *See table(s) above for measurements and observations.  Electronically signed by Delia Heady MD on 08/10/2020 at 11:52:21 AM.    Final    US Abdomen Limited RUQ (LIVER/GB)  Result Date: 07/25/2020 CLINICAL DATA:  Transaminitis. EXAM: ULTRASOUND ABDOMEN LIMITED RIGHT UPPER QUADRANT COMPARISON:  None. FINDINGS: Gallbladder: Physiologically distended. No intraluminal gallstones or sludge. Borderline wall thickness of 3-4 mm. No sonographic Murphy sign noted by sonographer. Common bile duct: Diameter: 4 mm. Liver: No focal lesion identified. Borderline increased in parenchymal echogenicity. Portal vein is patent on color Doppler imaging with normal direction of blood flow towards the liver. Other: Right pleural effusion.  No right upper quadrant ascites. IMPRESSION: 1. Borderline increased hepatic parenchymal echogenicity which may represent mild steatosis or other intrinsic hepatocellular  disease. 2. Borderline gallbladder wall thickness of 3-4 mm, nonspecific. No gallstones or additional findings of acute cholecystitis. No biliary dilatation. 3. Right pleural effusion. Electronically Signed   By: Narda Rutherford M.D.   On: 07/25/2020 23:51    Labs:  Basic Metabolic Panel: Recent Labs  Lab 08/20/20 2332  NA 135  K 3.9  CL 102  CO2 28  GLUCOSE 96  BUN 10  CREATININE 0.65  CALCIUM 8.4*    CBC: Recent Labs  Lab 08/20/20 2332  WBC 9.0  NEUTROABS 5.9  HGB 10.1*  HCT 31.6*  MCV 94.6  PLT 263    CBG: Recent Labs  Lab 08/22/20 1128 08/22/20 1701 08/23/20 1258 08/23/20 1616 08/23/20 2106  GLUCAP 84 126* 98 104* 130*   Family history.  Positive for hypertension as well as hyperlipidemia.  Denies any colon cancer esophageal cancer or rectal cancer  Brief HPI:   Vincent Horton is a 65 y.o. right-handed male  Non-English-speaking Somali male with unknown medical history on no prescription medications.  Per chart review patient lives with his 3 daughters.  Independent prior to admission.  Presented 07/21/2020 after being found unresponsive by family.  EMS arrival patient pulseless and apneic.  Initially in V. fib total of 5 shocks 3 epis 450 mg amiodarone 15 minutes of CPR.  Patient agitated hypotensive upon arrival in the ED received Versed.  Admission chemistry glucose 275 creatinine 1.40 AST 328 ALT 251 hemoglobin A1c 6.5 troponin 2747 WBC 13,200 lactic acid 3.7.  EKG showed ST segment changes in anterior lateral leads and underwent emergent cardiac catheterization requiring LAD stenting.  Echocardiogram with ejection fraction of 30 to 35% left ventricle moderately decreased function.  Cranial CT scan negative for cerebral edema.  No evidence of anoxic brain injury.  Hypodensity in the deep white matter in the frontal lobes bilaterally likely due to chronic microvascular ischemia.  He did require vasopressors for hypotension.  Hospital course respiratory failure intubated  treated for right side aspiration pneumonia as well as patient developed right pleural effusion undergoing thoracentesis 08/01/2020 with 450 cc fluid removal.  Follow-up chest x-ray 08/02/2020 without effusion.  EEG negative for seizure but did suggest severe diffuse encephalopathy.  Patient remained on low-dose aspirin therapy as well as the addition of Brilinta.  Subcutaneous Lovenox for DVT prophylaxis.  Initially n.p.o. with alternative means of nutritional support.  MRI of the brain completed 08/07/2020 due to persistent altered mental status changes post cardiac arrest showing punctate acute to subacute left frontal infarct.  No evidence of hypoxic ischemic injury.  Patient remained on low dose aspirin Brilinta therapy for CVA prophylaxis.  Palliative care consulted to establish goals of care.  Therapy evaluations completed due to patient's altered mental status cardiac arrest decreased functional mobility was admitted for a comprehensive rehab program.  Hospital Course: Vincent Horton was admitted to rehab 08/10/2020 for inpatient therapies to consist of PT, ST and OT at least three hours five days a week. Past admission physiatrist, therapy team and rehab RN have worked together to provide customized collaborative inpatient rehab.  Pertaining to patient's debility related to CAD/anterior STEMI cardiogenic shock with occluded proximal LAD status post stenting post subacute left frontal lobe infarction remained stable and continued on aspirin as well as Brilinta.  Patient would follow-up cardiology service as well as neurology.  Bouts of anxiety Klonopin as indicated emotional  support provided.  Hospital course acute hypoxic respiratory failure extubated suspect HCAP completed 7-day course of antibiotic.  Right pleural effusion status post thoracentesis 08/01/2020 450 cc fluid yield follow-up chest x-ray without effusion.  His diet was slowly advanced to a dysphagia #1 nectar thick liquid.  Blood pressure controlled on  Coreg and monitored.  His blood pressures remain soft.  Crestor for hyperlipidemia.  Mild elevations in hemoglobin A1c 6.5 sliding scale insulin while initially in the hospital will need outpatient follow-up.  History of tobacco use NicoDerm patch patient received counsel regarding cessation of nicotine products as discussed with patient and family.  Patient with Citrobacter UTI placed on Keflex 08/22/2020.  No dysuria noted.   Blood pressures were monitored on TID basis and soft and monitored  Diabetes has been monitored with ac/hs CBG checks and SSI was use prn for tighter BS control.    Rehab course: During patient's stay in rehab weekly team conferences were held to monitor patient's progress, set goals and discuss barriers to discharge. At admission, patient required moderate assist stand pivot transfers mod max assist 14 feet rolling walker.  Moderate assist upper body bathing max is lower body bathing moderate assist upper body dressing max is lower body dressing  Physical exam.  Blood pressure 96/68 pulse 89 temperature 98.3 respirations 18 oxygen saturation 97% room air Constitutional.  No acute distress HEENT Head.  Normocephalic and atraumatic Eyes.  Pupils round and reactive to light no discharge without nystagmus Neck.  Supple nontender no JVD without thyromegaly Cardiac regular rate rhythm not extra sounds or murmur heard Abdomen.  Soft nontender positive bowel sounds without rebound Respiratory effort normal no respiratory distress without wheeze Musculoskeletal.  No swelling normal range of motion Neurologic.  Alert with assistance of video interpreter he does follow commands provides year but decrease in awareness and insight he was dysphonic.  Moves all 4 limbs.  Senses pain  He/She  has had improvement in activity tolerance, balance, postural control as well as ability to compensate for deficits. He/She has had improvement in functional use RUE/LUE  and RLE/LLE as well as  improvement in awareness.  Patient with steady progressive gains ambulating 150 feet rolling walker contact-guard assist.  Supine to sit edge of bed supervision.  Sit to stand no assistive device contact-guard.  Sidestepping in bars using level 2 Thera-Band resistance minimal assist.  Donning shoes sitting edge of bed set up.  He was able to complete bilateral upper extremity strengthening throughout sessions.  He can gather belongings for activities day living and homemaking.  Speech therapy follow-up in regards to teaching for dysphagia.  Full family teaching completed plan discharged home       Disposition: Discharged to home    Diet: Dysphagia #1 nectar liquids  Special Instructions: No driving smoking or alcohol  Medications at discharge 1.  Tylenol as needed 2.  Klonopin 0.5 mg 3 times daily 3.  Lanoxin 0.125 mg daily 4.  NicoDerm patch taper as directed 5.  Oxycodone 5 mg every 8 hours as needed pain 6.  Crestor 40 mg daily 7.  Brilinta 90 mg twice daily 8.  Keflex 250 mg every 8 hours x6 days  30-35 minutes were spent completing discharge summary and discharge planning  Discharge Instructions     Ambulatory referral to Neurology   Complete by: As directed    An appointment is requested in approximately: 4 weeks left frontal infarction   Ambulatory referral to Physical Medicine Rehab   Complete by:  As directed    Moderate complexity follow-up 2 weeks left frontal infarction/STEMI        Follow-up Information     Kirsteins, Victorino Sparrow, MD Follow up.   Specialty: Physical Medicine and Rehabilitation Why: As directed Contact information: [address] [place] [address] [phone]         Hillis Range, MD Follow up.   Specialty: Cardiology Why: Call for appointment Contact information: [address] [place] [address] [phone]                 Signed: Charlton Amor 08/24/2020, 5:27 AM

## 2020-08-22 NOTE — Progress Notes (Signed)
Patient ID: Vincent Horton, male   DOB: Dec 26, 1955, 65 y.o.   MRN: [id number]  Rolling walker ordered through [employer].  [place], [place] [phone]

## 2020-08-22 NOTE — Progress Notes (Signed)
Physical Therapy Session Note  Patient Details  Name: Vincent Horton MRN: [id number] Date of Birth: June 25, 1955  Today's Date: 08/22/2020 PT Missed Time: 45 Minutes Missed Time Reason: Patient unwilling to participate;Other (Comment) (perseverating/ focused on morning's barium swallow study)  Short Term Goals: Week 1:  PT Short Term Goal 1 (Week 1):  (inconsistent) PT Short Term Goal 1 - Progress (Week 1): Progressing toward goal PT Short Term Goal 2 (Week 1): Pt will perform bed<>chair transfers using LRAD with CGA PT Short Term Goal 2 - Progress (Week 1): Progressing toward goal (inconsistent) PT Short Term Goal 3 (Week 1): Pt will ambulate at least 157ft using LRAD with CGA PT Short Term Goal 3 - Progress (Week 1): Progressing toward goal PT Short Term Goal 4 (Week 1): Pt will ascend/descend 4 steps using HRs per home set-up with CGA PT Short Term Goal 4 - Progress (Week 1): Progressing toward goal Week 2:  PT Short Term Goal 1 (Week 2): = to LTGs based on ELOS  Skilled Therapeutic Interventions/Progress Updates:  Patient supine in bed on entrance to room. Patient asleep and initially difficult to rouse. Pt wakes and is alerted to time for PT session. Pt relates that he had a swallow study and related the steps of the swallow study and indicated that he wanted to know the results. Pt perseverates on relating story over and over. Went to look up results of swallow study but no results in system yet. Continued requests for pt to participate in therapy session, but pt continues to ask about swallow study and then turns over under blankets to go back to sleep.   Bed alarm set, and all needs within reach.    Therapy Documentation Precautions:  Precautions Precautions: Fall, Other (comment) Precaution Comments: impulsivity Restrictions Weight Bearing Restrictions: No General: PT Amount of Missed Time (min): 45 Minutes PT Missed Treatment Reason: Patient unwilling to participate;Other  (Comment) (perseverating/ focused on morning's barium swallow study)  Therapy/Group: Individual Therapy  Loel Dubonnet 08/22/2020, 12:29 PM

## 2020-08-22 NOTE — Progress Notes (Signed)
Patient ID: Vincent Horton, male   DOB: 1956/01/20, 65 y.o.   MRN: [id number]  Family education scheduled on Thursday, 6/23 1-4 PM  Lavera Guise, [place] [phone]

## 2020-08-22 NOTE — Progress Notes (Signed)
Patient ID: Vincent Horton, male   DOB: 09-Mar-1955, 65 y.o.   MRN: [id number] Team Conference Report to Patient/Family  Team Conference discussion was reviewed with the patient and caregiver, including goals, any changes in plan of care and target discharge date.  Patient and caregiver express understanding and are in agreement.  The patient has a target discharge date of 08/24/20.  Patient and son have agreed on d/c plan back home. Family will attend education on Thursday 1-4 PM. Patient will be set up with OP therapies.  Andria Rhein 08/22/2020, 11:31 AM

## 2020-08-22 NOTE — Progress Notes (Signed)
Modified Barium Swallow Progress Note  Patient Details  Name: Alvino Lechuga MRN: [id number] Date of Birth: 03-28-1955  Today's Date: 08/22/2020  Modified Barium Swallow completed.  Full report located under Chart Review in the Imaging Section.  Brief recommendations include the following:  Clinical Impression  Patient presents with a significant improvement of swallow function as compared to most recent MBS on 6/17. Of note, Cortrak feeding tube no longer present. Patient continues to exhibit delays in mastication (only has some of front top and bottom teeth) as well as delays in anterior to posterior transit of solids and liquids leading to some premature spillage of liquids prior to swallow initiation. No penetration or aspiraiton occured with cup sips of nectar thick liquids. Thin liquids resulted in trace peneration during the swallow (PAS 2) but aspiration. Trace vallecular residuals remained post initial swallow with solids and liquids, but cleared with subsequent swallows. With regular and puree solids, trace to min amount of residual observed just aboe UES opening, but with eventual clearance. Esophageal sweep performed while patient consuming puree solids and barium tablet (he had masticated) did not reveal anything significant (no radiologist to confirm or deny). SLP is recommending that patient initiate upgraded, Dys 2 solids, thin liquids.   Swallow Evaluation Recommendations       SLP Diet Recommendations: Dysphagia 2 (Fine chop) solids;Thin liquid   Liquid Administration via: Cup;Straw   Medication Administration: Crushed with puree   Supervision: Staff to assist with self feeding;Full supervision/cueing for compensatory strategies   Compensations: Minimize environmental distractions;Slow rate;Small sips/bites;Follow solids with liquid   Postural Changes: Seated upright at 90 degrees   Oral Care Recommendations: Oral care BID;Staff/trained caregiver to provide oral  care   Angela Nevin, MA, CCC-SLP Speech Therapy

## 2020-08-22 NOTE — Progress Notes (Signed)
Physical Therapy Discharge Summary  Patient Details  Name: Vincent Horton MRN: [id number] Date of Birth: 15-Mar-1955  Today's Date: 08/23/2020 PT Individual Time: 1430-1509 PT Individual Time Calculation (min): 39 min    Patient has met 9 of 9 long term goals due to improved activity tolerance, improved balance, improved postural control, increased strength, ability to compensate for deficits, improved attention, improved awareness, and improved coordination.  Patient to discharge at an ambulatory level using RW with Supervision.   Patient's care partner is independent to provide the necessary physical and cognitive assistance at discharge.  All goals met.  Recommendation:  Patient will benefit from ongoing skilled PT services in outpatient setting to continue to advance safe functional mobility, address ongoing impairments in dynamic standing balance, gait training with LRAD, B LE strengthening, cardiopulmonary endurance, and minimize fall risk.  Equipment: RW  Reasons for discharge: treatment goals met and discharge from hospital  Patient/family agrees with progress made and goals achieved: Yes  Skilled Therapeutic Interventions/Progress Updates:  Pt received supine in bed with 5 family members present for family education/training. Pt agreeable to therapy session. Therapist educated them on: pt's CLOF, need for 24hr supervision, pt's need to use RW for all standing/ambulating tasks, importance of performing provided HEP, recommendation for follow-up OPPT, and importance of including patient in daily activities around the home as is safe. Family members with several questions that therapist answered throughout session - family very involved in pt's care. Pt performed supine<>sit independently. Sit<>stands using RW with supervision. Ambulated ~184f 2x to/from therapy gym using RW with pt's family members providing supervision for safety with min progressed to no cuing from therapist. Therapist  educated pt/family on proper stair navigation and family managing AD - pt ascended/descended 12 steps with thearpist providing close supervision initially then pt's family demonstrating understanding with min cuing. Pt performed simulated ambulatory car transfer using RW with pt continuing to require cuing and education on turning to place both feet on ground prior to coming to stand when exiting vehicle. Therapist educated pt/family on use of ACE wraps or compression stockings at home to provide slight compression that helps pt with muscle soreness - demonstrated how to properly don ACE wrap with pt/family verbalizing understanding. Pt/family report no further questions/concerns for therapist and report feeling comfortable with education/training provided. At end of session pt left supine in bed with needs in reach and bed alarm on.  HEP: Access Code: [email or web address]: [email or web address] Date: 08/23/2020 Prepared by: CPage Spiro Exercises Supine Bridge with Resistance Band - 1 x daily - 7 x weekly - 2 sets - 12 reps Sit to Stand with Counter Support - 1 x daily - 7 x weekly - 2 sets - 10 reps Side Stepping with Resistance at Thighs and Counter Support - 1 x daily - 7 x weekly - 2 sets - 10 reps Heel rises with counter support - 1 x daily - 7 x weekly - 2 sets - 10 reps Forward Step Up - 1 x daily - 7 x weekly - 2 sets - 10 reps    PT Discharge Precautions/Restrictions Precautions Precautions: Fall;Other (comment) Precaution Comments: impulsivity Restrictions Weight Bearing Restrictions: No Pain Pain Assessment Pain Scale: 0-10 Pain Score: 0-No pain Perception  Perception Perception: Within Functional Limits Praxis Praxis: Intact  Cognition  Overall Cognitive Status: Impaired/Different from baseline Arousal/Alertness: Awake/alert Orientation Level: Oriented to person;Oriented to place;Oriented to situation (pt states "I'm sick" going on to state he is confused as to  whether or not he  had a stroke - poor understanding of hospital course) Attention: Focused;Selective Focused Attention: Appears intact Selective Attention: Impaired Memory: Impaired Problem Solving: Impaired Behaviors: Impulsive Safety/Judgment: Impaired Sensation  Sensation Light Touch: Appears Intact Hot/Cold: Not tested Proprioception: Appears Intact Stereognosis: Not tested Coordination Gross Motor Movements are Fluid and Coordinated: Yes Coordination and Movement Description: GM movements are WFL although still note generalized weakness Motor  Motor Motor: Other (comment) Motor - Discharge Observations: Generalized weakness with impaired endurance and balance deficits  Mobility Bed Mobility Bed Mobility: Sit to Supine;Supine to Sit Supine to Sit: Independent Sit to Supine: Independent Transfers Transfers: Sit to Stand;Stand to Sit;Stand Pivot Transfers Sit to Stand: Supervision/Verbal cueing Stand to Sit: Supervision/Verbal cueing Stand Pivot Transfers: Supervision/Verbal cueing Stand Pivot Transfer Details: Verbal cues for safe use of DME/AE;Verbal cues for sequencing;Verbal cues for precautions/safety Transfer (Assistive device): Rolling walker Locomotion  Gait Ambulation: Yes Gait Assistance: Supervision/Verbal cueing Gait Distance (Feet): 200 Feet Assistive device: Rolling walker Gait Assistance Details: Verbal cues for safe use of DME/AE;Verbal cues for gait pattern;Verbal cues for precautions/safety Gait Gait: Yes Gait Pattern: Impaired Gait Pattern: Step-through pattern;Poor foot clearance - left;Poor foot clearance - right Gait velocity: Decreased Stairs / Additional Locomotion Stairs: Yes Stairs Assistance: Supervision/Verbal cueing Stair Management Technique: Two rails Number of Stairs: 12 Height of Stairs: 6 Ramp: Supervision/Verbal cueing Curb: Contact Guard/Touching assist (using RW) Wheelchair Mobility Wheelchair Mobility: No  Trunk/Postural  Assessment  Cervical Assessment Cervical Assessment: Within Functional Limits Thoracic Assessment Thoracic Assessment: Within Functional Limits Lumbar Assessment Lumbar Assessment: Exceptions to [facility] (posterior pelvic tilt in sitting) Postural Control Postural Control: Deficits on evaluation (requires RW for safety and improved balance with standing activities)  Balance  Balance Balance Assessed: Yes Standardized Balance Assessment Standardized Balance Assessment: Berg Balance Test Berg Balance Test Sit to Stand: Able to stand  independently using hands Standing Unsupported: Able to stand safely 2 minutes Sitting with Back Unsupported but Feet Supported on Floor or Stool: Able to sit safely and securely 2 minutes Stand to Sit: Sits safely with minimal use of hands Transfers: Able to transfer safely, definite need of hands Standing Unsupported with Eyes Closed: Able to stand 10 seconds with supervision Standing Ubsupported with Feet Together: Able to place feet together independently and stand 1 minute safely From Standing, Reach Forward with Outstretched Arm: Can reach confidently >25 cm (10") From Standing Position, Pick up Object from Floor: Able to pick up shoe safely and easily From Standing Position, Turn to Look Behind Over each Shoulder: Looks behind from both sides and weight shifts well Turn 360 Degrees: Able to turn 360 degrees safely but slowly Standing Unsupported, Alternately Place Feet on Step/Stool: Able to stand independently and safely and complete 8 steps in 20 seconds Standing Unsupported, One Foot in Front: Able to plae foot ahead of the other independently and hold 30 seconds Standing on One Leg: Able to lift leg independently and hold > 10 seconds Total Score: 50 Static Sitting Balance Static Sitting - Level of Assistance: 7: Independent Dynamic Sitting Balance Dynamic Sitting - Balance Support: Feet supported;During functional activity Dynamic Sitting - Level  of Assistance: 7: Independent Static Standing Balance Static Standing - Balance Support: During functional activity;Bilateral upper extremity supported Static Standing - Level of Assistance: 5: Stand by assistance Dynamic Standing Balance Dynamic Standing - Balance Support: During functional activity;Bilateral upper extremity supported Dynamic Standing - Level of Assistance: Other (comment);5: Stand by assistance (CGA) Extremity Assessment   RLE Assessment RLE Assessment: Exceptions to [facility]  Active Range of Motion (AROM) Comments: WFL General Strength Comments: pt with difficulty following commands for formal MMT therefore assessed during functional mobility to be grossly 4+/5 LLE Assessment LLE Assessment: Exceptions to [facility] Active Range of Motion (AROM) Comments: [facility] General Strength Comments: pt with difficulty following commands for formal MMT therefore assessed during functional mobility to be grossly 4+/5    Tawana Scale , PT, DPT, CSRS 08/23/2020,6:54 PM

## 2020-08-23 LAB — URINE CULTURE: Culture: >=100000 — AB

## 2020-08-23 LAB — GLUCOSE, CAPILLARY
Glucose-Capillary: 98 mg/dL (ref 70–99)
Glucose-Capillary: 104 mg/dL — ABNORMAL HIGH (ref 70–99)
Glucose-Capillary: 130 mg/dL — ABNORMAL HIGH (ref 70–99)

## 2020-08-23 MED ORDER — ROSUVASTATIN CALCIUM 40 MG PO TABS: 40.0000 mg | ORAL_TABLET | Freq: Every day | ORAL | 0 refills | Status: DC

## 2020-08-23 MED ORDER — TICAGRELOR 90 MG PO TABS: 90.0000 mg | ORAL_TABLET | Freq: Two times a day (BID) | ORAL | 0 refills | Status: DC

## 2020-08-23 MED ORDER — ASPIRIN 81 MG PO CHEW: 81.0000 mg | CHEWABLE_TABLET | Freq: Every day | ORAL | Status: DC

## 2020-08-23 MED ORDER — CLONAZEPAM 0.5 MG PO TBDP: 0.5000 mg | ORAL_TABLET | Freq: Three times a day (TID) | ORAL | 0 refills | Status: DC

## 2020-08-23 MED ORDER — OXYCODONE HCL 5 MG PO TABS: 5.0000 mg | ORAL_TABLET | Freq: Three times a day (TID) | ORAL | 0 refills | Status: DC | PRN

## 2020-08-23 MED ORDER — NICOTINE 14 MG/24HR TD PT24: MEDICATED_PATCH | TRANSDERMAL | 0 refills | Status: DC

## 2020-08-23 MED ORDER — DIGOXIN 125 MCG PO TABS: 0.1250 mg | ORAL_TABLET | Freq: Every day | ORAL | 0 refills | Status: DC

## 2020-08-23 MED ORDER — ACETAMINOPHEN 160 MG/5ML PO SOLN: 650.0000 mg | Freq: Four times a day (QID) | ORAL | 0 refills | Status: DC | PRN

## 2020-08-23 NOTE — Progress Notes (Signed)
Occupational Therapy Session Note  Patient Details  Name: Vincent Horton MRN: [id number] Date of Birth: 07-08-1955  Today's Date: 08/23/2020 OT Individual Time: 7159-5396 OT Individual Time Calculation (min): 45 min  and Today's Date: 08/23/2020 OT Missed Time: 30 Minutes Missed Time Reason: Patient fatigue;Other (comment) (Pt participating in religious prayer, asking for therapist to come back later in day.)   Short Term Goals: Week 2:  OT Short Term Goal 1 (Week 2): Continue working on established goals at supervision level.  Skilled Therapeutic Interventions/Progress Updates:    Session 1: Pt received semi-reclined in bed, asking for therapist to come back in 30 minutes. Declining participation when offered ADL or OOB activity, pt appears to be participating in religious prayer. Pt left semi-reclined in bed, bed alarm engaged, and all immediate needs met. Pt missed 30 min of scheduled OT.   Session 2: Pt received semi-reclined in bed, denies pain, agreeable to therapy. Session focus on family edu with children/SIL re current ADL performance, toilet transfer, rec for S upon DC 2/2 decreased balance and cognitive deficits, and tub-shower transfers. Pt completes bed mobility with ind, STS and amb to and from toilet with RW and close S for safer RW use. Additionally, dons/doffs B shoes with set-up A and self-feeds with set-up A of lunch. Amb to and from therapy apartment with close S and RW. Completed TTB transfer x2 with TTB  with close S and then side-stepping with CGA and heavy Vcs for technique. Discussed TTB price and where to purchase, daughter already finding option online to purchase during session. Family with appropriate questions and no further questions for OT at this time. Amb back to room same manner as above and pt completed bed mobility mod I.   Pt left semi-reclined in bed with family present awaiting PT session, call bell in reach, and all immediate needs met.    Therapy  Documentation Precautions:  Precautions Precautions: Fall, Other (comment) Precaution Comments: impulsivity Restrictions Weight Bearing Restrictions: No  Pain:  denies ADL: See Care Tool for more details.   Therapy/Group: Individual Therapy  Volanda Napoleon MS, OTR/L   08/23/2020, 6:41 AM

## 2020-08-23 NOTE — Plan of Care (Signed)
  Problem: RH Grooming °Goal: LTG Patient will perform grooming w/assist,cues/equip (OT) °Description: LTG: Patient will perform grooming with assist, with/without cues using equipment (OT) °Outcome: Completed/Met °  °Problem: RH Bathing °Goal: LTG Patient will bathe all body parts with assist levels (OT) °Description: LTG: Patient will bathe all body parts with assist levels (OT) °Outcome: Completed/Met °  °Problem: RH Dressing °Goal: LTG Patient will perform upper body dressing (OT) °Description: LTG Patient will perform upper body dressing with assist, with/without cues (OT). °Outcome: Completed/Met °Goal: LTG Patient will perform lower body dressing w/assist (OT) °Description: LTG: Patient will perform lower body dressing with assist, with/without cues in positioning using equipment (OT) °Outcome: Completed/Met °  °Problem: RH Toileting °Goal: LTG Patient will perform toileting task (3/3 steps) with assistance level (OT) °Description: LTG: Patient will perform toileting task (3/3 steps) with assistance level (OT)  °Outcome: Completed/Met °  °Problem: RH Toilet Transfers °Goal: LTG Patient will perform toilet transfers w/assist (OT) °Description: LTG: Patient will perform toilet transfers with assist, with/without cues using equipment (OT) °Outcome: Completed/Met °  °Problem: RH Tub/Shower Transfers °Goal: LTG Patient will perform tub/shower transfers w/assist (OT) °Description: LTG: Patient will perform tub/shower transfers with assist, with/without cues using equipment (OT) °Outcome: Completed/Met °  °

## 2020-08-23 NOTE — Progress Notes (Signed)
Patient ID: Vincent Horton, male   DOB: 05-22-1955, 65 y.o.   MRN: [id number]  Shower Chair and [address]   [place], [place] [phone]

## 2020-08-23 NOTE — Progress Notes (Signed)
Physical Therapy Session Note  Patient Details  Name: Vincent Horton MRN: [id number] Date of Birth: 16-Jul-1955  Today's Date: 08/23/2020 PT Individual Time: 0930-1004 PT Individual Time Calculation (min): 34 min   Short Term Goals: Week 2:  PT Short Term Goal 1 (Week 2): = to LTGs based on ELOS  Skilled Therapeutic Interventions/Progress Updates:    Pt received supine in bed and agreeable to therapy session. Supine>sitting independently. RN in/out for medication administration. Pt donned shoes without assist. Therapist setup pt's personal RW to correct height to use during session. Sit<>stands using RW with supervision during session. Gait training ~156ft 2x to/from therapy gym using RW with supervision - continues to demo decreased safety awareness with quick movements of RW and keeping it too far forward during gait - continues to require cuing for safety and proper AD management when turning to sit. On way back to room pt quickly squatting low down in order to write his name on his RW - requires CGA for steadying upon coming to stand. Patient participated in [facility] and demonstrates moderate fall risk as noted by score of  50/56.  (<36= high risk for falls, close to 100%; 37-45 significant >80%; 46-51 moderate >50%; 52-55 lower >25%). Educated pt on results of assessment. At end of session pt left supine in bed with needs in reach and bed alarm on.  Therapy Documentation Precautions:  Precautions Precautions: Fall, Other (comment) Precaution Comments: impulsivity Restrictions Weight Bearing Restrictions: No   Pain: Denies pain during session. Upon arrival to room pt tying the theraband around his L thigh - pt states that he does this because the compression decreases the muscle "pain" or soreness he has in his quads - therapist educated pt to be careful not to restrict blood flow to his extremity.   Balance: Standardized Balance Assessment Standardized Balance Assessment: Berg  Balance Test Berg Balance Test Sit to Stand: Able to stand  independently using hands Standing Unsupported: Able to stand safely 2 minutes Sitting with Back Unsupported but Feet Supported on Floor or Stool: Able to sit safely and securely 2 minutes Stand to Sit: Sits safely with minimal use of hands Transfers: Able to transfer safely, definite need of hands Standing Unsupported with Eyes Closed: Able to stand 10 seconds with supervision Standing Ubsupported with Feet Together: Able to place feet together independently and stand 1 minute safely From Standing, Reach Forward with Outstretched Arm: Can reach confidently >25 cm (10") From Standing Position, Pick up Object from Floor: Able to pick up shoe safely and easily From Standing Position, Turn to Look Behind Over each Shoulder: Looks behind from both sides and weight shifts well Turn 360 Degrees: Able to turn 360 degrees safely but slowly Standing Unsupported, Alternately Place Feet on Step/Stool: Able to stand independently and safely and complete 8 steps in 20 seconds Standing Unsupported, One Foot in Front: Able to plae foot ahead of the other independently and hold 30 seconds Standing on One Leg: Able to lift leg independently and hold > 10 seconds Total Score: 50    Therapy/Group: Individual Therapy  Ginny Forth , PT, DPT, CSRS 08/23/2020, 10:17 AM

## 2020-08-23 NOTE — Progress Notes (Signed)
PROGRESS NOTE   Subjective/Complaints: Speaking English better asked if he could drive did not sleep , stated he did not take sleep med but trazodone was administered  ROS: Limited due to language/cognition  Objective:   DG Swallowing Func-Speech Pathology  Result Date: 08/22/2020 Formatting of this result is different from the original. Objective Swallowing Evaluation: Type of Study: MBS-Modified Barium Swallow Study  Patient Details Name: Vincent Horton MRN: [id number] Date of Birth: May 01, 1955 Today's Date: 08/22/2020 Time: SLP Start Time (ACUTE ONLY): 1455 -SLP Stop Time (ACUTE ONLY): 1525 SLP Time Calculation (min) (ACUTE ONLY): 30 min Past Medical History: Past Medical History: Diagnosis Date  Cardiac arrest (HCC)   Coronary artery disease   Dysrhythmia   Myocardial infarction [facility])  Past Surgical History: Past Surgical History: Procedure Laterality Date  CARDIAC CATHETERIZATION    CORONARY/GRAFT ACUTE MI REVASCULARIZATION N/A 07/21/2020  Procedure: Coronary/Graft Acute MI Revascularization;  Surgeon: Swaziland, Peter M, MD;  Location: [facility] INVASIVE CV LAB;  Service: Cardiovascular;  Laterality: N/A;  LEFT HEART CATH AND CORONARY ANGIOGRAPHY N/A 07/21/2020  Procedure: LEFT HEART CATH AND CORONARY ANGIOGRAPHY;  Surgeon: Swaziland, Peter M, MD;  Location: [facility] INVASIVE CV LAB;  Service: Cardiovascular;  Laterality: N/A;  RIGHT HEART CATH N/A 07/21/2020  Procedure: RIGHT HEART CATH;  Surgeon: Swaziland, Peter M, MD;  Location: [facility] INVASIVE CV LAB;  Service: Cardiovascular;  Laterality: N/A; HPI: Pt is a 65 yo male found down by family VF arrest associated with STEMI, shocked 5x, 15 min CPR before ROSC achieved. ETT 5/21-5/31. CXR 5/31 with persistent bibasilar infiltrates/edema. PMH includes cardiac arrest, CAD, dysrhythmia, myocardial infarction. Per pt's son, pt's oral intake at home was limited to very soft and/or liquidised foods PTA due to condition of dentition.   Subjective: pt alert and cooperative, needing repetition of instructions Assessment / Plan / Recommendation CHL IP CLINICAL IMPRESSIONS 08/22/2020 Clinical Impression Patient presents with a significant improvement of swallow function as compared to most recent MBS on 6/17. Of note, Cortrak feeding tube no longer present. Patient continues to exhibit delays in mastication (only has some of front top and bottom teeth) as well as delays in anterior to posterior transit of solids and liquids leading to some premature spillage of liquids prior to swallow initiation. No penetration or aspiraiton occured with cup sips of nectar thick liquids. Thin liquids resulted in trace silent peneration during the swallow (PAS 2) but aspiration. Trace vallecular residuals remained post initial swallow with solids and liquids, but cleared with subsequent swallows. With regular and puree solids, trace to min amount of residual observed just aboe UES opening, but with eventual clearance. Esophageal sweep performed while patient consuming puree solids and barium tablet (he had masticated) did not reveal anything significant (no radiologist to confirm or deny). SLP is recommending that patient initiate upgraded, Dys 2 solids, thin liquids. SLP Visit Diagnosis Dysphagia, oropharyngeal phase (R13.12) Attention and concentration deficit following -- Frontal lobe and executive function deficit following -- Impact on safety and function Mild aspiration risk   CHL IP TREATMENT RECOMMENDATION 08/06/2020 Treatment Recommendations Therapy as outlined in treatment plan below   Prognosis 08/06/2020 Prognosis for Safe Diet Advancement Good Barriers to Reach Goals Cognitive  deficits;Severity of deficits Barriers/Prognosis Comment -- CHL IP DIET RECOMMENDATION 08/22/2020 SLP Diet Recommendations Dysphagia 2 (Fine chop) solids;Thin liquid Liquid Administration via Cup;Straw Medication Administration Crushed with puree Compensations Minimize environmental  distractions;Slow rate;Small sips/bites;Follow solids with liquid Postural Changes Seated upright at 90 degrees   CHL IP OTHER RECOMMENDATIONS 08/22/2020 Recommended Consults -- Oral Care Recommendations Oral care BID;Staff/trained caregiver to provide oral care Other Recommendations --   CHL IP FOLLOW UP RECOMMENDATIONS 08/08/2020 Follow up Recommendations Inpatient Rehab   CHL IP FREQUENCY AND DURATION 08/08/2020 Speech Therapy Frequency (ACUTE ONLY) min 2x/week Treatment Duration --      CHL IP ORAL PHASE 08/22/2020 Oral Phase Impaired Oral - Pudding Teaspoon -- Oral - Pudding Cup -- Oral - Honey Teaspoon -- Oral - Honey Cup -- Oral - Nectar Teaspoon NT Oral - Nectar Cup Premature spillage;Reduced posterior propulsion;Weak lingual manipulation Oral - Nectar Straw -- Oral - Thin Teaspoon Weak lingual manipulation;Reduced posterior propulsion;Delayed oral transit Oral - Thin Cup Weak lingual manipulation;Delayed oral transit;Premature spillage Oral - Thin Straw Premature spillage;Weak lingual manipulation;Delayed oral transit Oral - Puree Delayed oral transit;Weak lingual manipulation Oral - Mech Soft -- Oral - Regular Weak lingual manipulation;Delayed oral transit;Reduced posterior propulsion;Impaired mastication Oral - Multi-Consistency -- Oral - Pill Reduced posterior propulsion;Delayed oral transit;Decreased bolus cohesion;Weak lingual manipulation Oral Phase - Comment --  CHL IP PHARYNGEAL PHASE 08/22/2020 Pharyngeal Phase Impaired Pharyngeal- Pudding Teaspoon -- Pharyngeal -- Pharyngeal- Pudding Cup -- Pharyngeal -- Pharyngeal- Honey Teaspoon -- Pharyngeal -- Pharyngeal- Honey Cup -- Pharyngeal -- Pharyngeal- Nectar Teaspoon NT Pharyngeal -- Pharyngeal- Nectar Cup Delayed swallow initiation-vallecula;Pharyngeal residue - valleculae Pharyngeal -- Pharyngeal- Nectar Straw -- Pharyngeal -- Pharyngeal- Thin Teaspoon Delayed swallow initiation-vallecula;Pharyngeal residue - valleculae;Pharyngeal residue - pyriform  Pharyngeal Material does not enter airway Pharyngeal- Thin Cup Delayed swallow initiation-vallecula;Penetration/Aspiration during swallow;Reduced airway/laryngeal closure Pharyngeal Material enters airway, remains ABOVE vocal cords then ejected out Pharyngeal- Thin Straw Delayed swallow initiation-vallecula;Pharyngeal residue - valleculae;Penetration/Aspiration during swallow;Reduced airway/laryngeal closure Pharyngeal Material enters airway, remains ABOVE vocal cords then ejected out Pharyngeal- Puree Pharyngeal residue - valleculae;Pharyngeal residue - pyriform Pharyngeal Material does not enter airway Pharyngeal- Mechanical Soft -- Pharyngeal -- Pharyngeal- Regular Delayed swallow initiation-vallecula Pharyngeal -- Pharyngeal- Multi-consistency -- Pharyngeal -- Pharyngeal- Pill WFL Pharyngeal -- Pharyngeal Comment --  CHL IP CERVICAL ESOPHAGEAL PHASE 08/22/2020 Cervical Esophageal Phase Impaired Pudding Teaspoon -- Pudding Cup -- Honey Teaspoon -- Honey Cup -- Nectar Teaspoon -- Nectar Cup -- Nectar Straw -- Thin Teaspoon -- Thin Cup -- Thin Straw -- Puree -- Mechanical Soft -- Regular -- Multi-consistency -- Pill -- Cervical Esophageal Comment -- Angela Nevin, MA, CCC-SLP Speech Therapy             Recent Labs    08/20/20 2332  WBC 9.0  HGB 10.1*  HCT 31.6*  PLT 263    Recent Labs    08/20/20 2332  NA 135  K 3.9  CL 102  CO2 28  GLUCOSE 96  BUN 10  CREATININE 0.65  CALCIUM 8.4*     Intake/Output Summary (Last 24 hours) at 08/23/2020 0743 Last data filed at 08/23/2020 0446 Gross per 24 hour  Intake 290 ml  Output 600 ml  Net -310 ml          Physical Exam: Vital Signs Blood pressure 94/66, pulse 85, temperature 98.6 F (37 C), resp. rate 16, height 5\' 4"  (1.626 m), weight 48.6 kg, SpO2 98 %.  General: No acute distress Mood and affect are appropriate  Heart: Regular rate and rhythm no rubs murmurs or extra sounds Lungs: Clear to auscultation, breathing unlabored, no  rales or wheezes Abdomen: Positive bowel sounds, soft nontender to palpation, nondistended Extremities: No clubbing, cyanosis, or edema Skin: No evidence of breakdown, no evidence of rash nd lower extremities Musculoskeletal: Full range of motion in all 4 extremities. No joint swelling Skin:    General: Skin is warm. Neurological:    Mental Status: He is alert.    Comments: Patient is alert.  With assistance of video interpreter he does follow simple commands.  Provides year in person but decreased awareness and insight as a whole  Patient is dysphonic.  4/5 BUE and BLE   Assessment/Plan: 1. Functional deficits which require 3+ hours per day of interdisciplinary therapy in a comprehensive inpatient rehab setting. Physiatrist is providing close team supervision and 24 hour management of active medical problems listed below. Physiatrist and rehab team continue to assess barriers to discharge/monitor patient progress toward functional and medical goals  Care Tool:  Bathing    Body parts bathed by patient: Right arm, Left arm, Chest, Abdomen, Front perineal area, Buttocks, Right upper leg, Left upper leg, Face, Left lower leg, Right lower leg         Bathing assist Assist Level: Supervision/Verbal cueing     Upper Body Dressing/Undressing Upper body dressing   What is the patient wearing?: Pull over shirt    Upper body assist Assist Level: Set up assist    Lower Body Dressing/Undressing Lower body dressing      What is the patient wearing?: Incontinence brief, Pants     Lower body assist Assist for lower body dressing: Supervision/Verbal cueing (sit to stand)     Toileting Toileting    Toileting assist Assist for toileting: Supervision/Verbal cueing     Transfers Chair/bed transfer  Transfers assist     Chair/bed transfer assist level: Supervision/Verbal cueing Chair/bed transfer assistive device: Geologist, engineering   Ambulation assist       Assist level: Supervision/Verbal cueing Assistive device: Walker-rolling Max distance: 152ft   Walk 10 feet activity   Assist     Assist level: Supervision/Verbal cueing Assistive device: Walker-rolling   Walk 50 feet activity   Assist    Assist level: Supervision/Verbal cueing Assistive device: Walker-rolling    Walk 150 feet activity   Assist Walk 150 feet activity did not occur: Safety/medical concerns  Assist level: Supervision/Verbal cueing Assistive device: Walker-rolling    Walk 10 feet on uneven surface  activity   Assist     Assist level: Moderate Assistance - Patient - 50 - 74% Assistive device: Other (comment), Hand held assist (railing)   Wheelchair     Assist Will patient use wheelchair at discharge?: No             Wheelchair 50 feet with 2 turns activity    Assist            Wheelchair 150 feet activity     Assist          Blood pressure 94/66, pulse 85, temperature 98.6 F (37 C), resp. rate 16, height 5\' 4"  (1.626 m), weight 48.6 kg, SpO2 98 %.  Medical Problem List and Plan: 1.  Debility with altered mental status secondary to CAD/anterior STEMI /cardiogenic shock with occluded proximal LAD status post stenting post subacute left frontal infarct             -patient may shower             -  ELOS/Goals: d/c in am  Mod I with PT/OT, supervision SLP  Continue CIR- PT, OT and SLP 2.  Impaired mobility: continue Lovenox             -antiplatelet therapy: Aspirin 81 mg daily, Brilinta 90 mg twice daily 3. Pain Management: Denies pain. Change Oxycodone to as needed 4. Anxiety: Continue Klonopin 0.5 mg 3 times daily             -antipsychotic agents: N/A 5. Neuropsych: This patient is not capable of making decisions on his own behalf.             -is fall risk, safety plan needed 6. Skin/Wound Care: Routine skin checks 7. Fluids/Electrolytes/Nutrition: Routine in and outs with follow-up chemistries on admit. 8.   Acute hypoxemic respiratory failure.  Patient extubated.  Suspect HCAP completing 7-day course of antibiotic therapy 9.  Right pleural effusion.  Status postthoracentesis 08/01/2020 with 450 cc fluid removal.  Follow-up chest x-ray 08/02/2020 without effusion 10.  Dysphagia.  NPO. NGT feedings currently -f/u MBS, plan per SLP  Upgraded to D2 thin liq 11.  Hypertension.  Coreg 3.125 mg twice daily Vitals:   08/22/20 1943 08/23/20 0430  BP: (!) 98/55 94/66  Pulse: 86 85  Resp: 16 16  Temp: 98.3 F (36.8 C) 98.6 F (37 C)  SpO2: 97% 98%  BP still soft monitor for tachy 12.  Hyperlipidemia.  Crestor 13.  New findings diabetes mellitus.  Hemoglobin A1c 6.5.  SSI.  .  Diabetic teaching CBG (last 3)  Recent Labs    08/22/20 0533 08/22/20 1128 08/22/20 1701  GLUCAP 234* 84 126*   6/23 controlled  14.  Tobacco ab use.  NicoDerm patch.  Counseling  15. Vascular congestion/coughing  Improved with albuterol   16.  Cardiomyopathy ej fx 35-40%- cont lanoxin, off coreg , spironolactone due to low BP- no signs of failure  F/u cardiology as OP 16.  Hematuria- wine (rose') colored urine saved in cup- hemorrhagic cystitis +UA, await Cx start Keflex  LOS: 13 days A FACE TO FACE EVALUATION WAS PERFORMED  Erick Colace 08/23/2020, 7:43 AM

## 2020-08-23 NOTE — Progress Notes (Signed)
Inpatient Rehabilitation Care Coordinator Discharge Note  The overall goal for the admission was met for:   Discharge location: Yes, home  Length of Stay: Yes, 14 Days  Discharge activity level: Yes, Sup/Cga  Home/community participation: Yes  Services provided included: MD, RD, PT, OT, SLP, RN, CM, TR, Pharmacy, Neuropsych, and SW  Financial Services: Medicare  Choices offered to/list presented to:pt son  Follow-up services arranged: Outpatient: [facility]  Comments (or additional information): PT OT ST Rolling Walker, Shower Chair   Patient/Family verbalized understanding of follow-up arrangements: Yes  Individual responsible for coordination of the follow-up plan: Sheryle Hail [phone]  Confirmed correct DME delivered: Dyanne Iha 08/23/2020    Dyanne Iha

## 2020-08-23 NOTE — Progress Notes (Signed)
Speech Language Pathology Discharge Summary  Patient Details  Name: Vincent Horton MRN: [id number] Date of Birth: 01-28-1956  Today's Date: 08/23/2020 SLP Individual Time: 1455-1535 SLP Individual Time Calculation (min): 40 min   Skilled Therapeutic Interventions: Patient and multiple family members all present in patient's room for skilled ST session focusing on discharge instructions related to swallow safety and physical safety. SLP described patient's current diet consistencies and progression from Cortrak feeding tube to now (Dys 2 thin liquids). SLP also discussed patient's progress with cognitive function, improved expressive speech and language, but also his continued difficulty with awareness, self-monitoring, attention and recall. All questions answered to family's satisfaction.     Patient has met 7 of 10 long term goals.  Patient to discharge at overall Min;Mod level.  Reasons goals not met: Patient continues to require min-modA for memory and orientation to time/situation   Clinical Impression/Discharge Summary: Patient met 7/10 LTG's in areas of cognition, language, speech and swallow function. At time of discharge, patient is tolerating a Dys 2 solids, thin liquids diet which had been upgraded from Cortrak feeding tube at CIR admission, then progressing to Dys 1 nectar thick liquids. Patient has improved with his ability to maintain attention and effectively communicate during structured conversation or when responding to open-ended questions. He continues to struggle with memory, awareness and initiation when working on problem solving tasks. Patient safet for discharge home with family providing 24 hour supervision and assist.  Care Partner:  Caregiver Able to Provide Assistance: Yes  Type of Caregiver Assistance: Physical;Cognitive  Recommendation:  Outpatient SLP;24 hour supervision/assistance  Rationale for SLP Follow Up: Maximize functional communication;Maximize swallowing  safety;Maximize cognitive function and independence   Equipment: N/A   Reasons for discharge: Discharged from hospital   Patient/Family Agrees with Progress Made and Goals Achieved: Yes    Sonia Baller, MA, CCC-SLP Speech Therapy

## 2020-08-23 NOTE — Progress Notes (Addendum)
Occupational Therapy Session Note  Patient Details  Name: Kamen Hanken MRN: [id number] Date of Birth: 1955-04-28  Today's Date: 08/23/2020 OT Individual Time: 1004-1008 OT Individual Time Calculation (min): 4 min    Short Term Goals: Week 2:  OT Short Term Goal 1 (Week 2): Continue working on established goals at supervision level.  Skilled Therapeutic Interventions/Progress Updates:    Pt received supine in bed, Somali interpreter virtually present, reporting no pain. Pt reporting fatigue and refused to participate in OT at this time stating he was "too tired" and his daughter was coming later "to take me home." Pt educated on scheduled therapies later in day and he agreed he would work hard during later sessions despite encouragement and multiple options for activities. Pt requested heat on in room, remained supine with alarm set, all needs met.   Therapy Documentation Precautions:  Precautions Precautions: Fall, Other (comment) Precaution Comments: impulsivity Restrictions Weight Bearing Restrictions: No General: General OT Amount of Missed Time: 30 Minutes  Pain: Pain Assessment Pain Scale: 0-10 Pain Score: 0-No pain   Therapy/Group: Individual Therapy  Mellissa Kohut 08/23/2020, 1:05 PM

## 2020-08-24 LAB — GLUCOSE, CAPILLARY: Glucose-Capillary: 99 mg/dL (ref 70–99)

## 2020-08-24 MED ORDER — CEPHALEXIN 250 MG PO CAPS: 250.0000 mg | ORAL_CAPSULE | Freq: Three times a day (TID) | ORAL | 8 refills | Status: DC

## 2020-08-24 NOTE — Progress Notes (Signed)
PROGRESS NOTE   Subjective/Complaints: No issues overnite  ROS: Limited due to language/cognition  Objective:   DG Swallowing Func-Speech Pathology  Result Date: 08/22/2020 Formatting of this result is different from the original. Objective Swallowing Evaluation: Type of Study: MBS-Modified Barium Swallow Study  Patient Details Name: Vincent Horton MRN: [id number] Date of Birth: [birth year] Today's Date: 08/22/2020 Time: SLP Start Time (ACUTE ONLY): 1455 -SLP Stop Time (ACUTE ONLY): 1525 SLP Time Calculation (min) (ACUTE ONLY): 30 min Past Medical History: Past Medical History: Diagnosis Date  Cardiac arrest (HCC)   Coronary artery disease   Dysrhythmia   Myocardial infarction [facility])  Past Surgical History: Past Surgical History: Procedure Laterality Date  CARDIAC CATHETERIZATION    CORONARY/GRAFT ACUTE MI REVASCULARIZATION N/A 07/21/2020  Procedure: Coronary/Graft Acute MI Revascularization;  Surgeon: Swaziland, Peter M, MD;  Location: [facility] INVASIVE CV LAB;  Service: Cardiovascular;  Laterality: N/A;  LEFT HEART CATH AND CORONARY ANGIOGRAPHY N/A 07/21/2020  Procedure: LEFT HEART CATH AND CORONARY ANGIOGRAPHY;  Surgeon: Swaziland, Peter M, MD;  Location: [facility] INVASIVE CV LAB;  Service: Cardiovascular;  Laterality: N/A;  RIGHT HEART CATH N/A 07/21/2020  Procedure: RIGHT HEART CATH;  Surgeon: Swaziland, Peter M, MD;  Location: [facility] INVASIVE CV LAB;  Service: Cardiovascular;  Laterality: N/A; HPI: Pt is a 65 yo male found down by family VF arrest associated with STEMI, shocked 5x, 15 min CPR before ROSC achieved. ETT 5/21-5/31. CXR 5/31 with persistent bibasilar infiltrates/edema. PMH includes cardiac arrest, CAD, dysrhythmia, myocardial infarction. Per pt's son, pt's oral intake at home was limited to very soft and/or liquidised foods PTA due to condition of dentition.  Subjective: pt alert and cooperative, needing repetition of instructions Assessment / Plan / Recommendation  CHL IP CLINICAL IMPRESSIONS 08/22/2020 Clinical Impression Patient presents with a significant improvement of swallow function as compared to most recent MBS on 6/17. Of note, Cortrak feeding tube no longer present. Patient continues to exhibit delays in mastication (only has some of front top and bottom teeth) as well as delays in anterior to posterior transit of solids and liquids leading to some premature spillage of liquids prior to swallow initiation. No penetration or aspiraiton occured with cup sips of nectar thick liquids. Thin liquids resulted in trace silent peneration during the swallow (PAS 2) but aspiration. Trace vallecular residuals remained post initial swallow with solids and liquids, but cleared with subsequent swallows. With regular and puree solids, trace to min amount of residual observed just aboe UES opening, but with eventual clearance. Esophageal sweep performed while patient consuming puree solids and barium tablet (he had masticated) did not reveal anything significant (no radiologist to confirm or deny). SLP is recommending that patient initiate upgraded, Dys 2 solids, thin liquids. SLP Visit Diagnosis Dysphagia, oropharyngeal phase (R13.12) Attention and concentration deficit following -- Frontal lobe and executive function deficit following -- Impact on safety and function Mild aspiration risk   CHL IP TREATMENT RECOMMENDATION 08/06/2020 Treatment Recommendations Therapy as outlined in treatment plan below   Prognosis 08/06/2020 Prognosis for Safe Diet Advancement Good Barriers to Reach Goals Cognitive deficits;Severity of deficits Barriers/Prognosis Comment -- CHL IP DIET RECOMMENDATION 08/22/2020 SLP Diet Recommendations Dysphagia 2 (Fine chop) solids;Thin liquid  Liquid Administration via Cup;Straw Medication Administration Crushed with puree Compensations Minimize environmental distractions;Slow rate;Small sips/bites;Follow solids with liquid Postural Changes Seated upright at 90 degrees    CHL IP OTHER RECOMMENDATIONS 08/22/2020 Recommended Consults -- Oral Care Recommendations Oral care BID;Staff/trained caregiver to provide oral care Other Recommendations --   CHL IP FOLLOW UP RECOMMENDATIONS 08/08/2020 Follow up Recommendations Inpatient Rehab   CHL IP FREQUENCY AND DURATION 08/08/2020 Speech Therapy Frequency (ACUTE ONLY) min 2x/week Treatment Duration --      CHL IP ORAL PHASE 08/22/2020 Oral Phase Impaired Oral - Pudding Teaspoon -- Oral - Pudding Cup -- Oral - Honey Teaspoon -- Oral - Honey Cup -- Oral - Nectar Teaspoon NT Oral - Nectar Cup Premature spillage;Reduced posterior propulsion;Weak lingual manipulation Oral - Nectar Straw -- Oral - Thin Teaspoon Weak lingual manipulation;Reduced posterior propulsion;Delayed oral transit Oral - Thin Cup Weak lingual manipulation;Delayed oral transit;Premature spillage Oral - Thin Straw Premature spillage;Weak lingual manipulation;Delayed oral transit Oral - Puree Delayed oral transit;Weak lingual manipulation Oral - Mech Soft -- Oral - Regular Weak lingual manipulation;Delayed oral transit;Reduced posterior propulsion;Impaired mastication Oral - Multi-Consistency -- Oral - Pill Reduced posterior propulsion;Delayed oral transit;Decreased bolus cohesion;Weak lingual manipulation Oral Phase - Comment --  CHL IP PHARYNGEAL PHASE 08/22/2020 Pharyngeal Phase Impaired Pharyngeal- Pudding Teaspoon -- Pharyngeal -- Pharyngeal- Pudding Cup -- Pharyngeal -- Pharyngeal- Honey Teaspoon -- Pharyngeal -- Pharyngeal- Honey Cup -- Pharyngeal -- Pharyngeal- Nectar Teaspoon NT Pharyngeal -- Pharyngeal- Nectar Cup Delayed swallow initiation-vallecula;Pharyngeal residue - valleculae Pharyngeal -- Pharyngeal- Nectar Straw -- Pharyngeal -- Pharyngeal- Thin Teaspoon Delayed swallow initiation-vallecula;Pharyngeal residue - valleculae;Pharyngeal residue - pyriform Pharyngeal Material does not enter airway Pharyngeal- Thin Cup Delayed swallow  initiation-vallecula;Penetration/Aspiration during swallow;Reduced airway/laryngeal closure Pharyngeal Material enters airway, remains ABOVE vocal cords then ejected out Pharyngeal- Thin Straw Delayed swallow initiation-vallecula;Pharyngeal residue - valleculae;Penetration/Aspiration during swallow;Reduced airway/laryngeal closure Pharyngeal Material enters airway, remains ABOVE vocal cords then ejected out Pharyngeal- Puree Pharyngeal residue - valleculae;Pharyngeal residue - pyriform Pharyngeal Material does not enter airway Pharyngeal- Mechanical Soft -- Pharyngeal -- Pharyngeal- Regular Delayed swallow initiation-vallecula Pharyngeal -- Pharyngeal- Multi-consistency -- Pharyngeal -- Pharyngeal- Pill WFL Pharyngeal -- Pharyngeal Comment --  CHL IP CERVICAL ESOPHAGEAL PHASE 08/22/2020 Cervical Esophageal Phase Impaired Pudding Teaspoon -- Pudding Cup -- Honey Teaspoon -- Honey Cup -- Nectar Teaspoon -- Nectar Cup -- Nectar Straw -- Thin Teaspoon -- Thin Cup -- Thin Straw -- Puree -- Mechanical Soft -- Regular -- Multi-consistency -- Pill -- Cervical Esophageal Comment -- Angela Nevin, MA, CCC-SLP Speech Therapy             No results for input(s): WBC, HGB, HCT, PLT in the last 72 hours.  No results for input(s): NA, K, CL, CO2, GLUCOSE, BUN, CREATININE, CALCIUM in the last 72 hours.   Intake/Output Summary (Last 24 hours) at 08/24/2020 0734 Last data filed at 08/23/2020 1700 Gross per 24 hour  Intake 354 ml  Output --  Net 354 ml          Physical Exam: Vital Signs Blood pressure 98/66, pulse 78, temperature 98.5 F (36.9 C), temperature source Oral, resp. rate 18, height 5\' 4"  (1.626 m), weight 50.3 kg, SpO2 100 %.   General: No acute distress Mood and affect are appropriate Heart: Regular rate and rhythm no rubs murmurs or extra sounds Lungs: Clear to auscultation, breathing unlabored, no rales or wheezes Abdomen: Positive bowel sounds, soft nontender to palpation,  nondistended Extremities: No clubbing, cyanosis, or edema   Skin:  General: Skin is warm. Neurological:    Mental Status: He is alert.    Comments: Patient is alert.  With assistance of video interpreter he does follow simple commands.  Provides year in person but decreased awareness and insight as a whole  Patient is dysphonic.  4/5 BUE and BLE   Assessment/Plan: 1. 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  Stable for D/C today F/u PCP in 3-4 weeks F/u PM&R 2 weeks F/u cards 1 mo See D/C summary See D/C instructions  Care Tool:  Bathing    Body parts bathed by patient: Right arm, Left arm, Chest, Abdomen, Front perineal area, Buttocks, Right upper leg, Left upper leg, Face, Left lower leg, Right lower leg         Bathing assist Assist Level: Supervision/Verbal cueing     Upper Body Dressing/Undressing Upper body dressing   What is the patient wearing?: Pull over shirt    Upper body assist Assist Level: Set up assist    Lower Body Dressing/Undressing Lower body dressing      What is the patient wearing?: Pants     Lower body assist Assist for lower body dressing: Set up assist     Toileting Toileting    Toileting assist Assist for toileting: Supervision/Verbal cueing     Transfers Chair/bed transfer  Transfers assist     Chair/bed transfer assist level: Supervision/Verbal cueing Chair/bed transfer assistive device: Geologist, engineering   Ambulation assist      Assist level: Supervision/Verbal cueing Assistive device: Walker-rolling Max distance: 167ft   Walk 10 feet activity   Assist     Assist level: Supervision/Verbal cueing Assistive device: Walker-rolling   Walk 50 feet activity   Assist    Assist level: Supervision/Verbal cueing Assistive device: Walker-rolling    Walk 150 feet activity   Assist Walk 150 feet activity did not occur: Safety/medical concerns  Assist level:  Supervision/Verbal cueing Assistive device: Walker-rolling    Walk 10 feet on uneven surface  activity   Assist     Assist level: Moderate Assistance - Patient - 50 - 74% Assistive device: Other (comment), Hand held assist (railing)   Wheelchair     Assist Will patient use wheelchair at discharge?: No             Wheelchair 50 feet with 2 turns activity    Assist            Wheelchair 150 feet activity     Assist          Blood pressure 98/66, pulse 78, temperature 98.5 F (36.9 C), temperature source Oral, resp. rate 18, height 5\' 4"  (1.626 m), weight 50.3 kg, SpO2 100 %.  Medical Problem List and Plan: 1.  Debility with altered mental status secondary to CAD/anterior STEMI /cardiogenic shock with occluded proximal LAD status post stenting post subacute left frontal infarct             -patient may shower             -ELOS/Goals: d/c today  Mod I with PT/OT, supervision SLP  Continue CIR- PT, OT and SLP 2.  Impaired mobility: continue Lovenox             -antiplatelet therapy: Aspirin 81 mg daily, Brilinta 90 mg twice daily 3. Pain Management: Denies pain. Change Oxycodone to as needed 4. Anxiety: Continue Klonopin 0.5 mg 3 times daily             -  antipsychotic agents: N/A 5. Neuropsych: This patient is not capable of making decisions on his own behalf.             -is fall risk, safety plan needed 6. Skin/Wound Care: Routine skin checks 7. Fluids/Electrolytes/Nutrition: Routine in and outs with follow-up chemistries on admit. 8.  Acute hypoxemic respiratory failure.  Patient extubated.  Suspect HCAP completing 7-day course of antibiotic therapy 9.  Right pleural effusion.  Status postthoracentesis 08/01/2020 with 450 cc fluid removal.  Follow-up chest x-ray 08/02/2020 without effusion 10.  Dysphagia.  NPO. NGT feedings currently -f/u MBS, plan per SLP  Upgraded to D2 thin liq 11.  Hypertension.  Coreg 3.125 mg twice daily Vitals:   08/23/20 1949  08/24/20 0338  BP: 93/66 98/66  Pulse: 81 78  Resp: 20 18  Temp: (!) 97.4 F (36.3 C) 98.5 F (36.9 C)  SpO2: 98% 100%  BP still soft but asympt 12.  Hyperlipidemia.  Crestor 13.  New findings diabetes mellitus.  Hemoglobin A1c 6.5.  SSI.  .  Diabetic teaching CBG (last 3)  Recent Labs    08/23/20 1616 08/23/20 2106 08/24/20 0620  GLUCAP 104* 130* 99   6/24 controlled  14.  Tobacco ab use.  NicoDerm patch.  Counseling  15. Vascular congestion/coughing  Improved with albuterol   16.  Cardiomyopathy ej fx 35-40%- cont lanoxin, off coreg , spironolactone due to low BP- no signs of failure  F/u cardiology as OP 16.  Hematuria- wine (rose') colored urine saved in cup- hemorrhagic cystitis +UA, await Cx start Keflex  LOS: 14 days A FACE TO FACE EVALUATION WAS PERFORMED  Erick Colace 08/24/2020, 7:34 AM

## 2020-08-24 NOTE — Progress Notes (Signed)
Patient discharged off of unit with all belongings. Discharge papers/instructions explained by physician assistant to family. Patient and family have no further questions at time of discharge. No complications noted at this time.  Alvie Speltz L Bladyn Tipps  

## 2020-08-27 ENCOUNTER — Telehealth: Payer: Self-pay

## 2020-08-27 NOTE — Telephone Encounter (Signed)
Transitional Care call--daughter-Sadia    Are you/is patient experiencing any problems since coming home? Having trouble sleeping, will try giving him Clonazepam to relax him Are there any questions regarding any aspect of care? No Are there any questions regarding medications administration/dosing? About taking medications together, advised not to take Clonazepam and Oxycodone, but ok to take Keflex and Clonazepam together. Are meds being taken as prescribed? Yes Patient should review meds with caller to confirm Have there been any falls? No Has Home Health been to the house and/or have they contacted you? Outpt therapy If not, have you tried to contact them? Can we help you contact them? Are bowels and bladder emptying properly? Yes Are there any unexpected incontinence issues? No If applicable, is patient following bowel/bladder programs? Any fevers, problems with breathing, unexpected pain? No Are there any skin problems or new areas of breakdown? Yes on back of head and under a bandage, cleaning it and applying antibiotic ointment Has the patient/family member arranged specialty MD follow up (ie cardiology/neurology/renal/surgical/etc)?  Yes Can we help arrange? Does the patient need any other services or support that we can help arrange? Possible transportation. Advised daughter to [employer] transportation programs for area. Are caregivers following through as expected in assisting the patient? Yes Has the patient quit smoking, drinking alcohol, or using drugs as recommended? Yes  Appointment time 9:40, arrive time 9:20 on 08/30/20 with Riley Lam, then Dr. Wynn Banker [address] [phone]

## 2020-08-30 ENCOUNTER — Encounter: Payer: Medicare Other | Attending: Registered Nurse | Admitting: Registered Nurse

## 2020-08-30 ENCOUNTER — Other Ambulatory Visit: Payer: Self-pay

## 2020-08-30 ENCOUNTER — Encounter: Payer: Self-pay | Admitting: Registered Nurse

## 2020-08-30 VITALS — BP 99/65 | HR 96 | Temp 97.9°F | Ht 64.0 in | Wt 105.6 lb

## 2020-08-30 DIAGNOSIS — I2102 ST elevation (STEMI) myocardial infarction involving left anterior descending coronary artery: Secondary | ICD-10-CM

## 2020-08-30 DIAGNOSIS — I6300 Cerebral infarction due to thrombosis of unspecified precerebral artery: Secondary | ICD-10-CM | POA: Diagnosis not present

## 2020-08-30 NOTE — Progress Notes (Signed)
Subjective:    Patient ID: Vincent Horton, male    DOB: January 23, 1956, 65 y.o.   MRN: [id number]  HPI: Vincent Horton is a 65 y.o. male who is a Non-English speaking Somali male who is here for Transitional Care visit for follow up of his STEMI and Cerebro Vascular Accident.   He arrived to [facility] on 07/21/2020 via EMS: Per Dr Pilar Plate Note: Vincent Horton is a 65 y.o. year-old male with unknown past medical history presenting to the ED with chief complaint of cardiac.   Found down at home, EMS called, pulseless, CPR initiated. CPR for 15 minutes in the field, shocked x5 course of V. fib.  ROSC achieved.  EKG after ROSC revealing STEMI, code STEMI initiated prior to arrival.  Patient required epi drip in the field initially but then discontinued with better pressures. Cardiology Consulted.   Vincent Horton underwent : see below by Dr Swaziland on 07/21/2020 Coronary/Graft Acute MI Revascularization N/A Local  LEFT HEART CATH AND CORONARY ANGIOGRAPHY N/A Local  RIGHT HEART CATH    CT Head: WO Contrast:  IMPRESSION: Negative for cerebral edema.  No evidence of anoxic brain injury   Hypodensity in the deep white matter in the frontal lobes bilaterally likely due to chronic microvascular ischemia.  MR Brain WO Contrast:  IMPRESSION: Punctate acute to subacute left frontal infarct. No evidence of hypoxic/ischemic injury.  Neurology consulted, Vincent Horton remained on low dose aspirin and Brilinta.   Vincent Horton was admitted to inpatient rehabilitation on 08/10/2020 and discharged home on 08/24/2020. He is receiving outpatient therapy at [facility], [facility], [facility] . He denies pain. Reports he has a good appetite.   [facility] was called to schedule HFU appointment..  Vincent Horton son Karie Mainland in room.        Pain Inventory Average Pain 0 Pain Right Now 0 My pain is  no pain  LOCATION OF PAIN  no pain   BOWEL Number of stools per week: 7 Oral laxative use No  Type of laxative n/a Enema or suppository use No  History of  colostomy No  Incontinent No   BLADDER Normal In and out cath, frequency n/a Able to self cath Yes  Bladder incontinence No  Frequent urination No  Leakage with coughing No  Difficulty starting stream No  Incomplete bladder emptying No    Mobility walk with assistance use a walker how many minutes can you walk? 20 mins ability to climb steps?  yes  Function not employed: date last employed n/a I need assistance with the following:  household duties and shopping  Neuro/Psych trouble walking  Prior Studies N/a  Physicians involved in your care N/a   No family history on file. Social History   Socioeconomic History   Marital status: Widowed    Spouse name: Not on file   Number of children: Not on file   Years of education: Not on file   Highest education level: Not on file  Occupational History   Not on file  Tobacco Use   Smoking status: Every Day    Pack years: 0.00   Smokeless tobacco: Never  Vaping Use   Vaping Use: Unknown  Substance and Sexual Activity   Alcohol use: Not Currently   Drug use: Not Currently   Sexual activity: Not on file  Other Topics Concern   Not on file  Social History Narrative   Not on file   Social Determinants of Health   Financial Resource Strain: Not on file  Food  Insecurity: Not on file  Transportation Needs: Not on file  Physical Activity: Not on file  Stress: Not on file  Social Connections: Not on file   Past Surgical History:  Procedure Laterality Date   CARDIAC CATHETERIZATION     CORONARY/GRAFT ACUTE MI REVASCULARIZATION N/A 07/21/2020   Procedure: Coronary/Graft Acute MI Revascularization;  Surgeon: Swaziland, Peter M, MD;  Location: [facility] INVASIVE CV LAB;  Service: Cardiovascular;  Laterality: N/A;   LEFT HEART CATH AND CORONARY ANGIOGRAPHY N/A 07/21/2020   Procedure: LEFT HEART CATH AND CORONARY ANGIOGRAPHY;  Surgeon: Swaziland, Peter M, MD;  Location: [facility] INVASIVE CV LAB;  Service: Cardiovascular;  Laterality: N/A;    RIGHT HEART CATH N/A 07/21/2020   Procedure: RIGHT HEART CATH;  Surgeon: Swaziland, Peter M, MD;  Location: [facility] INVASIVE CV LAB;  Service: Cardiovascular;  Laterality: N/A;   Past Medical History:  Diagnosis Date   Cardiac arrest [facility])    Coronary artery disease    Dysrhythmia    Myocardial infarction (HCC)    BP 99/65 (BP Location: Right Arm, Patient Position: Sitting, Cuff Size: Small)   Pulse 96   Temp 97.9 F (36.6 C) (Oral)   Ht 5\' 4"  (1.626 m)   Wt 105 lb 9.6 oz (47.9 kg)   SpO2 99%   BMI 18.13 kg/m   Opioid Risk Score:   Fall Risk Score:  `1  Depression screen PHQ 2/9  No flowsheet data found.   Review of Systems  Constitutional:  Positive for unexpected weight change.  HENT: Negative.    Eyes: Negative.   Respiratory: Negative.    Gastrointestinal: Negative.   Endocrine: Negative.   Genitourinary: Negative.   Musculoskeletal:  Positive for gait problem.  Skin: Negative.   Allergic/Immunologic: Negative.   Hematological: Negative.   Psychiatric/Behavioral: Negative.        Objective:   Physical Exam Vitals and nursing note reviewed.  Constitutional:      Appearance: Normal appearance.  Cardiovascular:     Rate and Rhythm: Normal rate and regular rhythm.     Pulses: Normal pulses.     Heart sounds: Normal heart sounds.  Pulmonary:     Effort: Pulmonary effort is normal.     Breath sounds: Normal breath sounds.  Musculoskeletal:     Cervical back: Normal range of motion and neck supple.     Comments: Normal Muscle Bulk and Muscle Testing Reveals:  Upper Extremities: Full ROM and Muscle Strength 4/5  Lower Extremities: Full ROM and Muscle Strength 5/5 Arises from Table slowly using walker for support Narrow Based Gait     Skin:    General: Skin is warm and dry.  Neurological:     Mental Status: He is alert and oriented to person, place, and time.  Psychiatric:        Mood and Affect: Mood normal.        Behavior: Behavior normal.          Assessment & Plan:  1.STEMI: Involving Left Anterior Descending Artery" Continue Outpatient Therapy: He has a scheduled appointment with Neurology.Continue current medication regimen. Continue to monitor.  2. Cerebro Vascular Accident. .Continue outpatient therapy . Continue current medication regimen. He will F/U with Neurology. Continue to Monitor.   F/U with Dr 4/9 in 4-6 weeks

## 2020-09-04 ENCOUNTER — Encounter: Payer: Self-pay | Admitting: Family

## 2020-09-11 ENCOUNTER — Ambulatory Visit (HOSPITAL_COMMUNITY)
Admission: EM | Admit: 2020-09-11 | Discharge: 2020-09-11 | Disposition: A | Discharge status: Home / Self Care | Payer: Medicare Other | Attending: Family | Admitting: Family

## 2020-09-11 ENCOUNTER — Other Ambulatory Visit: Payer: Self-pay

## 2020-09-11 ENCOUNTER — Emergency Department (HOSPITAL_COMMUNITY)
Admission: EM | Admit: 2020-09-11 | Discharge: 2020-09-12 | Disposition: A | Discharge status: Home / Self Care | Payer: Medicare Other | Attending: Emergency Medicine | Admitting: Emergency Medicine

## 2020-09-11 ENCOUNTER — Encounter (HOSPITAL_COMMUNITY): Payer: Self-pay

## 2020-09-11 ENCOUNTER — Emergency Department (HOSPITAL_COMMUNITY): Payer: Medicare Other

## 2020-09-11 DIAGNOSIS — N5089 Other specified disorders of the male genital organs: Secondary | ICD-10-CM | POA: Diagnosis not present

## 2020-09-11 DIAGNOSIS — R3129 Other microscopic hematuria: Secondary | ICD-10-CM | POA: Diagnosis not present

## 2020-09-11 DIAGNOSIS — Z955 Presence of coronary angioplasty implant and graft: Secondary | ICD-10-CM | POA: Diagnosis not present

## 2020-09-11 DIAGNOSIS — I2510 Atherosclerotic heart disease of native coronary artery without angina pectoris: Secondary | ICD-10-CM | POA: Insufficient documentation

## 2020-09-11 DIAGNOSIS — R1031 Right lower quadrant pain: Secondary | ICD-10-CM | POA: Diagnosis not present

## 2020-09-11 DIAGNOSIS — N50812 Left testicular pain: Secondary | ICD-10-CM | POA: Diagnosis present

## 2020-09-11 DIAGNOSIS — R1032 Left lower quadrant pain: Secondary | ICD-10-CM

## 2020-09-11 DIAGNOSIS — N451 Epididymitis: Secondary | ICD-10-CM | POA: Diagnosis not present

## 2020-09-11 DIAGNOSIS — F17200 Nicotine dependence, unspecified, uncomplicated: Secondary | ICD-10-CM | POA: Insufficient documentation

## 2020-09-11 DIAGNOSIS — Z7982 Long term (current) use of aspirin: Secondary | ICD-10-CM | POA: Insufficient documentation

## 2020-09-11 LAB — POCT URINALYSIS DIPSTICK, ED / UC
Bilirubin Urine: NEGATIVE
Glucose, UA: NEGATIVE mg/dL
Ketones, ur: NEGATIVE mg/dL
Leukocytes,Ua: NEGATIVE
Nitrite: NEGATIVE
Protein, ur: NEGATIVE mg/dL
Specific Gravity, Urine: 1.01 (ref 1.005–1.030)
Urobilinogen, UA: 0.2 mg/dL (ref 0.0–1.0)
pH: 5 (ref 5.0–8.0)

## 2020-09-11 NOTE — ED Triage Notes (Signed)
Pt c/o of left testicle swelling onset Saturday. Denies discharge. Denies urinary problems. Denies fevers.

## 2020-09-11 NOTE — ED Provider Notes (Signed)
[facility]    CSN: [id number] Arrival date & time: 09/11/20  1727      History   Chief Complaint Chief Complaint  Patient presents with   Abdominal Pain   Back Pain   Groin Pain    HPI Vincent Horton is a 65 y.o. male.   65 year old male accompanied by a care giver with concern over left lower groin pain and left lower abdominal pain (not on right side as listed in nurse triage notes). Pain started 4 days ago and he has noticed left testicule more swollen, red, hard and painful than right. Pain and swelling travel up to left groin area. Having difficulty walking due to pain. Denies any fever, chest pain, difficulty breathing, nausea, vomiting, diarrhea, constipation, or urethral discharge. Also denies any pain with urination or difficulty urinating or blood in his urine. Has taken Tylenol with minimal relief. 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 Currently on Digoxin, Brilinta, aspirin, Crestor, and Klonopin daily. No longer needing to take oxycodone. Went home on Keflex due to possible UTI at discharge and is still taking per caregiver. No previous history of hernia or testicular torsion or injury to groin.   The history is provided by the patient and a caregiver. The history is limited by a language barrier. Language interpreter used: Caregiver helped with Interpretation.   Past Medical History:  Diagnosis Date   Cardiac arrest [facility])    Coronary artery disease    Dysrhythmia    Myocardial infarction [facility])     Patient Active Problem List   Diagnosis Date Noted   CVA (cerebral vascular accident) (HCC) 08/10/2020   Pleural effusion    Protein-calorie malnutrition, severe 07/25/2020   Acute hypoxemic respiratory failure (HCC) 07/24/2020   Ischemic cardiomyopathy 07/24/2020   Aspiration pneumonia (HCC) 07/24/2020   Pulmonary edema cardiac cause (HCC) 07/24/2020   Acute kidney injury (HCC) 07/24/2020   Shock liver 07/24/2020   Cardiogenic  shock (HCC) 07/24/2020   Transaminitis 07/24/2020   Protein calorie malnutrition (HCC) 07/23/2020   Acute ST elevation myocardial infarction (STEMI) due to occlusion of left anterior descending (LAD) coronary artery (HCC) 07/21/2020   Cardiac arrest (HCC) 07/21/2020   STEMI involving left anterior descending coronary artery (HCC) 07/21/2020    Past Surgical History:  Procedure Laterality Date   CARDIAC CATHETERIZATION     CORONARY/GRAFT ACUTE MI REVASCULARIZATION N/A 07/21/2020   Procedure: Coronary/Graft Acute MI Revascularization;  Surgeon: Swaziland, Peter M, MD;  Location: [facility] INVASIVE CV LAB;  Service: Cardiovascular;  Laterality: N/A;   LEFT HEART CATH AND CORONARY ANGIOGRAPHY N/A 07/21/2020   Procedure: LEFT HEART CATH AND CORONARY ANGIOGRAPHY;  Surgeon: Swaziland, Peter M, MD;  Location: [facility] INVASIVE CV LAB;  Service: Cardiovascular;  Laterality: N/A;   RIGHT HEART CATH N/A 07/21/2020   Procedure: RIGHT HEART CATH;  Surgeon: Swaziland, Peter M, MD;  Location: [facility] INVASIVE CV LAB;  Service: Cardiovascular;  Laterality: N/A;       Home Medications    Prior to Admission medications   Medication Sig Start Date End Date Taking? Authorizing Provider  acetaminophen (TYLENOL) 160 MG/5ML solution Take 20.3 mLs (650 mg total) by mouth every 6 (six) hours as needed for fever. 08/23/20   Angiulli, Mcarthur Rossetti, PA-C  aspirin 81 MG chewable tablet Chew 1 tablet (81 mg total) by mouth daily. 08/23/20   Angiulli, Mcarthur Rossetti, PA-C  cephALEXin (KEFLEX) 250 MG capsule Take 1 capsule (250 mg total) by mouth  every 8 (eight) hours. 08/24/20   Angiulli, Mcarthur Rossetti, PA-C  clonazePAM (KLONOPIN) 0.5 MG disintegrating tablet Take 1 tablet (0.5 mg total) by mouth 3 (three) times daily. 08/23/20   Angiulli, Mcarthur Rossetti, PA-C  digoxin (LANOXIN) 0.125 MG tablet Take 1 tablet (0.125 mg total) by mouth daily. 08/23/20   Angiulli, Mcarthur Rossetti, PA-C  nicotine (NICODERM CQ - DOSED IN MG/24 HOURS) 14 mg/24hr patch 14 mg patch daily x2 weeks then  7 mg patch daily x3 weeks and stop 08/23/20   Angiulli, Mcarthur Rossetti, PA-C  oxyCODONE (OXY IR/ROXICODONE) 5 MG immediate release tablet Take 1 tablet (5 mg total) by mouth every 8 (eight) hours as needed for severe pain. Patient not taking: Reported on 08/30/2020 08/23/20   Charlton Amor, PA-C  rosuvastatin (CRESTOR) 40 MG tablet Take 1 tablet (40 mg total) by mouth daily. 08/23/20   Angiulli, Mcarthur Rossetti, PA-C  ticagrelor (BRILINTA) 90 MG TABS tablet Take 1 tablet (90 mg total) by mouth 2 (two) times daily. 08/23/20   Angiulli, Mcarthur Rossetti, PA-C    Family History History reviewed. No pertinent family history.  Social History Social History   Tobacco Use   Smoking status: Every Day    Pack years: 0.00   Smokeless tobacco: Never  Vaping Use   Vaping Use: Unknown  Substance Use Topics   Alcohol use: Not Currently   Drug use: Not Currently     Allergies   Beef-derived products, Pork-derived products, and Poultry meal   Review of Systems Review of Systems  Constitutional:  Negative for activity change, appetite change, chills, fatigue and fever.  Respiratory:  Negative for chest tightness and shortness of breath.   Cardiovascular:  Negative for chest pain.  Gastrointestinal:  Positive for abdominal pain (left lower quadrant). Negative for blood in stool, constipation, diarrhea, nausea and vomiting.  Genitourinary:  Positive for frequency, scrotal swelling and testicular pain. Negative for decreased urine volume, difficulty urinating, dysuria, flank pain, genital sores, hematuria, penile discharge, penile pain, penile swelling and urgency.  Musculoskeletal:  Positive for arthralgias, back pain and gait problem (walks with cane).  Skin:  Positive for color change. Negative for rash and wound.  Allergic/Immunologic: Positive for food allergies. Negative for environmental allergies.  Neurological:  Negative for dizziness, tremors, seizures, syncope, facial asymmetry and speech difficulty.   Hematological:  Bruises/bleeds easily.    Physical Exam Triage Vital Signs ED Triage Vitals  Enc Vitals Group     BP 09/11/20 1938 108/75     Pulse Rate 09/11/20 1938 93     Resp 09/11/20 1938 17     Temp --      Temp src --      SpO2 09/11/20 1938 98 %     Weight --      Height --      Head Circumference --      Peak Flow --      Pain Score 09/11/20 1939 7     Pain Loc --      Pain Edu? --      Excl. in GC? --    No data found.  Updated Vital Signs BP 108/75 (BP Location: Left Arm)   Pulse 93   Resp 17   SpO2 98%   Visual Acuity Right Eye Distance:   Left Eye Distance:   Bilateral Distance:    Right Eye Near:   Left Eye Near:    Bilateral Near:     Physical Exam Vitals and nursing  note reviewed. Exam conducted with a chaperone present (Caregiver present during exam).  Constitutional:      General: He is awake. He is not in acute distress.    Appearance: He is well-groomed and underweight.     Comments: He is sitting on the exam table in no acute distress but appears uncomfortable due to pain.   HENT:     Head: Normocephalic and atraumatic.     Right Ear: Hearing normal.     Left Ear: Hearing normal.  Eyes:     Extraocular Movements: Extraocular movements intact.     Conjunctiva/sclera: Conjunctivae normal.  Cardiovascular:     Rate and Rhythm: Normal rate. Rhythm irregular.     Heart sounds: Murmur heard.  Pulmonary:     Effort: Pulmonary effort is normal. No respiratory distress.     Breath sounds: Normal breath sounds and air entry. No decreased air movement. No decreased breath sounds, wheezing, rhonchi or rales.  Abdominal:     General: Abdomen is flat. Bowel sounds are normal. There is no distension or abdominal bruit.     Palpations: Abdomen is soft. There is no hepatomegaly or splenomegaly.     Tenderness: There is abdominal tenderness in the left lower quadrant. There is no right CVA tenderness, left CVA tenderness or guarding.     Hernia:  There is no hernia in the right inguinal area.       Comments: Lower inguinal swelling and tenderness compared to right  Genitourinary:    Pubic Area: No rash.      Penis: Normal. No tenderness or discharge.      Testes:        Right: Mass, tenderness or swelling not present.        Left: Tenderness and swelling present.     Epididymis:     Right: Normal.     Left: Tenderness present.       Comments: Left scrotum swollen with left testicle firm, red, swollen and very tender. Right testicle normal size and soft.  Musculoskeletal:     Cervical back: Normal range of motion.  Lymphadenopathy:     Lower Body: No right inguinal adenopathy.  Skin:    General: Skin is warm and dry.     Capillary Refill: Capillary refill takes less than 2 seconds.     Findings: Erythema present. No rash.  Neurological:     General: No focal deficit present.     Mental Status: He is alert and oriented to person, place, and time.  Psychiatric:        Mood and Affect: Mood normal.        Behavior: Behavior normal. Behavior is cooperative.        Thought Content: Thought content normal.        Judgment: Judgment normal.     UC Treatments / Results  Labs (all labs ordered are listed, but only abnormal results are displayed) Labs Reviewed  POCT URINALYSIS DIPSTICK, ED / UC - Abnormal; Notable for the following components:      Result Value   Hgb urine dipstick MODERATE (*)    All other components within normal limits    EKG   Radiology No results found.  Procedures Procedures (including critical care time)  Medications Ordered in UC Medications - No data to display  Initial Impression / Assessment and Plan / UC Course  I have reviewed the triage vital signs and the nursing notes.  Pertinent labs & imaging results that were  available during my care of the patient were reviewed by me and considered in my medical decision making (see chart for details).    Reviewed urinalysis results with  patient and caregiver. Positive moderate amount of blood but otherwise normal/negative. No signs of UTI. Discussed left scrotal swelling and pain. Possible etiologies include testicular torsion, epidymidis, inguinal hernia causing blockage, cyst, blood clot or other etiology. Since patient does not have a PCP and his pain is worsening with increased testicular swelling, recommend go to the ER ASAP for further evaluation and imaging. Patient and caregiver understand and agree to go to [facility] ER now for further care.    Final Clinical Impressions(s) / UC Diagnoses   Final diagnoses:  Testicular swelling, left  Unilateral groin pain, left  Abdominal pain, left lower quadrant  Other microscopic hematuria     Discharge Instructions      Due to the swelling, redness and pain of your left testicle and left groin pain, along with microscopic blood in your urine, recommend further evaluation at the ER ASAP.      ED Prescriptions   None    PDMP not reviewed this encounter.   Sudie Grumbling, NP 09/11/20 2207

## 2020-09-11 NOTE — Discharge Instructions (Addendum)
Due to the swelling, redness and pain of your left testicle and left groin pain, along with microscopic blood in your urine, recommend further evaluation at the ER ASAP.

## 2020-09-11 NOTE — ED Provider Notes (Signed)
Emergency Medicine Provider Triage Evaluation Note  Vincent Horton , a 65 y.o. male  was evaluated in triage.  Pt complains of left groin pain.  Onset on Saturday.  Reports tenderness and pain.  Seen at [facility] and referred to ED.    Review of Systems  Positive: Testicle pain, swelling Negative: Fever, vomiting  Physical Exam  BP 116/82 (BP Location: Right Arm)   Pulse (!) 102   Temp 98.2 F (36.8 C)   Resp 18   SpO2 99%  Gen:   Awake, no distress   Resp:  Normal effort  MSK:   Moves extremities without difficulty  Other:  Left testicle TTP  Medical Decision Making  Medically screening exam initiated at 11:35 PM.  Appropriate orders placed.  Vincent Horton was informed that the remainder of the evaluation will be completed by another provider, this initial triage assessment does not replace that evaluation, and the importance of remaining in the ED until their evaluation is complete.  Testicle pain   Felipa Furnace 09/11/20 2337    Dione Booze, MD 09/12/20 [phone]

## 2020-09-11 NOTE — ED Triage Notes (Signed)
Pt presents with c/o RLQ pain that moves to the back x 1 week. Pt states he is urinating a lot, he states he drinks a lot of water. Pt c/o groin pain.

## 2020-09-12 ENCOUNTER — Emergency Department (HOSPITAL_COMMUNITY): Payer: Medicare Other

## 2020-09-12 DIAGNOSIS — N451 Epididymitis: Secondary | ICD-10-CM | POA: Diagnosis not present

## 2020-09-12 LAB — CBC WITH DIFFERENTIAL/PLATELET
Abs Immature Granulocytes: 0.03 10*3/uL (ref 0.00–0.07)
Basophils Absolute: 0.1 10*3/uL (ref 0.0–0.1)
Basophils Relative: 1 %
Eosinophils Absolute: 0.5 10*3/uL (ref 0.0–0.5)
Eosinophils Relative: 5 %
HCT: 39.3 % (ref 39.0–52.0)
Hemoglobin: 12.3 g/dL — ABNORMAL LOW (ref 13.0–17.0)
Immature Granulocytes: 0 %
Lymphocytes Relative: 35 %
Lymphs Abs: 3.2 10*3/uL (ref 0.7–4.0)
MCH: 30.7 pg (ref 26.0–34.0)
MCHC: 31.3 g/dL (ref 30.0–36.0)
MCV: 98 fL (ref 80.0–100.0)
Monocytes Absolute: 0.5 10*3/uL (ref 0.1–1.0)
Monocytes Relative: 6 %
Neutro Abs: 4.9 10*3/uL (ref 1.7–7.7)
Neutrophils Relative %: 53 %
Platelets: 279 10*3/uL (ref 150–400)
RBC: 4.01 MIL/uL — ABNORMAL LOW (ref 4.22–5.81)
RDW: 15.7 % — ABNORMAL HIGH (ref 11.5–15.5)
WBC: 9.1 10*3/uL (ref 4.0–10.5)
nRBC: 0 % (ref 0.0–0.2)

## 2020-09-12 LAB — BASIC METABOLIC PANEL
Anion gap: 8 (ref 5–15)
BUN: 7 mg/dL — ABNORMAL LOW (ref 8–23)
CO2: 24 mmol/L (ref 22–32)
Calcium: 8.9 mg/dL (ref 8.9–10.3)
Chloride: 103 mmol/L (ref 98–111)
Creatinine, Ser: 0.64 mg/dL (ref 0.61–1.24)
GFR, Estimated: >60 mL/min (ref >60)
Glucose, Bld: 193 mg/dL — ABNORMAL HIGH (ref 70–99)
Potassium: 4 mmol/L (ref 3.5–5.1)
Sodium: 135 mmol/L (ref 135–145)

## 2020-09-12 MED ORDER — CIPROFLOXACIN HCL 500 MG PO TABS: 500.0000 mg | ORAL_TABLET | Freq: Two times a day (BID) | ORAL | 0 refills | Status: AC

## 2020-09-12 NOTE — ED Provider Notes (Signed)
Medical screening examination/treatment/procedure(s) were conducted as a shared visit with non-physician practitioner(s) and myself.  I personally evaluated the patient during the encounter.  Clinical Impression:   Final diagnoses:  None      This patient presents with left-sided testicular pain, on exam he has normal-appearing right-sided testicle, his scrotum is normal without redness or tenderness, he has no inguinal lymphadenopathy, the left testicle is tender, slightly enlarged but has a normal lie in the scrotum.  Normal cremasteric reflex  Nontender abdomen  Vital signs unremarkable, ultrasound shows epididymitis, antibiotics at home, patient agreed   Vincent Hong, MD 09/12/20 1943

## 2020-09-12 NOTE — Discharge Instructions (Addendum)
Take the medications as prescribed  Return for new or worsening symptoms  Follow up with Urology if symptoms do not imrpve

## 2020-09-12 NOTE — ED Provider Notes (Addendum)
[facility] EMERGENCY DEPARTMENT Provider Note   CSN: [id number] Arrival date & time: 09/11/20  2145    History Chief Complaint  Patient presents with   Groin Swelling    Jeran Paff is a 65 y.o. male with past medical history significant for cardiac arrest, CAD, MI, CVA who presents for evaluation of left testicular pain.  Began 2 days ago.  Has noted some redness and some swelling.  Pain does not extend into his abdomen.  No dysuria, hematuria that he has noticed.  No fever, chills, nausea, vomiting, chest pain, shortness of breath, diarrhea, constipation, lower extremity swelling.  He is not sexually active.  Denies additional aggravating or alleviating factors.  Rates his current pain a 4/10.  Is not anything for pain at this time.  History obtained from patient, family in room and past medical records.  Medical Somali interpreter was used.  HPI     Past Medical History:  Diagnosis Date   Cardiac arrest [facility])    Coronary artery disease    Dysrhythmia    Myocardial infarction [facility])     Patient Active Problem List   Diagnosis Date Noted   CVA (cerebral vascular accident) (HCC) 08/10/2020   Pleural effusion    Protein-calorie malnutrition, severe 07/25/2020   Acute hypoxemic respiratory failure (HCC) 07/24/2020   Ischemic cardiomyopathy 07/24/2020   Aspiration pneumonia (HCC) 07/24/2020   Pulmonary edema cardiac cause (HCC) 07/24/2020   Acute kidney injury (HCC) 07/24/2020   Shock liver 07/24/2020   Cardiogenic shock (HCC) 07/24/2020   Transaminitis 07/24/2020   Protein calorie malnutrition (HCC) 07/23/2020   Acute ST elevation myocardial infarction (STEMI) due to occlusion of left anterior descending (LAD) coronary artery (HCC) 07/21/2020   Cardiac arrest (HCC) 07/21/2020   STEMI involving left anterior descending coronary artery (HCC) 07/21/2020    Past Surgical History:  Procedure Laterality Date   CARDIAC CATHETERIZATION     CORONARY/GRAFT  ACUTE MI REVASCULARIZATION N/A 07/21/2020   Procedure: Coronary/Graft Acute MI Revascularization;  Surgeon: Swaziland, Peter M, MD;  Location: [facility] INVASIVE CV LAB;  Service: Cardiovascular;  Laterality: N/A;   LEFT HEART CATH AND CORONARY ANGIOGRAPHY N/A 07/21/2020   Procedure: LEFT HEART CATH AND CORONARY ANGIOGRAPHY;  Surgeon: Swaziland, Peter M, MD;  Location: [facility] INVASIVE CV LAB;  Service: Cardiovascular;  Laterality: N/A;   RIGHT HEART CATH N/A 07/21/2020   Procedure: RIGHT HEART CATH;  Surgeon: Swaziland, Peter M, MD;  Location: [facility] INVASIVE CV LAB;  Service: Cardiovascular;  Laterality: N/A;       No family history on file.  Social History   Tobacco Use   Smoking status: Every Day    Pack years: 0.00   Smokeless tobacco: Never  Vaping Use   Vaping Use: Unknown  Substance Use Topics   Alcohol use: Not Currently   Drug use: Not Currently    Home Medications Prior to Admission medications   Medication Sig Start Date End Date Taking? Authorizing Provider  ciprofloxacin (CIPRO) 500 MG tablet Take 1 tablet (500 mg total) by mouth every 12 (twelve) hours for 7 days. 09/12/20 09/19/20 Yes Lisseth Brazeau A, PA-C  acetaminophen (TYLENOL) 160 MG/5ML solution Take 20.3 mLs (650 mg total) by mouth every 6 (six) hours as needed for fever. 08/23/20   Angiulli, Mcarthur Rossetti, PA-C  aspirin 81 MG chewable tablet Chew 1 tablet (81 mg total) by mouth daily. 08/23/20   Angiulli, Mcarthur Rossetti, PA-C  cephALEXin (KEFLEX) 250 MG capsule Take 1 capsule (250 mg total)  by mouth every 8 (eight) hours. 08/24/20   Angiulli, Mcarthur Rossetti, PA-C  clonazePAM (KLONOPIN) 0.5 MG disintegrating tablet Take 1 tablet (0.5 mg total) by mouth 3 (three) times daily. 08/23/20   Angiulli, Mcarthur Rossetti, PA-C  digoxin (LANOXIN) 0.125 MG tablet Take 1 tablet (0.125 mg total) by mouth daily. 08/23/20   Angiulli, Mcarthur Rossetti, PA-C  nicotine (NICODERM CQ - DOSED IN MG/24 HOURS) 14 mg/24hr patch 14 mg patch daily x2 weeks then 7 mg patch daily x3 weeks and stop  08/23/20   Angiulli, Mcarthur Rossetti, PA-C  oxyCODONE (OXY IR/ROXICODONE) 5 MG immediate release tablet Take 1 tablet (5 mg total) by mouth every 8 (eight) hours as needed for severe pain. Patient not taking: Reported on 08/30/2020 08/23/20   Charlton Amor, PA-C  rosuvastatin (CRESTOR) 40 MG tablet Take 1 tablet (40 mg total) by mouth daily. 08/23/20   Angiulli, Mcarthur Rossetti, PA-C  ticagrelor (BRILINTA) 90 MG TABS tablet Take 1 tablet (90 mg total) by mouth 2 (two) times daily. 08/23/20   Angiulli, Mcarthur Rossetti, PA-C    Allergies    Beef-derived products, Pork-derived products, and Poultry meal  Review of Systems   Review of Systems  Constitutional: Negative.   HENT: Negative.    Respiratory: Negative.    Cardiovascular: Negative.   Gastrointestinal: Negative.   Genitourinary:  Positive for hematuria, scrotal swelling and testicular pain. Negative for decreased urine volume, difficulty urinating, flank pain, frequency, genital sores, penile discharge, penile pain, penile swelling and urgency.  Musculoskeletal: Negative.   Skin: Negative.   Neurological: Negative.   All other systems reviewed and are negative.  Physical Exam Updated Vital Signs BP 104/74   Pulse 88   Temp 98.4 F (36.9 C) (Oral)   Resp 17   Ht 5\' 4"  (1.626 m)   Wt 47.6 kg   SpO2 99%   BMI 18.02 kg/m   Physical Exam Vitals and nursing note reviewed.  Constitutional:      General: He is not in acute distress.    Appearance: He is well-developed. He is not ill-appearing, toxic-appearing or diaphoretic.  HENT:     Head: Normocephalic and atraumatic.     Nose: Nose normal.     Mouth/Throat:     Mouth: Mucous membranes are moist.  Eyes:     Pupils: Pupils are equal, round, and reactive to light.  Cardiovascular:     Rate and Rhythm: Normal rate and regular rhythm.     Pulses: Normal pulses.     Heart sounds: Normal heart sounds.  Pulmonary:     Effort: Pulmonary effort is normal. No respiratory distress.     Breath  sounds: Normal breath sounds.  Abdominal:     General: Bowel sounds are normal. There is no distension.     Palpations: Abdomen is soft.  Genitourinary:    Comments: Declined male provider for exam, see attending exam Musculoskeletal:        General: Normal range of motion.     Cervical back: Normal range of motion and neck supple.  Skin:    General: Skin is warm and dry.  Neurological:     General: No focal deficit present.     Mental Status: He is alert and oriented to person, place, and time.    ED Results / Procedures / Treatments   Labs (all labs ordered are listed, but only abnormal results are displayed) Labs Reviewed  CBC WITH DIFFERENTIAL/PLATELET - Abnormal; Notable for the following components:  Result Value   RBC 4.01 (*)    Hemoglobin 12.3 (*)    RDW 15.7 (*)    All other components within normal limits  BASIC METABOLIC PANEL - Abnormal; Notable for the following components:   Glucose, Bld 193 (*)    BUN 7 (*)    All other components within normal limits  URINALYSIS, ROUTINE W REFLEX MICROSCOPIC    EKG None  Radiology CT Renal Stone Study  Result Date: 09/12/2020 CLINICAL DATA:  Groin pain/right lower quadrant pain having to the back for 1 week EXAM: CT ABDOMEN AND PELVIS WITHOUT CONTRAST TECHNIQUE: Multidetector CT imaging of the abdomen and pelvis was performed following the standard protocol without IV contrast. COMPARISON:  CT 02/23/2006 FINDINGS: Lower chest: Small to moderate right pleural effusion with some adjacent passive atelectasis. Lung bases are otherwise clear. Cardiac size is within normal limits. No pericardial effusion. Hepatobiliary: No visible focal liver lesion with limitations of this unenhanced CT. Smooth surface contour. Normal hepatic attenuation. Normal gallbladder and biliary tree. Pancreas: No pancreatic ductal dilatation or surrounding inflammatory changes. Spleen: Normal in size. No concerning splenic lesions. Adrenals/Urinary  Tract: No concerning adrenal nodules or masses. Kidneys are symmetric in size and normally located. No visible or contour deforming renal lesions. No urolithiasis or hydronephrosis. Mild bladder wall thickening accounting for the degree of distention. Right anterolateral bladder ear partially protruding into a right inguinal hernia. Stomach/Bowel: Challenging assessment of the bowel and mesentery given absence of intraperitoneal fat and lack of intravenous or enteric contrast media. Distal esophagus and stomach are unremarkable. Proximal small bowel has a fairly unremarkable appearance there is some notable fecalization throughout the distal small bowel loops however. This is in addition to a stool seen throughout the colon. No significant bowel wall thickening or evidence of high-grade obstruction is seen. The appendix is not well visualized. Vascular/Lymphatic: Atherosclerotic calcifications within the abdominal aorta and branch vessels. No aneurysm or ectasia. No enlarged abdominopelvic lymph nodes. Reproductive: Borderline prostatomegaly. No focal lesion. Coarse eccentric calcifications, typically benign. Seminal vesicles are unremarkable. Other: No abdominopelvic free air or fluid. No bowel containing hernia. Bilateral fat containing inguinal hernias with partial protrusion of the bladder into the right inguinal canal. Musculoskeletal: No acute osseous abnormality or suspicious osseous lesion. IMPRESSION: 1. Extensive fecalization of the distal small bowel contents with fecal material throughout the colon. Can reflect slowed intestinal transit/constipation without evidence of mechanical obstruction though assessment of the bowel is complicated in the absence of IV or enteric contrast media and a paucity of intraperitoneal fat. 2. Small to moderate right pleural effusion, etiology unclear. Adjacent passive atelectasis, underlying airspace disease is difficult to fully exclude though less favored. 3.  Circumferential bladder wall thickening and mild bladder distension, possibly related to some outlet obstruction given prostatomegaly. Correlate with urinalysis to exclude superimposed cystitis. Additional note made of a right bladder ear protruding into a otherwise fat containing inguinal hernia on the right. 4.  Aortic Atherosclerosis (ICD10-I70.0). Electronically Signed   By: Kreg Shropshire M.D.   On: 09/12/2020 01:38   US SCROTUM W/DOPPLER  Result Date: 09/12/2020 CLINICAL DATA:  Testicular pain EXAM: SCROTAL ULTRASOUND DOPPLER ULTRASOUND OF THE TESTICLES TECHNIQUE: Complete ultrasound examination of the testicles, epididymis, and other scrotal structures was performed. Color and spectral Doppler ultrasound were also utilized to evaluate blood flow to the testicles. COMPARISON:  None. FINDINGS: Right testicle Measurements: 3.0 x 2.3 x 2.5 cm. Uniform parenchyma. No mass or microlithiasis. Left testicle Measurements: 2.7 x 1.7 x 2.6  cm. Uniform parenchyma. No mass or microlithiasis. Increased color Doppler flow. Right epididymis:  Normal in size and appearance. Left epididymis: Thickened and heterogeneously hypoechoic appearance of the epididymis with markedly increased color Doppler flow. Hydrocele: Small to moderate left hydrocele. No worrisome septation or internal complexity. Physiologic right paratesticular fluid. Varicocele:  None visualized. Pulsed Doppler interrogation of both testes demonstrates normal low resistance arterial and venous waveforms bilaterally. IMPRESSION: Markedly enlarged, heterogeneous and hypoechoic appearance of the left epididymis with significantly increased color Doppler flow. Some mildly increased left testicular color flow is noted as well. Appearance is consistent with epididymo-orchitis. Electronically Signed   By: Kreg Shropshire M.D.   On: 09/12/2020 00:27    Procedures Procedures   Medications Ordered in ED Medications - No data to display  ED Course  I have reviewed  the triage vital signs and the nursing notes.  Pertinent labs & imaging results that were available during my care of the patient were reviewed by me and considered in my medical decision making (see chart for details).  65 year old here for evaluation of left-sided testicular pain.  He is afebrile, nonseptic, not ill-appearing.  No associated pain with bowel movements, dysuria, hematuria, abdominal pain or flank pain.  He appears otherwise well.  Did declined GU exam with myself requesting a male provider.  See attending note.  No concerns for any STDs, not currently sexually active. Ultrasound consistent with epididymitis.  CT scan did show some constipation.  No obvious hernias.  Does have extensive cardiac history with recent cardiac arrest however he denies any chest pain, shortness of breath, lower extremity edema.  Low suspicion for vascular aneurysm as cause of his groin pain.  Labs personally reviewed which not show any significant leukocytosis, hemoglobin 12.3,  Metabolic panel mild hyperglycemia at 193. UA from UC shows hematuria  Patient does not want anything for pain here in the emergency department.  DC home with antibiotics  The patient has been appropriately medically screened and/or stabilized in the ED. I have low suspicion for any other emergent medical condition which would require further screening, evaluation or treatment in the ED or require inpatient management.  Patient is hemodynamically stable and in no acute distress.  Patient able to ambulate in department prior to ED.  Evaluation does not show acute pathology that would require ongoing or additional emergent interventions while in the emergency department or further inpatient treatment.  I have discussed the diagnosis with the patient and answered all questions.  Pain is been managed while in the emergency department and patient has no further complaints prior to discharge.  Patient is comfortable with plan discussed in  room and is stable for discharge at this time.  I have discussed strict return precautions for returning to the emergency department.  Patient was encouraged to follow-up with PCP/specialist refer to at discharge.       MDM Rules/Calculators/A&P                           Final Clinical Impression(s) / ED Diagnoses Final diagnoses:  Epididymitis    Rx / DC Orders ED Discharge Orders          Ordered    ciprofloxacin (CIPRO) 500 MG tablet  Every 12 hours        09/12/20 0811             Valentina Alcoser A, PA-C 09/12/20 0816    Aisling Emigh A, PA-C 09/12/20 [id number]  Eber HongMiller, Brian, MD 09/12/20 [phone]

## 2020-09-13 ENCOUNTER — Ambulatory Visit (INDEPENDENT_AMBULATORY_CARE_PROVIDER_SITE_OTHER): Payer: Medicare Other | Admitting: Neurology

## 2020-09-13 ENCOUNTER — Encounter: Payer: Self-pay | Admitting: Neurology

## 2020-09-13 VITALS — BP 110/76 | HR 96 | Ht 64.0 in | Wt 109.2 lb

## 2020-09-13 DIAGNOSIS — E74819 Disorders of glucose transport, unspecified: Secondary | ICD-10-CM

## 2020-09-13 DIAGNOSIS — I6381 Other cerebral infarction due to occlusion or stenosis of small artery: Secondary | ICD-10-CM | POA: Diagnosis not present

## 2020-09-13 DIAGNOSIS — I469 Cardiac arrest, cause unspecified: Secondary | ICD-10-CM | POA: Diagnosis not present

## 2020-09-13 DIAGNOSIS — R413 Other amnesia: Secondary | ICD-10-CM

## 2020-09-13 NOTE — Progress Notes (Signed)
[facility] [address] [place]. [place] [address] [phone]       OFFICE CONSULT NOTE  [name] Date of Birth:  [birth year] Medical Record Number:  [id number]   Referring MD:  Deatra Ina, PA-c  Reason for Referral:  Stroke  HPI: Vincent Horton is a 65 year old [place] male seen today for initial office consultation visit for stroke and memory loss.  History is obtained from the patient and his son who is accompanying him who speaks fluent [place] and translates for him.  I have also reviewed electronic medical records and pertinent imaging films in PACS.  He presented on 07/21/2020 after being found unresponsive by family.  EMS on arrival noted him to be pulseless and apneic.  Initial rhythm identified was V. fib and he received a total of 5 shocks and 3 epi injections as well as 450 mg of amiodarone over 15 minutes of CPR before return of spontaneous circulation.  Patient was subsequently found to be agitated and hypertensive upon arrival to the ED and required sedation with Versed.  Admission chemistry was significant for troponin of 2747 and lactic acid of 3.7.  EKG showed ST segment changes in anterior lateral leads and he underwent emergent cardiac catheterization and required left anterior descending coronary artery stenting.  Echocardiogram showed ejection fraction of 30 to 35% with moderately decreased LV function.  Cranial CT scan showed no acute abnormality or cerebral edema.  There were changes of chronic small vessel disease.  He was kept in the ICU and required vasopressors for hypotension and respiratory failure needing ventilatory support.  He developed aspiration pneumonia and right pleural effusion requiring thoracocentesis with removal of 450 cc of fluid.  EEG done on 3 consecutive days showed moderate encephalopathy with 4 to 6 Hz mixed theta and delta slowing but no epileptiform activity was noted.  He was started on aspirin 81 and Brilinta for his coronary  stent.  Patient developed altered mental status and requiring MRI scan done on 08/07/2020 which showed tiny punctate left frontal white matter lacunar infarct but no evidence of hypoxic ischemic brain injury.  Patient subsequently was transferred to inpatient rehab and has now been discharged home.  According to the son is doing better his memory seems to have improved though he still has some short-term memory difficulties but these are not getting worse.  He has had no focal deficits from stroke.  He has quit smoking completely.  He is tolerating aspirin and Brilinta with only minor bruising and no bleeding.  His blood pressure is well controlled and today it is 110/76.  He is tolerating Crestor well without muscle aches and pains. ROS:   14 system review of systems is positive for cardiac arrest, loss of consciousness, confusion, memory loss and all other systems negative  PMH:  Past Medical History:  Diagnosis Date   Cardiac arrest [facility])    Coronary artery disease    Dysrhythmia    Myocardial infarction (HCC)    Stroke [facility])     Social History:  Social History   Socioeconomic History   Marital status: Widowed    Spouse name: Not on file   Number of children: Not on file   Years of education: Not on file   Highest education level: Not on file  Occupational History   Not on file  Tobacco Use   Smoking status: Former    Types: Cigarettes   Smokeless tobacco: Never  Vaping Use   Vaping Use: Unknown  Substance and Sexual Activity   Alcohol use: Not Currently   Drug use: Not Currently   Sexual activity: Not on file  Other Topics Concern   Not on file  Social History Narrative   Lives with his 4 daughters   Right Handed   Drinks 3-4 cups caffeine   Social Determinants of Health   Financial Resource Strain: Not on file  Food Insecurity: Not on file  Transportation Needs: Not on file  Physical Activity: Not on file  Stress: Not on file  Social Connections: Not on file   Intimate Partner Violence: Not on file    Medications:   Current Outpatient Medications on File Prior to Visit  Medication Sig Dispense Refill   acetaminophen (TYLENOL) 160 MG/5ML solution Take 20.3 mLs (650 mg total) by mouth every 6 (six) hours as needed for fever. 120 mL 0   aspirin 81 MG chewable tablet Chew 1 tablet (81 mg total) by mouth daily.     cephALEXin (KEFLEX) 250 MG capsule Take 1 capsule (250 mg total) by mouth every 8 (eight) hours. 18 capsule 8   ciprofloxacin (CIPRO) 500 MG tablet Take 1 tablet (500 mg total) by mouth every 12 (twelve) hours for 7 days. 14 tablet 0   clonazePAM (KLONOPIN) 0.5 MG disintegrating tablet Take 1 tablet (0.5 mg total) by mouth 3 (three) times daily. 60 tablet 0   digoxin (LANOXIN) 0.125 MG tablet Take 1 tablet (0.125 mg total) by mouth daily. 30 tablet 0   rosuvastatin (CRESTOR) 40 MG tablet Take 1 tablet (40 mg total) by mouth daily. 30 tablet 0   ticagrelor (BRILINTA) 90 MG TABS tablet Take 1 tablet (90 mg total) by mouth 2 (two) times daily. 60 tablet 0   nicotine (NICODERM CQ - DOSED IN MG/24 HOURS) 14 mg/24hr patch 14 mg patch daily x2 weeks then 7 mg patch daily x3 weeks and stop 28 patch 0   oxyCODONE (OXY IR/ROXICODONE) 5 MG immediate release tablet Take 1 tablet (5 mg total) by mouth every 8 (eight) hours as needed for severe pain. 30 tablet 0   No current facility-administered medications on file prior to visit.    Allergies:   Allergies  Allergen Reactions   Beef-Derived Products Other (See Comments)    Patient is Muslim - cannot get ANY beef, pork, or poultry products (food and medications included)   Pork-Derived Products Other (See Comments)    Patient is Muslim - cannot get ANY beef, pork, or poultry products (food and medications included)   Poultry Meal Other (See Comments)    Patient is Muslim - cannot get ANY beef, pork, or poultry products (food and medications included)    Physical Exam General: Frail malnourished  looking middle-aged African male, seated, in no evident distress Head: head normocephalic and atraumatic.   Neck: supple with no carotid or supraclavicular bruits Cardiovascular: regular rate and rhythm, no murmurs Musculoskeletal: no deformity Skin:  no rash/petichiae Vascular:  Normal pulses all extremities  Neurologic Exam Mental Status: Awake and fully alert. Oriented to place and time. Recent and remote memory intact. Attention span, concentration and fund of knowledge appropriate. Mood and affect appropriate.  Diminished recall 0/3.  Able to name only 6 animals that can walk on 4 legs.  Clock drawing 2/4.  Mini-Mental status exam not done due to language barrier Cranial Nerves: Fundoscopic exam reveals sharp disc margins. Pupils equal, briskly reactive to light. Extraocular movements full without nystagmus. Visual fields full to confrontation. Hearing intact. Facial  sensation intact. Face, tongue, palate moves normally and symmetrically.  Motor: Normal bulk and tone. Normal strength in all tested extremity muscles. Sensory.: intact to touch , pinprick , position and vibratory sensation.  Coordination: Rapid alternating movements normal in all extremities. Finger-to-nose and heel-to-shin performed accurately bilaterally. Gait and Station: Arises from chair without difficulty. Stance is normal. Gait demonstrates normal stride length and balance . Able to heel, toe and tandem walk with moderate t difficulty.  Reflexes: 1+ and symmetric. Toes downgoing.   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  Vascular risk factors of coronary artery disease, smoking, hyperlipidemia.     PLAN:I had a long discussion with the patient and his son regarding his episode of cardiac arrest and memory difficulties after that  and small silent lacunar infarct and answered questions.  I recommend he continue aspirin and Brilinta for stroke and cardiac prevention as well as maintain aggressive risk factor modification with strict control of hypertension, goal below 130/90, lipids with LDL cholesterol goal below 70 mg percent and diabetes with hemoglobin A1c goal below 6.5%.  Check lipid profile hemoglobin A1c and EEG and transcranial Doppler study.  I also advised him to increase participation in cognitively challenging activities like solving crossword puzzle, playing bridge and sodoku.  We also discussed memory compensation strategies.  Return for follow-up in 3 months or call earlier if necessary.  Greater than 50% time during this 45-minute consultation visit was spent on counseling and coordination of care about his memory loss following cardiac arrest and silent lacunar stroke and answering questions. Natali Lavallee,MD  Note: This document was prepared with digital dictation and possible smart phrase technology. Any transcriptional errors that result from this process are unintentional.

## 2020-09-13 NOTE — Patient Instructions (Signed)
I had a long discussion with the patient and his son regarding his episode of cardiac arrest and memory difficulties after that and small silent lacunar infarct and answered questions.  I recommend he continue aspirin and Brilinta for stroke and cardiac prevention as well as maintain aggressive risk factor modification with strict control of hypertension, goal below 130/90, lipids with LDL cholesterol goal below 70 mg percent and diabetes with hemoglobin A1c goal below 6.5%.  Check lipid profile hemoglobin A1c and EEG and transcranial Doppler study.  I also advised him to increase participation in cognitively challenging activities like solving crossword puzzle, playing bridge and sodoku.  We also discussed memory compensation strategies.  Return for follow-up in 3 months or call earlier if necessary. Memory Compensation Strategies  Use "WARM" strategy.  W= write it down  A= associate it  R= repeat it  M= make a mental note  2.   You can keep a Glass blower/designer.  Use a 3-ring notebook with sections for the following: calendar, important names and phone numbers,  medications, doctors' names/phone numbers, lists/reminders, and a section to journal what you did  each day.   3.    Use a calendar to write appointments down.  4.    Write yourself a schedule for the day.  This can be placed on the calendar or in a separate section of the Memory Notebook.  Keeping a  regular schedule can help memory.  5.    Use medication organizer with sections for each day or morning/evening pills.  You may need help loading it  6.    Keep a basket, or pegboard by the door.  Place items that you need to take out with you in the basket or on the pegboard.  You may also want to  include a message board for reminders.  7.    Use sticky notes.  Place sticky notes with reminders in a place where the task is performed.  For example: " turn off the  stove" placed by the stove, "lock the door" placed on the door at eye  level, " take your medications" on  the bathroom mirror or by the place where you normally take your medications.  8.    Use alarms/timers.  Use while cooking to remind yourself to check on food or as a reminder to take your medicine, or as a  reminder to make a call, or as a reminder to perform another task, etc.

## 2020-09-14 LAB — LIPID PANEL
Chol/HDL Ratio: 1.8 ratio (ref 0.0–5.0)
Cholesterol, Total: 126 mg/dL (ref 100–199)
HDL: 70 mg/dL (ref >39)
LDL Chol Calc (NIH): 40 mg/dL (ref 0–99)
Triglycerides: 80 mg/dL (ref 0–149)
VLDL Cholesterol Cal: 16 mg/dL (ref 5–40)

## 2020-09-14 LAB — HEMOGLOBIN A1C
Est. average glucose Bld gHb Est-mCnc: 123 mg/dL
Hgb A1c MFr Bld: 5.9 % — ABNORMAL HIGH (ref 4.8–5.6)

## 2020-09-17 ENCOUNTER — Other Ambulatory Visit: Payer: Self-pay

## 2020-09-17 ENCOUNTER — Ambulatory Visit: Payer: Medicare Other | Admitting: Speech Pathology

## 2020-09-17 ENCOUNTER — Ambulatory Visit: Payer: Medicare Other | Admitting: Occupational Therapy

## 2020-09-17 ENCOUNTER — Ambulatory Visit: Payer: Medicare Other | Attending: Physician Assistant

## 2020-09-17 DIAGNOSIS — I69318 Other symptoms and signs involving cognitive functions following cerebral infarction: Secondary | ICD-10-CM

## 2020-09-17 DIAGNOSIS — I6300 Cerebral infarction due to thrombosis of unspecified precerebral artery: Secondary | ICD-10-CM | POA: Insufficient documentation

## 2020-09-17 DIAGNOSIS — R2689 Other abnormalities of gait and mobility: Secondary | ICD-10-CM | POA: Diagnosis present

## 2020-09-17 DIAGNOSIS — M6281 Muscle weakness (generalized): Secondary | ICD-10-CM | POA: Insufficient documentation

## 2020-09-17 DIAGNOSIS — R1312 Dysphagia, oropharyngeal phase: Secondary | ICD-10-CM | POA: Insufficient documentation

## 2020-09-17 DIAGNOSIS — R41841 Cognitive communication deficit: Secondary | ICD-10-CM

## 2020-09-17 DIAGNOSIS — R2681 Unsteadiness on feet: Secondary | ICD-10-CM | POA: Insufficient documentation

## 2020-09-17 NOTE — Therapy (Signed)
[facility] [address] [place], [place], [address] Phone: [phone]   Fax:  [phone]  Speech Language Pathology Evaluation  Patient Details  Name: Vincent Horton MRN: [id number] Date of Birth: Jun 10, 1955 Referring Provider (SLP): Dr. Claudette Laws   Encounter Date: 09/17/2020   End of Session - 09/17/20 1507     Visit Number 1    Number of Visits 10    Date for SLP Re-Evaluation 10/29/20    SLP Start Time 1402    SLP Stop Time  1448    SLP Time Calculation (min) 46 min    Activity Tolerance Patient tolerated treatment well             Past Medical History:  Diagnosis Date   Cardiac arrest [facility])    Coronary artery disease    Dysrhythmia    Myocardial infarction [facility])    Stroke [facility])     Past Surgical History:  Procedure Laterality Date   CARDIAC CATHETERIZATION     CORONARY/GRAFT ACUTE MI REVASCULARIZATION N/A 07/21/2020   Procedure: Coronary/Graft Acute MI Revascularization;  Surgeon: Swaziland, Peter M, MD;  Location: [facility] INVASIVE CV LAB;  Service: Cardiovascular;  Laterality: N/A;   LEFT HEART CATH AND CORONARY ANGIOGRAPHY N/A 07/21/2020   Procedure: LEFT HEART CATH AND CORONARY ANGIOGRAPHY;  Surgeon: Swaziland, Peter M, MD;  Location: [facility] INVASIVE CV LAB;  Service: Cardiovascular;  Laterality: N/A;   RIGHT HEART CATH N/A 07/21/2020   Procedure: RIGHT HEART CATH;  Surgeon: Swaziland, Peter M, MD;  Location: [facility] INVASIVE CV LAB;  Service: Cardiovascular;  Laterality: N/A;    There were no vitals filed for this visit.   Subjective Assessment - 09/17/20 1410     Subjective "I go to therapy"    Patient is accompained by: Family member   Vincent Horton   Currently in Pain? No/denies                SLP Evaluation [facility] - 09/17/20 1410       SLP Visit Information   SLP Received On 09/17/20    Referring Provider (SLP) Dr. Claudette Laws    Onset Date 07/21/20    Medical Diagnosis CVA      Subjective    Subjective "He said wait 3 weeks to drive, it has been 3 weeks, Can I drive?    Patient/Family Stated Goal to drive      General Information   HPI Pt is a 65 yo male found down by family VF arrest associated with STEMI, shocked 5x, 15 min CPR before ROSC achieved. ETT 5/21-5/31. CXR 5/31 with persistent bibasilar infiltrates/edema. PMH includes cardiac arrest, CAD, dysrhythmia, myocardial infarction. Per pt's son, pt's oral intake at home was limited to very soft and/or liquidised foods PTA due to condition of dentition. D/c'd on dys 2 and thin liquids. MBSS 08/22/20 revealed oral dysphagia with weak lingual bolus control and reduced mastication due to poor dentition and weakness    Mobility Status has cane, did not use it to walk - PT eval today      Prior Functional Status   Cognitive/Linguistic Baseline Within functional limits    Type of Home House     Lives With Family      Cognition   Overall Cognitive Status Impaired/Different from baseline    Area of Impairment Safety/judgement;Awareness;Problem solving;Memory    Attention Sustained    Memory Impaired    Memory Impairment Storage deficit    Awareness Impaired  Awareness Impairment Intellectual impairment    Problem Solving Impaired    Problem Solving Impairment Verbal complex;Functional complex    Company secretary Comprehension   Overall Auditory Comprehension Appears within functional limits for tasks assessed      Verbal Expression   Overall Verbal Expression Appears within functional limits for tasks assessed   daughter and patient deny speech/lang difficulties. He communicated with interpreter without difficulty     Written Expression   Dominant Hand Right    Written Expression Not tested      Oral Motor/Sensory Function   Overall Oral Motor/Sensory Function Appears within functional limits for tasks assessed      Motor Speech   Overall Motor Speech  Appears within functional limits for tasks assessed      Standardized Assessments   Standardized Assessments  --   subtests of CLQT                            SLP Education - 09/17/20 1507     Education Details wait to see Dr. on 7/29 to ask about driving    Person(s) Educated Child(ren)    Methods Explanation;Verbal cues    Comprehension Verbalized understanding;Verbal cues required              SLP Short Term Goals - 09/17/20 1520       SLP SHORT TERM GOAL #1   Title Complete clinical swallow evaluation    Time 1    Period Weeks    Status New      SLP SHORT TERM GOAL #2   Title Verbalize 2 cognitive impairments with rare min A    Time 3    Period Weeks    Status New      SLP SHORT TERM GOAL #3   Title ID safe vs unsafe decisions at home and with chores with occasional min A    Time 3    Period Weeks    Status New      SLP SHORT TERM GOAL #4   Title Complete med sorting task with occasional min A and ID any errors over 2 sessoins    Time 3    Period Weeks    Status New      SLP SHORT TERM GOAL #5   Title Follow swallow precautions and diet modifications with rare min A over 2 sessions    Time 3    Period Weeks    Status New              SLP Long Term Goals - 09/17/20 1529       SLP LONG TERM GOAL #1   Title Pt will manage either am or pm meds with supervision from family    Time 6    Period Weeks    Status New      SLP LONG TERM GOAL #2   Title Pt will carryover 3 strategies for attention, memory and self monitoring for 3 household chores with rare min A from family    Time 6    Period Weeks    Status New      SLP LONG TERM GOAL #3   Title Pt will follow diet modifications and swallow precautions with supervision cues    Time 6    Period Weeks    Status New  Plan - 09/17/20 1508     Clinical Impression Statement [name] is referred for outpt ST s/p left CVA due to cognitive linguistic  impairments. Today he is accompanied by his daughter, Vincent Horton. Vincent Horton denies cognitive changes and his daughter did not endorse any specific concerns except that they aren't comfortable leaving him home for a short period (30 min or less) becasue they fear he will "do to much."  Vincent Horton did report that her father will go for walks without telling them and this concerns her. His daughters are managing his medications at this time, as he has never taken meds before. They both deny any changes or difficulty in speech, language or word finding. The daughters manage finances and did this before the CVA. With an online interpreter, subtests of CLQT revealed some memory difficulties as Alexie reported he was 65 years old without awareness of error. He also 4/18 details on story recall and answered 3/6 yes no questions re: story, answering "yes" to all questions. When I questioned this, he stated "I got them all right" demonstrating reduced awareness of errors. On clock drawing, Vincent Horton put numbers 1-12 on along the right side of the clock and numbers 12-1 on the left side of the clock for a total of 24 numbers on his clock., When asked, he did not see this error. He had 1 hand pointing to 11. He required max A to be trained to trailmaking so the task was aborted. Vincent Horton did not complete simple maze despite studying it for over 3 minutes. Some language and cultural biases may affect his performance. With interpreter I educated him re: reduced awareness after a stroke. Clinical swallow eval not completed due to time constraints. Will complete next session. Recommend short course of ST to maximize cognition and advance diet as tolerated for safety and return to PLOF/house hold chores.    Speech Therapy Frequency 2x / week    Duration --   6 weeks or 12 visits   Treatment/Interventions Aspiration precaution training;Environmental controls;Cueing hierarchy;SLP instruction and feedback;Compensatory strategies;Functional  tasks;Cognitive reorganization;Compensatory techniques;Pharyngeal strengthening exercises;Diet toleration management by SLP;Trials of upgraded texture/liquids;Internal/external aids;Multimodal communcation approach;Patient/family education;Other (comment)   objective swallow study, MBSS or FEES as indicated   Potential to Achieve Goals Good             Patient will benefit from skilled therapeutic intervention in order to improve the following deficits and impairments:   Cognitive communication deficit  Dysphagia, oropharyngeal phase    Problem List Patient Active Problem List   Diagnosis Date Noted   CVA (cerebral vascular accident) (HCC) 08/10/2020   Pleural effusion    Protein-calorie malnutrition, severe 07/25/2020   Acute hypoxemic respiratory failure (HCC) 07/24/2020   Ischemic cardiomyopathy 07/24/2020   Aspiration pneumonia (HCC) 07/24/2020   Pulmonary edema cardiac cause (HCC) 07/24/2020   Acute kidney injury (HCC) 07/24/2020   Shock liver 07/24/2020   Cardiogenic shock (HCC) 07/24/2020   Transaminitis 07/24/2020   Protein calorie malnutrition (HCC) 07/23/2020   Acute ST elevation myocardial infarction (STEMI) due to occlusion of left anterior descending (LAD) coronary artery (HCC) 07/21/2020   Cardiac arrest (HCC) 07/21/2020   STEMI involving left anterior descending coronary artery (HCC) 07/21/2020    Neviah Braud, Radene Journey MS, CCC-SLP 09/17/2020, 3:31 PM  [facility] [address] [place], [place], [address] Phone: [phone]   Fax:  [phone]  Name: Vincent Horton MRN: [id number] Date of Birth: Sep 12, 1955

## 2020-09-17 NOTE — Therapy (Signed)
[facility] [address] [place], [place], [address] Phone: [phone]   Fax:  [phone]  Occupational Therapy Evaluation  Patient Details  Name: Vincent Horton MRN: [id number] Date of Birth: [birth year] No data recorded  Encounter Date: 09/17/2020   OT End of Session - 09/17/20 1409     Visit Number 1    Number of Visits 9    Date for OT Re-Evaluation 11/02/20    Authorization Type MCR/MCD    Progress Note Due on Visit 10    OT Start Time 1315    OT Stop Time 1400    OT Time Calculation (min) 45 min    Activity Tolerance Patient tolerated treatment well    Behavior During Therapy [facility] for tasks assessed/performed             Past Medical History:  Diagnosis Date   Cardiac arrest [facility])    Coronary artery disease    Dysrhythmia    Myocardial infarction (HCC)    Stroke [facility])     Past Surgical History:  Procedure Laterality Date   CARDIAC CATHETERIZATION     CORONARY/GRAFT ACUTE MI REVASCULARIZATION N/A 07/21/2020   Procedure: Coronary/Graft Acute MI Revascularization;  Surgeon: Swaziland, Peter M, MD;  Location: [facility] INVASIVE CV LAB;  Service: Cardiovascular;  Laterality: N/A;   LEFT HEART CATH AND CORONARY ANGIOGRAPHY N/A 07/21/2020   Procedure: LEFT HEART CATH AND CORONARY ANGIOGRAPHY;  Surgeon: Swaziland, Peter M, MD;  Location: [facility] INVASIVE CV LAB;  Service: Cardiovascular;  Laterality: N/A;   RIGHT HEART CATH N/A 07/21/2020   Procedure: RIGHT HEART CATH;  Surgeon: Swaziland, Peter M, MD;  Location: [facility] INVASIVE CV LAB;  Service: Cardiovascular;  Laterality: N/A;    There were no vitals filed for this visit.   Subjective Assessment - 09/17/20 1319     Patient is accompanied by: Family member   daughter   Pertinent History Pt is a 65 y/o male seen on 07/21/2020 for evaluation of STEMI. Upon EMS arrival, pt apneic and was in VFib and shocked 5 times with 15-min CPR before ROSC achieved. Pt intubated on site (5/21) and  transferred to [facility]. Recent CXR findings reveal diffuse bilateral pulmonary infiltrates/edema, progressed from  prior exam. Bibasilar atelectasis. Small right pleural effusion. Pt extubated on 07/31/2020. MRI done on 08/07/20 due to persistent AMS post cardiac arrest showing punctate acute to subacute Lt front infarct. Inpatient rehab 6/10 - 08/24/20. PMH includes cardiac arrest, CAD, dysrhythmia, myocardial infarction.    Currently in Pain? No/denies               [facility] OT Assessment - 09/17/20 0001       Assessment   Medical Diagnosis Lt frontal infarct   s/p cardiac arrest   Onset Date/Surgical Date 07/21/20    Hand Dominance Right    Prior Therapy Inpatient rehab      Precautions   Precaution Comments dysphagia #1 nectar thick liquids, no driving, no smoking or alcohol      Restrictions   Weight Bearing Restrictions No      Home  Environment   Bathroom Shower/Tub Tub/Shower unit    Additional Comments Pt lives with 3 daughters on first floor apt (one level) w/ 3 steps to enter    Lives With Family      Prior Function   Level of Independence Independent    Vocation Unemployed    Leisure fixing cars      ADL   Eating/Feeding Independent  Grooming Independent    Upper Body Bathing Independent    Lower Body Bathing Independent    Upper Body Dressing Independent    Lower Body Dressing Independent    Toilet Transfer Independent    Toileting - Clothing Manipulation Independent    Toileting -  Hygiene Independent    Tub/Shower Transfer Independent   ? safety     IADL   Shopping --   daughters do mostly, pt was able to drive and get groceries prior to but only did occasionally for smaller purchases   Light Housekeeping Does personal laundry completely    Meal Prep Able to complete simple cold meal and snack prep;Able to complete simple warm meal prep   pt has not returned to full cooking   Community Mobility Relies on family or friends for transportation    Medication  Management Has difficulty remembering to take medication   Daughters oversee     Mobility   Mobility Status Independent    Mobility Status Comments does not use quad cane      Written Expression   Dominant Hand Right    Handwriting 100% legible   print     Vision - History   Baseline Vision --   cataract surgery   Additional Comments denies change since cardiac arrest and stroke      Cognition   Overall Cognitive Status Impaired/Different from baseline    Cognition Comments Pt/daughter denies. Not formally assessed      Observation/Other Assessments   Observations Pt's daughter with him. Unsure how much [place] pt understands however daughter seems only to translate certain words in native Somali language. Pt denies deficits. Daughter reports he has not returned to cooking or driving      Sensation   Light Touch Appears Intact      Coordination   9 Hole Peg Test Right;Left    Right 9 Hole Peg Test 24.65 sec    Left 9 Hole Peg Test 24.72 sec      Edema   Edema none      Tone   Assessment Location --   normal     ROM / Strength   AROM / PROM / Strength AROM;Strength      AROM   Overall AROM Comments BUE AROM WNL'S      Strength   Overall Strength Comments BUE MMT grossly 5/5      Hand Function   Right Hand Grip (lbs) 66.3 lbs    Left Hand Grip (lbs) 75.6 lbs                               OT Short Term Goals - 09/17/20 1416       OT SHORT TERM GOAL #1   Title STG's = LTG's               OT Long Term Goals - 09/17/20 1416       OT LONG TERM GOAL #1   Title Independent with putty HEP for Rt hand    Time 6    Period Weeks    Status New      OT LONG TERM GOAL #2   Title Environmental scanning with 80% accuracy or higher    Time 6    Period Weeks    Status New      OT LONG TERM GOAL #3   Title Pt to return to simple cooking w/ distant supervision  Time 6    Period Weeks    Status New      OT LONG TERM GOAL #4   Title  Pt to participate in medication managment with supervision    Time 6    Period Weeks    Status New                   Plan - 09/17/20 1412     Clinical Impression Statement Pt is a 65 y/o male seen on 07/21/2020 for evaluation of STEMI. Upon EMS arrival, pt apneic and was in VFib and shocked 5 times with 15-min CPR before ROSC achieved. Pt intubated on site (5/21) and transferred to [facility]. Recent CXR findings reveal diffuse bilateral pulmonary infiltrates/edema, progressed from  prior exam. Bibasilar atelectasis. Small right pleural effusion. Pt extubated on 07/31/2020. MRI done on 08/07/20 due to persistent AMS post cardiac arrest showing punctate acute to subacute Lt front infarct. Inpatient rehab 6/10 - 08/24/20. PMH includes cardiac arrest, CAD, dysrhythmia, myocardial infarction. Pt now presents to OPOT for evaluation with decreased strength, decreased cognition and safety, and balance deficits and would benefit from O.T. to address these deficits.    OT Occupational Profile and History Detailed Assessment- Review of Records and additional review of physical, cognitive, psychosocial history related to current functional performance    Occupational performance deficits (Please refer to evaluation for details): ADL's;IADL's;Leisure    Body Structure / Function / Physical Skills Dexterity;Strength;UE functional use;IADL;Coordination;Mobility    Cognitive Skills Safety Awareness;Problem Solve;Memory    Rehab Potential Good    Clinical Decision Making Several treatment options, min-mod task modification necessary    Comorbidities Affecting Occupational Performance: May have comorbidities impacting occupational performance    Modification or Assistance to Complete Evaluation  Min-Moderate modification of tasks or assist with assess necessary to complete eval    OT Frequency 2x / week    OT Duration 4 weeks   or 8 visits over 6 weeks (d/t scheduling conflicts)   OT Treatment/Interventions  Self-care/ADL training;DME and/or AE instruction;Therapeutic activities;Therapeutic exercise;Cognitive remediation/compensation;Functional Mobility Training;Neuromuscular education;Patient/family education    Plan putty HEP, environmental scanning    Consulted and Agree with Plan of Care Patient;Family member/caregiver    Family Member Consulted daughter             Patient will benefit from skilled therapeutic intervention in order to improve the following deficits and impairments:   Body Structure / Function / Physical Skills: Dexterity, Strength, UE functional use, IADL, Coordination, Mobility Cognitive Skills: Safety Awareness, Problem Solve, Memory     Visit Diagnosis: Other symptoms and signs involving cognitive functions following cerebral infarction  Muscle weakness (generalized)    Problem List Patient Active Problem List   Diagnosis Date Noted   CVA (cerebral vascular accident) (HCC) 08/10/2020   Pleural effusion    Protein-calorie malnutrition, severe 07/25/2020   Acute hypoxemic respiratory failure (HCC) 07/24/2020   Ischemic cardiomyopathy 07/24/2020   Aspiration pneumonia (HCC) 07/24/2020   Pulmonary edema cardiac cause (HCC) 07/24/2020   Acute kidney injury (HCC) 07/24/2020   Shock liver 07/24/2020   Cardiogenic shock (HCC) 07/24/2020   Transaminitis 07/24/2020   Protein calorie malnutrition (HCC) 07/23/2020   Acute ST elevation myocardial infarction (STEMI) due to occlusion of left anterior descending (LAD) coronary artery (HCC) 07/21/2020   Cardiac arrest (HCC) 07/21/2020   STEMI involving left anterior descending coronary artery (HCC) 07/21/2020    Kelli Churn, OTR/L 09/17/2020, 2:18 PM  [facility] [phone]  [address] [place], [place], [address] Phone: [phone]   Fax:  [phone]  Name: Vincent Horton MRN: [id number] Date of Birth: [birth year]

## 2020-09-18 NOTE — Therapy (Signed)
[facility] [address] [place], [place], [address] Phone: [phone]   Fax:  [phone]  Physical Therapy Evaluation  Patient Details  Name: Vincent Horton MRN: [id number] Date of Birth: Jul 15, 1955 No data recorded  Encounter Date: 09/17/2020   PT End of Session - 09/17/20 1452     Visit Number 1    Number of Visits 5    Date for PT Re-Evaluation 10/22/20    Authorization Type MCR A&B    Progress Note Due on Visit 5    PT Start Time 1455    PT Stop Time 1525    PT Time Calculation (min) 30 min    Equipment Utilized During Treatment Gait belt    Activity Tolerance Patient tolerated treatment well    Behavior During Therapy WFL for tasks assessed/performed             Past Medical History:  Diagnosis Date   Cardiac arrest [facility])    Coronary artery disease    Dysrhythmia    Myocardial infarction (HCC)    Stroke [facility])     Past Surgical History:  Procedure Laterality Date   CARDIAC CATHETERIZATION     CORONARY/GRAFT ACUTE MI REVASCULARIZATION N/A 07/21/2020   Procedure: Coronary/Graft Acute MI Revascularization;  Surgeon: Swaziland, Peter M, MD;  Location: [facility] INVASIVE CV LAB;  Service: Cardiovascular;  Laterality: N/A;   LEFT HEART CATH AND CORONARY ANGIOGRAPHY N/A 07/21/2020   Procedure: LEFT HEART CATH AND CORONARY ANGIOGRAPHY;  Surgeon: Swaziland, Peter M, MD;  Location: [facility] INVASIVE CV LAB;  Service: Cardiovascular;  Laterality: N/A;   RIGHT HEART CATH N/A 07/21/2020   Procedure: RIGHT HEART CATH;  Surgeon: Swaziland, Peter M, MD;  Location: [facility] INVASIVE CV LAB;  Service: Cardiovascular;  Laterality: N/A;    There were no vitals filed for this visit.    Subjective Assessment - 09/17/20 1500     Subjective Reports he is 100%, using quad cane for mobility but can ambulate w/o it.  Denies pain, weakness or fatigue.    Patient is accompained by: Family member    Pertinent History Pt is a 65 y/o male seen on 07/21/2020  for evaluation of STEMI. Upon EMS arrival, pt apneic and was in VFib and shocked 5 times with 15-min CPR before ROSC achieved. Pt intubated on site (5/21) and transferred to [facility]. Recent CXR findings reveal diffuse bilateral pulmonary infiltrates/edema, progressed from  prior exam. Bibasilar atelectasis. Small right pleural effusion. Pt extubated on 07/31/2020. MRI done on 08/07/20 due to persistent AMS post cardiac arrest showing punctate acute to subacute Lt front infarct. Inpatient rehab 6/10 - 08/24/20. PMH includes cardiac arrest, CAD, dysrhythmia, myocardial infarction.    How long can you sit comfortably? >30 min    How long can you stand comfortably? >15 min    How long can you walk comfortably? > 30 min    Currently in Pain? No/denies                [facility] PT Assessment - 09/17/20 1454       Assessment   Medical Diagnosis L frontal Infarct s/p MI    Onset Date/Surgical Date 07/21/20    Hand Dominance Right    Prior Therapy CIR      Restrictions   Weight Bearing Restrictions No      Balance Screen   Has the patient fallen in the past 6 months No      Home Environment   Living Environment Private  residence    Living Arrangements Children    Type of Home Apartment    Home Access Stairs to enter    Entrance Stairs-Rails Right;Left    Home Layout One level    Home Equipment [place] - quad      Prior Function   Level of Independence Independent    Vocation Unemployed    Leisure fixing cars      Sensation   Light Touch Appears Intact      Coordination   Gross Motor Movements are Fluid and Coordinated Yes    Heel Shin Test WFL      ROM / Strength   AROM / PROM / Strength AROM;Strength      AROM   Overall AROM  Within functional limits for tasks performed    Overall AROM Comments BLEs WFL      Strength   Overall Strength Within functional limits for tasks performed      Transfers   Transfers Sit to Stand;Stand to Sit    Five time sit to stand comments  14.1s     Comments no need of UEs      Timed Up and Go Test   TUG Normal TUG    Normal TUG (seconds) 9.44    TUG Comments no AD      High Level Balance   High Level Balance Comments able to maintain all 4 positions on mCTSIB                        Objective measurements completed on examination: See above findings.               PT Education - 09/17/20 1504     Education Details Discussed eval findings, rehab potential and POC    Person(s) Educated Patient;Child(ren)    Methods Explanation    Comprehension Verbalized understanding              PT Short Term Goals - 09/18/20 0733       PT SHORT TERM GOAL #1   Title Instruct in HEP    Baseline TBD    Time 3    Period Weeks    Status New    Target Date 10/09/20      PT SHORT TERM GOAL #2   Title Assess FGA and set goal as needed.    Baseline TBD    Time 2    Period Weeks    Status New    Target Date 10/09/20               PT Long Term Goals - 09/18/20 0735       PT LONG TERM GOAL #1   Title Assess progress towards FGA.    Baseline TBD    Time 4    Period Days    Status New    Target Date 10/23/20      PT LONG TERM GOAL #2   Title I in floor transfers    Baseline TBD    Time 4    Period Weeks    Status New    Target Date 10/23/20      PT LONG TERM GOAL #3   Title Patient to demo 0.8 m/s gait speed    Baseline TBD    Time 4    Period Weeks    Status New    Target Date 10/23/20      PT LONG TERM GOAL #4   Title  Patient to negotiate full flight of steps with most appropriate pattern.    Baseline TBD    Time 4    Period Weeks    Status New    Target Date 10/23/20                    Plan - 09/17/20 1524     Clinical Impression Statement Patient referred to OPPT following MI leading to L cerbral infarct.  He lives with daughters and family in two story home.  Per his report he rates himself at fully recovered, transfers indpendently and ambulates with a [facility]  which he rarely uses.  He is able to transfer I, scores well on TUG, mCTSIB and 5x STS.  Despite speaking Somali, he is able to communicate and follow instructions.  Patient is a good candidate for PT services    Personal Factors and Comorbidities Comorbidity 1    Comorbidities L infarct s/p MI    Examination-Activity Limitations Stairs;Locomotion Level    Stability/Clinical Decision Making Stable/Uncomplicated    Clinical Decision Making Low    Rehab Potential Good    PT Frequency 1x / week    PT Duration 4 weeks    PT Treatment/Interventions ADLs/Self Care Home Management;Aquatic Therapy;DME Instruction;Gait training;Stair training;Functional mobility training;Therapeutic activities;Therapeutic exercise;Balance training;Neuromuscular re-education;Patient/family education    PT Next Visit Plan FGA and HEP    PT Home Exercise Plan TBD             Patient will benefit from skilled therapeutic intervention in order to improve the following deficits and impairments:  Abnormal gait, Difficulty walking, Decreased endurance, Decreased activity tolerance, Decreased balance, Decreased strength  Visit Diagnosis: Cerebrovascular accident (CVA) due to thrombosis of precerebral artery (HCC)  Unsteadiness on feet  Other abnormalities of gait and mobility     Problem List Patient Active Problem List   Diagnosis Date Noted   CVA (cerebral vascular accident) (HCC) 08/10/2020   Pleural effusion    Protein-calorie malnutrition, severe 07/25/2020   Acute hypoxemic respiratory failure (HCC) 07/24/2020   Ischemic cardiomyopathy 07/24/2020   Aspiration pneumonia (HCC) 07/24/2020   Pulmonary edema cardiac cause (HCC) 07/24/2020   Acute kidney injury (HCC) 07/24/2020   Shock liver 07/24/2020   Cardiogenic shock (HCC) 07/24/2020   Transaminitis 07/24/2020   Protein calorie malnutrition (HCC) 07/23/2020   Acute ST elevation myocardial infarction (STEMI) due to occlusion of left anterior  descending (LAD) coronary artery (HCC) 07/21/2020   Cardiac arrest (HCC) 07/21/2020   STEMI involving left anterior descending coronary artery (HCC) 07/21/2020    Hildred Laser PT 09/18/2020, 7:38 AM  [facility] [address] [place], [place], [address] Phone: [phone]   Fax:  [phone]  Name: Julus Kelley MRN: [id number] Date of Birth: 09/16/1955

## 2020-09-20 ENCOUNTER — Telehealth: Payer: Medicare Other | Admitting: Orthopedic Surgery

## 2020-09-20 DIAGNOSIS — I6381 Other cerebral infarction due to occlusion or stenosis of small artery: Secondary | ICD-10-CM | POA: Diagnosis not present

## 2020-09-20 DIAGNOSIS — R079 Chest pain, unspecified: Secondary | ICD-10-CM | POA: Diagnosis not present

## 2020-09-20 MED ORDER — DIGOXIN 125 MCG PO TABS: 125.0000 ug | ORAL_TABLET | Freq: Every day | ORAL | 0 refills | Status: DC

## 2020-09-20 NOTE — Progress Notes (Signed)
Virtual Visit Consent   Vincent Horton, you are scheduled for a virtual visit with a [facility] provider today.     Just as with appointments in the office, your consent must be obtained to participate.  Your consent will be active for this visit and any virtual visit you may have with one of our providers in the next 365 days.     If you have a MyChart account, a copy of this consent can be sent to you electronically.  All virtual visits are billed to your insurance company just like a traditional visit in the office.    As this is a virtual visit, video technology does not allow for your provider to perform a traditional examination.  This may limit your provider's ability to fully assess your condition.  If your provider identifies any concerns that need to be evaluated in person or the need to arrange testing (such as labs, EKG, etc.), we will make arrangements to do so.     Although advances in technology are sophisticated, we cannot ensure that it will always work on either your end or our end.  If the connection with a video visit is poor, the visit may have to be switched to a telephone visit.  With either a video or telephone visit, we are not always able to ensure that we have a secure connection.     I need to obtain your verbal consent now.   Are you willing to proceed with your visit today? Yes (via interpreter)   Kaelum Loughner has provided verbal consent on 09/20/2020 for a virtual visit (video or telephone).   Freeman Caldron, PA-C   Date: 09/20/2020 7:56 PM   Virtual Visit via Video Note   I, Freeman Caldron, connected with  Vincent Horton  ([id number], 10-27-1955) on 09/20/20 at  7:30 PM EDT by a video-enabled telemedicine application and verified that I am speaking with the correct person using two identifiers.  Location: Patient: Virtual Visit Location Patient: Home Provider: Virtual Visit Location Provider: Home Office   I discussed the limitations of evaluation and management  by telemedicine and the availability of in person appointments. The patient expressed understanding and agreed to proceed.    History of Present Illness: [name] is a 65 y.o. who identifies as a male who was assigned male at birth, and is being seen today for chest pain. Since discharge from the hospital he has had recurring bouts of mild CP that last 5 minutes or so and disappear with rest. It's unclear if it's exertional. He does not have a rx for nitrates so hasn't taken anything for the pain. It has not risen to the level of needing narcotics. He is also out of his Digoxin. He has an appt with cards in early September.   Problems:  Patient Active Problem List   Diagnosis Date Noted   CVA (cerebral vascular accident) (HCC) 08/10/2020   Pleural effusion    Protein-calorie malnutrition, severe 07/25/2020   Acute hypoxemic respiratory failure (HCC) 07/24/2020   Ischemic cardiomyopathy 07/24/2020   Aspiration pneumonia (HCC) 07/24/2020   Pulmonary edema cardiac cause (HCC) 07/24/2020   Acute kidney injury (HCC) 07/24/2020   Shock liver 07/24/2020   Cardiogenic shock (HCC) 07/24/2020   Transaminitis 07/24/2020   Protein calorie malnutrition (HCC) 07/23/2020   Acute ST elevation myocardial infarction (STEMI) due to occlusion of left anterior descending (LAD) coronary artery (HCC) 07/21/2020   Cardiac arrest (HCC) 07/21/2020   STEMI involving left  anterior descending coronary artery (HCC) 07/21/2020    Allergies:  Allergies  Allergen Reactions   Beef-Derived Products Other (See Comments)    Patient is Muslim - cannot get ANY beef, pork, or poultry products (food and medications included)   Pork-Derived Products Other (See Comments)    Patient is Muslim - cannot get ANY beef, pork, or poultry products (food and medications included)   Poultry Meal Other (See Comments)    Patient is Muslim - cannot get ANY beef, pork, or poultry products (food and medications included)   Medications:   Current Outpatient Medications:    acetaminophen (TYLENOL) 160 MG/5ML solution, Take 20.3 mLs (650 mg total) by mouth every 6 (six) hours as needed for fever., Disp: 120 mL, Rfl: 0   aspirin 81 MG chewable tablet, Chew 1 tablet (81 mg total) by mouth daily., Disp: , Rfl:    cephALEXin (KEFLEX) 250 MG capsule, Take 1 capsule (250 mg total) by mouth every 8 (eight) hours., Disp: 18 capsule, Rfl: 8   clonazePAM (KLONOPIN) 0.5 MG disintegrating tablet, Take 1 tablet (0.5 mg total) by mouth 3 (three) times daily., Disp: 60 tablet, Rfl: 0   digoxin (LANOXIN) 0.125 MG tablet, Take 1 tablet (0.125 mg total) by mouth daily., Disp: 30 tablet, Rfl: 0   nicotine (NICODERM CQ - DOSED IN MG/24 HOURS) 14 mg/24hr patch, 14 mg patch daily x2 weeks then 7 mg patch daily x3 weeks and stop, Disp: 28 patch, Rfl: 0   oxyCODONE (OXY IR/ROXICODONE) 5 MG immediate release tablet, Take 1 tablet (5 mg total) by mouth every 8 (eight) hours as needed for severe pain., Disp: 30 tablet, Rfl: 0   rosuvastatin (CRESTOR) 40 MG tablet, Take 1 tablet (40 mg total) by mouth daily., Disp: 30 tablet, Rfl: 0   ticagrelor (BRILINTA) 90 MG TABS tablet, Take 1 tablet (90 mg total) by mouth 2 (two) times daily., Disp: 60 tablet, Rfl: 0  Observations/Objective: Patient is well-developed, well-nourished in no acute distress.  Resting comfortably  at home.  Head is normocephalic, atraumatic.  No labored breathing.  Speech is clear and coherent with logical content.  Patient is alert and oriented at baseline.    Assessment and Plan: 1. Chest pain, unspecified type -- Advised call cards in am but be prepared to be directed to ED. If pain gets worse or doesn't relent seek care in ED tonight. Refilled digoxin.   Follow Up Instructions: I discussed the assessment and treatment plan with the patient. The patient was provided an opportunity to ask questions and all were answered. The patient agreed with the plan and demonstrated an  understanding of the instructions.  A copy of instructions were sent to the patient via MyChart.  The patient was advised to call back or seek an in-person evaluation if the symptoms worsen or if the condition fails to improve as anticipated.  Time:  I spent 15 minutes with the patient via telehealth technology discussing the above problems/concerns.    Freeman Caldron, PA-C

## 2020-09-24 ENCOUNTER — Telehealth: Payer: Self-pay | Admitting: Physical Medicine & Rehabilitation

## 2020-09-24 NOTE — Telephone Encounter (Signed)
Spoke to daughter Leanna Sato and she was asking about refill on Brilinta and Rosuvastatin. She states he is seeing Amy L. Zonia Kief tomorrow at [facility] at [facility] about his medication. I explained to her that Amy will go over his medication and possible bloodwork. PCP or Cardiologist should be the one to refill those medications. Ask her to contact Cardiologist about medications if PCP does not refill.

## 2020-09-24 NOTE — Telephone Encounter (Signed)
Maryfrances Bunnell family member needs to discuss regulation of the medications he was DC'd with - she needs to know if he is to continue - if so he will be out her number is [phone] 9:30 am

## 2020-09-25 ENCOUNTER — Other Ambulatory Visit: Payer: Self-pay

## 2020-09-25 ENCOUNTER — Ambulatory Visit (INDEPENDENT_AMBULATORY_CARE_PROVIDER_SITE_OTHER): Payer: Medicare Other | Admitting: Physician Assistant

## 2020-09-25 ENCOUNTER — Encounter: Payer: Self-pay | Admitting: Physician Assistant

## 2020-09-25 VITALS — BP 115/75 | HR 100 | Temp 98.5°F | Resp 18 | Ht 64.0 in | Wt 113.0 lb

## 2020-09-25 DIAGNOSIS — I252 Old myocardial infarction: Secondary | ICD-10-CM | POA: Diagnosis not present

## 2020-09-25 DIAGNOSIS — E782 Mixed hyperlipidemia: Secondary | ICD-10-CM

## 2020-09-25 DIAGNOSIS — Z8709 Personal history of other diseases of the respiratory system: Secondary | ICD-10-CM | POA: Diagnosis not present

## 2020-09-25 DIAGNOSIS — I255 Ischemic cardiomyopathy: Secondary | ICD-10-CM

## 2020-09-25 MED ORDER — TICAGRELOR 90 MG PO TABS: 90.0000 mg | ORAL_TABLET | Freq: Two times a day (BID) | ORAL | 1 refills | Status: DC

## 2020-09-25 MED ORDER — ROSUVASTATIN CALCIUM 40 MG PO TABS: 40.0000 mg | ORAL_TABLET | Freq: Every day | ORAL | 1 refills | Status: DC

## 2020-09-25 NOTE — Patient Instructions (Signed)
Please let us know if there is anything else we can do for you  Erricka Falkner S. Mayers, PA-C Physician Assistant [facility] [email or web address]   Health Maintenance, Male Adopting a healthy lifestyle and getting preventive care are important in promoting health and wellness. Ask your health care provider about: The right schedule for you to have regular tests and exams. Things you can do on your own to prevent diseases and keep yourself healthy. What should I know about diet, weight, and exercise? Eat a healthy diet  Eat a diet that includes plenty of vegetables, fruits, low-fat dairy products, and lean protein. Do not eat a lot of foods that are high in solid fats, added sugars, or sodium.  Maintain a healthy weight Body mass index (BMI) is a measurement that can be used to identify possible weight problems. It estimates body fat based on height and weight. Your health care provider can help determine your BMI and help you achieve or maintain ahealthy weight. Get regular exercise Get regular exercise. This is one of the most important things you can do for your health. Most adults should: Exercise for at least 150 minutes each week. The exercise should increase your heart rate and make you sweat (moderate-intensity exercise). Do strengthening exercises at least twice a week. This is in addition to the moderate-intensity exercise. Spend less time sitting. Even light physical activity can be beneficial. Watch cholesterol and blood lipids Have your blood tested for lipids and cholesterol at 66 years of age, then havethis test every 5 years. You may need to have your cholesterol levels checked more often if: Your lipid or cholesterol levels are high. You are older than 66 years of age. You are at high risk for heart disease. What should I know about cancer screening? Many types of cancers can be detected early and may often be prevented.  Depending on your health history and family history, you may need to have cancer screening at various ages. This may include screening for: Colorectal cancer. Prostate cancer. Skin cancer. Lung cancer. What should I know about heart disease, diabetes, and high blood pressure? Blood pressure and heart disease High blood pressure causes heart disease and increases the risk of stroke. This is more likely to develop in people who have high blood pressure readings, are of African descent, or are overweight. Talk with your health care provider about your target blood pressure readings. Have your blood pressure checked: Every 3-5 years if you are 18-39 years of age. Every year if you are 40 years old or older. If you are between the ages of 65 and 75 and are a current or former smoker, ask your health care provider if you should have a one-time screening for abdominal aortic aneurysm (AAA). Diabetes Have regular diabetes screenings. This checks your fasting blood sugar level. Have the screening done: Once every three years after age 45 if you are at a normal weight and have a low risk for diabetes. More often and at a younger age if you are overweight or have a high risk for diabetes. What should I know about preventing infection? Hepatitis B If you have a higher risk for hepatitis B, you should be screened for this virus. Talk with your health care provider to find out if you are at risk forhepatitis B infection. Hepatitis C Blood testing is recommended for: Everyone born from 1945 through 1965. Anyone with known risk factors for hepatitis C. Sexually transmitted infections (STIs) You should be screened each   year for STIs, including gonorrhea and chlamydia, if: You are sexually active and are younger than 66 years of age. You are older than 66 years of age and your health care provider tells you that you are at risk for this type of infection. Your sexual activity has changed since you were  last screened, and you are at increased risk for chlamydia or gonorrhea. Ask your health care provider if you are at risk. Ask your health care provider about whether you are at high risk for HIV. Your health care provider may recommend a prescription medicine to help prevent HIV infection. If you choose to take medicine to prevent HIV, you should first get tested for HIV. You should then be tested every 3 months for as long as you are taking the medicine. Follow these instructions at home: Lifestyle Do not use any products that contain nicotine or tobacco, such as cigarettes, e-cigarettes, and chewing tobacco. If you need help quitting, ask your health care provider. Do not use street drugs. Do not share needles. Ask your health care provider for help if you need support or information about quitting drugs. Alcohol use Do not drink alcohol if your health care provider tells you not to drink. If you drink alcohol: Limit how much you have to 0-2 drinks a day. Be aware of how much alcohol is in your drink. In the [place] one drink equals one 12 oz bottle of beer (355 mL), one 5 oz glass of wine (148 mL), or one 1 oz glass of hard liquor (44 mL). General instructions Schedule regular health, dental, and eye exams. Stay current with your vaccines. Tell your health care provider if: You often feel depressed. You have ever been abused or do not feel safe at home. Summary Adopting a healthy lifestyle and getting preventive care are important in promoting health and wellness. Follow your health care provider's instructions about healthy diet, exercising, and getting tested or screened for diseases. Follow your health care provider's instructions on monitoring your cholesterol and blood pressure. This information is not intended to replace advice given to you by your health care provider. Make sure you discuss any questions you have with your healthcare provider. Document Revised: 02/10/2018 Document  Reviewed: 02/10/2018 Elsevier Patient Education  2022 [employer].

## 2020-09-25 NOTE — Progress Notes (Signed)
New Patient Office Visit  Subjective:  Patient ID: Vincent Horton, male    DOB: 05-21-1955  Age: 65 y.o. MRN: [id number]  CC:  Chief Complaint  Patient presents with   Medication Refill    HPI Vincent Horton reports that he was hospitalized from June 10 through August 24, 2020.   Brief HPI:   Vincent Horton is a 65 y.o. right-handed male  Non-English-speaking Somali male with unknown medical history on no prescription medications.  Per chart review patient lives with his 3 daughters.  Independent prior to admission.  Presented 07/21/2020 after being found unresponsive by family.  EMS arrival patient pulseless and apneic.  Initially in V. fib total of 5 shocks 3 epis 450 mg amiodarone 15 minutes of CPR.  Patient agitated hypotensive upon arrival in the ED received Versed.  Admission chemistry glucose 275 creatinine 1.40 AST 328 ALT 251 hemoglobin A1c 6.5 troponin 2747 WBC 13,200 lactic acid 3.7.  EKG showed ST segment changes in anterior lateral leads and underwent emergent cardiac catheterization requiring LAD stenting.  Echocardiogram with ejection fraction of 30 to 35% left ventricle moderately decreased function.  Cranial CT scan negative for cerebral edema.  No evidence of anoxic brain injury.  Hypodensity in the deep white matter in the frontal lobes bilaterally likely due to chronic microvascular ischemia.  He did require vasopressors for hypotension.  Hospital course respiratory failure intubated treated for right side aspiration pneumonia as well as patient developed right pleural effusion undergoing thoracentesis 08/01/2020 with 450 cc fluid removal.  Follow-up chest x-ray 08/02/2020 without effusion.  EEG negative for seizure but did suggest severe diffuse encephalopathy.  Patient remained on low-dose aspirin therapy as well as the addition of Brilinta.  Subcutaneous Lovenox for DVT prophylaxis.  Initially n.p.o. with alternative means of nutritional support.  MRI of the brain completed 08/07/2020 due to  persistent altered mental status changes post cardiac arrest showing punctate acute to subacute left frontal infarct.  No evidence of hypoxic ischemic injury.  Patient remained on low dose aspirin Brilinta therapy for CVA prophylaxis.  Palliative care consulted to establish goals of care.  Therapy evaluations completed due to patient's altered mental status cardiac arrest decreased functional mobility was admitted for a comprehensive rehab program.   Hospital Course: Vincent Horton was admitted to rehab 08/10/2020 for inpatient therapies to consist of PT, ST and OT at least three hours five days a week. Past admission physiatrist, therapy team and rehab RN have worked together to provide customized collaborative inpatient rehab.  Pertaining to patient's debility related to CAD/anterior STEMI cardiogenic shock with occluded proximal LAD status post stenting post subacute left frontal lobe infarction remained stable and continued on aspirin as well as Brilinta.  Patient would follow-up cardiology service as well as neurology.  Bouts of anxiety Klonopin as indicated emotional support provided.  Hospital course acute hypoxic respiratory failure extubated suspect HCAP completed 7-day course of antibiotic.  Right pleural effusion status post thoracentesis 08/01/2020 450 cc fluid yield follow-up chest x-ray without effusion.  His diet was slowly advanced to a dysphagia #1 nectar thick liquid.  Blood pressure controlled on Coreg and monitored.  His blood pressures remain soft.  Crestor for hyperlipidemia.  Mild elevations in hemoglobin A1c 6.5 sliding scale insulin while initially in the hospital will need outpatient follow-up.  History of tobacco use NicoDerm patch patient received counsel regarding cessation of nicotine products as discussed with patient and family.  Patient with Citrobacter UTI placed on Keflex 08/22/2020.  No dysuria  noted.     Medications at discharge 1.  Tylenol as needed 2.  Klonopin 0.5 mg 3 times  daily 3.  Lanoxin 0.125 mg daily 4.  NicoDerm patch taper as directed 5.  Oxycodone 5 mg every 8 hours as needed pain 6.  Crestor 40 mg daily 7.  Brilinta 90 mg twice daily 8.  Keflex 250 mg every 8 hours x6 days  States today that he is doing well overall.  Has been continuing with outpatient rehab.  States that he has  an appointment on September 1 to establish care with a primary care provider at [facility].  Request medication refills.  Reports that he is using the Klonopin once daily at bedtime.  No other concerns at this time.  daughter is present.    Due to language barrier, an interpreter was present during the history-taking and subsequent discussion (and for part of the physical exam) with this patient.     Past Medical History:  Diagnosis Date   Cardiac arrest [facility])    Coronary artery disease    Dysrhythmia    Myocardial infarction [facility])    Stroke [facility])     Past Surgical History:  Procedure Laterality Date   CARDIAC CATHETERIZATION     CORONARY/GRAFT ACUTE MI REVASCULARIZATION N/A 07/21/2020   Procedure: Coronary/Graft Acute MI Revascularization;  Surgeon: Vincent Horton, Vincent M, MD;  Location: [facility] INVASIVE CV LAB;  Service: Cardiovascular;  Laterality: N/A;   LEFT HEART CATH AND CORONARY ANGIOGRAPHY N/A 07/21/2020   Procedure: LEFT HEART CATH AND CORONARY ANGIOGRAPHY;  Surgeon: Vincent Horton, Vincent M, MD;  Location: [facility] INVASIVE CV LAB;  Service: Cardiovascular;  Laterality: N/A;   RIGHT HEART CATH N/A 07/21/2020   Procedure: RIGHT HEART CATH;  Surgeon: Vincent Horton, Vincent M, MD;  Location: [facility] INVASIVE CV LAB;  Service: Cardiovascular;  Laterality: N/A;    History reviewed. No pertinent family history.  Social History   Socioeconomic History   Marital status: Widowed    Spouse name: Not on file   Number of children: Not on file   Years of education: Not on file   Highest education level: Not on file  Occupational History   Not on file  Tobacco Use   Smoking status:  Former    Types: Cigarettes   Smokeless tobacco: Never  Vaping Use   Vaping Use: Unknown  Substance and Sexual Activity   Alcohol use: Not Currently   Drug use: Not Currently   Sexual activity: Not on file  Other Topics Concern   Not on file  Social History Narrative   Lives with his 4 daughters   Right Handed   Drinks 3-4 cups caffeine   Social Determinants of Health   Financial Resource Strain: Not on file  Food Insecurity: Not on file  Transportation Needs: Not on file  Physical Activity: Not on file  Stress: Not on file  Social Connections: Not on file  Intimate Partner Violence: Not on file    ROS Review of Systems  Constitutional: Negative.   HENT: Negative.    Eyes: Negative.   Respiratory:  Negative for shortness of breath.   Cardiovascular:  Negative for chest pain.  Gastrointestinal: Negative.   Endocrine: Negative.   Genitourinary: Negative.   Musculoskeletal: Negative.   Skin: Negative.   Allergic/Immunologic: Negative.   Neurological: Negative.   Hematological: Negative.   Psychiatric/Behavioral: Negative.     Objective:   Today's Vitals: BP 115/75 (BP Location: Left Arm, Patient Position: Sitting, Cuff Size:  Normal)   Pulse 100   Temp 98.5 F (36.9 C) (Temporal)   Resp 18   Ht 5\' 4"  (1.626 Horton)   Wt 113 lb (51.3 kg)   SpO2 98%   BMI 19.40 kg/Horton   Physical Exam Vitals and nursing note reviewed.  Constitutional:      Appearance: Normal appearance.  HENT:     Head: Normocephalic and atraumatic.     Right Ear: External ear normal.     Left Ear: External ear normal.     Nose: Nose normal.     Mouth/Throat:     Mouth: Mucous membranes are moist.     Pharynx: Oropharynx is clear.  Eyes:     Extraocular Movements: Extraocular movements intact.     Conjunctiva/sclera: Conjunctivae normal.     Pupils: Pupils are equal, round, and reactive to light.  Cardiovascular:     Rate and Rhythm: Normal rate and regular rhythm.     Pulses: Normal  pulses.     Heart sounds: Normal heart sounds.  Pulmonary:     Effort: Pulmonary effort is normal.     Breath sounds: Normal breath sounds.  Musculoskeletal:        General: Normal range of motion.     Cervical back: Normal range of motion and neck supple.  Skin:    General: Skin is warm and dry.  Neurological:     General: No focal deficit present.     Mental Status: He is alert and oriented to person, place, and time.  Psychiatric:        Mood and Affect: Mood normal.        Behavior: Behavior normal.        Thought Content: Thought content normal.        Judgment: Judgment normal.    Assessment & Plan:   Problem List Items Addressed This Visit       Cardiovascular and Mediastinum   Ischemic cardiomyopathy   Relevant Medications   rosuvastatin (CRESTOR) 40 MG tablet   Other Visit Diagnoses     History of ST elevation myocardial infarction (STEMI)    -  Primary   Relevant Medications   rosuvastatin (CRESTOR) 40 MG tablet   ticagrelor (BRILINTA) 90 MG TABS tablet   History of respiratory failure       History of pleural effusion       Mixed hyperlipidemia       Relevant Medications   rosuvastatin (CRESTOR) 40 MG tablet       Outpatient Encounter Medications as of 09/25/2020  Medication Sig   acetaminophen (TYLENOL) 160 MG/5ML solution Take 20.3 mLs (650 mg total) by mouth every 6 (six) hours as needed for fever.   aspirin 81 MG chewable tablet Chew 1 tablet (81 mg total) by mouth daily.   cephALEXin (KEFLEX) 250 MG capsule Take 1 capsule (250 mg total) by mouth every 8 (eight) hours.   clonazePAM (KLONOPIN) 0.5 MG disintegrating tablet Take 1 tablet (0.5 mg total) by mouth 3 (three) times daily.   digoxin (LANOXIN) 0.125 MG tablet Take 1 tablet (125 mcg total) by mouth daily.   [DISCONTINUED] rosuvastatin (CRESTOR) 40 MG tablet Take 1 tablet (40 mg total) by mouth daily.   [DISCONTINUED] ticagrelor (BRILINTA) 90 MG TABS tablet Take 1 tablet (90 mg total) by mouth 2  (two) times daily.   nicotine (NICODERM CQ - DOSED IN MG/24 HOURS) 14 mg/24hr patch 14 mg patch daily x2 weeks then 7 mg patch daily x3  weeks and stop (Patient not taking: Reported on 09/25/2020)   oxyCODONE (OXY IR/ROXICODONE) 5 MG immediate release tablet Take 1 tablet (5 mg total) by mouth every 8 (eight) hours as needed for severe pain. (Patient not taking: Reported on 09/25/2020)   rosuvastatin (CRESTOR) 40 MG tablet Take 1 tablet (40 mg total) by mouth daily.   ticagrelor (BRILINTA) 90 MG TABS tablet Take 1 tablet (90 mg total) by mouth 2 (two) times daily.   No facility-administered encounter medications on file as of 09/25/2020.   1. History of ST elevation myocardial infarction (STEMI) Continue current regimen.  Patient has upcoming appointment to establish care [facility].  Continue follow-up with cardiology and neurology.  Red flags given for prompt reevaluation. - rosuvastatin (CRESTOR) 40 MG tablet; Take 1 tablet (40 mg total) by mouth daily.  Dispense: 30 tablet; Refill: 1 - ticagrelor (BRILINTA) 90 MG TABS tablet; Take 1 tablet (90 mg total) by mouth 2 (two) times daily.  Dispense: 60 tablet; Refill: 1  2. History of respiratory failure   3. History of pleural effusion   4. Mixed hyperlipidemia   5. Ischemic cardiomyopathy    I have reviewed the patient's medical history (PMH, PSH, Social History, Family History, Medications, and allergies) , and have been updated if relevant. I spent 31 minutes reviewing chart and  face to face time with patient.    Follow-up: Return in about 5 weeks (around 11/01/2020) for with Ricky Stabs, NP at [facility] at [facility].   Kasandra Knudsen Mayers, PA-C

## 2020-09-26 DIAGNOSIS — Z8709 Personal history of other diseases of the respiratory system: Secondary | ICD-10-CM | POA: Insufficient documentation

## 2020-09-26 DIAGNOSIS — E782 Mixed hyperlipidemia: Secondary | ICD-10-CM | POA: Insufficient documentation

## 2020-09-26 DIAGNOSIS — I252 Old myocardial infarction: Secondary | ICD-10-CM | POA: Insufficient documentation

## 2020-09-27 ENCOUNTER — Ambulatory Visit (HOSPITAL_COMMUNITY)
Admission: RE | Admit: 2020-09-27 | Discharge: 2020-09-27 | Disposition: A | Discharge status: Home / Self Care | Payer: Medicare Other | Source: Ambulatory Visit | Attending: Neurology | Admitting: Neurology

## 2020-09-27 DIAGNOSIS — I6381 Other cerebral infarction due to occlusion or stenosis of small artery: Secondary | ICD-10-CM | POA: Diagnosis not present

## 2020-09-27 NOTE — Progress Notes (Signed)
TCD completed. Refer to "CV Proc" under chart review to view preliminary results.  09/27/2020 1:22 PM Eula Fried., MHA, RVT, RDCS, RDMS

## 2020-09-28 ENCOUNTER — Encounter: Payer: Self-pay | Admitting: Physical Medicine & Rehabilitation

## 2020-09-28 ENCOUNTER — Encounter: Payer: Medicare Other | Attending: Registered Nurse | Admitting: Physical Medicine & Rehabilitation

## 2020-09-28 ENCOUNTER — Other Ambulatory Visit: Payer: Self-pay

## 2020-09-28 VITALS — BP 108/78 | HR 99 | Temp 98.2°F | Ht 64.0 in | Wt 113.0 lb

## 2020-09-28 DIAGNOSIS — I255 Ischemic cardiomyopathy: Secondary | ICD-10-CM | POA: Insufficient documentation

## 2020-09-28 NOTE — Progress Notes (Signed)
Subjective:    Patient ID: Vincent Horton, male    DOB: 08-04-1955, 65 y.o.   MRN: [id number]  65 y.o. right-handed male  Non-English-speaking Somali male with unknown medical history on no prescription medications.  Per chart review patient lives with his 3 daughters.  Independent prior to admission.  Presented 07/21/2020 after being found unresponsive by family.  EMS arrival patient pulseless and apneic.  Initially in V. fib total of 5 shocks 3 epis 450 mg amiodarone 15 minutes of CPR.  Patient agitated hypotensive upon arrival in the ED received Versed.  Admission chemistry glucose 275 creatinine 1.40 AST 328 ALT 251 hemoglobin A1c 6.5 troponin 2747 WBC 13,200 lactic acid 3.7.  EKG showed ST segment changes in anterior lateral leads and underwent emergent cardiac catheterization requiring LAD stenting.  Echocardiogram with ejection fraction of 30 to 35% left ventricle moderately decreased function.  Cranial CT scan negative for cerebral edema.  No evidence of anoxic brain injury.  Hypodensity in the deep white matter in the frontal lobes bilaterally likely due to chronic microvascular ischemia.  He did require vasopressors for hypotension.  Hospital course respiratory failure intubated treated for right side aspiration pneumonia as well as patient developed right pleural effusion undergoing thoracentesis 08/01/2020 with 450 cc fluid removal.  Follow-up chest x-ray 08/02/2020 without effusion.  EEG negative for seizure but did suggest severe diffuse encephalopathy.  Patient remained on low-dose aspirin therapy as well as the addition of Brilinta.  Subcutaneous Lovenox for DVT prophylaxis.  Initially n.p.o. with alternative means of nutritional support.  MRI of the brain completed 08/07/2020 due to persistent altered mental status changes post cardiac arrest showing punctate acute to subacute left frontal infarct.  No evidence of hypoxic ischemic injury.  Patient remained on low dose aspirin Brilinta therapy for CVA  prophylaxis.  Palliative care consulted to establish goals of care.  Therapy evaluations completed due to patient's altered mental status cardiac arrest decreased functional mobility was admitted for a comprehensive rehab program.   Admit date: 08/10/2020 Discharge date: 08/24/2020 HPI Since discharge from the rehab unit the patient has been staying with family in a 4 bedroom apartment.  He has 24/7 supervision.  He has been attending outpatient therapy.  Son-in-law is with him today.  The patient has not been driving but was driving prior to admission.  He would like to return to driving. The patient is now ambulating with a cane although in the house he does not use a cane.  He has followed up with cardiology as well as primary care and has neurology follow-up scheduled. No falls.  The patient is independent with dressing and bathing. Has some chest soreness on the right side.  No falls.  Did have prolonged CPR. The patient has quit smoking Pain Inventory Average Pain 0 Pain Right Now 0 My pain is  none  In the last 24 hours, has pain interfered with the following? General activity 0 Relation with others 0 Enjoyment of life 0 What TIME of day is your pain at its worst? none Sleep (in general) Good  Pain is worse with: walking Pain improves with:  ? Relief from Meds:  ?  History reviewed. No pertinent family history. Social History   Socioeconomic History   Marital status: Widowed    Spouse name: Not on file   Number of children: Not on file   Years of education: Not on file   Highest education level: Not on file  Occupational History   Not on  file  Tobacco Use   Smoking status: Former    Types: Cigarettes   Smokeless tobacco: Never  Vaping Use   Vaping Use: Unknown  Substance and Sexual Activity   Alcohol use: Not Currently   Drug use: Not Currently   Sexual activity: Not on file  Other Topics Concern   Not on file  Social History Narrative   Lives with his 4  daughters   Right Handed   Drinks 3-4 cups caffeine   Social Determinants of Health   Financial Resource Strain: Not on file  Food Insecurity: Not on file  Transportation Needs: Not on file  Physical Activity: Not on file  Stress: Not on file  Social Connections: Not on file   Past Surgical History:  Procedure Laterality Date   CARDIAC CATHETERIZATION     CORONARY/GRAFT ACUTE MI REVASCULARIZATION N/A 07/21/2020   Procedure: Coronary/Graft Acute MI Revascularization;  Surgeon: Swaziland, Peter M, MD;  Location: [facility] INVASIVE CV LAB;  Service: Cardiovascular;  Laterality: N/A;   LEFT HEART CATH AND CORONARY ANGIOGRAPHY N/A 07/21/2020   Procedure: LEFT HEART CATH AND CORONARY ANGIOGRAPHY;  Surgeon: Swaziland, Peter M, MD;  Location: [facility] INVASIVE CV LAB;  Service: Cardiovascular;  Laterality: N/A;   RIGHT HEART CATH N/A 07/21/2020   Procedure: RIGHT HEART CATH;  Surgeon: Swaziland, Peter M, MD;  Location: [facility] INVASIVE CV LAB;  Service: Cardiovascular;  Laterality: N/A;   Past Surgical History:  Procedure Laterality Date   CARDIAC CATHETERIZATION     CORONARY/GRAFT ACUTE MI REVASCULARIZATION N/A 07/21/2020   Procedure: Coronary/Graft Acute MI Revascularization;  Surgeon: Swaziland, Peter M, MD;  Location: [facility] INVASIVE CV LAB;  Service: Cardiovascular;  Laterality: N/A;   LEFT HEART CATH AND CORONARY ANGIOGRAPHY N/A 07/21/2020   Procedure: LEFT HEART CATH AND CORONARY ANGIOGRAPHY;  Surgeon: Swaziland, Peter M, MD;  Location: [facility] INVASIVE CV LAB;  Service: Cardiovascular;  Laterality: N/A;   RIGHT HEART CATH N/A 07/21/2020   Procedure: RIGHT HEART CATH;  Surgeon: Swaziland, Peter M, MD;  Location: [facility] INVASIVE CV LAB;  Service: Cardiovascular;  Laterality: N/A;   Past Medical History:  Diagnosis Date   Cardiac arrest [facility])    Coronary artery disease    Dysrhythmia    Myocardial infarction (HCC)    Stroke (HCC)    BP 108/78   Pulse 99   Temp 98.2 F (36.8 C)   Ht 5\' 4"  (1.626 m)   Wt 113 lb (51.3 kg)   SpO2  98%   BMI 19.40 kg/m   Opioid Risk Score:   Fall Risk Score:  `1  Depression screen PHQ 2/9  Depression screen PHQ 2/9 08/30/2020  Decreased Interest 1  Down, Depressed, Hopeless 0  PHQ - 2 Score 1  Altered sleeping 1  Tired, decreased energy 1  Change in appetite 0  Feeling bad or failure about yourself  0  Trouble concentrating 0  Moving slowly or fidgety/restless 0  Suicidal thoughts 0  PHQ-9 Score 3    Review of Systems  Constitutional: Negative.   HENT: Negative.    Eyes: Negative.   Respiratory: Negative.    Cardiovascular: Negative.   Gastrointestinal: Negative.   Endocrine: Negative.   Genitourinary: Negative.   Skin: Negative.   Allergic/Immunologic: Negative.   Neurological: Negative.   Hematological: Negative.   Psychiatric/Behavioral: Negative.    All other systems reviewed and are negative.     Objective:   Physical Exam Vitals and nursing note reviewed.  Constitutional:  Appearance: He is normal weight.  HENT:     Head: Normocephalic and atraumatic.  Eyes:     Extraocular Movements: Extraocular movements intact.     Conjunctiva/sclera: Conjunctivae normal.     Pupils: Pupils are equal, round, and reactive to light.  Musculoskeletal:     Right lower leg: No edema.     Left lower leg: No edema.     Comments: Tenderness palpation right lower sternal costal junction.  No mass palpated no erythema.  No pain with upper extremity range of motion at the shoulders elbows wrists or hands.  No pain with lower extremity range of motion or with weightbearing Cervical spine range of motion is normal  Skin:    General: Skin is warm and dry.  Neurological:     General: No focal deficit present.     Mental Status: He is alert and oriented to person, place, and time.     Cranial Nerves: No dysarthria.     Motor: No weakness or tremor.     Coordination: Coordination is intact.     Gait: Tandem walk normal.     Comments: Motor strength is 5/5 bilateral  deltoid, bicep, tricep, grip, hip flexor, knee extensor, ankle dorsiflexor and plantar flexor Negative straight leg raising bilaterally Tone is normal bilateral upper and lower limbs.  Psychiatric:        Mood and Affect: Mood normal.        Behavior: Behavior normal.          Assessment & Plan:   1.  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  Balance has improved. Has only 4 more therapy visits No longer reuires 24/7 sup  Need cardiac clearance to drive given hx of arrest Ok to drive from a functional perspective   F/u  PCP F/u Cardiology F/U neuro No PM&R f/u needed

## 2020-09-30 ENCOUNTER — Other Ambulatory Visit: Payer: Self-pay

## 2020-09-30 ENCOUNTER — Encounter (HOSPITAL_COMMUNITY): Payer: Self-pay

## 2020-09-30 ENCOUNTER — Ambulatory Visit (HOSPITAL_COMMUNITY)
Admission: EM | Admit: 2020-09-30 | Discharge: 2020-09-30 | Disposition: A | Discharge status: Home / Self Care | Payer: Medicare Other | Attending: Student | Admitting: Student

## 2020-09-30 DIAGNOSIS — R040 Epistaxis: Secondary | ICD-10-CM

## 2020-09-30 MED ORDER — OXYMETAZOLINE HCL 0.05 % NA SOLN: 1.0000 | Freq: Two times a day (BID) | NASAL | 0 refills | Status: DC

## 2020-09-30 NOTE — Discharge Instructions (Addendum)
-  I sent a prescription for Afrin nasal spray (oxymetazoline).  When you get a nosebleed, spray 1 to 2 sprays in the affected nostril.  Then pinch your nostrils together and lean forward until the bleeding stops. -Seek medical attention if the bleeding does not stop on its own. -You can also try putting some Vaseline on a Q-tip and applying that gently inside your nose to help moisturize the fragile tissue. -If you continue to experience nosebleeds, follow-up with your primary care; they will send a referral to an ear nose and throat doctor.

## 2020-09-30 NOTE — ED Triage Notes (Signed)
Pt present nose bleeding this morning when he woke up. Pt denies any injury to his nose.

## 2020-09-30 NOTE — ED Provider Notes (Signed)
[facility]    CSN: [id number] Arrival date & time: 09/30/20  1055      History   Chief Complaint Chief Complaint  Patient presents with   Epistaxis    HPI Vincent Horton is a 65 y.o. male presenting with one episode of nosebleed. Medical history CVA, cardiomyopathy, STEMI.  States that he woke up this morning and experienced about 5 minutes of right-sided epistaxis, this terminated on its own.  He has not had any other episodes of epistaxis, and denies recent trauma to the nose, nose picking, blowing nose/URI.  Here today with family member, they both speak [place] fluently.  HPI  Past Medical History:  Diagnosis Date   Cardiac arrest [facility])    Coronary artery disease    Dysrhythmia    Myocardial infarction [facility])    Stroke [facility])     Patient Active Problem List   Diagnosis Date Noted   History of ST elevation myocardial infarction (STEMI) 09/26/2020   History of pleural effusion 09/26/2020   Mixed hyperlipidemia 09/26/2020   CVA (cerebral vascular accident) (HCC) 08/10/2020   Pleural effusion    Protein-calorie malnutrition, severe 07/25/2020   Acute hypoxemic respiratory failure (HCC) 07/24/2020   Ischemic cardiomyopathy 07/24/2020   Aspiration pneumonia (HCC) 07/24/2020   Pulmonary edema cardiac cause (HCC) 07/24/2020   Acute kidney injury (HCC) 07/24/2020   Shock liver 07/24/2020   Cardiogenic shock (HCC) 07/24/2020   Transaminitis 07/24/2020   Protein calorie malnutrition (HCC) 07/23/2020   Acute ST elevation myocardial infarction (STEMI) due to occlusion of left anterior descending (LAD) coronary artery (HCC) 07/21/2020   Cardiac arrest (HCC) 07/21/2020   STEMI involving left anterior descending coronary artery (HCC) 07/21/2020    Past Surgical History:  Procedure Laterality Date   CARDIAC CATHETERIZATION     CORONARY/GRAFT ACUTE MI REVASCULARIZATION N/A 07/21/2020   Procedure: Coronary/Graft Acute MI Revascularization;  Surgeon: Swaziland, Peter M, MD;   Location: [facility] INVASIVE CV LAB;  Service: Cardiovascular;  Laterality: N/A;   LEFT HEART CATH AND CORONARY ANGIOGRAPHY N/A 07/21/2020   Procedure: LEFT HEART CATH AND CORONARY ANGIOGRAPHY;  Surgeon: Swaziland, Peter M, MD;  Location: [facility] INVASIVE CV LAB;  Service: Cardiovascular;  Laterality: N/A;   RIGHT HEART CATH N/A 07/21/2020   Procedure: RIGHT HEART CATH;  Surgeon: Swaziland, Peter M, MD;  Location: [facility] INVASIVE CV LAB;  Service: Cardiovascular;  Laterality: N/A;       Home Medications    Prior to Admission medications   Medication Sig Start Date End Date Taking? Authorizing Provider  oxymetazoline (AFRIN NASAL SPRAY) 0.05 % nasal spray Place 1 spray into both nostrils 2 (two) times daily. 1-2 sprays in the affected nostril to stop nosebleed. 09/30/20  Yes Rhys Martini, PA-C  acetaminophen (TYLENOL) 160 MG/5ML solution Take 20.3 mLs (650 mg total) by mouth every 6 (six) hours as needed for fever. 08/23/20   Angiulli, Mcarthur Rossetti, PA-C  aspirin 81 MG chewable tablet Chew 1 tablet (81 mg total) by mouth daily. 08/23/20   Angiulli, Mcarthur Rossetti, PA-C  cephALEXin (KEFLEX) 250 MG capsule Take 1 capsule (250 mg total) by mouth every 8 (eight) hours. 08/24/20   Angiulli, Mcarthur Rossetti, PA-C  clonazePAM (KLONOPIN) 0.5 MG disintegrating tablet Take 1 tablet (0.5 mg total) by mouth 3 (three) times daily. 08/23/20   Angiulli, Mcarthur Rossetti, PA-C  digoxin (LANOXIN) 0.125 MG tablet Take 1 tablet (125 mcg total) by mouth daily. 09/20/20 10/20/20  Freeman Caldron, PA-C  nicotine (NICODERM CQ - DOSED  IN MG/24 HOURS) 14 mg/24hr patch 14 mg patch daily x2 weeks then 7 mg patch daily x3 weeks and stop 08/23/20   Angiulli, Mcarthur Rossetti, PA-C  oxyCODONE (OXY IR/ROXICODONE) 5 MG immediate release tablet Take 1 tablet (5 mg total) by mouth every 8 (eight) hours as needed for severe pain. 08/23/20   Angiulli, Mcarthur Rossetti, PA-C  rosuvastatin (CRESTOR) 40 MG tablet Take 1 tablet (40 mg total) by mouth daily. 09/25/20   Mayers, Kasandra Knudsen, PA-C  ticagrelor  (BRILINTA) 90 MG TABS tablet Take 1 tablet (90 mg total) by mouth 2 (two) times daily. 09/25/20   Mayers, Kasandra Knudsen, PA-C    Family History History reviewed. No pertinent family history.  Social History Social History   Tobacco Use   Smoking status: Former    Types: Cigarettes   Smokeless tobacco: Never  Vaping Use   Vaping Use: Unknown  Substance Use Topics   Alcohol use: Not Currently   Drug use: Not Currently     Allergies   Beef-derived products, Pork-derived products, and Poultry meal   Review of Systems Review of Systems  HENT:  Positive for nosebleeds.   All other systems reviewed and are negative.   Physical Exam Triage Vital Signs ED Triage Vitals  Enc Vitals Group     BP 09/30/20 1212 101/76     Pulse Rate 09/30/20 1212 96     Resp 09/30/20 1212 18     Temp 09/30/20 1212 98.2 F (36.8 C)     Temp Source 09/30/20 1212 Oral     SpO2 09/30/20 1212 100 %     Weight --      Height --      Head Circumference --      Peak Flow --      Pain Score 09/30/20 1213 0     Pain Loc --      Pain Edu? --      Excl. in GC? --    No data found.  Updated Vital Signs BP 101/76 (BP Location: Right Arm)   Pulse 96   Temp 98.2 F (36.8 C) (Oral)   Resp 18   SpO2 100%   Visual Acuity Right Eye Distance:   Left Eye Distance:   Bilateral Distance:    Right Eye Near:   Left Eye Near:    Bilateral Near:     Physical Exam Vitals reviewed.  Constitutional:      General: He is not in acute distress.    Appearance: Normal appearance. He is not ill-appearing.  HENT:     Head: Normocephalic and atraumatic.     Right Ear: Tympanic membrane, ear canal and external ear normal. No tenderness. No middle ear effusion. There is no impacted cerumen. Tympanic membrane is not perforated, erythematous, retracted or bulging.     Left Ear: Tympanic membrane, ear canal and external ear normal. No tenderness.  No middle ear effusion. There is no impacted cerumen. Tympanic membrane  is not perforated, erythematous, retracted or bulging.     Nose: Nose normal. No congestion.     Right Nostril: No epistaxis or septal hematoma.     Left Nostril: No epistaxis or septal hematoma.     Comments: No current epistaxis. Scant crusted blood visible R nare. No nasal/septal tenderness.    Mouth/Throat:     Mouth: Mucous membranes are moist.     Pharynx: Uvula midline. No oropharyngeal exudate or posterior oropharyngeal erythema.  Eyes:     Extraocular Movements:  Extraocular movements intact.     Pupils: Pupils are equal, round, and reactive to light.  Cardiovascular:     Rate and Rhythm: Normal rate and regular rhythm.     Heart sounds: Normal heart sounds.  Pulmonary:     Effort: Pulmonary effort is normal.     Breath sounds: Normal breath sounds. No decreased breath sounds, wheezing, rhonchi or rales.  Abdominal:     Palpations: Abdomen is soft.     Tenderness: There is no abdominal tenderness. There is no guarding or rebound.  Neurological:     General: No focal deficit present.     Mental Status: He is alert and oriented to person, place, and time.  Psychiatric:        Mood and Affect: Mood normal.        Behavior: Behavior normal.        Thought Content: Thought content normal.        Judgment: Judgment normal.     UC Treatments / Results  Labs (all labs ordered are listed, but only abnormal results are displayed) Labs Reviewed - No data to display  EKG   Radiology No results found.  Procedures Procedures (including critical care time)  Medications Ordered in UC Medications - No data to display  Initial Impression / Assessment and Plan / UC Course  I have reviewed the triage vital signs and the nursing notes.  Pertinent labs & imaging results that were available during my care of the patient were reviewed by me and considered in my medical decision making (see chart for details).     This patient is a very pleasant 65 y.o. year old male presenting  with one episode of epistaxis.  This terminated on its own and did not have any triggers.  No other recent epistaxis.  Afrin as needed.  Follow-up with PCP if symptoms persist.   Final Clinical Impressions(s) / UC Diagnoses   Final diagnoses:  Epistaxis     Discharge Instructions      -I sent a prescription for Afrin nasal spray (oxymetazoline).  When you get a nosebleed, spray 1 to 2 sprays in the affected nostril.  Then pinch your nostrils together and lean forward until the bleeding stops. -Seek medical attention if the bleeding does not stop on its own. -You can also try putting some Vaseline on a Q-tip and applying that gently inside your nose to help moisturize the fragile tissue. -If you continue to experience nosebleeds, follow-up with your primary care; they will send a referral to an ear nose and throat doctor.     ED Prescriptions     Medication Sig Dispense Auth. Provider   oxymetazoline (AFRIN NASAL SPRAY) 0.05 % nasal spray Place 1 spray into both nostrils 2 (two) times daily. 1-2 sprays in the affected nostril to stop nosebleed. 30 mL Rhys Martini, PA-C      PDMP not reviewed this encounter.   Rhys Martini, PA-C 09/30/20 1337

## 2020-10-01 ENCOUNTER — Ambulatory Visit (INDEPENDENT_AMBULATORY_CARE_PROVIDER_SITE_OTHER): Payer: Medicare Other | Admitting: Physician Assistant

## 2020-10-01 VITALS — BP 124/84 | HR 74 | Temp 98.2°F | Resp 18

## 2020-10-01 DIAGNOSIS — N3001 Acute cystitis with hematuria: Secondary | ICD-10-CM | POA: Diagnosis not present

## 2020-10-01 DIAGNOSIS — N50812 Left testicular pain: Secondary | ICD-10-CM

## 2020-10-01 LAB — POCT URINALYSIS DIP (CLINITEK)
Bilirubin, UA: NEGATIVE
Glucose, UA: NEGATIVE mg/dL
Ketones, POC UA: NEGATIVE mg/dL
Leukocytes, UA: NEGATIVE
Nitrite, UA: NEGATIVE
POC PROTEIN,UA: NEGATIVE
Spec Grav, UA: 1.01 (ref 1.010–1.025)
Urobilinogen, UA: 0.2 E.U./dL
pH, UA: 5.5 (ref 5.0–8.0)

## 2020-10-01 MED ORDER — SULFAMETHOXAZOLE-TRIMETHOPRIM 800-160 MG PO TABS: 1.0000 | ORAL_TABLET | Freq: Two times a day (BID) | ORAL | 0 refills | Status: AC

## 2020-10-01 NOTE — Patient Instructions (Signed)
You will take Bactrim twice a day for the next 5 days.  Make sure to drink plenty of water.    Roney Jaffe, PA-C Physician Assistant [facility] [email or web address]   Urinary Tract Infection, Adult  A urinary tract infection (UTI) is an infection of any part of the urinary tract. The urinary tract includes the kidneys, ureters, bladder, and urethra.These organs make, store, and get rid of urine in the body. An upper UTI affects the ureters and kidneys. A lower UTI affects the bladderand urethra. What are the causes? Most urinary tract infections are caused by bacteria in your genital area around your urethra, where urine leaves your body. These bacteria grow andcause inflammation of your urinary tract. What increases the risk? You are more likely to develop this condition if: You have a urinary catheter that stays in place. You are not able to control when you urinate or have a bowel movement (incontinence). You are male and you: Use a spermicide or diaphragm for birth control. Have low estrogen levels. Are pregnant. You have certain genes that increase your risk. You are sexually active. You take antibiotic medicines. You have a condition that causes your flow of urine to slow down, such as: An enlarged prostate, if you are male. Blockage in your urethra. A kidney stone. A nerve condition that affects your bladder control (neurogenic bladder). Not getting enough to drink, or not urinating often. You have certain medical conditions, such as: Diabetes. A weak disease-fighting system (immunesystem). Sickle cell disease. Gout. Spinal cord injury. What are the signs or symptoms? Symptoms of this condition include: Needing to urinate right away (urgency). Frequent urination. This may include small amounts of urine each time you urinate. Pain or burning with urination. Blood in the urine. Urine that smells bad or  unusual. Trouble urinating. Cloudy urine. Vaginal discharge, if you are male. Pain in the abdomen or the lower back. You may also have: Vomiting or a decreased appetite. Confusion. Irritability or tiredness. A fever or chills. Diarrhea. The first symptom in older adults may be confusion. In some cases, they may nothave any symptoms until the infection has worsened. How is this diagnosed? This condition is diagnosed based on your medical history and a physical exam. You may also have other tests, including: Urine tests. Blood tests. Tests for STIs (sexually transmitted infections). If you have had more than one UTI, a cystoscopy or imaging studies may be doneto determine the cause of the infections. How is this treated? Treatment for this condition includes: Antibiotic medicine. Over-the-counter medicines to treat discomfort. Drinking enough water to stay hydrated. If you have frequent infections or have other conditions such as a kidney stone, you may need to see a health care provider who specializes in the urinary tract (urologist). In rare cases, urinary tract infections can cause sepsis. Sepsis is a life-threatening condition that occurs when the body responds to an infection. Sepsis is treated in the hospital with IV antibiotics, fluids, and othermedicines. Follow these instructions at home:  Medicines Take over-the-counter and prescription medicines only as told by your health care provider. If you were prescribed an antibiotic medicine, take it as told by your health care provider. Do not stop using the antibiotic even if you start to feel better. General instructions Make sure you: Empty your bladder often and completely. Do not hold urine for long periods of time. Empty your bladder after sex. Wipe from front to back after urinating or having a bowel movement if you  are male. Use each tissue only one time when you wipe. Drink enough fluid to keep your urine pale  yellow. Keep all follow-up visits. This is important. Contact a health care provider if: Your symptoms do not get better after 1-2 days. Your symptoms go away and then return. Get help right away if: You have severe pain in your back or your lower abdomen. You have a fever or chills. You have nausea or vomiting. Summary A urinary tract infection (UTI) is an infection of any part of the urinary tract, which includes the kidneys, ureters, bladder, and urethra. Most urinary tract infections are caused by bacteria in your genital area. Treatment for this condition often includes antibiotic medicines. If you were prescribed an antibiotic medicine, take it as told by your health care provider. Do not stop using the antibiotic even if you start to feel better. Keep all follow-up visits. This is important. This information is not intended to replace advice given to you by your health care provider. Make sure you discuss any questions you have with your healthcare provider. Document Revised: 09/30/2019 Document Reviewed: 09/30/2019 Elsevier Patient Education  2022 [employer].

## 2020-10-01 NOTE — Progress Notes (Signed)
Patient has eaten and taken medication today. Patient complains of left testicular pain returning after completing cipro last month. Patient is requesting a refill and has not followed up with specialist at this time.

## 2020-10-01 NOTE — Progress Notes (Signed)
Established Patient Office Visit  Subjective:  Patient ID: Vincent Horton, male    DOB: Jan 24, 1956  Age: 65 y.o. MRN: [id number]  CC:  Chief Complaint  Patient presents with   Testicle Pain     HPI Vincent Horton presents for a refill of Keflex, states that he has been having slight left testicular pain that returned after finishing the keflex.  States that he was being treated for a UTI while in the hospital with keflex.   Denies dysuria, abdominal pain or back pain.  Denies chills or fever.  Daughter is present and does help with history.  Due to language barrier, an interpreter was present during the history-taking and subsequent discussion (and for part of the physical exam) with this patient.         Past Medical History:  Diagnosis Date   Cardiac arrest [facility])    Coronary artery disease    Dysrhythmia    Myocardial infarction [facility])    Stroke [facility])     Past Surgical History:  Procedure Laterality Date   CARDIAC CATHETERIZATION     CORONARY/GRAFT ACUTE MI REVASCULARIZATION N/A 07/21/2020   Procedure: Coronary/Graft Acute MI Revascularization;  Surgeon: Swaziland, Peter M, MD;  Location: [facility] INVASIVE CV LAB;  Service: Cardiovascular;  Laterality: N/A;   LEFT HEART CATH AND CORONARY ANGIOGRAPHY N/A 07/21/2020   Procedure: LEFT HEART CATH AND CORONARY ANGIOGRAPHY;  Surgeon: Swaziland, Peter M, MD;  Location: [facility] INVASIVE CV LAB;  Service: Cardiovascular;  Laterality: N/A;   RIGHT HEART CATH N/A 07/21/2020   Procedure: RIGHT HEART CATH;  Surgeon: Swaziland, Peter M, MD;  Location: [facility] INVASIVE CV LAB;  Service: Cardiovascular;  Laterality: N/A;    History reviewed. No pertinent family history.  Social History   Socioeconomic History   Marital status: Widowed    Spouse name: Not on file   Number of children: Not on file   Years of education: Not on file   Highest education level: Not on file  Occupational History   Not on file  Tobacco Use   Smoking status: Former    Types:  Cigarettes   Smokeless tobacco: Never  Vaping Use   Vaping Use: Unknown  Substance and Sexual Activity   Alcohol use: Not Currently   Drug use: Not Currently   Sexual activity: Not on file  Other Topics Concern   Not on file  Social History Narrative   Lives with his 4 daughters   Right Handed   Drinks 3-4 cups caffeine   Social Determinants of Health   Financial Resource Strain: Not on file  Food Insecurity: Not on file  Transportation Needs: Not on file  Physical Activity: Not on file  Stress: Not on file  Social Connections: Not on file  Intimate Partner Violence: Not on file    Outpatient Medications Prior to Visit  Medication Sig Dispense Refill   acetaminophen (TYLENOL) 160 MG/5ML solution Take 20.3 mLs (650 mg total) by mouth every 6 (six) hours as needed for fever. 120 mL 0   aspirin 81 MG chewable tablet Chew 1 tablet (81 mg total) by mouth daily.     cephALEXin (KEFLEX) 250 MG capsule Take 1 capsule (250 mg total) by mouth every 8 (eight) hours. 18 capsule 8   clonazePAM (KLONOPIN) 0.5 MG disintegrating tablet Take 1 tablet (0.5 mg total) by mouth 3 (three) times daily. 60 tablet 0   digoxin (LANOXIN) 0.125 MG tablet Take 1 tablet (125 mcg total) by mouth daily. 30 tablet  0   nicotine (NICODERM CQ - DOSED IN MG/24 HOURS) 14 mg/24hr patch 14 mg patch daily x2 weeks then 7 mg patch daily x3 weeks and stop 28 patch 0   oxyCODONE (OXY IR/ROXICODONE) 5 MG immediate release tablet Take 1 tablet (5 mg total) by mouth every 8 (eight) hours as needed for severe pain. 30 tablet 0   oxymetazoline (AFRIN NASAL SPRAY) 0.05 % nasal spray Place 1 spray into both nostrils 2 (two) times daily. 1-2 sprays in the affected nostril to stop nosebleed. 30 mL 0   rosuvastatin (CRESTOR) 40 MG tablet Take 1 tablet (40 mg total) by mouth daily. 30 tablet 1   ticagrelor (BRILINTA) 90 MG TABS tablet Take 1 tablet (90 mg total) by mouth 2 (two) times daily. 60 tablet 1   No facility-administered  medications prior to visit.    Allergies  Allergen Reactions   Beef-Derived Products Other (See Comments)    Patient is Muslim - cannot get ANY beef, pork, or poultry products (food and medications included)   Pork-Derived Products Other (See Comments)    Patient is Muslim - cannot get ANY beef, pork, or poultry products (food and medications included)   Poultry Meal Other (See Comments)    Patient is Muslim - cannot get ANY beef, pork, or poultry products (food and medications included)    ROS Review of Systems  Constitutional:  Negative for chills and fever.  HENT: Negative.    Eyes: Negative.   Respiratory:  Negative for shortness of breath.   Cardiovascular:  Negative for chest pain.  Gastrointestinal:  Negative for abdominal pain, nausea and vomiting.  Endocrine: Negative.   Genitourinary:  Positive for testicular pain. Negative for dysuria, hematuria, penile discharge, penile pain and scrotal swelling.  Musculoskeletal: Negative.   Skin: Negative.   Allergic/Immunologic: Negative.   Neurological: Negative.   Hematological: Negative.   Psychiatric/Behavioral: Negative.       Objective:    Physical Exam Vitals and nursing note reviewed.  Constitutional:      General: He is not in acute distress.    Appearance: Normal appearance. He is not ill-appearing.  HENT:     Head: Normocephalic and atraumatic.     Right Ear: External ear normal.     Left Ear: External ear normal.     Nose: Nose normal.     Mouth/Throat:     Mouth: Mucous membranes are moist.     Pharynx: Oropharynx is clear.  Eyes:     Extraocular Movements: Extraocular movements intact.     Conjunctiva/sclera: Conjunctivae normal.     Pupils: Pupils are equal, round, and reactive to light.  Cardiovascular:     Rate and Rhythm: Normal rate and regular rhythm.     Pulses: Normal pulses.     Heart sounds: Normal heart sounds.  Pulmonary:     Effort: Pulmonary effort is normal.     Breath sounds: Normal  breath sounds.  Abdominal:     General: Abdomen is flat.     Tenderness: There is no abdominal tenderness. There is no right CVA tenderness or left CVA tenderness.  Musculoskeletal:        General: Normal range of motion.     Cervical back: Normal range of motion and neck supple.  Skin:    General: Skin is warm and dry.  Neurological:     General: No focal deficit present.     Mental Status: He is alert and oriented to person, place, and time.  Psychiatric:        Mood and Affect: Mood normal.        Behavior: Behavior normal.        Thought Content: Thought content normal.        Judgment: Judgment normal.    There were no vitals taken for this visit. Wt Readings from Last 3 Encounters:  09/28/20 113 lb (51.3 kg)  09/25/20 113 lb (51.3 kg)  09/13/20 109 lb 3.2 oz (49.5 kg)     Health Maintenance Due  Topic Date Due   COVID-19 Vaccine (1) Never done   TETANUS/TDAP  Never done   COLONOSCOPY (Pts 45-2yrs Insurance coverage will need to be confirmed)  Never done   Zoster Vaccines- Shingrix (1 of 2) Never done   PNA vac Low Risk Adult (1 of 2 - PCV13) Never done   INFLUENZA VACCINE  10/01/2020    There are no preventive care reminders to display for this patient.  No results found for: TSH Lab Results  Component Value Date   WBC 9.1 09/11/2020   HGB 12.3 (L) 09/11/2020   HCT 39.3 09/11/2020   MCV 98.0 09/11/2020   PLT 279 09/11/2020   Lab Results  Component Value Date   NA 135 09/11/2020   K 4.0 09/11/2020   CO2 24 09/11/2020   GLUCOSE 193 (H) 09/11/2020   BUN 7 (L) 09/11/2020   CREATININE 0.64 09/11/2020   BILITOT 0.3 08/20/2020   ALKPHOS 89 08/20/2020   AST 22 08/20/2020   ALT 26 08/20/2020   PROT 5.2 (L) 08/20/2020   ALBUMIN 2.3 (L) 08/20/2020   CALCIUM 8.9 09/11/2020   ANIONGAP 8 09/11/2020   Lab Results  Component Value Date   CHOL 126 09/13/2020   Lab Results  Component Value Date   HDL 70 09/13/2020   Lab Results  Component Value Date    LDLCALC 40 09/13/2020   Lab Results  Component Value Date   TRIG 80 09/13/2020   Lab Results  Component Value Date   CHOLHDL 1.8 09/13/2020   Lab Results  Component Value Date   HGBA1C 5.9 (H) 09/13/2020      Assessment & Plan:   Problem List Items Addressed This Visit   None Visit Diagnoses     Acute cystitis with hematuria    -  Primary   Relevant Medications   sulfamethoxazole-trimethoprim (BACTRIM DS) 800-160 MG tablet   Other Relevant Orders   POCT URINALYSIS DIP (CLINITEK) (Completed)       Meds ordered this encounter  Medications   sulfamethoxazole-trimethoprim (BACTRIM DS) 800-160 MG tablet    Sig: Take 1 tablet by mouth 2 (two) times daily for 5 days.    Dispense:  10 tablet    Refill:  0    Order Specific Question:   Supervising Provider    Answer:   Delford Field, PATRICK E [[id number]]   1. Acute cystitis with hematuria On review of hospital records, patient was treated for urinary tract infection wall inpatient, urine culture did show Component 1 mo ago   Specimen Description URINE, RANDOM   Special Requests NONE  Performed at [facility], [address] [place], [place] [address]   Culture >=100,000 COLONIES/mL CITROBACTER KOSERI Abnormal    Report Status 08/23/2020 FINAL   Organism ID, Bacteria CITROBACTER KOSERI Abnormal    Resulting Agency [facility]      Susceptibility   Citrobacter koseri    MIC    CEFAZOLIN <=4 SENSITIVE  Sensitive  CEFEPIME <=0.12 SENS... Sensitive    CEFTRIAXONE <=0.25 SENS... Sensitive    CIPROFLOXACIN <=0.25 SENS... Sensitive    GENTAMICIN <=1 SENSITIVE  Sensitive    IMIPENEM <=0.25 SENS... Sensitive    NITROFURANTOIN 64 INTERMED... Intermediate    PIP/TAZO <=4 SENSITIVE  Sensitive    TRIMETH/SULFA <=20 SENSIT... Sensitive                Patient was treated with Keflex, symptoms did resolve temporarily, will treat with Bactrim.  Encouraged increase hydration.  Patient education given on supportive care.  Red  flags given for prompt reevaluation.  - POCT URINALYSIS DIP (CLINITEK) - sulfamethoxazole-trimethoprim (BACTRIM DS) 800-160 MG tablet; Take 1 tablet by mouth 2 (two) times daily for 5 days.  Dispense: 10 tablet; Refill: 0   I have reviewed the patient's medical history (PMH, PSH, Social History, Family History, Medications, and allergies) , and have been updated if relevant. I spent 23 minutes reviewing chart and  face to face time with patient.    Follow-up: Return if symptoms worsen or fail to improve.    Kasandra Knudsen Mayers, PA-C

## 2020-10-02 ENCOUNTER — Encounter: Payer: Self-pay | Admitting: Physician Assistant

## 2020-10-03 ENCOUNTER — Institutional Professional Consult (permissible substitution) (HOSPITAL_BASED_OUTPATIENT_CLINIC_OR_DEPARTMENT_OTHER): Payer: Medicare Other | Admitting: Internal Medicine

## 2020-10-08 ENCOUNTER — Ambulatory Visit: Payer: Medicare Other | Admitting: Speech Pathology

## 2020-10-08 ENCOUNTER — Ambulatory Visit: Payer: Medicare Other | Attending: Physician Assistant | Admitting: Physical Therapy

## 2020-10-08 ENCOUNTER — Encounter: Payer: Self-pay | Admitting: Speech Pathology

## 2020-10-08 ENCOUNTER — Ambulatory Visit: Payer: Medicare Other | Admitting: Occupational Therapy

## 2020-10-08 ENCOUNTER — Encounter: Payer: Self-pay | Admitting: Physical Therapy

## 2020-10-08 ENCOUNTER — Other Ambulatory Visit: Payer: Self-pay

## 2020-10-08 DIAGNOSIS — M6281 Muscle weakness (generalized): Secondary | ICD-10-CM | POA: Insufficient documentation

## 2020-10-08 DIAGNOSIS — I69318 Other symptoms and signs involving cognitive functions following cerebral infarction: Secondary | ICD-10-CM | POA: Insufficient documentation

## 2020-10-08 DIAGNOSIS — R2681 Unsteadiness on feet: Secondary | ICD-10-CM | POA: Insufficient documentation

## 2020-10-08 DIAGNOSIS — R1312 Dysphagia, oropharyngeal phase: Secondary | ICD-10-CM

## 2020-10-08 DIAGNOSIS — R2689 Other abnormalities of gait and mobility: Secondary | ICD-10-CM | POA: Diagnosis present

## 2020-10-08 DIAGNOSIS — I6300 Cerebral infarction due to thrombosis of unspecified precerebral artery: Secondary | ICD-10-CM | POA: Insufficient documentation

## 2020-10-08 DIAGNOSIS — R41841 Cognitive communication deficit: Secondary | ICD-10-CM | POA: Diagnosis present

## 2020-10-08 NOTE — Therapy (Signed)
[facility] [address] [place], [place], [address] Phone: [phone]   Fax:  [phone]  Speech Language Pathology Treatment & Clinical Swallow Eval  Patient Details  Name: Vincent Horton MRN: [id number] Date of Birth: March 03, 1956 Referring Provider (SLP): Dr. Claudette Laws   Encounter Date: 10/08/2020   End of Session - 10/08/20 1245     Visit Number 2    Number of Visits 10    Date for SLP Re-Evaluation 10/29/20    SLP Start Time 1145    SLP Stop Time  1220    SLP Time Calculation (min) 35 min    Activity Tolerance Patient tolerated treatment well             Past Medical History:  Diagnosis Date   Cardiac arrest [facility])    Coronary artery disease    Dysrhythmia    Myocardial infarction [facility])    Stroke [facility])     Past Surgical History:  Procedure Laterality Date   CARDIAC CATHETERIZATION     CORONARY/GRAFT ACUTE MI REVASCULARIZATION N/A 07/21/2020   Procedure: Coronary/Graft Acute MI Revascularization;  Surgeon: Swaziland, Peter M, MD;  Location: [facility] INVASIVE CV LAB;  Service: Cardiovascular;  Laterality: N/A;   LEFT HEART CATH AND CORONARY ANGIOGRAPHY N/A 07/21/2020   Procedure: LEFT HEART CATH AND CORONARY ANGIOGRAPHY;  Surgeon: Swaziland, Peter M, MD;  Location: [facility] INVASIVE CV LAB;  Service: Cardiovascular;  Laterality: N/A;   RIGHT HEART CATH N/A 07/21/2020   Procedure: RIGHT HEART CATH;  Surgeon: Swaziland, Peter M, MD;  Location: [facility] INVASIVE CV LAB;  Service: Cardiovascular;  Laterality: N/A;    There were no vitals filed for this visit.   Subjective Assessment - 10/08/20 1228     Subjective "He has not eaten meat"    Patient is accompained by: Family member   son Aweis   Pain Score 0-No pain                   ADULT SLP TREATMENT - 10/08/20 1202       General Information   Behavior/Cognition Alert;Cooperative;Pleasant mood      Treatment Provided   Treatment provided Dysphagia       Dysphagia Treatment   Temperature Spikes Noted No    Respiratory Status Room air    Oral Cavity - Dentition Missing dentition    Treatment Methods Skilled observation;Upgraded PO texture trial;Compensation strategy training;Patient/caregiver education    Patient observed directly with PO's Yes    Type of PO's observed Regular;Thin liquids    Feeding Able to feed self    Liquids provided via Cup    Oral Phase Signs & Symptoms Other (comment)   mild oral residue with peanut butter cracker, cleared with verbal cues   Pharyngeal Phase Signs & Symptoms --   none   Type of cueing Verbal    Amount of cueing Minimal    Other treatment/comments Clinical swallow evaluation completed today. Pt tolerated regualr solid and thin liquids with no overt s/s of pharyngeal dysphagia. Successive liquid sips with no difficulty. Mild oral residue cleared with verbal cues. Pt and son trained in swallow precautions. He was in the process of getting dentures prior to CVA, however he tolerated regular solids before CVA with limited dentition.      Cognitive-Linquistic Treatment   Treatment focused on Cognition    Skilled Treatment Son present as Sports coach. Son and pt continue to deny any changes in cognition. [place] is completing light cooking,  his own laundry and clearning. He is cleared to drive by rehab physician. Daughters continue to manage his medications. Instructed them to get a pill organizer and allow pt to sort meds with supervision. Use alarm and pill box to recall am meds this week with 3 or  less reminders from daughters.      Assessment / Recommendations / Plan   Plan Continue with current plan of care      Dysphagia Recommendations   Diet recommendations Regular;Thin liquid    Liquids provided via Cup;Straw    Medication Administration Whole meds with liquid    Supervision Patient able to self feed;Intermittent supervision to cue for compensatory strategies    Compensations Slow rate;Small  sips/bites;Check for pocketing   2 swallows for each bite; no distractions with meals   Postural Changes and/or Swallow Maneuvers Out of bed for meals;Seated upright 90 degrees      Progression Toward Goals   Progression toward goals Progressing toward goals                SLP Short Term Goals - 10/08/20 1245       SLP SHORT TERM GOAL #1   Title Complete clinical swallow evaluation    Time 1    Period Weeks    Status Achieved      SLP SHORT TERM GOAL #2   Title Verbalize 2 cognitive impairments with rare min A    Time 3    Period Weeks    Status On-going      SLP SHORT TERM GOAL #3   Title ID safe vs unsafe decisions at home and with chores with occasional min A    Time 3    Period Weeks    Status On-going      SLP SHORT TERM GOAL #4   Title Complete med sorting task with occasional min A and ID any errors over 2 sessoins    Time 3    Period Weeks    Status On-going      SLP SHORT TERM GOAL #5   Title Follow swallow precautions and diet modifications with rare min A over 2 sessions    Time 3    Period Weeks    Status On-going              SLP Long Term Goals - 10/08/20 1245       SLP LONG TERM GOAL #1   Title Pt will manage either am or pm meds with supervision from family    Time 6    Period Weeks    Status On-going      SLP LONG TERM GOAL #2   Title Pt will carryover 3 strategies for attention, memory and self monitoring for 3 household chores with rare min A from family    Time 6    Period Weeks    Status On-going      SLP LONG TERM GOAL #3   Title Pt will follow diet modifications and swallow precautions with supervision cues    Time 6    Period Weeks    Status On-going              Plan - 10/08/20 1241     Clinical Impression Statement Complete clinical swallow evaluation today. Advance to regular solids and thin liquds with precautions. See Skilled Intervention. Pt and family continue to deny cognitive changes. Initiated training  of swallow precautions. Continue skilled ST to maximize safety of swallow and cognition for  safety and return to PLOF.    Speech Therapy Frequency 2x / week    Duration --   6 weeks or 12 visits   Treatment/Interventions Aspiration precaution training;Environmental controls;Cueing hierarchy;SLP instruction and feedback;Compensatory strategies;Functional tasks;Cognitive reorganization;Compensatory techniques;Pharyngeal strengthening exercises;Diet toleration management by SLP;Trials of upgraded texture/liquids;Internal/external aids;Multimodal communcation approach;Patient/family education;Other (comment)   repeat objective swallow study if indicated   Potential to Achieve Goals Good             Patient will benefit from skilled therapeutic intervention in order to improve the following deficits and impairments:   Cognitive communication deficit  Dysphagia, oropharyngeal phase    Problem List Patient Active Problem List   Diagnosis Date Noted   History of ST elevation myocardial infarction (STEMI) 09/26/2020   History of pleural effusion 09/26/2020   Mixed hyperlipidemia 09/26/2020   CVA (cerebral vascular accident) (HCC) 08/10/2020   Pleural effusion    Protein-calorie malnutrition, severe 07/25/2020   Acute hypoxemic respiratory failure (HCC) 07/24/2020   Ischemic cardiomyopathy 07/24/2020   Aspiration pneumonia (HCC) 07/24/2020   Pulmonary edema cardiac cause (HCC) 07/24/2020   Acute kidney injury (HCC) 07/24/2020   Shock liver 07/24/2020   Cardiogenic shock (HCC) 07/24/2020   Transaminitis 07/24/2020   Protein calorie malnutrition (HCC) 07/23/2020   Acute ST elevation myocardial infarction (STEMI) due to occlusion of left anterior descending (LAD) coronary artery (HCC) 07/21/2020   Cardiac arrest (HCC) 07/21/2020   STEMI involving left anterior descending coronary artery (HCC) 07/21/2020    Briah Nary, Radene Journey MS, CCC-SLP 10/08/2020, 12:48 PM  [facility] [address] [place], [place], [address] Phone: [phone]   Fax:  [phone]   Name: Trayshawn Durkin MRN: [id number] Date of Birth: 11-25-1955

## 2020-10-08 NOTE — Patient Instructions (Signed)
   It's OK to eat regular diet and thin liquids  With meat - small bites  Eat slowly  Make sure your mouth is clear before you take the next bite  Two swallows with each bite  Get a pill organizer and have family supervise you sorting your meds  This week, try to remember your am meds and mid day meds without your daughters have to remind you

## 2020-10-08 NOTE — Therapy (Signed)
[facility] [address] [place], [place], [address] Phone: [phone]   Fax:  [phone]  Occupational Therapy Treatment  Patient Details  Name: Vincent Horton MRN: [id number] Date of Birth: 1955-12-24 No data recorded  Encounter Date: 10/08/2020   OT End of Session - 10/08/20 1324     Visit Number 2    Number of Visits 9    Date for OT Re-Evaluation 11/02/20    Authorization Type MCR/MCD    Progress Note Due on Visit 10    OT Start Time 1230    OT Stop Time 1310    OT Time Calculation (min) 40 min    Activity Tolerance Patient tolerated treatment well    Behavior During Therapy Vincent Horton             Past Medical History:  Diagnosis Date   Cardiac arrest [facility])    Coronary artery disease    Dysrhythmia    Myocardial infarction ([place])    Stroke [facility])     Past Surgical History:  Procedure Laterality Date   CARDIAC CATHETERIZATION     CORONARY/GRAFT ACUTE MI REVASCULARIZATION N/A 07/21/2020   Procedure: Coronary/Graft Acute MI Revascularization;  Surgeon: Martinique, Peter M, MD;  Location: [place] [facility];  Service: Cardiovascular;  Laterality: N/A;   LEFT HEART CATH AND CORONARY ANGIOGRAPHY N/A 07/21/2020   Procedure: LEFT HEART CATH AND CORONARY ANGIOGRAPHY;  Surgeon: Martinique, Peter M, MD;  Location: [place] [facility];  Service: Cardiovascular;  Laterality: N/A;   RIGHT HEART CATH N/A 07/21/2020   Procedure: RIGHT HEART CATH;  Surgeon: Martinique, Peter M, MD;  Location: [place] CV LAB;  Service: Cardiovascular;  Laterality: N/A;    There were no vitals filed for this visit.   Subjective Assessment - 10/08/20 1329     Patient is accompanied by: Family member   son   Pertinent History Pt is a 65 y/o male seen on 07/21/2020 for evaluation of STEMI. Upon EMS arrival, pt apneic and was in VFib and shocked 5 times with 15-min CPR before ROSC achieved. Pt intubated on site (5/21) and  transferred to [facility]. Recent CXR findings reveal diffuse bilateral pulmonary infiltrates/edema, progressed from  prior exam. Bibasilar atelectasis. Small right pleural effusion. Pt extubated on 07/31/2020. MRI done on 08/07/20 due to persistent AMS post cardiac arrest showing punctate acute to subacute Lt front infarct. Inpatient rehab 6/10 - 08/24/20. PMH includes cardiac arrest, CAD, dysrhythmia, myocardial infarction.    Currently in Pain? No/denies             Putty HEP issued for grip strength (issued green resistance putty)  Copying peg design with max v.c.'s - son interpreting Pt asked to draw clock with circle drawn for him - pt with difficulty spacing numbers.  Environmental scanning: finding 12/13 items with significant initial cueing to look to Lt side (in native language) Line bisection test w/ initial cues for understanding - however improved accuracy w/ repetition                       OT Education - 10/08/20 1244     Education Details Putty HEP    Person(s) Educated Patient;Child(ren)    Methods Explanation;Demonstration;Handout    Comprehension Verbalized understanding;Returned demonstration              OT Short Term Goals - 09/17/20 1416       OT SHORT TERM GOAL #1  Title STG's = LTG's               OT Long Term Goals - 10/08/20 1325       OT LONG TERM GOAL #1   Title Independent with putty HEP for Rt hand    Time 6    Period Weeks    Status Achieved      OT LONG TERM GOAL #2   Title Environmental scanning with 80% accuracy or higher    Time 6    Period Weeks    Status Partially Met   w/ significant initial cues required to look Lt     OT LONG TERM GOAL #3   Title Pt to return to simple cooking w/ distant supervision    Time 6    Period Weeks    Status New      OT LONG TERM GOAL #4   Title Pt to participate in medication managment with supervision    Time 6    Period Weeks    Status Deferred   family doing with pill  organizer box                  Plan - 10/08/20 1326     Clinical Impression Statement Pt appears to have visual/perceptual deficits (difficulty drawing clock/spacing numbers and copying peg design), and ? Lt inattention as pt required significant initial cues to look to both sides/Lt side w/ environmental scanning. Anticipate some safety concerns w/ driving and encouraged pt/son that he would need supervision and gradual intro back into driving.    OT Occupational Profile and History Detailed Assessment- Review of Records and additional review of physical, cognitive, psychosocial history related to current functional performance    Occupational performance deficits (Please refer to evaluation for details): ADL's;IADL's;Leisure    Body Structure / Function / Physical Skills Dexterity;Strength;UE functional use;IADL;Coordination;Mobility    Cognitive Skills Safety Awareness;Problem Solve;Memory    Rehab Potential Good    Clinical Decision Making Several treatment options, min-mod task modification necessary    Comorbidities Affecting Occupational Performance: May have comorbidities impacting occupational performance    Modification or Assistance to Complete Evaluation  Min-Moderate modification of tasks or assist with assess necessary to complete eval    OT Frequency 2x / week    OT Duration 4 weeks   or 8 visits over 6 weeks (d/t scheduling conflicts)   OT Treatment/Interventions Self-care/ADL training;DME and/or AE instruction;Therapeutic activities;Therapeutic exercise;Cognitive remediation/compensation;Functional Mobility Training;Neuromuscular education;Patient/family education    Plan cooking task, pt/son wish to d/c next session. Pt only had today's session scheduled but was able to convince them to schedule 1 more    Consulted and Agree with Plan of Care Patient;Family member/caregiver    Family Member Consulted son             Patient will benefit from skilled therapeutic  intervention in order to improve the following deficits and impairments:   Body Structure / Function / Physical Skills: Dexterity, Strength, UE functional use, IADL, Coordination, Mobility Cognitive Skills: Safety Awareness, Problem Solve, Memory     Visit Diagnosis: Other symptoms and signs involving cognitive functions following cerebral infarction  Muscle weakness (generalized)    Problem List Patient Active Problem List   Diagnosis Date Noted   History of ST elevation myocardial infarction (STEMI) 09/26/2020   History of pleural effusion 09/26/2020   Mixed hyperlipidemia 09/26/2020   CVA (cerebral vascular accident) ([place]) 08/10/2020   Pleural effusion    Protein-calorie malnutrition, severe 07/25/2020  Acute hypoxemic respiratory failure ([place]) 07/24/2020   Ischemic cardiomyopathy 07/24/2020   Aspiration pneumonia ([place]) 07/24/2020   Pulmonary edema cardiac cause ([place]) 07/24/2020   Acute kidney injury ([place]) 07/24/2020   Shock liver 07/24/2020   Cardiogenic shock ([place]) 07/24/2020   Transaminitis 07/24/2020   Protein calorie malnutrition ([place]) 07/23/2020   Acute ST elevation myocardial infarction (STEMI) due to occlusion of left anterior descending (LAD) coronary artery ([place]) 07/21/2020   Cardiac arrest ([place]) 07/21/2020   STEMI involving left anterior descending coronary artery ([place]) 07/21/2020    Carey Bullocks, OTR/L 10/08/2020, 1:31 PM  [facility] [address] [place], [place], [address] Phone: [phone]   Fax:  [phone]  Name: Tyrail Grandfield MRN: [id number] Date of Birth: 04/17/1955

## 2020-10-08 NOTE — Therapy (Signed)
[facility] [address] [place], [place], [address] Phone: [phone]   Fax:  [phone]  Physical Therapy Treatment  Patient Details  Name: Vincent Horton MRN: [id number] Date of Birth: 11/14/1955 No data recorded  Encounter Date: 10/08/2020   PT End of Session - 10/08/20 1152     Visit Number 2    Number of Visits 5    Date for PT Re-Evaluation 10/22/20    Authorization Type MCR A&B    Progress Note Due on Visit 5    PT Start Time 1123   pt arrived late.   PT Stop Time 1143    PT Time Calculation (min) 20 min    Equipment Utilized During Treatment Gait belt    Activity Tolerance Patient tolerated treatment well             Past Medical History:  Diagnosis Date   Cardiac arrest [facility])    Coronary artery disease    Dysrhythmia    Myocardial infarction [facility])    Stroke [facility])     Past Surgical History:  Procedure Laterality Date   CARDIAC CATHETERIZATION     CORONARY/GRAFT ACUTE MI REVASCULARIZATION N/A 07/21/2020   Procedure: Coronary/Graft Acute MI Revascularization;  Surgeon: Swaziland, Peter M, MD;  Location: [facility] INVASIVE CV LAB;  Service: Cardiovascular;  Laterality: N/A;   LEFT HEART CATH AND CORONARY ANGIOGRAPHY N/A 07/21/2020   Procedure: LEFT HEART CATH AND CORONARY ANGIOGRAPHY;  Surgeon: Swaziland, Peter M, MD;  Location: [facility] INVASIVE CV LAB;  Service: Cardiovascular;  Laterality: N/A;   RIGHT HEART CATH N/A 07/21/2020   Procedure: RIGHT HEART CATH;  Surgeon: Swaziland, Peter M, MD;  Location: [facility] INVASIVE CV LAB;  Service: Cardiovascular;  Laterality: N/A;    There were no vitals filed for this visit.   Subjective Assessment - 10/08/20 1124     Subjective Pt presents with SPC today.    Currently in Pain? No/denies                [facility] PT Assessment - 10/08/20 0001       Functional Gait  Assessment   Gait assessed  Yes    Gait Level Surface Walks 20 ft in less than 5.5 sec, no assistive devices,  good speed, no evidence for imbalance, normal gait pattern, deviates no more than 6 in outside of the 12 in walkway width.    Change in Gait Speed Able to smoothly change walking speed without loss of balance or gait deviation. Deviate no more than 6 in outside of the 12 in walkway width.    Gait with Horizontal Head Turns Performs head turns smoothly with no change in gait. Deviates no more than 6 in outside 12 in walkway width    Gait with Vertical Head Turns Performs head turns with no change in gait. Deviates no more than 6 in outside 12 in walkway width.    Gait and Pivot Turn Pivot turns safely within 3 sec and stops quickly with no loss of balance.    Step Over Obstacle Is able to step over 2 stacked shoe boxes taped together (9 in total height) without changing gait speed. No evidence of imbalance.    Gait with Narrow Base of Support Is able to ambulate for 10 steps heel to toe with no staggering.    Gait with Eyes Closed Walks 20 ft, uses assistive device, slower speed, mild gait deviations, deviates 6-10 in outside 12 in walkway width. Ambulates 20 ft  in less than 9 sec but greater than 7 sec.    Ambulating Backwards Walks 20 ft, no assistive devices, good speed, no evidence for imbalance, normal gait    Steps Alternating feet, no rail.    Total Score 29    FGA comment: Interpretation: 25-28 = low risk fall                                   PT Education - 10/08/20 1150     Education Details discussed results of FGA , POC, and scheduling and option for interpreter.    Person(s) Educated Patient;Other (comment)   adult child   Methods Explanation    Comprehension Verbalized understanding              PT Short Term Goals - 09/18/20 0733       PT SHORT TERM GOAL #1   Title Instruct in HEP    Baseline TBD    Time 3    Period Weeks    Status New    Target Date 10/09/20      PT SHORT TERM GOAL #2   Title Assess FGA and set goal as needed.     Baseline TBD    Time 2    Period Weeks    Status New    Target Date 10/09/20               PT Long Term Goals - 09/18/20 0735       PT LONG TERM GOAL #1   Title Assess progress towards FGA.    Baseline TBD    Time 4    Period Days    Status New    Target Date 10/23/20      PT LONG TERM GOAL #2   Title I in floor transfers    Baseline TBD    Time 4    Period Weeks    Status New    Target Date 10/23/20      PT LONG TERM GOAL #3   Title Patient to demo 0.8 m/s gait speed    Baseline TBD    Time 4    Period Weeks    Status New    Target Date 10/23/20      PT LONG TERM GOAL #4   Title Patient to negotiate full flight of steps with most appropriate pattern.    Baseline TBD    Time 4    Period Weeks    Status New    Target Date 10/23/20                   Plan - 10/08/20 1156     Clinical Impression Statement Pt arrived late to PT. Pt came with adult son.  Pt reported understanding this PTA's directions but son did interpret some instructions to pt.  Made pt aware of opportunity to have an interpreter. Pt stated that he did not need one at this time.  Scored 29/30 on FGA. Discussed results of FGA , POC, and scheduling.  Pt verbalized understanding.    Personal Factors and Comorbidities Comorbidity 1    Comorbidities L infarct s/p MI    Examination-Activity Limitations Stairs;Locomotion Level    Stability/Clinical Decision Making Stable/Uncomplicated    Rehab Potential Good    PT Frequency 1x / week    PT Duration 4 weeks    PT Treatment/Interventions ADLs/Self Care Home Management;Aquatic Therapy;DME  Instruction;Gait training;Stair training;Functional mobility training;Therapeutic activities;Therapeutic exercise;Balance training;Neuromuscular re-education;Patient/family education    PT Next Visit Plan discuss POC, SOT or compliant surface standing balance.    PT Home Exercise Plan TBD             Patient will benefit from skilled therapeutic  intervention in order to improve the following deficits and impairments:  Abnormal gait, Difficulty walking, Decreased endurance, Decreased activity tolerance, Decreased balance, Decreased strength  Visit Diagnosis: Cerebrovascular accident (CVA) due to thrombosis of precerebral artery (HCC)  Unsteadiness on feet  Other abnormalities of gait and mobility     Problem List Patient Active Problem List   Diagnosis Date Noted   History of ST elevation myocardial infarction (STEMI) 09/26/2020   History of pleural effusion 09/26/2020   Mixed hyperlipidemia 09/26/2020   CVA (cerebral vascular accident) (H[facility]) 08/10/2020   Pleural effusion    Protein-calorie malnutrition, severe 07/25/2020   Acute hypoxemic respiratory failure (HCC) 07/24/2020   Ischemic cardiomyopathy 07/24/2020   Aspiration pneumonia (HCC) 07/24/2020   Pulmonary edema cardiac cause (HCC) 07/24/2020   Acute kidney injury (HCC) 07/24/2020   Shock liver 07/24/2020   Cardiogenic shock (HCC) 07/24/2020   Transaminitis 07/24/2020   Protein calorie malnutrition (HCC) 07/23/2020   Acute ST elevation myocardial infarction (STEMI) due to occlusion of left anterior descending (LAD) coronary artery (HCC) 07/21/2020   Cardiac arrest (HCC) 07/21/2020   STEMI involving left anterior descending coronary artery (HCC) 07/21/2020    Hortencia Conradi, PTA  10/08/20, 12:02 PM   [facility] [address] [place], [place], [address] Phone: [phone]   Fax:  [phone]  Name: Rayshawn Maney MRN: [id number] Date of Birth: Dec 11, 1955

## 2020-10-08 NOTE — Patient Instructions (Signed)
  1. Grip Strengthening (Resistive Putty)   Squeeze putty using thumb and all fingers. Repeat _20___ times. Do __2__ sessions per day.   

## 2020-10-16 ENCOUNTER — Ambulatory Visit (INDEPENDENT_AMBULATORY_CARE_PROVIDER_SITE_OTHER): Payer: Medicare Other | Admitting: Family Medicine

## 2020-10-16 ENCOUNTER — Encounter: Payer: Self-pay | Admitting: Family Medicine

## 2020-10-16 ENCOUNTER — Other Ambulatory Visit: Payer: Self-pay

## 2020-10-16 VITALS — BP 123/83 | HR 75 | Temp 97.3°F | Resp 16 | Ht 65.0 in | Wt 119.0 lb

## 2020-10-16 DIAGNOSIS — Z87891 Personal history of nicotine dependence: Secondary | ICD-10-CM

## 2020-10-16 DIAGNOSIS — E7849 Other hyperlipidemia: Secondary | ICD-10-CM

## 2020-10-16 DIAGNOSIS — Z7689 Persons encountering health services in other specified circumstances: Secondary | ICD-10-CM | POA: Diagnosis not present

## 2020-10-16 DIAGNOSIS — I252 Old myocardial infarction: Secondary | ICD-10-CM

## 2020-10-16 MED ORDER — ROSUVASTATIN CALCIUM 40 MG PO TABS: 40.0000 mg | ORAL_TABLET | Freq: Every day | ORAL | 0 refills | Status: DC

## 2020-10-16 MED ORDER — DIGOXIN 125 MCG PO TABS: 125.0000 ug | ORAL_TABLET | Freq: Every day | ORAL | 0 refills | Status: DC

## 2020-10-16 NOTE — Progress Notes (Signed)
New Patient Office Visit  Subjective:  Patient ID: Vincent [facility]orton, male    DOB: March 06, 1955  Age: 65 y.o. MRN: [id number]  CC:  Chief Complaint  Patient presents with   Establish Care  Vincent Horton [id number]  HPI Vincent Horton presents for to establish care.  Patient was hospitalized recently with an MI.  He reports great improvement since discharge.  He has also stopped smoking.  He has already been scheduled with cardiology for follow-up.  He denies acute complaints or concerns on today.  Visit  was aided by an interpreter.  Past Medical History:  Diagnosis Date   Cardiac arrest [facility])    Coronary artery disease    Dysrhythmia    Myocardial infarction [facility])    Stroke [facility])     Past Surgical History:  Procedure Laterality Date   CARDIAC CATHETERIZATION     CORONARY/GRAFT ACUTE MI REVASCULARIZATION N/A 07/21/2020   Procedure: Coronary/Graft Acute MI Revascularization;  Surgeon: Swaziland, Peter M, MD;  Location: [facility] INVASIVE CV LAB;  Service: Cardiovascular;  Laterality: N/A;   LEFT HEART CATH AND CORONARY ANGIOGRAPHY N/A 07/21/2020   Procedure: LEFT HEART CATH AND CORONARY ANGIOGRAPHY;  Surgeon: Swaziland, Peter M, MD;  Location: [facility] INVASIVE CV LAB;  Service: Cardiovascular;  Laterality: N/A;   RIGHT HEART CATH N/A 07/21/2020   Procedure: RIGHT HEART CATH;  Surgeon: Swaziland, Peter M, MD;  Location: [facility] INVASIVE CV LAB;  Service: Cardiovascular;  Laterality: N/A;    Family History  Family history unknown: Yes    Social History   Socioeconomic History   Marital status: Widowed    Spouse name: Not on file   Number of children: Not on file   Years of education: Not on file   Highest education level: Not on file  Occupational History   Not on file  Tobacco Use   Smoking status: Former    Types: Cigarettes   Smokeless tobacco: Never  Vaping Use   Vaping Use: Unknown  Substance and Sexual Activity   Alcohol use: Not Currently   Drug use: Not Currently   Sexual activity: Not on file   Other Topics Concern   Not on file  Social History Narrative   Lives with his 4 daughters   Right Handed   Drinks 3-4 cups caffeine   Social Determinants of Health   Financial Resource Strain: Not on file  Food Insecurity: Not on file  Transportation Needs: Not on file  Physical Activity: Not on file  Stress: Not on file  Social Connections: Not on file  Intimate Partner Violence: Not on file    ROS Review of Systems  Respiratory:  Negative for shortness of breath.   Cardiovascular:  Negative for chest pain.  All other systems reviewed and are negative.  Objective:   Today's Vitals: BP 123/83 (BP Location: Left Arm, Patient Position: Sitting, Cuff Size: Normal)   Pulse 75   Temp (!) 97.3 F (36.3 C)   Resp 16   Ht 5\' 5"  (1.651 m)   Wt 119 lb (54 kg)   SpO2 99%   BMI 19.80 kg/m   Physical Exam Vitals and nursing note reviewed.  Constitutional:      General: He is not in acute distress. Cardiovascular:     Rate and Rhythm: Normal rate and regular rhythm.     Heart sounds: Normal heart sounds. No murmur heard. Pulmonary:     Effort: Pulmonary effort is normal.     Breath sounds: Normal breath sounds.  Abdominal:     Palpations: Abdomen is soft.     Tenderness: There is no abdominal tenderness.  Neurological:     General: No focal deficit present.     Mental Status: He is alert and oriented to person, place, and time.    Assessment & Plan:   1. History of ST elevation myocardial infarction (STEMI) Keep scheduled follow-up with cardiology.  - rosuvastatin (CRESTOR) 40 MG tablet; Take 1 tablet (40 mg total) by mouth daily.  Dispense: 90 tablet; Refill: 0  2. Other hyperlipidemia Continue present management.  3. Stopped smoking between 1 and 3 months ago Encouraged continued cessation.  4. Encounter to establish care     Outpatient Encounter Medications as of 10/16/2020  Medication Sig   aspirin 81 MG chewable tablet Chew 1 tablet (81 mg total) by  mouth daily.   clonazePAM (KLONOPIN) 0.5 MG disintegrating tablet Take 1 tablet (0.5 mg total) by mouth 3 (three) times daily.   digoxin (LANOXIN) 0.125 MG tablet Take 1 tablet (125 mcg total) by mouth daily.   oxymetazoline (AFRIN NASAL SPRAY) 0.05 % nasal spray Place 1 spray into both nostrils 2 (two) times daily. 1-2 sprays in the affected nostril to stop nosebleed.   rosuvastatin (CRESTOR) 40 MG tablet Take 1 tablet (40 mg total) by mouth daily.   ticagrelor (BRILINTA) 90 MG TABS tablet Take 1 tablet (90 mg total) by mouth 2 (two) times daily.   acetaminophen (TYLENOL) 160 MG/5ML solution Take 20.3 mLs (650 mg total) by mouth every 6 (six) hours as needed for fever.   cephALEXin (KEFLEX) 250 MG capsule Take 1 capsule (250 mg total) by mouth every 8 (eight) hours. (Patient not taking: Reported on 10/16/2020)   nicotine (NICODERM CQ - DOSED IN MG/24 HOURS) 14 mg/24hr patch 14 mg patch daily x2 weeks then 7 mg patch daily x3 weeks and stop (Patient not taking: Reported on 10/16/2020)   oxyCODONE (OXY IR/ROXICODONE) 5 MG immediate release tablet Take 1 tablet (5 mg total) by mouth every 8 (eight) hours as needed for severe pain. (Patient not taking: Reported on 10/16/2020)   No facility-administered encounter medications on file as of 10/16/2020.    Follow-up: Return in about 4 months (around 02/15/2021).   [name], MD

## 2020-10-25 ENCOUNTER — Ambulatory Visit: Payer: Medicare Other | Admitting: Occupational Therapy

## 2020-10-25 ENCOUNTER — Ambulatory Visit: Payer: Medicare Other

## 2020-10-25 ENCOUNTER — Other Ambulatory Visit: Payer: Self-pay

## 2020-10-25 ENCOUNTER — Encounter: Payer: Self-pay | Admitting: Occupational Therapy

## 2020-10-25 DIAGNOSIS — R1312 Dysphagia, oropharyngeal phase: Secondary | ICD-10-CM

## 2020-10-25 DIAGNOSIS — M6281 Muscle weakness (generalized): Secondary | ICD-10-CM

## 2020-10-25 DIAGNOSIS — I6300 Cerebral infarction due to thrombosis of unspecified precerebral artery: Secondary | ICD-10-CM

## 2020-10-25 DIAGNOSIS — I69318 Other symptoms and signs involving cognitive functions following cerebral infarction: Secondary | ICD-10-CM

## 2020-10-25 DIAGNOSIS — R2681 Unsteadiness on feet: Secondary | ICD-10-CM

## 2020-10-25 DIAGNOSIS — R41841 Cognitive communication deficit: Secondary | ICD-10-CM

## 2020-10-25 NOTE — Therapy (Addendum)
[facility] [address] [place], [place], [address] Phone: [phone]   Fax:  [phone]  Speech Language Pathology Treatment/Discharge Summary  Patient Details  Name: Vincent Horton MRN: [id number] Date of Birth: [birth year] Referring Provider (SLP): Dr. Alysia Penna   Encounter Date: 10/25/2020   End of Session - 10/25/20 1553     Visit Number 3    Number of Visits 10    Date for SLP Re-Evaluation 10/29/20    SLP Start Time 1106    SLP Stop Time  1140    SLP Time Calculation (min) 34 min    Activity Tolerance Patient tolerated treatment well             Past Medical History:  Diagnosis Date   Cardiac arrest [facility])    Coronary artery disease    Dysrhythmia    Myocardial infarction [facility])    Stroke [facility])     Past Surgical History:  Procedure Laterality Date   CARDIAC CATHETERIZATION     CORONARY/GRAFT ACUTE MI REVASCULARIZATION N/A 07/21/2020   Procedure: Coronary/Graft Acute MI Revascularization;  Surgeon: Martinique, Peter M, MD;  Location: [place] [facility];  Service: Cardiovascular;  Laterality: N/A;   LEFT HEART CATH AND CORONARY ANGIOGRAPHY N/A 07/21/2020   Procedure: LEFT HEART CATH AND CORONARY ANGIOGRAPHY;  Surgeon: Martinique, Peter M, MD;  Location: [place] [facility] LAB;  Service: Cardiovascular;  Laterality: N/A;   RIGHT HEART CATH N/A 07/21/2020   Procedure: RIGHT HEART CATH;  Surgeon: Martinique, Peter M, MD;  Location: [place] [facility] LAB;  Service: Cardiovascular;  Laterality: N/A;    There were no vitals filed for this visit.   SPEECH THERAPY DISCHARGE SUMMARY  Visits from Start of Care: 3 (including eval)  Current functional level related to goals / functional outcomes: Pt denies difficulty with cognition at home, and states he does not need to work on swallowing.    Remaining deficits: Unable to assess fully at this time.   Education / Equipment: Swallow precautions.   Patient agrees to  discharge. Patient goals were not met. Patient is being discharged due to being pleased with the current functional level..      Subjective Assessment - 10/25/20 1126     Subjective Dtr states pt is coughing when eating and drinking.    Patient is accompained by: Interpreter;Family member   [place]awa [id number]   Currently in Pain? No/denies                   ADULT SLP TREATMENT - 10/25/20 1111       General Information   Behavior/Cognition Alert;Cooperative;Pleasant mood      Treatment Provided   Treatment provided Dysphagia      Dysphagia Treatment   Other treatment/comments Some difficulty getting interpreting - daughter did not tell SLP she was not comfortable interpreting until SLP asked her directly at 11:09. Then iPad interpreter was not plugged in and there was delay getting it to work - started interpreting at 1119. Via interpreter, pt has no issues with swallowing and is not following precautions at home "I swish and this clears anything out." Pt does not think he gets distracted at mealtime. He does not think he needs to work on swallowing.      Cognitive-Linquistic Treatment   Treatment focused on Cognition    Skilled Treatment VIa interpreter, pt told SLP that while in hospital pt took 9 meds, now it is three, Pt takes one AM med, one  med at 2pm, and one med at 9pm (bedtime). Pt denies skips or misses with meds since last ST visit over two weeks ago, daughter confirms this. Pt stated family gave him meds initially upon d/c - SLP reiterated that at that time was concern for pt doing meds correctly. Daughter Karle Plumber acknowledged this was true. Pt does not think anything out of the ordinary has happened with him cognitively since last visit and this is his only scheduled folllow up ST. At 1140 clinic lost power/internet and interpretation stopped due to no internet. SLP stopped session at that point.      Assessment / Recommendations / Plan   Plan --   pt excited for discharge  today - only scheduled one follow up ST, OT, PT     Dysphagia Recommendations   Diet recommendations Regular;Thin liquid    Compensations Slow rate;Small sips/bites      Progression Toward Goals   Progression toward goals Progressing toward goals   per report of daughter and pt             SLP Education - 10/25/20 1552     Education Details swallow precautions    Person(s) Educated Patient;Child(ren)    Methods Explanation    Comprehension --   pt does not think he needs to work on swallowing             SLP Short Term Goals - 10/25/20 1138       [place] #1   Title Complete clinical swallow evaluation    Time 1    Period Weeks    Status Achieved      SLP SHORT TERM GOAL #2   Title Verbalize 2 cognitive impairments with rare min A    Time 3    Period Weeks    Status Unable to assess   denies cognitive deficits     SLP SHORT TERM GOAL #3   Title ID safe vs unsafe decisions at home and with chores with occasional min A    Time 3    Period Weeks    Status Unable to assess      SLP SHORT TERM GOAL #4   Title Complete med sorting task with occasional min A and ID any errors over 2 sessoins    Time 3    Period Weeks    Status Unable to assess      SLP SHORT TERM GOAL #5   Title Follow swallow precautions and diet modifications with rare min A over 2 sessions    Time 3    Period Weeks    Status Unable to assess   pt states he does not need to work on swallowing             SLP Long Term Goals - 10/25/20 1555       SLP LONG TERM GOAL #1   Title Pt will manage either am or pm meds with supervision from family    Time 6    Period Weeks    Status Unable to assess   pt is satisfied with current function; denies need for swallowing tx and states his cognition is at baseline     SLP [place] #2   Title Pt will carryover 3 strategies for attention, memory and self monitoring for 3 household chores with rare min A from family    Time 6     Period Weeks    Status Unable to assess  SLP LONG TERM GOAL #3   Title Pt will follow diet modifications and swallow precautions with supervision cues    Time 6    Period Weeks    Status Unable to assess              Plan - 10/25/20 1553     Clinical Impression Statement Pt is excited about this as his final visit, is requesting d/c. He denies the need to work with swallowing, and he and daughter report cognition appears at baseline. He will be d/c'd today.    Treatment/Interventions Aspiration precaution training;Environmental controls;Cueing hierarchy;SLP instruction and feedback;Compensatory strategies;Functional tasks;Cognitive reorganization;Compensatory techniques;Pharyngeal strengthening exercises;Diet toleration management by SLP;Trials of upgraded texture/liquids;Internal/external aids;Multimodal communcation approach;Patient/family education;Other (comment)   repeat objective swallow study if indicated   Potential to Achieve Goals Good             Patient will benefit from skilled therapeutic intervention in order to improve the following deficits and impairments:   Cognitive communication deficit  Dysphagia, oropharyngeal phase    Problem List Patient Active Problem List   Diagnosis Date Noted   History of ST elevation myocardial infarction (STEMI) 09/26/2020   History of pleural effusion 09/26/2020   Mixed hyperlipidemia 09/26/2020   CVA (cerebral vascular accident) ([place]) 08/10/2020   Pleural effusion    Protein-calorie malnutrition, severe 07/25/2020   Acute hypoxemic respiratory failure ([place]) 07/24/2020   Ischemic cardiomyopathy 07/24/2020   Aspiration pneumonia ([place]) 07/24/2020   Pulmonary edema cardiac cause ([place]) 07/24/2020   Acute kidney injury ([place]) 07/24/2020   Shock liver 07/24/2020   Cardiogenic shock ([place]) 07/24/2020   Transaminitis 07/24/2020   Protein calorie malnutrition ([place]) 07/23/2020   Acute ST elevation myocardial infarction  (STEMI) due to occlusion of left anterior descending (LAD) coronary artery ([place]) 07/21/2020   Cardiac arrest ([place]) 07/21/2020   STEMI involving left anterior descending coronary artery ([place]) 07/21/2020    [facility] ,[place], [place]  10/25/2020, 4:02 PM  [facility] [address] [place], [place], [address] Phone: [phone]   Fax:  [phone]   Name: Vincent Horton MRN: [id number] Date of Birth: Sep 01, 1955

## 2020-10-25 NOTE — Therapy (Signed)
[facility] [address] [place], [place], [address] Phone: [phone]   Fax:  [phone]  Occupational Therapy Treatment  Patient Details  Name: Vincent Horton MRN: [id number] Date of Birth: 08-03-1955 No data recorded  Encounter Date: 10/25/2020   OT End of Session - 10/25/20 1258     Visit Number 3    Number of Visits 9    Date for OT Re-Evaluation 11/02/20    Authorization Type MCR/MCD    Progress Note Due on Visit 10    OT Start Time 1235    OT Stop Time 1255    OT Time Calculation (min) 20 min    Activity Tolerance Patient tolerated treatment well    Behavior During Therapy [facility] for tasks assessed/performed             Past Medical History:  Diagnosis Date   Cardiac arrest [facility])    Coronary artery disease    Dysrhythmia    Myocardial infarction [facility])    Stroke [facility])     Past Surgical History:  Procedure Laterality Date   CARDIAC CATHETERIZATION     CORONARY/GRAFT ACUTE MI REVASCULARIZATION N/A 07/21/2020   Procedure: Coronary/Graft Acute MI Revascularization;  Surgeon: Martinique, Peter M, MD;  Location: [place] [facility] LAB;  Service: Cardiovascular;  Laterality: N/A;   LEFT HEART CATH AND CORONARY ANGIOGRAPHY N/A 07/21/2020   Procedure: LEFT HEART CATH AND CORONARY ANGIOGRAPHY;  Surgeon: Martinique, Peter M, MD;  Location: [place] [facility];  Service: Cardiovascular;  Laterality: N/A;   RIGHT HEART CATH N/A 07/21/2020   Procedure: RIGHT HEART CATH;  Surgeon: Martinique, Peter M, MD;  Location: [place] [facility] LAB;  Service: Cardiovascular;  Laterality: N/A;    There were no vitals filed for this visit.   Subjective Assessment - 10/25/20 1236     Subjective  Today I think is my last day    Patient is accompanied by: Family member   daughter   Currently in Pain? No/denies    Pain Score 0-No pain                          OT Treatments/Exercises (OP) - 10/25/20 0001       ADLs   Cooking  Patient's daughter does most of the cooking.  Patient ahs safely cooked grits for himself.    Medication Management Patient's daughter reports much improvement with medication management - if they put medications out, he takes them at the right times.    ADL Comments Patient very eager to complete therapy.  Stating I am done today - last one!  Patient here with daughter, and both report he is at baseline.  He has met OT goals per patient and daighter's report, and is excited for discharge.                      OT Short Term Goals - 09/17/20 1416       OT SHORT TERM GOAL #1   Title STG's = LTG's               OT Long Term Goals - 10/25/20 1237       OT LONG TERM GOAL #1   Title Independent with putty HEP for Rt hand    Time 6    Period Weeks    Status Achieved      OT LONG TERM GOAL #2   Title Environmental scanning with  80% accuracy or higher    Period Weeks    Status Partially Met      OT LONG TERM GOAL #3   Title Pt to return to simple cooking w/ distant supervision    Time 6    Period Weeks    Status Achieved      OT LONG TERM GOAL #4   Title Pt to participate in medication managment with supervision    Time 6    Period Weeks    Status Achieved                   Plan - 10/25/20 1258     Clinical Impression Statement Patient requesting to be d/c'd from OT.  Per patient's daughter he is at baseline.  He has met OT goals.    OT Occupational Profile and History Detailed Assessment- Review of Records and additional review of physical, cognitive, psychosocial history related to current functional performance    Occupational performance deficits (Please refer to evaluation for details): ADL's;IADL's;Leisure    Body Structure / Function / Physical Skills Dexterity;Strength;UE functional use;IADL;Coordination;Mobility    Cognitive Skills Safety Awareness;Problem Solve;Memory    Rehab Potential Good    Clinical Decision Making Several treatment  options, min-mod task modification necessary    Comorbidities Affecting Occupational Performance: May have comorbidities impacting occupational performance    Modification or Assistance to Complete Evaluation  Min-Moderate modification of tasks or assist with assess necessary to complete eval    OT Frequency 2x / week    OT Duration 4 weeks   or 8 visits over 6 weeks (d/t scheduling conflicts)   OT Treatment/Interventions Self-care/ADL training;DME and/or AE instruction;Therapeutic activities;Therapeutic exercise;Cognitive remediation/compensation;Functional Mobility Training;Neuromuscular education;Patient/family education    Plan discharge    Consulted and Agree with Plan of Care Patient;Family member/caregiver    Family Member Consulted daughter             Patient will benefit from skilled therapeutic intervention in order to improve the following deficits and impairments:   Body Structure / Function / Physical Skills: Dexterity, Strength, UE functional use, IADL, Coordination, Mobility Cognitive Skills: Safety Awareness, Problem Solve, Memory     Visit Diagnosis: Muscle weakness (generalized)  Unsteadiness on feet  Other symptoms and signs involving cognitive functions following cerebral infarction    Problem List Patient Active Problem List   Diagnosis Date Noted   History of ST elevation myocardial infarction (STEMI) 09/26/2020   History of pleural effusion 09/26/2020   Mixed hyperlipidemia 09/26/2020   CVA (cerebral vascular accident) ([place]) 08/10/2020   Pleural effusion    Protein-calorie malnutrition, severe 07/25/2020   Acute hypoxemic respiratory failure ([place]) 07/24/2020   Ischemic cardiomyopathy 07/24/2020   Aspiration pneumonia ([place]) 07/24/2020   Pulmonary edema cardiac cause ([place]) 07/24/2020   Acute kidney injury ([place]) 07/24/2020   Shock liver 07/24/2020   Cardiogenic shock ([place]) 07/24/2020   Transaminitis 07/24/2020   Protein calorie malnutrition ([place])  07/23/2020   Acute ST elevation myocardial infarction (STEMI) due to occlusion of left anterior descending (LAD) coronary artery ([place]) 07/21/2020   Cardiac arrest ([place]) 07/21/2020   STEMI involving left anterior descending coronary artery ([place]) 07/21/2020  OCCUPATIONAL THERAPY DISCHARGE SUMMARY  Visits from Start of Care: 3  Current functional level related to goals / functional outcomes: Improved performance of ADL/IADL   Remaining deficits: Cognition - attention, awareness, mild visual scanning deficits   Education / Equipment: HEP putty   Patient agrees to discharge. Patient goals were met. Patient  is being discharged due to meeting the stated rehab goals.Mariah Milling, OTR/L 10/25/2020, 1:00 PM  [facility] [address] [place], [place], [address] Phone: [phone]   Fax:  [phone]  Name: Vincent Horton MRN: [id number] Date of Birth: 18-Nov-1955

## 2020-10-25 NOTE — Therapy (Signed)
[facility] [address] [place], [place], [address] Phone: [phone]   Fax:  [phone]  Physical Therapy Treatment/DC Summary  Patient Details  Name: Vincent Horton MRN: [id number] Date of Birth: [birth year] No data recorded  Encounter Date: 10/25/2020   Visits from Start of Care: 3  Current functional level related to goals / functional outcomes: Goals met   Remaining deficits: None regarding strength or balance   Education / Equipment: Walking program established   Patient agrees to discharge. Patient goals were met. Patient is being discharged due to being pleased with the current functional level.    Past Medical History:  Diagnosis Date   Cardiac arrest [facility])    Coronary artery disease    Dysrhythmia    Myocardial infarction [facility])    Stroke [facility])     Past Surgical History:  Procedure Laterality Date   CARDIAC CATHETERIZATION     CORONARY/GRAFT ACUTE MI REVASCULARIZATION N/A 07/21/2020   Procedure: Coronary/Graft Acute MI Revascularization;  Surgeon: Martinique, Peter M, MD;  Location: [place] [facility];  Service: Cardiovascular;  Laterality: N/A;   LEFT HEART CATH AND CORONARY ANGIOGRAPHY N/A 07/21/2020   Procedure: LEFT HEART CATH AND CORONARY ANGIOGRAPHY;  Surgeon: Martinique, Peter M, MD;  Location: [place] [facility];  Service: Cardiovascular;  Laterality: N/A;   RIGHT HEART CATH N/A 07/21/2020   Procedure: RIGHT HEART CATH;  Surgeon: Martinique, Peter M, MD;  Location: [place] [facility] LAB;  Service: Cardiovascular;  Laterality: N/A;    There were no vitals filed for this visit.   Subjective Assessment - 10/25/20 1150     Subjective No AD needed today, reports he is 100%.  Family member agrees    Patient is accompained by: Family member    Pertinent History Pt is a 65 y/o male seen on 07/21/2020 for evaluation of STEMI. Upon EMS arrival, pt apneic and was in VFib and shocked 5 times with 15-min CPR before ROSC  achieved. Pt intubated on site (5/21) and transferred to [facility]. Recent CXR findings reveal diffuse bilateral pulmonary infiltrates/edema, progressed from  prior exam. Bibasilar atelectasis. Small right pleural effusion. Pt extubated on 07/31/2020. MRI done on 08/07/20 due to persistent AMS post cardiac arrest showing punctate acute to subacute Lt front infarct. Inpatient rehab 6/10 - 08/24/20. PMH includes cardiac arrest, CAD, dysrhythmia, myocardial infarction.    How long can you sit comfortably? >30 min    How long can you stand comfortably? >15 min    How long can you walk comfortably? > 30 min                               [facility] Adult PT Treatment/Exercise - 10/25/20 0001       Transfers   Transfers Sit to Stand    Five time sit to stand comments  11.2s    Comments no UEs used      Ambulation/Gait   Gait velocity 1.1 m/s                    PT Education - 10/25/20 1205     Education Details Discussed need to continue walking program.    Person(s) Educated Patient;Caregiver(s)    Methods Explanation    Comprehension Verbalized understanding              PT Short Term Goals - 10/25/20 1152       PT SHORT  TERM GOAL #1   Title Instruct in HEP    Baseline TBD; 10/25/20 Patient walking 2x/day greater than 20 min    Time 3    Period Weeks    Status Achieved    Target Date 10/09/20      PT SHORT TERM GOAL #2   Title Assess FGA and set goal as needed.    Baseline TBD; 10/25/20 FGA 29/30, no goal needed    Time 2    Period Weeks    Status Achieved    Target Date 10/09/20               PT Long Term Goals - 10/25/20 1153       PT LONG TERM GOAL #1   Title Assess progress towards FGA.    Baseline TBD; 10/25/20 FGA score 29 at previous session, no goal needed    Time 4    Period Days    Status Achieved      PT LONG TERM GOAL #2   Title I in floor transfers    Baseline TBD; 10/25/20 Able to get on/off of floor independently and safely     Time 4    Period Weeks    Status Achieved      PT LONG TERM GOAL #3   Title Patient to demo 0.8 m/s gait speed    Baseline TBD; 10/25/20 Gait speed 1.1 m/s    Time 4    Period Weeks    Status Achieved      PT LONG TERM GOAL #4   Title Patient to negotiate full flight of steps with most appropriate pattern.    Baseline TBD; Patient able to negotiate 16 steps w/o need of rail with step through pattern    Time 4    Period Weeks    Status Achieved                   Plan - 10/25/20 1225     Clinical Impression Statement All rehab goals met, no skilled PT needs identified, no defcits noted in strength, balanc eor mobility    Personal Factors and Comorbidities Comorbidity 1    Comorbidities L infarct s/p MI    Examination-Activity Limitations Stairs;Locomotion Level    Stability/Clinical Decision Making Stable/Uncomplicated    Rehab Potential Good    PT Frequency 1x / week    PT Duration 4 weeks    PT Treatment/Interventions ADLs/Self Care Home Management;Aquatic Therapy;DME Instruction;Gait training;Stair training;Functional mobility training;Therapeutic activities;Therapeutic exercise;Balance training;Neuromuscular re-education;Patient/family education    PT Next Visit Plan DC to independent management    PT Home Exercise Plan Independent walking program             Patient will benefit from skilled therapeutic intervention in order to improve the following deficits and impairments:  Abnormal gait, Difficulty walking, Decreased endurance, Decreased activity tolerance, Decreased balance, Decreased strength  Visit Diagnosis: Muscle weakness (generalized)  Cerebrovascular accident (CVA) due to thrombosis of precerebral artery ([place])  Unsteadiness on feet     Problem List Patient Active Problem List   Diagnosis Date Noted   History of ST elevation myocardial infarction (STEMI) 09/26/2020   History of pleural effusion 09/26/2020   Mixed hyperlipidemia 09/26/2020    CVA (cerebral vascular accident) ([place]) 08/10/2020   Pleural effusion    Protein-calorie malnutrition, severe 07/25/2020   Acute hypoxemic respiratory failure ([place]) 07/24/2020   Ischemic cardiomyopathy 07/24/2020   Aspiration pneumonia ([place]) 07/24/2020   Pulmonary edema cardiac cause ([place])  07/24/2020   Acute kidney injury ([place]) 07/24/2020   Shock liver 07/24/2020   Cardiogenic shock ([place]) 07/24/2020   Transaminitis 07/24/2020   Protein calorie malnutrition ([place]) 07/23/2020   Acute ST elevation myocardial infarction (STEMI) due to occlusion of left anterior descending (LAD) coronary artery ([place]) 07/21/2020   Cardiac arrest ([place]) 07/21/2020   STEMI involving left anterior descending coronary artery ([place]) 07/21/2020    Lanice Shirts PT 10/25/2020, 12:27 PM  [facility] [address] [place], [place], [address] Phone: [phone]   Fax:  [phone]  Name: Vincent Horton MRN: [id number] Date of Birth: 1955-03-04

## 2020-11-01 ENCOUNTER — Ambulatory Visit: Payer: Medicare Other | Admitting: Family

## 2020-11-08 NOTE — Progress Notes (Signed)
Cardiology Office Note:    Date:  11/09/2020   ID:  [name], DOB 14-Jun-1955, MRN [id number]  PCP:  Pcp, No   [facility] Providers Cardiologist:  Shirlee Latch  }   Referring MD: No ref. provider found   Chief Complaint  Patient presents with   Coronary Artery Disease   Congestive Heart Failure     History of Present Illness:    Vincent Horton is a 65 y.o. male with a hx of CAD, VF arrest,  CHF She was seen by Dr Swaziland and Dr. Shirlee Latch in the hospital .  Seen with his son, Karie Mainland  He presented in VF arrest.  He had an emergent heart catheterization which revealed an occluded left anterior descending artery.  This was stented by Dr. Swaziland.  Feeling better  Is sore in his ribs.  Likely from getting CPR  Walks some  His mentation is close to normal   Echo from June 10 shows EF 35-40%.   Is taking ASA He stopped taking the Brilinta ( at the recommendation of his primary care, Georganna Skeans ) perhaps due to hematuria  His last lab work from September 11, 2020 reveals a glucose of 193.  His creatinine is normal at 0.64.  His HDL was 70.  LDL is 40.  Triglycerides are 80.  Total cholesterol is 126.  Has stopped smoking .   Past Medical History:  Diagnosis Date   Cardiac arrest [facility])    Coronary artery disease    Dysrhythmia    Myocardial infarction [facility])    Stroke [facility])     Past Surgical History:  Procedure Laterality Date   CARDIAC CATHETERIZATION     CORONARY/GRAFT ACUTE MI REVASCULARIZATION N/A 07/21/2020   Procedure: Coronary/Graft Acute MI Revascularization;  Surgeon: Swaziland, Peter M, MD;  Location: [facility] INVASIVE CV LAB;  Service: Cardiovascular;  Laterality: N/A;   LEFT HEART CATH AND CORONARY ANGIOGRAPHY N/A 07/21/2020   Procedure: LEFT HEART CATH AND CORONARY ANGIOGRAPHY;  Surgeon: Swaziland, Peter M, MD;  Location: [facility] INVASIVE CV LAB;  Service: Cardiovascular;  Laterality: N/A;   RIGHT HEART CATH N/A 07/21/2020   Procedure: RIGHT HEART CATH;  Surgeon: Swaziland, Peter M, MD;   Location: [facility] INVASIVE CV LAB;  Service: Cardiovascular;  Laterality: N/A;    Current Medications: Current Meds  Medication Sig   aspirin 81 MG chewable tablet Chew 1 tablet (81 mg total) by mouth daily.   clopidogrel (PLAVIX) 75 MG tablet Take 1 tablet (75 mg total) by mouth daily.   digoxin (LANOXIN) 0.125 MG tablet Take 1 tablet (125 mcg total) by mouth daily.   rosuvastatin (CRESTOR) 40 MG tablet Take 1 tablet (40 mg total) by mouth daily.   valsartan (DIOVAN) 40 MG tablet Take 1 tablet (40 mg total) by mouth daily.     Allergies:   Beef-derived products, Pork-derived products, and Poultry meal   Social History   Socioeconomic History   Marital status: Widowed    Spouse name: Not on file   Number of children: Not on file   Years of education: Not on file   Highest education level: Not on file  Occupational History   Not on file  Tobacco Use   Smoking status: Former    Types: Cigarettes   Smokeless tobacco: Never  Vaping Use   Vaping Use: Unknown  Substance and Sexual Activity   Alcohol use: Not Currently   Drug use: Not Currently   Sexual activity: Not on file  Other Topics Concern  Not on file  Social History Narrative   Lives with his 4 daughters   Right Handed   Drinks 3-4 cups caffeine   Social Determinants of Health   Financial Resource Strain: Not on file  Food Insecurity: Not on file  Transportation Needs: Not on file  Physical Activity: Not on file  Stress: Not on file  Social Connections: Not on file     Family History: The patient's Family history is unknown by patient.  ROS:   Please see the history of present illness.     All other systems reviewed and are negative.  EKGs/Labs/Other Studies Reviewed:    The following studies were reviewed today:   EKG:    Recent Labs: 08/08/2020: Magnesium 2.2 08/20/2020: ALT 26 09/11/2020: BUN 7; Creatinine, Ser 0.64; Hemoglobin 12.3; Platelets 279; Potassium 4.0; Sodium 135  Recent Lipid Panel     Component Value Date/Time   CHOL 126 09/13/2020 1351   TRIG 80 09/13/2020 1351   HDL 70 09/13/2020 1351   CHOLHDL 1.8 09/13/2020 1351   CHOLHDL 4.9 07/21/2020 0109   VLDL 29 07/21/2020 0109   LDLCALC 40 09/13/2020 1351     Risk Assessment/Calculations:           Physical Exam:    VS:  BP 120/78   Pulse 89   Ht 5\' 5"  (1.651 m)   Wt 121 lb 6.4 oz (55.1 kg)   SpO2 99%   BMI 20.20 kg/m     Wt Readings from Last 3 Encounters:  11/09/20 121 lb 6.4 oz (55.1 kg)  10/16/20 119 lb (54 kg)  09/28/20 113 lb (51.3 kg)     GEN:  Well nourished, well developed in no acute distress HEENT: Normal NECK: No JVD; No carotid bruits LYMPHATICS: No lymphadenopathy CARDIAC: RRR, no murmurs, rubs, gallops RESPIRATORY:  Clear to auscultation without rales, wheezing or rhonchi  ABDOMEN: Soft, non-tender, non-distended MUSCULOSKELETAL:  No edema; No deformity  SKIN: Warm and dry NEUROLOGIC:  Alert and oriented x 3 PSYCHIATRIC:  Normal affect   ASSESSMENT:    1. Acute combined systolic and diastolic heart failure (HCC)    PLAN:    In order of problems listed above:  Coronary artery disease: Somehow he has stopped his Brilinta.  He thinks this was last month but the son is not sure why.  We will restart him on Plavix 75 mg a day.  Continue aspirin.  Continue high-dose Crestor.  2.  Acute on chronic combined systolic and diastolic congestive heart failure: He seems to be progressing well.  His last ejection fraction was 30 to 35%.  His blood pressure is a bit better now.  We will start him on valsartan 40 mg a day.  We will recheck basic metabolic profile and CBC in 3 weeks.  Continue digoxin for now.  I will see him again in 6 months we will plan on getting an echocardiogram at that time.   3.  Hyperlipidemia: Continue Crestor.  His lipids look great.          Medication Adjustments/Labs and Tests Ordered: Current medicines are reviewed at length with the patient today.  Concerns  regarding medicines are outlined above.  Orders Placed This Encounter  Procedures   Basic metabolic panel   CBC   ECHOCARDIOGRAM COMPLETE    Meds ordered this encounter  Medications   clopidogrel (PLAVIX) 75 MG tablet    Sig: Take 1 tablet (75 mg total) by mouth daily.    Dispense:  90 tablet    Refill:  3   valsartan (DIOVAN) 40 MG tablet    Sig: Take 1 tablet (40 mg total) by mouth daily.    Dispense:  90 tablet    Refill:  3     Patient Instructions  Medication Instructions:  Your physician has recommended you make the following change in your medication:  1.) Stay off Brilinta 2.) Start clopidogrel (Plavix) 75 mg - take one tablet daily EXCEPT FOR THE FIRST DAY - TAKE 2 TABLETS  *If you need a refill on your cardiac medications before your next appointment, please call your pharmacy*   Lab Work: PLEASE RETURN IN 3 WEEKS FOR LABS: CBC, BMET  If you have labs (blood work) drawn today and your tests are completely normal, you will receive your results only by: MyChart Message (if you have MyChart) OR A paper copy in the mail If you have any lab test that is abnormal or we need to change your treatment, we will call you to review the results.   Testing/Procedures:  Echo due March 2023 Your physician has requested that you have an echocardiogram. Echocardiography is a painless test that uses sound waves to create images of your heart. It provides your doctor with information about the size and shape of your heart and how well your heart's chambers and valves are working. This procedure takes approximately one hour. There are no restrictions for this procedure.    Follow-Up: At [facility], you and your health needs are our priority.  As part of our continuing mission to provide you with exceptional heart care, we have created designated Provider Care Teams.  These Care Teams include your primary Cardiologist (physician) and Advanced Practice Providers (APPs -  Physician  Assistants and Nurse Practitioners) who all work together to provide you with the care you need, when you need it.   Your next appointment:   6 month(s)  The format for your next appointment:   In Person  Provider:   You may see Kristeen Miss, MD or one of the following Advanced Practice Providers on your designated Care Team:   Tereso Newcomer, PA-C Chelsea Aus, [place]   Other Instructions     Signed, Kristeen Miss, MD  11/09/2020 9:15 AM    [facility]

## 2020-11-09 ENCOUNTER — Ambulatory Visit (INDEPENDENT_AMBULATORY_CARE_PROVIDER_SITE_OTHER): Payer: Medicare Other | Admitting: Cardiovascular Disease

## 2020-11-09 ENCOUNTER — Encounter: Payer: Self-pay | Admitting: Cardiovascular Disease

## 2020-11-09 ENCOUNTER — Other Ambulatory Visit: Payer: Self-pay

## 2020-11-09 VITALS — BP 120/78 | HR 89 | Ht 65.0 in | Wt 121.4 lb

## 2020-11-09 DIAGNOSIS — I5041 Acute combined systolic (congestive) and diastolic (congestive) heart failure: Secondary | ICD-10-CM | POA: Diagnosis not present

## 2020-11-09 DIAGNOSIS — I255 Ischemic cardiomyopathy: Secondary | ICD-10-CM

## 2020-11-09 MED ORDER — CLOPIDOGREL BISULFATE 75 MG PO TABS: 75.0000 mg | ORAL_TABLET | Freq: Every day | ORAL | 3 refills | Status: DC

## 2020-11-09 MED ORDER — VALSARTAN 40 MG PO TABS: 40.0000 mg | ORAL_TABLET | Freq: Every day | ORAL | 3 refills | Status: DC

## 2020-11-09 NOTE — Patient Instructions (Addendum)
Medication Instructions:  Your physician has recommended you make the following change in your medication:  1.) Stay off Brilinta 2.) Start clopidogrel (Plavix) 75 mg - take one tablet daily EXCEPT FOR THE FIRST DAY - TAKE 2 TABLETS  *If you need a refill on your cardiac medications before your next appointment, please call your pharmacy*   Lab Work: PLEASE RETURN IN 3 WEEKS FOR LABS: CBC, BMET  If you have labs (blood work) drawn today and your tests are completely normal, you will receive your results only by: MyChart Message (if you have MyChart) OR A paper copy in the mail If you have any lab test that is abnormal or we need to change your treatment, we will call you to review the results.   Testing/Procedures:  Echo due March 2023 Your physician has requested that you have an echocardiogram. Echocardiography is a painless test that uses sound waves to create images of your heart. It provides your doctor with information about the size and shape of your heart and how well your heart's chambers and valves are working. This procedure takes approximately one hour. There are no restrictions for this procedure.    Follow-Up: At [facility], you and your health needs are our priority.  As part of our continuing mission to provide you with exceptional heart care, we have created designated Provider Care Teams.  These Care Teams include your primary Cardiologist (physician) and Advanced Practice Providers (APPs -  Physician Assistants and Nurse Practitioners) who all work together to provide you with the care you need, when you need it.   Your next appointment:   6 month(s)  The format for your next appointment:   In Person  Provider:   You may see Kristeen Miss, MD or one of the following Advanced Practice Providers on your designated Care Team:   Tereso Newcomer, PA-C Chelsea Aus, [place]   Other Instructions

## 2020-11-12 ENCOUNTER — Ambulatory Visit: Payer: Medicare Other | Admitting: Family

## 2020-11-26 ENCOUNTER — Other Ambulatory Visit: Payer: Medicare Other | Admitting: *Deleted

## 2020-11-26 ENCOUNTER — Other Ambulatory Visit: Payer: Self-pay

## 2020-11-26 DIAGNOSIS — I5041 Acute combined systolic (congestive) and diastolic (congestive) heart failure: Secondary | ICD-10-CM

## 2020-11-26 LAB — CBC
Hematocrit: 39.8 % (ref 37.5–51.0)
Hemoglobin: 13.2 g/dL (ref 13.0–17.7)
MCH: 30 pg (ref 26.6–33.0)
MCHC: 33.2 g/dL (ref 31.5–35.7)
MCV: 91 fL (ref 79–97)
Platelets: 241 10*3/uL (ref 150–450)
RBC: 4.4 x10E6/uL (ref 4.14–5.80)
RDW: 13.8 % (ref 11.6–15.4)
WBC: 5 10*3/uL (ref 3.4–10.8)

## 2020-11-26 LAB — BASIC METABOLIC PANEL
BUN/Creatinine Ratio: 13 (ref 10–24)
BUN: 10 mg/dL (ref 8–27)
CO2: 18 mmol/L — ABNORMAL LOW (ref 20–29)
Calcium: 8.8 mg/dL (ref 8.6–10.2)
Chloride: 107 mmol/L — ABNORMAL HIGH (ref 96–106)
Creatinine, Ser: 0.77 mg/dL (ref 0.76–1.27)
Glucose: 87 mg/dL (ref 70–99)
Potassium: 4.3 mmol/L (ref 3.5–5.2)
Sodium: 140 mmol/L (ref 134–144)
eGFR: 99 mL/min/{1.73_m2} (ref >59)

## 2020-12-10 ENCOUNTER — Encounter (HOSPITAL_COMMUNITY): Payer: Medicare Other

## 2020-12-12 NOTE — Progress Notes (Signed)
ADVANCED HF CLINIC CONSULT NOTE  Primary Care: Dr. Larina Bras Primary Cardiologist: Dr. Elease Hashimoto HF Cardiologist: Dr. Shirlee Latch  HPI: Vincent Horton is a 65 y.o.male with a hx of CAD, VF arrest, and systolic CHF/iCM.  He was admitted 07/21/20 with Vfib arrest.  He had an emergent heart catheterization which revealed an occluded left anterior descending artery.  This was stented by Dr. Swaziland and he was started on ASA + Brilinta. Echocardiogram showed EF of 30 to 35% with moderately decreased LV function. He was kept in the ICU and required vasopressors for hypotension and respiratory failure needing ventilatory support.  He developed aspiration pneumonia and right pleural effusion requiring thoracocentesis with removal of 450 cc of fluid. He developed altered mental status and MRI scan showed punctate acute to subacute frontal infarct, but no evidence of hypoxic ischemic brain injury. There was no definite atrial fibrillation inpatient on telemetry.  He was transferred to [facility] for rehab with Zio 14 day monitor.     Follow up with general cardiology 9/22, he was off his Brilinta for unclear reasons. Plavix and valsartan started this visit.  Today he returns for post hospitalization HF follow up with his daughter. He walks daily for 30-45 minutes with no dyspnea. Overall feeling fine. Denies CP, dizziness, edema, or PND/Orthopnea. Appetite ok. No fever or chills. Weight at home 120-124 pounds. Taking all medications. He finished PT/OT at home this summer. He quit smoking 4 months ago. He does not want to take anymore medications.   Review of Systems: [y] = yes, [ ]  = no   General: Weight gain [ ] ; Weight loss [ ] ; Anorexia [ ] ; Fatigue [ ] ; Fever [ ] ; Chills [ ] ; Weakness [ ]   Cardiac: Chest pain/pressure [ ] ; Resting SOB [ ] ; Exertional SOB [ ] ; Orthopnea [ ] ; Pedal Edema [ ] ; Palpitations [ ] ; Syncope [ ] ; Presyncope [ ] ; Paroxysmal nocturnal dyspnea[ ]   Pulmonary: Cough [ ] ; Wheezing[ ] ; Hemoptysis[  ]; Sputum [ ] ; Snoring [ ]   GI: Vomiting[ ] ; Dysphagia[ ] ; Melena[ ] ; Hematochezia [ ] ; Heartburn[ ] ; Abdominal pain [ ] ; Constipation [ ] ; Diarrhea [ ] ; BRBPR [ ]   GU: Hematuria[ ] ; Dysuria [ ] ; Nocturia[ ]   Vascular: Pain in legs with walking [ ] ; Pain in feet with lying flat [ ] ; Non-healing sores [ ] ; Stroke [ ] ; TIA [ ] ; Slurred speech [ ] ;  Neuro: Headaches[ ] ; Vertigo[ ] ; Seizures[ ] ; Paresthesias[ ] ;Blurred vision [ ] ; Diplopia [ ] ; Vision changes [ ]   Ortho/Skin: Arthritis [ ] ; Joint pain [ ] ; Muscle pain [ ] ; Joint swelling [ ] ; Back Pain [ ] ; Rash [ ]   Psych: Depression[ ] ; Anxiety[ ]   Heme: Bleeding problems [ ] ; Clotting disorders [ ] ; Anemia [ ]   Endocrine: Diabetes ]; Thyroid dysfunction[ ]   Past Medical History:  Diagnosis Date   Cardiac arrest [facility])    Coronary artery disease    Dysrhythmia    Myocardial infarction [facility])    Stroke [facility])     Current Outpatient Medications  Medication Sig Dispense Refill   acetaminophen (TYLENOL) 160 MG/5ML solution Take 20.3 mLs (650 mg total) by mouth every 6 (six) hours as needed for fever. 120 mL 0   aspirin 81 MG chewable tablet Chew 1 tablet (81 mg total) by mouth daily.     clopidogrel (PLAVIX) 75 MG tablet Take 1 tablet (75 mg total) by mouth daily. 90 tablet 3   digoxin (LANOXIN) 0.125 MG tablet Take 1  tablet (125 mcg total) by mouth daily. 90 tablet 0   rosuvastatin (CRESTOR) 40 MG tablet Take 1 tablet (40 mg total) by mouth daily. 90 tablet 0   valsartan (DIOVAN) 40 MG tablet Take 1 tablet (40 mg total) by mouth daily. 90 tablet 3   No current facility-administered medications for this encounter.   Allergies  Allergen Reactions   Beef-Derived Products Other (See Comments)    Patient is Muslim - cannot get ANY beef, pork, or poultry products (food and medications included)   Pork-Derived Products Other (See Comments)    Patient is Muslim - cannot get ANY beef, pork, or poultry products (food and medications included)    Poultry Meal Other (See Comments)    Patient is Muslim - cannot get ANY beef, pork, or poultry products (food and medications included)   Social History   Socioeconomic History   Marital status: Widowed    Spouse name: Not on file   Number of children: Not on file   Years of education: Not on file   Highest education level: Not on file  Occupational History   Not on file  Tobacco Use   Smoking status: Former    Types: Cigarettes   Smokeless tobacco: Never  Vaping Use   Vaping Use: Unknown  Substance and Sexual Activity   Alcohol use: Not Currently   Drug use: Not Currently   Sexual activity: Not on file  Other Topics Concern   Not on file  Social History Narrative   Lives with his 4 daughters   Right Handed   Drinks 3-4 cups caffeine   Social Determinants of Health   Financial Resource Strain: Not on file  Food Insecurity: Not on file  Transportation Needs: Not on file  Physical Activity: Not on file  Stress: Not on file  Social Connections: Not on file  Intimate Partner Violence: Not on file   Family History  Family history unknown: Yes   BP 116/78   Pulse 88   Wt 56.7 kg (125 lb)   SpO2 98%   BMI 20.80 kg/m   Wt Readings from Last 3 Encounters:  12/13/20 56.7 kg (125 lb)  11/09/20 55.1 kg (121 lb 6.4 oz)  10/16/20 54 kg (119 lb)   PHYSICAL EXAM: General:  NAD. No resp difficulty, thin HEENT: Normal Neck: Supple. No JVD. Carotids 2+ bilat; no bruits. No lymphadenopathy or thryomegaly appreciated. Cor: PMI nondisplaced. Regular rate & rhythm. No rubs, gallops or murmurs. Lungs: Clear Abdomen: Soft, nontender, nondistended. No hepatosplenomegaly. No bruits or masses. Good bowel sounds. Extremities: No cyanosis, clubbing, rash, edema Neuro: Alert & oriented x 3, cranial nerves grossly intact. Moves all 4 extremities w/o difficulty. Affect pleasant.  ECG: sinus rhythm 79 bpm (personally reviewed).  ASSESSMENT & PLAN:  1. CAD: s/p anterior STEMI with  occluded proximal LAD.  Treated with DES but complicated by embolization to distal LAD (procedure completed with occluded distal LAD).  No chest pain. - Continue ASA + Plavix. - Continue Crestor 40 mg daily. LDL (7/22) 40 2. Chronic systolic heart failure: Ischemic cardiomyopathy. Echo (6/22) EF 30-35%, no LV thrombus, LAD territory WMAs, RV normal, IVC normal. He is not volume overloaded today, stable NYHA I-II symptoms. - Start Coreg 3.125 mg bid.  - Continue valsartan 40 mg daily. Consider switching to [facility] next. - Continue digoxin 0.125 mg daily. - Plan to add Farxiga and spiro gradually. - He declined CR referral today. - Plan to repeat echo in 3 months. 3.  History of cardiac arrest: VF arrest associated with STEMI. Patient shocked x 5, 15 min CPR before ROSC.  No further VT.   4. Paroxysmal Atrial fibrillation: No actual atrial fibrillation, has had ST with PACs. He is SR on ECG today. Denies palpitations. I did not see where Zio patch had been resulted. - Coreg as above. 5. CVA: MRI 6/7 with punctate acute to subacute frontal infarct. No obvious deficits.  - Continue DAPT  Follow up in 3 weeks with PharmD (consider switching valsartan to Entresto or adding [place]) and 6 weeks with APP. May need paramedicine referral.   Prince Rome, FNP-BC 12/13/20

## 2020-12-13 ENCOUNTER — Encounter (HOSPITAL_COMMUNITY): Payer: Self-pay

## 2020-12-13 ENCOUNTER — Other Ambulatory Visit: Payer: Self-pay

## 2020-12-13 ENCOUNTER — Ambulatory Visit (HOSPITAL_COMMUNITY)
Admission: RE | Admit: 2020-12-13 | Discharge: 2020-12-13 | Disposition: A | Discharge status: Home / Self Care | Payer: Medicare Other | Source: Ambulatory Visit | Attending: Family Medicine | Admitting: Family Medicine

## 2020-12-13 VITALS — BP 116/78 | HR 88 | Wt 125.0 lb

## 2020-12-13 DIAGNOSIS — Z8673 Personal history of transient ischemic attack (TIA), and cerebral infarction without residual deficits: Secondary | ICD-10-CM | POA: Diagnosis not present

## 2020-12-13 DIAGNOSIS — Z79899 Other long term (current) drug therapy: Secondary | ICD-10-CM | POA: Diagnosis not present

## 2020-12-13 DIAGNOSIS — Z8674 Personal history of sudden cardiac arrest: Secondary | ICD-10-CM | POA: Diagnosis not present

## 2020-12-13 DIAGNOSIS — I480 Paroxysmal atrial fibrillation: Secondary | ICD-10-CM | POA: Insufficient documentation

## 2020-12-13 DIAGNOSIS — Z09 Encounter for follow-up examination after completed treatment for conditions other than malignant neoplasm: Secondary | ICD-10-CM | POA: Diagnosis not present

## 2020-12-13 DIAGNOSIS — I2510 Atherosclerotic heart disease of native coronary artery without angina pectoris: Secondary | ICD-10-CM | POA: Diagnosis not present

## 2020-12-13 DIAGNOSIS — Z87891 Personal history of nicotine dependence: Secondary | ICD-10-CM | POA: Diagnosis not present

## 2020-12-13 DIAGNOSIS — I639 Cerebral infarction, unspecified: Secondary | ICD-10-CM

## 2020-12-13 DIAGNOSIS — I5022 Chronic systolic (congestive) heart failure: Secondary | ICD-10-CM | POA: Diagnosis present

## 2020-12-13 DIAGNOSIS — I252 Old myocardial infarction: Secondary | ICD-10-CM | POA: Insufficient documentation

## 2020-12-13 DIAGNOSIS — Z7902 Long term (current) use of antithrombotics/antiplatelets: Secondary | ICD-10-CM | POA: Insufficient documentation

## 2020-12-13 DIAGNOSIS — Z7982 Long term (current) use of aspirin: Secondary | ICD-10-CM | POA: Diagnosis not present

## 2020-12-13 DIAGNOSIS — I255 Ischemic cardiomyopathy: Secondary | ICD-10-CM | POA: Diagnosis not present

## 2020-12-13 LAB — BASIC METABOLIC PANEL
Anion gap: 6 (ref 5–15)
BUN: 8 mg/dL (ref 8–23)
CO2: 25 mmol/L (ref 22–32)
Calcium: 8.9 mg/dL (ref 8.9–10.3)
Chloride: 106 mmol/L (ref 98–111)
Creatinine, Ser: 0.72 mg/dL (ref 0.61–1.24)
GFR, Estimated: >60 mL/min (ref >60)
Glucose, Bld: 96 mg/dL (ref 70–99)
Potassium: 3.8 mmol/L (ref 3.5–5.1)
Sodium: 137 mmol/L (ref 135–145)

## 2020-12-13 LAB — DIGOXIN LEVEL: Digoxin Level: <0.2 ng/mL — ABNORMAL LOW (ref 0.8–2.0)

## 2020-12-13 MED ORDER — CARVEDILOL 3.125 MG PO TABS: 3.1250 mg | ORAL_TABLET | Freq: Two times a day (BID) | ORAL | 11 refills | Status: DC

## 2020-12-13 NOTE — Patient Instructions (Addendum)
EKG done today.  Labs done today. We will contact you only if your labs are abnormal.  START Carvedilol 3.125mg  (1 tablet) by mouth 2 times daily.   No other medication changes were made. Please continue all current medications as prescribed.  Your physician recommends that you schedule a follow-up appointment in: 3 weeks with our Clinic Pharmacist and in 6 weeks with our PA/NP Clinic.   If you have any questions or concerns before your next appointment please send [place] a message through [place] or call our office at [phone].    TO LEAVE A MESSAGE FOR THE NURSE SELECT OPTION 2, PLEASE LEAVE A MESSAGE INCLUDING: YOUR NAME DATE OF BIRTH CALL BACK NUMBER REASON FOR CALL**this is important as we prioritize the call backs  YOU WILL RECEIVE A CALL BACK THE SAME DAY AS LONG AS YOU CALL BEFORE 4:00 PM   Do the following things EVERYDAY: Weigh yourself in the morning before breakfast. Write it down and keep it in a log. Take your medicines as prescribed Eat low salt foods--Limit salt (sodium) to 2000 mg per day.  Stay as active as you can everyday Limit all fluids for the day to less than 2 liters   At the [facility], you and your health needs are our priority. As part of our continuing mission to provide you with exceptional heart care, we have created designated Provider Care Teams. These Care Teams include your primary Cardiologist (physician) and Advanced Practice Providers (APPs- Physician Assistants and Nurse Practitioners) who all work together to provide you with the care you need, when you need it.   You may see any of the following providers on your designated Care Team at your next follow up: Dr Arvilla Meres Dr Carron Curie, NP Robbie Lis, [place] Karle Plumber, PharmD   Please be sure to bring in all your medications bottles to every appointment.

## 2020-12-20 ENCOUNTER — Ambulatory Visit (INDEPENDENT_AMBULATORY_CARE_PROVIDER_SITE_OTHER): Payer: Medicare Other | Admitting: Neurology

## 2020-12-20 ENCOUNTER — Encounter: Payer: Self-pay | Admitting: Neurology

## 2020-12-20 VITALS — BP 119/76 | HR 72 | Ht 65.0 in | Wt 126.6 lb

## 2020-12-20 DIAGNOSIS — G3184 Mild cognitive impairment, so stated: Secondary | ICD-10-CM | POA: Diagnosis not present

## 2020-12-20 DIAGNOSIS — I255 Ischemic cardiomyopathy: Secondary | ICD-10-CM | POA: Diagnosis not present

## 2020-12-20 DIAGNOSIS — Z8674 Personal history of sudden cardiac arrest: Secondary | ICD-10-CM | POA: Diagnosis not present

## 2020-12-20 NOTE — Patient Instructions (Signed)
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  Reviewed results of lab work and transcranial Doppler studies and recommend getting an EEG done which for unclear reason has not been done.  He was advised to increase participation in mentally challenging activities like solving crossword puzzles, playing sudoku and playing games.  We also discussed memory compensation strategies.  Return for follow-up in the future in 6 months or call earlier if necessary. Memory Compensation Strategies  Use "WARM" strategy.  W= write it down  A= associate it  R= repeat it  M= make a mental note  2.   You can keep a Glass blower/designer.  Use a 3-ring notebook with sections for the following: calendar, important names and phone numbers,  medications, doctors' names/phone numbers, lists/reminders, and a section to journal what you did  each day.   3.    Use a calendar to write appointments down.  4.    Write yourself a schedule for the day.  This can be placed on the calendar or in a separate section of the Memory Notebook.  Keeping a  regular schedule can help memory.  5.    Use medication organizer with sections for each day or morning/evening pills.  You may need help loading it  6.    Keep a basket, or pegboard by the door.  Place items that you need to take out with you in the basket or on the pegboard.  You may also want to  include a message board for reminders.  7.    Use sticky notes.  Place sticky notes with reminders in a place where the task is performed.  For example: " turn off the  stove" placed by the stove, "lock the door" placed on the door at eye level, " take your medications" on  the bathroom mirror or by the place where you normally take your medications.  8.    Use alarms/timers.  Use while cooking to remind yourself to check on food or as a reminder to take your medicine, or as a   reminder to make a call, or as a reminder to perform another task, etc.

## 2020-12-20 NOTE — Progress Notes (Signed)
[facility] [address] [place]. [place] [address] [phone]       OFFICE FOLLOW UP VISIT NOTE  Vincent Horton Date of Birth:  1955/07/15 Medical Record Number:  [id number]   Referring MD:  Deatra Ina, PA-c  Reason for Referral:  Stroke  HPI: Initial visit 09/13/2020 :Vincent Horton is a 65 year old [place] male seen today for initial office consultation visit for stroke and memory loss.  History is obtained from the patient and his son who is accompanying him who speaks fluent [place] and translates for him.  I have also reviewed electronic medical records and pertinent imaging films in PACS.  He presented on 07/21/2020 after being found unresponsive by family.  EMS on arrival noted him to be pulseless and apneic.  Initial rhythm identified was V. fib and he received a total of 5 shocks and 3 epi injections as well as 450 mg of amiodarone over 15 minutes of CPR before return of spontaneous circulation.  Patient was subsequently found to be agitated and hypertensive upon arrival to the ED and required sedation with Versed.  Admission chemistry was significant for troponin of 2747 and lactic acid of 3.7.  EKG showed ST segment changes in anterior lateral leads and he underwent emergent cardiac catheterization and required left anterior descending coronary artery stenting.  Echocardiogram showed ejection fraction of 30 to 35% with moderately decreased LV function.  Cranial CT scan showed no acute abnormality or cerebral edema.  There were changes of chronic small vessel disease.  He was kept in the ICU and required vasopressors for hypotension and respiratory failure needing ventilatory support.  He developed aspiration pneumonia and right pleural effusion requiring thoracocentesis with removal of 450 cc of fluid.  EEG done on 3 consecutive days showed moderate encephalopathy with 4 to 6 Hz mixed theta and delta slowing but no epileptiform activity was noted.  He was started on aspirin  81 and Brilinta for his coronary stent.  Patient developed altered mental status and requiring MRI scan done on 08/07/2020 which showed tiny punctate left frontal white matter lacunar infarct but no evidence of hypoxic ischemic brain injury.  Patient subsequently was transferred to inpatient rehab and has now been discharged home.  According to the son is doing better his memory seems to have improved though he still has some short-term memory difficulties but these are not getting worse.  He has had no focal deficits from stroke.  He has quit smoking completely.  He is tolerating aspirin and Brilinta with only minor bruising and no bleeding.  His blood pressure is well controlled and today it is 110/76.  He is tolerating Crestor well without muscle aches and pains. Update 12/20/2020: He returns for follow-up after last visit 3 months ago.  Accompanied by son who translates for him.  Patient is doing well.  He is continues to have mild short-term memory difficulties with unchanged.  His memory difficulties are not getting any worse.  He is on aspirin and Plavix and tolerating them without any side effects.  His blood pressure is well controlled and today it is 119/76.  He has no new complaints. ROS:   14 system review of systems is positive for cardiac arrest, loss of consciousness, confusion, memory loss and all other systems negative  PMH:  Past Medical History:  Diagnosis Date   Cardiac arrest [facility])    Coronary artery disease    Dysrhythmia    Myocardial infarction [facility])    Stroke (HCC)  Social History:  Social History   Socioeconomic History   Marital status: Widowed    Spouse name: Not on file   Number of children: Not on file   Years of education: Not on file   Highest education level: Not on file  Occupational History   Not on file  Tobacco Use   Smoking status: Former    Types: Cigarettes   Smokeless tobacco: Never  Vaping Use   Vaping Use: Unknown  Substance and Sexual  Activity   Alcohol use: Not Currently   Drug use: Not Currently   Sexual activity: Not on file  Other Topics Concern   Not on file  Social History Narrative   Lives with his 4 daughters   Right Handed   Drinks 3-4 cups caffeine   Social Determinants of Health   Financial Resource Strain: Not on file  Food Insecurity: Not on file  Transportation Needs: Not on file  Physical Activity: Not on file  Stress: Not on file  Social Connections: Not on file  Intimate Partner Violence: Not on file    Medications:   Current Outpatient Medications on File Prior to Visit  Medication Sig Dispense Refill   acetaminophen (TYLENOL) 160 MG/5ML solution Take 20.3 mLs (650 mg total) by mouth every 6 (six) hours as needed for fever. 120 mL 0   aspirin 81 MG chewable tablet Chew 1 tablet (81 mg total) by mouth daily.     carvedilol (COREG) 3.125 MG tablet Take 1 tablet (3.125 mg total) by mouth 2 (two) times daily. 60 tablet 11   clopidogrel (PLAVIX) 75 MG tablet Take 1 tablet (75 mg total) by mouth daily. 90 tablet 3   digoxin (LANOXIN) 0.125 MG tablet Take 1 tablet (125 mcg total) by mouth daily. 90 tablet 0   rosuvastatin (CRESTOR) 40 MG tablet Take 1 tablet (40 mg total) by mouth daily. 90 tablet 0   valsartan (DIOVAN) 40 MG tablet Take 1 tablet (40 mg total) by mouth daily. 90 tablet 3   No current facility-administered medications on file prior to visit.    Allergies:   Allergies  Allergen Reactions   Beef-Derived Products Other (See Comments)    Patient is Muslim - cannot get ANY beef, pork, or poultry products (food and medications included)   Pork-Derived Products Other (See Comments)    Patient is Muslim - cannot get ANY beef, pork, or poultry products (food and medications included)   Poultry Meal Other (See Comments)    Patient is Muslim - cannot get ANY beef, pork, or poultry products (food and medications included)    Physical Exam General: Frail malnourished looking  middle-aged African male, seated, in no evident distress Head: head normocephalic and atraumatic.   Neck: supple with no carotid or supraclavicular bruits Cardiovascular: regular rate and rhythm, no murmurs Musculoskeletal: no deformity Skin:  no rash/petichiae Vascular:  Normal pulses all extremities  Neurologic Exam Mental Status: Awake and fully alert. Oriented to place and time. Recent and remote memory intact. Attention span, concentration and fund of knowledge appropriate. Mood and affect appropriate.  Diminished recall 0/3.  Able to name only 4 animals that can walk on 4 legs.  Clock drawing 3/4.  Mini-Mental status exam not done due to language barrier Cranial Nerves: Fundoscopic exam reveals sharp disc margins. Pupils equal, briskly reactive to light. Extraocular movements full without nystagmus. Visual fields full to confrontation. Hearing intact. Facial sensation intact. Face, tongue, palate moves normally and symmetrically.  Motor: Normal bulk  and tone. Normal strength in all tested extremity muscles. Sensory.: intact to touch , pinprick , position and vibratory sensation.  Coordination: Rapid alternating movements normal in all extremities. Finger-to-nose and heel-to-shin performed accurately bilaterally. Gait and Station: Arises from chair without difficulty. Stance is normal. Gait demonstrates normal stride length and balance . Able to heel, toe and tandem walk with moderate t difficulty.  Reflexes: 1+ and symmetric. Toes downgoing.   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  Vascular risk factors of coronary artery disease, smoking, hyperlipidemia.     PLAN:I had a long discussion with the patient and his son regarding his memory loss and mild cognitive impairment status postcardiac arrest  and answered questions.  Reviewed results of lab work and transcranial Doppler studies and recommend getting an EEG done which for unclear reason has not been done.  He was advised to increase participation in mentally challenging activities like solving crossword puzzles, playing sudoku and playing games.  We also discussed memory compensation strategies.  Return for follow-up in the future in 6 months or call earlier if necessary.Greater than 50% time during this 25-minute  visit was spent on counseling and coordination of care about his memory loss following cardiac arrest and silent lacunar stroke and answering questions. Marlena Barbato,MD  Note: This document was prepared with digital dictation and possible smart phrase technology. Any transcriptional errors that result from this process are unintentional.

## 2021-01-03 ENCOUNTER — Inpatient Hospital Stay (HOSPITAL_COMMUNITY): Admission: RE | Admit: 2021-01-03 | Payer: Medicare Other | Source: Ambulatory Visit

## 2021-01-16 ENCOUNTER — Other Ambulatory Visit: Payer: Self-pay

## 2021-01-16 ENCOUNTER — Ambulatory Visit (INDEPENDENT_AMBULATORY_CARE_PROVIDER_SITE_OTHER): Payer: Medicare Other | Admitting: Family Medicine

## 2021-01-16 VITALS — BP 113/72 | HR 85 | Temp 98.2°F | Resp 16 | Wt 130.8 lb

## 2021-01-16 DIAGNOSIS — R197 Diarrhea, unspecified: Secondary | ICD-10-CM

## 2021-01-16 DIAGNOSIS — Z789 Other specified health status: Secondary | ICD-10-CM

## 2021-01-16 NOTE — Progress Notes (Signed)
Patient said that one of his medications is given him diarrhea. Patient not sure which one it is.

## 2021-01-17 ENCOUNTER — Encounter: Payer: Self-pay | Admitting: Family Medicine

## 2021-01-17 LAB — BASIC METABOLIC PANEL
BUN/Creatinine Ratio: 12 (ref 10–24)
BUN: 9 mg/dL (ref 8–27)
CO2: 19 mmol/L — ABNORMAL LOW (ref 20–29)
Calcium: 8.7 mg/dL (ref 8.6–10.2)
Chloride: 106 mmol/L (ref 96–106)
Creatinine, Ser: 0.73 mg/dL — ABNORMAL LOW (ref 0.76–1.27)
Glucose: 123 mg/dL — ABNORMAL HIGH (ref 70–99)
Potassium: 4.7 mmol/L (ref 3.5–5.2)
Sodium: 141 mmol/L (ref 134–144)
eGFR: 100 mL/min/{1.73_m2} (ref >59)

## 2021-01-17 LAB — DIGOXIN LEVEL: Digoxin, Serum: <0.4 ng/mL — ABNORMAL LOW (ref 0.5–0.9)

## 2021-01-17 NOTE — Progress Notes (Signed)
Established Patient Office Visit  Subjective:  Patient ID: Vincent Horton, male    DOB: 17-Sep-1955  Age: 65 y.o. MRN: [id number]  CC:  Chief Complaint  Patient presents with   Follow-up    HPI Vincent Horton presents for complaint of persistent diarrhea. Patient reports loose stools 3-4 times daily. Patient believes that it is one of his medicines. This visit was aided by an interpreter.   Past Medical History:  Diagnosis Date   Cardiac arrest [facility])    Coronary artery disease    Dysrhythmia    Myocardial infarction [facility])    Stroke [facility])     Past Surgical History:  Procedure Laterality Date   CARDIAC CATHETERIZATION     CORONARY/GRAFT ACUTE MI REVASCULARIZATION N/A 07/21/2020   Procedure: Coronary/Graft Acute MI Revascularization;  Surgeon: Swaziland, Peter M, MD;  Location: [facility] INVASIVE CV LAB;  Service: Cardiovascular;  Laterality: N/A;   LEFT HEART CATH AND CORONARY ANGIOGRAPHY N/A 07/21/2020   Procedure: LEFT HEART CATH AND CORONARY ANGIOGRAPHY;  Surgeon: Swaziland, Peter M, MD;  Location: [facility] INVASIVE CV LAB;  Service: Cardiovascular;  Laterality: N/A;   RIGHT HEART CATH N/A 07/21/2020   Procedure: RIGHT HEART CATH;  Surgeon: Swaziland, Peter M, MD;  Location: [facility] INVASIVE CV LAB;  Service: Cardiovascular;  Laterality: N/A;    Family History  Family history unknown: Yes    Social History   Socioeconomic History   Marital status: Widowed    Spouse name: Not on file   Number of children: Not on file   Years of education: Not on file   Highest education level: Not on file  Occupational History   Not on file  Tobacco Use   Smoking status: Former    Types: Cigarettes   Smokeless tobacco: Never  Vaping Use   Vaping Use: Unknown  Substance and Sexual Activity   Alcohol use: Not Currently   Drug use: Not Currently   Sexual activity: Not on file  Other Topics Concern   Not on file  Social History Narrative   Lives with his 4 daughters   Right Handed   Drinks 3-4 cups caffeine    Social Determinants of Health   Financial Resource Strain: Not on file  Food Insecurity: Not on file  Transportation Needs: Not on file  Physical Activity: Not on file  Stress: Not on file  Social Connections: Not on file  Intimate Partner Violence: Not on file    ROS Review of Systems  Constitutional:  Negative for fever.  Gastrointestinal:  Positive for diarrhea. Negative for abdominal pain, nausea and vomiting.  All other systems reviewed and are negative.  Objective:   Today's Vitals: BP 113/72   Pulse 85   Temp 98.2 F (36.8 C) (Oral)   Resp 16   Wt 130 lb 12.8 oz (59.3 kg)   SpO2 97%   BMI 21.77 kg/m   Physical Exam Vitals and nursing note reviewed.  Constitutional:      General: He is not in acute distress. Cardiovascular:     Rate and Rhythm: Normal rate and regular rhythm.  Pulmonary:     Effort: Pulmonary effort is normal.     Breath sounds: Normal breath sounds.  Abdominal:     Palpations: Abdomen is soft.     Tenderness: There is no abdominal tenderness.  Neurological:     General: No focal deficit present.     Mental Status: He is alert and oriented to person, place, and time.  Assessment & Plan:   1. Diarrhea, unspecified type ? If dig is the cause. Will obtain monitoring labs. Results pending. Recommended patient keep with adequate fluids.  - Digoxin level - Basic Metabolic Panel  2. Language barrier to communication     Outpatient Encounter Medications as of 01/16/2021  Medication Sig   acetaminophen (TYLENOL) 160 MG/5ML solution Take 20.3 mLs (650 mg total) by mouth every 6 (six) hours as needed for fever.   aspirin 81 MG chewable tablet Chew 1 tablet (81 mg total) by mouth daily.   carvedilol (COREG) 3.125 MG tablet Take 1 tablet (3.125 mg total) by mouth 2 (two) times daily.   clopidogrel (PLAVIX) 75 MG tablet Take 1 tablet (75 mg total) by mouth daily.   digoxin (LANOXIN) 0.125 MG tablet Take 1 tablet (125 mcg total) by mouth  daily.   rosuvastatin (CRESTOR) 40 MG tablet Take 1 tablet (40 mg total) by mouth daily.   valsartan (DIOVAN) 40 MG tablet Take 1 tablet (40 mg total) by mouth daily.   No facility-administered encounter medications on file as of 01/16/2021.    Follow-up: No follow-ups on file.   Becky Sax, MD

## 2021-01-18 ENCOUNTER — Telehealth: Payer: Self-pay | Admitting: Family Medicine

## 2021-01-18 ENCOUNTER — Other Ambulatory Visit: Payer: Self-pay | Admitting: Family Medicine

## 2021-01-18 MED ORDER — DIGOXIN 125 MCG PO TABS: 125.0000 ug | ORAL_TABLET | Freq: Every day | ORAL | 0 refills | Status: DC

## 2021-01-18 NOTE — Telephone Encounter (Signed)
digoxin (LANOXIN) 0.125 MG tablet [[id number]]   Pharmacy  [facility] [phone] [place], [place] - [phone] AT [facility]  [address] [place], [place] [address]  Phone:  [phone]  Fax:  [phone]

## 2021-01-18 NOTE — Progress Notes (Signed)
It seems you sent this message to me by mishap. Please ensure questions are answered from appropriate provider so that we can update patient accordingly.

## 2021-01-22 NOTE — Progress Notes (Incomplete)
ADVANCED HF CLINIC CONSULT NOTE  Primary Care: Dr. Linna Darner Primary Cardiologist: Dr. Acie Fredrickson HF Cardiologist: Dr. Aundra Dubin  HPI: Vincent Horton is a 64 y.o.male with a hx of CAD, VF arrest, and systolic CHF/iCM.  He was admitted 07/21/20 with Vfib arrest.  He had an emergent heart catheterization which revealed an occluded left anterior descending artery.  This was stented by Dr. Martinique and he was started on ASA + Brilinta. Echocardiogram showed EF of 30 to 35% with moderately decreased LV function. He was kept in the ICU and required vasopressors for hypotension and respiratory failure needing ventilatory support.  He developed aspiration pneumonia and right pleural effusion requiring thoracocentesis with removal of 450 cc of fluid. He developed altered mental status and MRI scan showed punctate acute to subacute frontal infarct, but no evidence of hypoxic ischemic brain injury. There was no definite atrial fibrillation inpatient on telemetry.  He was transferred to [facility] for rehab with Zio 14 day monitor.     Follow up with general cardiology 9/22, he was off his Brilinta for unclear reasons. Plavix and valsartan started this visit.  Today he returns for post hospitalization HF follow up with his daughter. He walks daily for 30-45 minutes with no dyspnea. Overall feeling fine. Denies CP, dizziness, edema, or PND/Orthopnea. Appetite ok. No fever or chills. Weight at home 120-124 pounds. Taking all medications. He finished PT/OT at home this summer. He quit smoking 4 months ago. He does not want to take anymore medications.   Review of Systems: [y] = yes, [ ]  = no   General: Weight gain [ ] ; Weight loss [ ] ; Anorexia [ ] ; Fatigue [ ] ; Fever [ ] ; Chills [ ] ; Weakness [ ]   Cardiac: Chest pain/pressure [ ] ; Resting SOB [ ] ; Exertional SOB [ ] ; Orthopnea [ ] ; Pedal Edema [ ] ; Palpitations [ ] ; Syncope [ ] ; Presyncope [ ] ; Paroxysmal nocturnal dyspnea[ ]   Pulmonary: Cough [ ] ; Wheezing[ ] ; Hemoptysis[  ]; Sputum [ ] ; Snoring [ ]   GI: Vomiting[ ] ; Dysphagia[ ] ; Melena[ ] ; Hematochezia [ ] ; Heartburn[ ] ; Abdominal pain [ ] ; Constipation [ ] ; Diarrhea [ ] ; BRBPR [ ]   GU: Hematuria[ ] ; Dysuria [ ] ; Nocturia[ ]   Vascular: Pain in legs with walking [ ] ; Pain in feet with lying flat [ ] ; Non-healing sores [ ] ; Stroke [ ] ; TIA [ ] ; Slurred speech [ ] ;  Neuro: Headaches[ ] ; Vertigo[ ] ; Seizures[ ] ; Paresthesias[ ] ;Blurred vision [ ] ; Diplopia [ ] ; Vision changes [ ]   Ortho/Skin: Arthritis [ ] ; Joint pain [ ] ; Muscle pain [ ] ; Joint swelling [ ] ; Back Pain [ ] ; Rash [ ]   Psych: Depression[ ] ; Anxiety[ ]   Heme: Bleeding problems [ ] ; Clotting disorders [ ] ; Anemia [ ]   Endocrine: Diabetes Blue.Reese ]; Thyroid dysfunction[ ]   Past Medical History:  Diagnosis Date   Cardiac arrest [facility])    Coronary artery disease    Dysrhythmia    Myocardial infarction [facility])    Stroke [facility])     Current Outpatient Medications  Medication Sig Dispense Refill   acetaminophen (TYLENOL) 160 MG/5ML solution Take 20.3 mLs (650 mg total) by mouth every 6 (six) hours as needed for fever. 120 mL 0   aspirin 81 MG chewable tablet Chew 1 tablet (81 mg total) by mouth daily.     carvedilol (COREG) 3.125 MG tablet Take 1 tablet (3.125 mg total) by mouth 2 (two) times daily. 60 tablet 11   clopidogrel (PLAVIX) 75 MG  tablet Take 1 tablet (75 mg total) by mouth daily. 90 tablet 3   digoxin (LANOXIN) 0.125 MG tablet Take 1 tablet (125 mcg total) by mouth daily. 90 tablet 0   rosuvastatin (CRESTOR) 40 MG tablet Take 1 tablet (40 mg total) by mouth daily. 90 tablet 0   valsartan (DIOVAN) 40 MG tablet Take 1 tablet (40 mg total) by mouth daily. 90 tablet 3   No current facility-administered medications for this visit.   Allergies  Allergen Reactions   Beef-Derived Products Other (See Comments)    Patient is Muslim - cannot get ANY beef, pork, or poultry products (food and medications included)   Pork-Derived Products Other (See  Comments)    Patient is Muslim - cannot get ANY beef, pork, or poultry products (food and medications included)   Poultry Meal Other (See Comments)    Patient is Muslim - cannot get ANY beef, pork, or poultry products (food and medications included)   Social History   Socioeconomic History   Marital status: Widowed    Spouse name: Not on file   Number of children: Not on file   Years of education: Not on file   Highest education level: Not on file  Occupational History   Not on file  Tobacco Use   Smoking status: Former    Types: Cigarettes   Smokeless tobacco: Never  Vaping Use   Vaping Use: Unknown  Substance and Sexual Activity   Alcohol use: Not Currently   Drug use: Not Currently   Sexual activity: Not on file  Other Topics Concern   Not on file  Social History Narrative   Lives with his 4 daughters   Right Handed   Drinks 3-4 cups caffeine   Social Determinants of Health   Financial Resource Strain: Not on file  Food Insecurity: Not on file  Transportation Needs: Not on file  Physical Activity: Not on file  Stress: Not on file  Social Connections: Not on file  Intimate Partner Violence: Not on file   Family History  Family history unknown: Yes   There were no vitals taken for this visit.  Wt Readings from Last 3 Encounters:  01/16/21 59.3 kg (130 lb 12.8 oz)  12/20/20 57.4 kg (126 lb 9.6 oz)  12/13/20 56.7 kg (125 lb)   PHYSICAL EXAM: General:  NAD. No resp difficulty, thin HEENT: Normal Neck: Supple. No JVD. Carotids 2+ bilat; no bruits. No lymphadenopathy or thryomegaly appreciated. Cor: PMI nondisplaced. Regular rate & rhythm. No rubs, gallops or murmurs. Lungs: Clear Abdomen: Soft, nontender, nondistended. No hepatosplenomegaly. No bruits or masses. Good bowel sounds. Extremities: No cyanosis, clubbing, rash, edema Neuro: Alert & oriented x 3, cranial nerves grossly intact. Moves all 4 extremities w/o difficulty. Affect pleasant.  ECG: sinus  rhythm 79 bpm (personally reviewed).  ASSESSMENT & PLAN:  1. CAD: s/p anterior STEMI with occluded proximal LAD.  Treated with DES but complicated by embolization to distal LAD (procedure completed with occluded distal LAD).  No chest pain. - Continue ASA + Plavix. - Continue Crestor 40 mg daily. LDL (7/22) 40 2. Chronic systolic heart failure: Ischemic cardiomyopathy. Echo (6/22) EF 30-35%, no LV thrombus, LAD territory WMAs, RV normal, IVC normal. He is not volume overloaded today, stable NYHA I-II symptoms. - Start Coreg 3.125 mg bid.  - Continue valsartan 40 mg daily. Consider switching to [facility] next. - Continue digoxin 0.125 mg daily. - Plan to add Farxiga and spiro gradually. - He declined CR referral today. -  Plan to repeat echo in 3 months. 3. History of cardiac arrest: VF arrest associated with STEMI. Patient shocked x 5, 15 min CPR before ROSC.  No further VT.   4. Paroxysmal Atrial fibrillation: No actual atrial fibrillation, has had ST with PACs. He is SR on ECG today. Denies palpitations. I did not see where Zio patch had been resulted. - Coreg as above. 5. CVA: MRI 6/7 with punctate acute to subacute frontal infarct. No obvious deficits.  - Continue DAPT  Follow up in 3 weeks with PharmD (consider switching valsartan to Entresto or adding [place]) and 6 weeks with APP. May need paramedicine referral.   Prince Rome, FNP-BC 01/22/21

## 2021-01-29 ENCOUNTER — Encounter (HOSPITAL_COMMUNITY): Payer: Medicare Other

## 2021-02-22 ENCOUNTER — Other Ambulatory Visit: Payer: Self-pay | Admitting: *Deleted

## 2021-02-22 DIAGNOSIS — I252 Old myocardial infarction: Secondary | ICD-10-CM

## 2021-02-22 MED ORDER — ROSUVASTATIN CALCIUM 40 MG PO TABS: 40.0000 mg | ORAL_TABLET | Freq: Every day | ORAL | 0 refills | Status: DC

## 2021-03-05 ENCOUNTER — Ambulatory Visit
Admission: EM | Admit: 2021-03-05 | Discharge: 2021-03-05 | Disposition: A | Discharge status: Home / Self Care | Payer: Medicaid Other | Attending: Physician Assistant | Admitting: Physician Assistant

## 2021-03-05 ENCOUNTER — Other Ambulatory Visit: Payer: Self-pay

## 2021-03-05 DIAGNOSIS — R0789 Other chest pain: Secondary | ICD-10-CM

## 2021-03-05 DIAGNOSIS — J069 Acute upper respiratory infection, unspecified: Secondary | ICD-10-CM | POA: Diagnosis not present

## 2021-03-05 NOTE — ED Provider Notes (Signed)
[name]    CSN: [id number] Arrival date & time: 03/05/21  1401      History   Chief Complaint Chief Complaint  Patient presents with   Cough    HPI Vincent Horton is a 66 y.o. male.   Patient here today with daughter who helps with translation. Patient reports that he has had sinus congestion, chest congestion and cough for about one week. He has had some low grade fever. Cough is not productive. He denies any shortness of breath. He has not taken any medication for symptoms  He also reports pain in his bilateral chest area when he lifts heavy items (water, etc) at times. He denies any associated shortness of breath or lightheadedness.  The history is provided by the patient.   Past Medical History:  Diagnosis Date   Cardiac arrest [facility])    Coronary artery disease    Dysrhythmia    Myocardial infarction [facility])    Stroke [facility])     Patient Active Problem List   Diagnosis Date Noted   History of ST elevation myocardial infarction (STEMI) 09/26/2020   History of pleural effusion 09/26/2020   Mixed hyperlipidemia 09/26/2020   CVA (cerebral vascular accident) (HCC) 08/10/2020   Pleural effusion    Protein-calorie malnutrition, severe 07/25/2020   Acute hypoxemic respiratory failure (HCC) 07/24/2020   Ischemic cardiomyopathy 07/24/2020   Aspiration pneumonia (HCC) 07/24/2020   Pulmonary edema cardiac cause (HCC) 07/24/2020   Acute kidney injury (HCC) 07/24/2020   Shock liver 07/24/2020   Cardiogenic shock (HCC) 07/24/2020   Transaminitis 07/24/2020   Protein calorie malnutrition (HCC) 07/23/2020   Acute ST elevation myocardial infarction (STEMI) due to occlusion of left anterior descending (LAD) coronary artery (HCC) 07/21/2020   Cardiac arrest (HCC) 07/21/2020   STEMI involving left anterior descending coronary artery (HCC) 07/21/2020    Past Surgical History:  Procedure Laterality Date   CARDIAC CATHETERIZATION     CORONARY/GRAFT ACUTE MI  REVASCULARIZATION N/A 07/21/2020   Procedure: Coronary/Graft Acute MI Revascularization;  Surgeon: Swaziland, Peter M, MD;  Location: [facility] INVASIVE CV LAB;  Service: Cardiovascular;  Laterality: N/A;   LEFT HEART CATH AND CORONARY ANGIOGRAPHY N/A 07/21/2020   Procedure: LEFT HEART CATH AND CORONARY ANGIOGRAPHY;  Surgeon: Swaziland, Peter M, MD;  Location: [facility] INVASIVE CV LAB;  Service: Cardiovascular;  Laterality: N/A;   RIGHT HEART CATH N/A 07/21/2020   Procedure: RIGHT HEART CATH;  Surgeon: Swaziland, Peter M, MD;  Location: [facility] INVASIVE CV LAB;  Service: Cardiovascular;  Laterality: N/A;       Home Medications    Prior to Admission medications   Medication Sig Start Date End Date Taking? Authorizing Provider  aspirin 81 MG chewable tablet Chew 1 tablet (81 mg total) by mouth daily. 08/23/20  Yes Angiulli, Mcarthur Rossetti, PA-C  carvedilol (COREG) 3.125 MG tablet Take 1 tablet (3.125 mg total) by mouth 2 (two) times daily. 12/13/20 12/13/21 Yes Milford, Anderson Malta, FNP  clopidogrel (PLAVIX) 75 MG tablet Take 1 tablet (75 mg total) by mouth daily. 11/09/20  Yes Nahser, Deloris Ping, MD  digoxin (LANOXIN) 0.125 MG tablet Take 1 tablet (125 mcg total) by mouth daily. 01/18/21 03/05/21 Yes Georganna Skeans, MD  rosuvastatin (CRESTOR) 40 MG tablet Take 1 tablet (40 mg total) by mouth daily. 02/22/21  Yes Georganna Skeans, MD  valsartan (DIOVAN) 40 MG tablet Take 1 tablet (40 mg total) by mouth daily. 11/09/20  Yes Nahser, Deloris Ping, MD  acetaminophen (TYLENOL) 160 MG/5ML solution Take 20.3 mLs (  650 mg total) by mouth every 6 (six) hours as needed for fever. 08/23/20   Angiulli, Mcarthur Rossetti, PA-C    Family History Family History  Family history unknown: Yes    Social History Social History   Tobacco Use   Smoking status: Former    Packs/day: 1.00    Types: Cigarettes   Smokeless tobacco: Never  Vaping Use   Vaping Use: Unknown  Substance Use Topics   Alcohol use: Not Currently   Drug use: Not Currently     Allergies    Beef-derived products, Pork-derived products, and Poultry meal   Review of Systems Review of Systems  Constitutional:  Positive for chills. Negative for fever.  HENT:  Positive for congestion and sore throat. Negative for ear pain.   Eyes:  Negative for discharge and redness.  Respiratory:  Positive for cough. Negative for shortness of breath.   Cardiovascular:  Positive for chest pain.  Gastrointestinal:  Negative for abdominal pain, nausea and vomiting.  Neurological:  Negative for light-headedness.    Physical Exam Triage Vital Signs ED Triage Vitals  Enc Vitals Group     BP      Pulse      Resp      Temp      Temp src      SpO2      Weight      Height      Head Circumference      Peak Flow      Pain Score      Pain Loc      Pain Edu?      Excl. in GC?    No data found.  Updated Vital Signs BP 105/72 (BP Location: Left Arm)    Pulse 81    Temp 97.9 F (36.6 C) (Oral)    Resp 18    SpO2 97%      Physical Exam Vitals and nursing note reviewed.  Constitutional:      General: He is not in acute distress.    Appearance: He is well-developed. He is not ill-appearing.  HENT:     Head: Normocephalic and atraumatic.     Right Ear: Tympanic membrane normal.     Left Ear: Tympanic membrane normal.     Nose: Congestion present.     Mouth/Throat:     Mouth: Mucous membranes are moist.     Pharynx: Posterior oropharyngeal erythema present. No oropharyngeal exudate.     Tonsils: 0 on the right. 0 on the left.  Eyes:     Conjunctiva/sclera: Conjunctivae normal.  Cardiovascular:     Rate and Rhythm: Normal rate and regular rhythm.     Heart sounds: Normal heart sounds. No murmur heard. Pulmonary:     Effort: Pulmonary effort is normal. No respiratory distress.     Breath sounds: Normal breath sounds. No wheezing, rhonchi or rales.  Skin:    General: Skin is warm and dry.  Neurological:     Mental Status: He is alert.  Psychiatric:        Mood and Affect: Mood  normal.        Behavior: Behavior normal.     UC Treatments / Results  Labs (all labs ordered are listed, but only abnormal results are displayed) Labs Reviewed  COVID-19, FLU A+B NAA    EKG   Radiology No results found.  Procedures Procedures (including critical care time)  Medications Ordered in UC Medications - No data to display  Initial  Impression / Assessment and Plan / UC Course  I have reviewed the triage vital signs and the nursing notes.  Pertinent labs & imaging results that were available during my care of the patient were reviewed by me and considered in my medical decision making (see chart for details).   Discussed that I suspect upper respiratory symptoms are viral in nature. Will screen for covid and flu. Will await results for further recommendations  Suspect chest discomfort is more pectoral strain-- he denies any symptoms concerning for cardiac etiology. Recommended he continue to monitor and follow up with any further concerns.   Final Clinical Impressions(s) / UC Diagnoses   Final diagnoses:  Acute upper respiratory infection  Chest wall pain   Discharge Instructions   None    ED Prescriptions   None    PDMP not reviewed this encounter.   Tomi Bamberger, PA-C 03/05/21 1652

## 2021-03-05 NOTE — ED Triage Notes (Signed)
Pt presents with two complaints.  First is nasal congestion, non-productive cough, fever/chills x about one week.  No one else sick and neg home covid.  Denies difficulty breathing.   Pt also concerned with intermittent soreness to chest.  States is occurs for about a week after lifting heavy items.

## 2021-03-07 LAB — COVID-19, FLU A+B NAA
Influenza A, NAA: NOT DETECTED
Influenza B, NAA: NOT DETECTED
SARS-CoV-2, NAA: NOT DETECTED

## 2021-04-01 ENCOUNTER — Other Ambulatory Visit: Payer: Self-pay

## 2021-04-01 ENCOUNTER — Other Ambulatory Visit (HOSPITAL_COMMUNITY): Payer: Self-pay

## 2021-04-01 ENCOUNTER — Encounter: Payer: Self-pay | Admitting: Cardiovascular Disease

## 2021-04-01 ENCOUNTER — Encounter (HOSPITAL_COMMUNITY): Payer: Self-pay

## 2021-04-01 ENCOUNTER — Ambulatory Visit (HOSPITAL_COMMUNITY)
Admission: RE | Admit: 2021-04-01 | Discharge: 2021-04-01 | Disposition: A | Discharge status: Home / Self Care | Payer: Medicare Other | Source: Ambulatory Visit | Attending: Family Medicine | Admitting: Family Medicine

## 2021-04-01 VITALS — BP 124/80 | HR 75 | Wt 140.8 lb

## 2021-04-01 DIAGNOSIS — Z7902 Long term (current) use of antithrombotics/antiplatelets: Secondary | ICD-10-CM | POA: Diagnosis not present

## 2021-04-01 DIAGNOSIS — Z955 Presence of coronary angioplasty implant and graft: Secondary | ICD-10-CM | POA: Insufficient documentation

## 2021-04-01 DIAGNOSIS — I255 Ischemic cardiomyopathy: Secondary | ICD-10-CM | POA: Insufficient documentation

## 2021-04-01 DIAGNOSIS — I480 Paroxysmal atrial fibrillation: Secondary | ICD-10-CM | POA: Diagnosis not present

## 2021-04-01 DIAGNOSIS — Z8674 Personal history of sudden cardiac arrest: Secondary | ICD-10-CM | POA: Diagnosis not present

## 2021-04-01 DIAGNOSIS — I5022 Chronic systolic (congestive) heart failure: Secondary | ICD-10-CM | POA: Diagnosis present

## 2021-04-01 DIAGNOSIS — I252 Old myocardial infarction: Secondary | ICD-10-CM | POA: Insufficient documentation

## 2021-04-01 DIAGNOSIS — Z79899 Other long term (current) drug therapy: Secondary | ICD-10-CM | POA: Insufficient documentation

## 2021-04-01 DIAGNOSIS — Z7984 Long term (current) use of oral hypoglycemic drugs: Secondary | ICD-10-CM | POA: Insufficient documentation

## 2021-04-01 DIAGNOSIS — I2510 Atherosclerotic heart disease of native coronary artery without angina pectoris: Secondary | ICD-10-CM | POA: Insufficient documentation

## 2021-04-01 DIAGNOSIS — Z8673 Personal history of transient ischemic attack (TIA), and cerebral infarction without residual deficits: Secondary | ICD-10-CM | POA: Insufficient documentation

## 2021-04-01 DIAGNOSIS — Z7982 Long term (current) use of aspirin: Secondary | ICD-10-CM | POA: Insufficient documentation

## 2021-04-01 DIAGNOSIS — I639 Cerebral infarction, unspecified: Secondary | ICD-10-CM

## 2021-04-01 LAB — BASIC METABOLIC PANEL
Anion gap: 7 (ref 5–15)
BUN: 10 mg/dL (ref 8–23)
CO2: 23 mmol/L (ref 22–32)
Calcium: 8.8 mg/dL — ABNORMAL LOW (ref 8.9–10.3)
Chloride: 107 mmol/L (ref 98–111)
Creatinine, Ser: 0.81 mg/dL (ref 0.61–1.24)
GFR, Estimated: >60 mL/min (ref >60)
Glucose, Bld: 115 mg/dL — ABNORMAL HIGH (ref 70–99)
Potassium: 4.1 mmol/L (ref 3.5–5.1)
Sodium: 137 mmol/L (ref 135–145)

## 2021-04-01 LAB — DIGOXIN LEVEL: Digoxin Level: 0.3 ng/mL — ABNORMAL LOW (ref 0.8–2.0)

## 2021-04-01 MED ORDER — DAPAGLIFLOZIN PROPANEDIOL 10 MG PO TABS: 10.0000 mg | ORAL_TABLET | Freq: Every day | ORAL | 2 refills | Status: DC

## 2021-04-01 NOTE — Patient Instructions (Signed)
Medication Changes:  Start Farxiga 10mg  Daily  Lab Work:  Labs done today, your results will be available in MyChart, we will contact you for abnormal readings.   Testing/Procedures:  none  Referrals:  none  Special Instructions // Education:  none  Follow-Up in: 4 weeks at clinic then 3 months with Dr.  At the [facility], you and your health needs are our priority. We have a designated team specialized in the treatment of Heart Failure. This Care Team includes your primary Heart Failure Specialized Cardiologist (physician), Advanced Practice Providers (APPs- Physician Assistants and Nurse Practitioners), and Pharmacist who all work together to provide you with the care you need, when you need it.   You may see any of the following providers on your designated Care Team at your next follow up:  Dr Shirlee Latch Dr Arvilla Meres, NP Carron Curie, Robbie Lis [facility] [place], [place], PharmD   Please be sure to bring in all your medications bottles to every appointment.   Need to Contact Karle Plumber:  If you have any questions or concerns before your next appointment please send [place] a message through [place] or call our office at [phone].    TO LEAVE A MESSAGE FOR THE NURSE SELECT OPTION 2, PLEASE LEAVE A MESSAGE INCLUDING: YOUR NAME DATE OF BIRTH CALL BACK NUMBER REASON FOR CALL**this is important as we prioritize the call backs  YOU WILL RECEIVE A CALL BACK THE SAME DAY AS LONG AS YOU CALL BEFORE 4:00 PM

## 2021-04-01 NOTE — Progress Notes (Signed)
ADVANCED HF CLINIC NOTE  Primary Care: Dr. Linna Darner Primary Cardiologist: Dr. Acie Fredrickson HF Cardiologist: Dr. Aundra Dubin  HPI: Vincent Horton is a 66 y.o.male with a hx of CAD, VF arrest, and systolic CHF/iCM.  He was admitted 07/21/20 with Vfib arrest.  He had an emergent heart catheterization which revealed an occluded left anterior descending artery.  This was stented by Dr. Martinique and he was started on ASA + Brilinta. Echocardiogram showed EF of 30 to 35% with moderately decreased LV function. He was kept in the ICU and required vasopressors for hypotension and respiratory failure needing ventilatory support.  He developed aspiration pneumonia and right pleural effusion requiring thoracocentesis with removal of 450 cc of fluid. He developed altered mental status and MRI scan showed punctate acute to subacute frontal infarct, but no evidence of hypoxic ischemic brain injury. There was no definite atrial fibrillation inpatient on telemetry.  He was transferred to [facility] for rehab with Zio 14 day monitor.     Follow up with general cardiology 9/22, he was off his Brilinta for unclear reasons. Plavix and valsartan started this visit.  Today he returns for HF follow up with his daughter who helped with interpreting. He cancelled his last 2 follow ups. Overall feeling fine. He has some SOB going up stairs but otherwise has no issues with dyspnea. Denies palpitations, abnormal bleeding,CP, dizziness, edema, or PND/Orthopnea. Appetite ok. No fever or chills. Weight at home stable. Taking all medications. He is asking what medications he can stop. He says he is taking too many. He remains quit from tobacco.   Past Medical History:  Diagnosis Date   Cardiac arrest [facility])    Coronary artery disease    Dysrhythmia    Myocardial infarction ([place])    Stroke [facility])     Current Outpatient Medications  Medication Sig Dispense Refill   acetaminophen (TYLENOL) 160 MG/5ML solution Take 20.3 mLs (650 mg total) by  mouth every 6 (six) hours as needed for fever. 120 mL 0   aspirin 81 MG chewable tablet Chew 1 tablet (81 mg total) by mouth daily.     carvedilol (COREG) 3.125 MG tablet Take 1 tablet (3.125 mg total) by mouth 2 (two) times daily. 60 tablet 11   clopidogrel (PLAVIX) 75 MG tablet Take 1 tablet (75 mg total) by mouth daily. 90 tablet 3   dapagliflozin propanediol (FARXIGA) 10 MG TABS tablet Take 1 tablet (10 mg total) by mouth daily before breakfast. 100 tablet 2   digoxin (LANOXIN) 0.125 MG tablet Take 1 tablet (125 mcg total) by mouth daily. 90 tablet 0   rosuvastatin (CRESTOR) 40 MG tablet Take 1 tablet (40 mg total) by mouth daily. 90 tablet 0   valsartan (DIOVAN) 40 MG tablet Take 1 tablet (40 mg total) by mouth daily. 90 tablet 3   No current facility-administered medications for this encounter.   Allergies  Allergen Reactions   Beef-Derived Products Other (See Comments)    Patient is Muslim - cannot get ANY beef, pork, or poultry products (food and medications included)   Pork-Derived Products Other (See Comments)    Patient is Muslim - cannot get ANY beef, pork, or poultry products (food and medications included)   Poultry Meal Other (See Comments)    Patient is Muslim - cannot get ANY beef, pork, or poultry products (food and medications included)   Social History   Socioeconomic History   Marital status: Widowed    Spouse name: Not on file   Number  of children: Not on file   Years of education: Not on file   Highest education level: Not on file  Occupational History   Not on file  Tobacco Use   Smoking status: Former    Packs/day: 1.00    Types: Cigarettes   Smokeless tobacco: Never  Vaping Use   Vaping Use: Unknown  Substance and Sexual Activity   Alcohol use: Not Currently   Drug use: Not Currently   Sexual activity: Not on file  Other Topics Concern   Not on file  Social History Narrative   Lives with his 4 daughters   Right Handed   Drinks 3-4 cups caffeine    Social Determinants of Health   Financial Resource Strain: Not on file  Food Insecurity: Not on file  Transportation Needs: Not on file  Physical Activity: Not on file  Stress: Not on file  Social Connections: Not on file  Intimate Partner Violence: Not on file   Family History  Family history unknown: Yes   BP 124/80    Pulse 75    Wt 63.9 kg (140 lb 12.8 oz)    SpO2 99%    BMI 23.43 kg/m   Wt Readings from Last 3 Encounters:  04/01/21 63.9 kg (140 lb 12.8 oz)  01/16/21 59.3 kg (130 lb 12.8 oz)  12/20/20 57.4 kg (126 lb 9.6 oz)   PHYSICAL EXAM: General:  NAD. No resp difficulty, thin HEENT: Normal Neck: Supple. No JVD. Carotids 2+ bilat; no bruits. No lymphadenopathy or thryomegaly appreciated. Cor: PMI nondisplaced. Regular rate & rhythm. No rubs, gallops or murmurs. Lungs: Clear Abdomen: Soft, nontender, nondistended. No hepatosplenomegaly. No bruits or masses. Good bowel sounds. Extremities: No cyanosis, clubbing, rash, edema Neuro: Alert & oriented x 3, cranial nerves grossly intact. Moves all 4 extremities w/o difficulty. Affect pleasant.  ASSESSMENT & PLAN:  1. CAD: s/p anterior STEMI with occluded proximal LAD.  Treated with DES but complicated by embolization to distal LAD (procedure completed with occluded distal LAD).  No chest pain. - Continue ASA + Plavix. - Continue Crestor 40 mg daily. LDL (7/22) 40 2. Chronic systolic heart failure: Ischemic cardiomyopathy. Echo (6/22) EF 30-35%, no LV thrombus, LAD territory WMAs, RV normal, IVC normal. He is not volume overloaded today, stable NYHA I-II symptoms. - Start Farxiga 10 mg daily. BMET today. - Continue Coreg 3.125 mg bid.  - Continue valsartan 40 mg daily. Hold off on switching to [facility] as he wants to limit med changes. - Continue digoxin 0.125 mg daily. Plan to stop if EF recovered on up-coming echo. Dig level today. - Add spiro next - He declined CR referral today. - He has repeat echo scheduled. 3.  History of cardiac arrest: VF arrest associated with STEMI. Patient shocked x 5, 15 min CPR before ROSC.  No further VT.   4. Paroxysmal Atrial fibrillation: No actual atrial fibrillation, has had ST with PACs. He is SR on ECG today. Denies palpitations. I did not see where Zio patch had been resulted. - Coreg as above. 5. CVA: MRI 6/7 with punctate acute to subacute frontal infarct. No obvious deficits.  - Continue DAPT  Follow up in 3-4 weeks with APP (add spiro) and in 12 weeks with Dr. Aundra Dubin. Long discussion about necessity of HF meds today. He does not want to be on over 6 or 7 medications.    Allena Katz, FNP-BC 04/01/21

## 2021-04-15 ENCOUNTER — Other Ambulatory Visit: Payer: Self-pay | Admitting: Family Medicine

## 2021-05-03 ENCOUNTER — Encounter (HOSPITAL_COMMUNITY): Payer: Medicare Other

## 2021-05-06 ENCOUNTER — Ambulatory Visit (HOSPITAL_COMMUNITY): Payer: Medicare Other | Attending: Internal Medicine

## 2021-05-06 ENCOUNTER — Other Ambulatory Visit: Payer: Self-pay

## 2021-05-06 DIAGNOSIS — I5041 Acute combined systolic (congestive) and diastolic (congestive) heart failure: Secondary | ICD-10-CM | POA: Diagnosis not present

## 2021-05-06 LAB — ECHOCARDIOGRAM COMPLETE
Area-P 1/2: 3.28 cm2
S' Lateral: 3.1 cm

## 2021-05-12 ENCOUNTER — Encounter: Payer: Self-pay | Admitting: Cardiovascular Disease

## 2021-05-12 NOTE — Progress Notes (Signed)
?Cardiology Office Note:   ? ?Date:  05/13/2021  ? ?ID:  Vincent Horton, DOB 01/27/1956, MRN [id number] ? ?PCP:  Dorna Mai, MD ?  ?[facility] Providers ?Cardiologist:  Aundra Dubin ? ?}   ?Referring MD: No ref. provider found  ? ?Chief Complaint  ?Patient presents with  ? Congestive Heart Failure  ?  ? ?History of Present Illness:   ? ?Originally from [place] ?Vincent Horton is a 66 y.o. male with a hx of CAD, VF arrest,  CHF ?She was seen by Dr Martinique and Dr. Aundra Dubin in the hospital .  ?Seen with his son, Vincent Horton ? ?He presented in VF arrest.  He had an emergent heart catheterization which revealed an occluded left anterior descending artery.  This was stented by Dr. Martinique. ? ?Feeling better  ?Is sore in his ribs.  Likely from getting CPR  ?Walks some  ?His mentation is close to normal  ? ?Echo from June 10 shows EF 35-40%.  ? ?Is taking ASA ?He stopped taking the Brilinta ( at the recommendation of his primary care, Dorna Mai ) perhaps due to hematuria ? ?His last lab work from September 11, 2020 reveals a glucose of 193.  His creatinine is normal at 0.64.  His HDL was 70.  LDL is 40.  Triglycerides are 80.  Total cholesterol is 126. ? ?Has stopped smoking .  ? ?May 13, 2021 ? ?Seen with daughter, Vincent Horton ? ?Pt is seen for follow up of his CAD, CHF, DM ?He had a VF arrest and was found to have an occluded mid LAD ?I saw him several months ago,  restarted Plavix ?Started Diovan at his last visit ? ?Taking his meds.  ?No real exercise, does yard  work  ? ?Echo shows improvement of hsi LVEF .  EF is now 45% ? ?Discussed the importance ot taking hsi meds during Ramadan ? ? ?Past Medical History:  ?Diagnosis Date  ? Cardiac arrest [facility])   ? Coronary artery disease   ? Dysrhythmia   ? Myocardial infarction [facility])   ? Stroke [facility])   ? ? ?Past Surgical History:  ?Procedure Laterality Date  ? CARDIAC CATHETERIZATION    ? CORONARY/GRAFT ACUTE MI REVASCULARIZATION N/A 07/21/2020  ? Procedure: Coronary/Graft Acute MI Revascularization;   Surgeon: Martinique, Peter M, MD;  Location: [place] CV LAB;  Service: Cardiovascular;  Laterality: N/A;  ? LEFT HEART CATH AND CORONARY ANGIOGRAPHY N/A 07/21/2020  ? Procedure: LEFT HEART CATH AND CORONARY ANGIOGRAPHY;  Surgeon: Martinique, Peter M, MD;  Location: [place] CV LAB;  Service: Cardiovascular;  Laterality: N/A;  ? RIGHT HEART CATH N/A 07/21/2020  ? Procedure: RIGHT HEART CATH;  Surgeon: Martinique, Peter M, MD;  Location: [place] CV LAB;  Service: Cardiovascular;  Laterality: N/A;  ? ? ?Current Medications: ?Current Meds  ?Medication Sig  ? acetaminophen (TYLENOL) 160 MG/5ML solution Take 20.3 mLs (650 mg total) by mouth every 6 (six) hours as needed for fever.  ? aspirin 81 MG chewable tablet Chew 1 tablet (81 mg total) by mouth daily.  ? carvedilol (COREG) 3.125 MG tablet Take 1 tablet (3.125 mg total) by mouth 2 (two) times daily.  ? clopidogrel (PLAVIX) 75 MG tablet Take 1 tablet (75 mg total) by mouth daily.  ? dapagliflozin propanediol (FARXIGA) 10 MG TABS tablet Take 1 tablet (10 mg total) by mouth daily before breakfast.  ? digoxin (LANOXIN) 0.125 MG tablet TAKE 1 TABLET BY MOUTH EVERY DAY  ? rosuvastatin (CRESTOR) 40 MG tablet  Take 1 tablet (40 mg total) by mouth daily.  ? valsartan (DIOVAN) 40 MG tablet Take 1 tablet (40 mg total) by mouth daily.  ?  ? ?Allergies:   Beef-derived products, Pork-derived products, and Poultry meal  ? ?Social History  ? ?Socioeconomic History  ? Marital status: Widowed  ?  Spouse name: Not on file  ? Number of children: Not on file  ? Years of education: Not on file  ? Highest education level: Not on file  ?Occupational History  ? Not on file  ?Tobacco Use  ? Smoking status: Former  ?  Packs/day: 1.00  ?  Types: Cigarettes  ? Smokeless tobacco: Never  ?Vaping Use  ? Vaping Use: Unknown  ?Substance and Sexual Activity  ? Alcohol use: Not Currently  ? Drug use: Not Currently  ? Sexual activity: Not on file  ?Other Topics Concern  ? Not on file  ?Social History  Narrative  ? Lives with his 4 daughters  ? Right Handed  ? Drinks 3-4 cups caffeine  ? ?Social Determinants of Health  ? ?Financial Resource Strain: Not on file  ?Food Insecurity: Not on file  ?Transportation Needs: Not on file  ?Physical Activity: Not on file  ?Stress: Not on file  ?Social Connections: Not on file  ?  ? ?Family History: ?The patient's Family history is unknown by patient. ? ?ROS:   ?Please see the history of present illness.    ? All other systems reviewed and are negative. ? ?EKGs/Labs/Other Studies Reviewed:   ? ?The following studies were reviewed today: ? ? ?EKG:   ? ?Recent Labs: ?08/08/2020: Magnesium 2.2 ?08/20/2020: ALT 26 ?11/26/2020: Hemoglobin 13.2; Platelets 241 ?04/01/2021: BUN 10; Creatinine, Ser 0.81; Potassium 4.1; Sodium 137  ?Recent Lipid Panel ?   ?Component Value Date/Time  ? CHOL 126 09/13/2020 1351  ? TRIG 80 09/13/2020 1351  ? HDL 70 09/13/2020 1351  ? CHOLHDL 1.8 09/13/2020 1351  ? CHOLHDL 4.9 07/21/2020 0109  ? VLDL 29 07/21/2020 0109  ? [place] 40 09/13/2020 1351  ? ? ? ?Risk Assessment/Calculations:   ?  ? ?    ? ?Physical Exam:   ? ?Physical Exam: ?Blood pressure 108/60, pulse 79, height 5\' 5"  (1.651 m), weight 141 lb (64 kg), SpO2 98 %. ? ?GEN:  Well nourished, well developed in no acute distress ?HEENT: Normal ?NECK: No JVD; No carotid bruits ?LYMPHATICS: No lymphadenopathy ?CARDIAC: RRR   ?RESPIRATORY:  Clear to auscultation without rales, wheezing or rhonchi  ?ABDOMEN: Soft, non-tender, non-distended ?MUSCULOSKELETAL:  No edema; No deformity  ?SKIN: Warm and dry ?NEUROLOGIC:  Alert and oriented x 3 ? ? ?ASSESSMENT:   ? ?No diagnosis found. ? ?PLAN:   ? ? ? ?Coronary artery disease:   no angina.  Continue Plavix.  He can probably discontinue aspirin when he is 12 months out from his stenting procedure. ? ?2.  Acute on chronic combined systolic and diastolic congestive heart failure: Continue Diovan, carvedilol.  He is also on digoxin for right now.  I think we should  continue digoxin for perhaps another 6 to 12 months.  At that point we could probably be discontinued. ? ?3.  Hyperlipidemia:  ?Continue rosuvastatin.  Anticipate that he will recheck in 6 months. ? ? ? ?We will have him see Scott or vin  in 6 months. ? ? ?Medication Adjustments/Labs and Tests Ordered: ?Current medicines are reviewed at length with the patient today.  Concerns regarding medicines are outlined above.  ?No orders  of the defined types were placed in this encounter. ? ? ?No orders of the defined types were placed in this encounter. ? ? ? ?Patient Instructions  ?Medication Instructions:  ?Take rosuvastatin at night ?Other meds can be taken in the early morning with breakfast ? ?Your physician recommends that you continue on your current medications as directed. Please refer to the Current Medication list given to you today. ? ?*If you need a refill on your cardiac medications before your next appointment, please call your pharmacy* ? ?Lab Work: ?NONE ?If you have labs (blood work) drawn today and your tests are completely normal, you will receive your results only by: ?MyChart Message (if you have MyChart) OR ?A paper copy in the mail ?If you have any lab test that is abnormal or we need to change your treatment, we will call you to review the results. ? ?Testing/Procedures: ?NONE ? ?Follow-Up: ?At [facility], you and your health needs are our priority.  As part of our continuing mission to provide you with exceptional heart care, we have created designated Provider Care Teams.  These Care Teams include your primary Cardiologist (physician) and Advanced Practice Providers (APPs -  Physician Assistants and Nurse Practitioners) who all work together to provide you with the care you need, when you need it. ? ?Your next appointment:   ?6 month(s) ? ?The format for your next appointment:   ?In Person ? ?Provider:   ?Robbie Lis, PA-C or Richardson Dopp, PA-C  ?  ? ?Signed, ?Mertie Moores, MD  ?05/13/2021 8:25  AM    ?[place] ? ?

## 2021-05-13 ENCOUNTER — Ambulatory Visit (INDEPENDENT_AMBULATORY_CARE_PROVIDER_SITE_OTHER): Payer: Medicare Other | Admitting: Cardiovascular Disease

## 2021-05-13 ENCOUNTER — Other Ambulatory Visit: Payer: Self-pay

## 2021-05-13 ENCOUNTER — Encounter: Payer: Self-pay | Admitting: Cardiovascular Disease

## 2021-05-13 VITALS — BP 108/60 | HR 79 | Ht 65.0 in | Wt 141.0 lb

## 2021-05-13 DIAGNOSIS — E782 Mixed hyperlipidemia: Secondary | ICD-10-CM

## 2021-05-13 DIAGNOSIS — I2102 ST elevation (STEMI) myocardial infarction involving left anterior descending coronary artery: Secondary | ICD-10-CM

## 2021-05-13 DIAGNOSIS — I5042 Chronic combined systolic (congestive) and diastolic (congestive) heart failure: Secondary | ICD-10-CM | POA: Diagnosis not present

## 2021-05-13 NOTE — Patient Instructions (Signed)
Medication Instructions:  ?Take rosuvastatin at night ?Other meds can be taken in the early morning with breakfast ? ?Your physician recommends that you continue on your current medications as directed. Please refer to the Current Medication list given to you today. ? ?*If you need a refill on your cardiac medications before your next appointment, please call your pharmacy* ? ?Lab Work: ?NONE ?If you have labs (blood work) drawn today and your tests are completely normal, you will receive your results only by: ?MyChart Message (if you have MyChart) OR ?A paper copy in the mail ?If you have any lab test that is abnormal or we need to change your treatment, we will call you to review the results. ? ?Testing/Procedures: ?NONE ? ?Follow-Up: ?At [facility], you and your health needs are our priority.  As part of our continuing mission to provide you with exceptional heart care, we have created designated Provider Care Teams.  These Care Teams include your primary Cardiologist (physician) and Advanced Practice Providers (APPs -  Physician Assistants and Nurse Practitioners) who all work together to provide you with the care you need, when you need it. ? ?Your next appointment:   ?6 month(s) ? ?The format for your next appointment:   ?In Person ? ?Provider:   ?Chelsea Aus, PA-C or Tereso Newcomer, PA-C  ? ?

## 2021-05-23 ENCOUNTER — Telehealth: Payer: Self-pay | Admitting: Cardiovascular Disease

## 2021-05-23 NOTE — Telephone Encounter (Signed)
Spoke with patient's daughter [name]. She states patient is not able to take his Marcelline Deist until after sunset as they are observing Ramadan during this time. ? ?Explained to [place] that Hendricks Limes is best taken in the morning due to it causing patient to urinate, and patient may be up throughout the night urinating if he takes it in the evening. Fartun verbalized understanding. Patient will need to take Farixga in the evening until the end of Ramadan and will then resume taking it in the morning before breakfast. ?

## 2021-05-23 NOTE — Telephone Encounter (Signed)
Pt c/o medication issue: ? ?1. Name of Medication: dapagliflozin propanediol (FARXIGA) 10 MG TABS tablet ? ?2. How are you currently taking this medication (dosage and times per day)? Take 1 tablet (10 mg total) by mouth daily before breakfast. ? ?3. Are you having a reaction (difficulty breathing--STAT)? no ? ?4. What is your medication issue? Daughter has questions about the medication  ?

## 2021-05-24 ENCOUNTER — Other Ambulatory Visit: Payer: Self-pay | Admitting: Family Medicine

## 2021-05-24 DIAGNOSIS — I252 Old myocardial infarction: Secondary | ICD-10-CM

## 2021-05-24 NOTE — Telephone Encounter (Signed)
Medication Refill - Medication: rosuvastatin (CRESTOR) 40 MG tablet ? ?Pt has been out of the medication for two days.  ? ?Has the patient contacted their pharmacy? Yes.   No, more refills.  ? ?(Agent: If yes, when and what did the pharmacy advise?) ? ?Preferred Pharmacy (with phone number or street name):  ?[id number] [place],  - [phone] AT [facility] [address]  ?[address] [place] [address]  ?Phone: [phone] Fax: [phone]  ?Hours: Not open 24 hours  ? ?Has the patient been seen for an appointment in the last year OR does the patient have an upcoming appointment? Yes.   ? ?Agent: Please be advised that RX refills may take up to 3 business days. We ask that you follow-up with your pharmacy.  ?

## 2021-05-27 MED ORDER — ROSUVASTATIN CALCIUM 40 MG PO TABS: 40.0000 mg | ORAL_TABLET | Freq: Every day | ORAL | 0 refills | Status: DC

## 2021-05-27 NOTE — Telephone Encounter (Signed)
Fartun patient daughter called in to inform Dr Andrey Campanile that patient will need to have his medication today rosuvastatin (CRESTOR) 40 MG tablet ?

## 2021-05-27 NOTE — Telephone Encounter (Signed)
Requested Prescriptions  ?Pending Prescriptions Disp Refills  ?? rosuvastatin (CRESTOR) 40 MG tablet 90 tablet 0  ?  Sig: Take 1 tablet (40 mg total) by mouth daily.  ?  ? Cardiovascular:  Antilipid - Statins 2 Failed - 05/27/2021 10:44 PM  ?  ?  Failed - Lipid Panel in normal range within the last 12 months  ?  Cholesterol, Total  ?Date Value Ref Range Status  ?09/13/2020 126 100 - 199 mg/dL Final  ? ?LDL Chol Calc (NIH)  ?Date Value Ref Range Status  ?09/13/2020 40 0 - 99 mg/dL Final  ? ?HDL  ?Date Value Ref Range Status  ?09/13/2020 70 >39 mg/dL Final  ? ?Triglycerides  ?Date Value Ref Range Status  ?09/13/2020 80 0 - 149 mg/dL Final  ? ?  ?  ?  Passed - Cr in normal range and within 360 days  ?  Creatinine, Ser  ?Date Value Ref Range Status  ?04/01/2021 0.81 0.61 - 1.24 mg/dL Final  ?   ?  ?  Passed - Patient is not pregnant  ?  ?  Passed - Valid encounter within last 12 months  ?  Recent Outpatient Visits   ?      ? 4 months ago Diarrhea, unspecified type  ? Primary Care at [facility], Lauris Poag, MD  ? 7 months ago History of ST elevation myocardial infarction (STEMI)  ? Primary Care at [facility], Lauris Poag, MD  ? 7 months ago Acute cystitis with hematuria  ? Primary Care at [facility], Cari S, PA-C  ? 8 months ago History of ST elevation myocardial infarction (STEMI)  ? Primary Care at [facility], Otis S, PA-C  ?  ?  ? ?  ?  ?  ? ? ?

## 2021-05-28 ENCOUNTER — Other Ambulatory Visit: Payer: Self-pay | Admitting: *Deleted

## 2021-05-28 DIAGNOSIS — I252 Old myocardial infarction: Secondary | ICD-10-CM

## 2021-05-28 MED ORDER — ROSUVASTATIN CALCIUM 40 MG PO TABS: 40.0000 mg | ORAL_TABLET | Freq: Every day | ORAL | 0 refills | Status: DC

## 2021-06-05 ENCOUNTER — Emergency Department (HOSPITAL_COMMUNITY): Payer: Medicare Other

## 2021-06-05 ENCOUNTER — Emergency Department (HOSPITAL_COMMUNITY)
Admission: EM | Admit: 2021-06-05 | Discharge: 2021-06-05 | Disposition: A | Discharge status: Home / Self Care | Payer: Medicare Other | Attending: Emergency Medicine | Admitting: Emergency Medicine

## 2021-06-05 ENCOUNTER — Other Ambulatory Visit: Payer: Self-pay

## 2021-06-05 DIAGNOSIS — Z7982 Long term (current) use of aspirin: Secondary | ICD-10-CM | POA: Diagnosis not present

## 2021-06-05 DIAGNOSIS — J189 Pneumonia, unspecified organism: Secondary | ICD-10-CM | POA: Diagnosis not present

## 2021-06-05 DIAGNOSIS — R000 Tachycardia, unspecified: Secondary | ICD-10-CM | POA: Insufficient documentation

## 2021-06-05 DIAGNOSIS — U071 COVID-19: Secondary | ICD-10-CM | POA: Diagnosis not present

## 2021-06-05 DIAGNOSIS — Z7902 Long term (current) use of antithrombotics/antiplatelets: Secondary | ICD-10-CM | POA: Diagnosis not present

## 2021-06-05 DIAGNOSIS — I2510 Atherosclerotic heart disease of native coronary artery without angina pectoris: Secondary | ICD-10-CM | POA: Insufficient documentation

## 2021-06-05 DIAGNOSIS — R509 Fever, unspecified: Secondary | ICD-10-CM | POA: Diagnosis present

## 2021-06-05 LAB — COMPREHENSIVE METABOLIC PANEL
ALT: 37 U/L (ref 0–44)
AST: 39 U/L (ref 15–41)
Albumin: 3.8 g/dL (ref 3.5–5.0)
Alkaline Phosphatase: 81 U/L (ref 38–126)
Anion gap: 9 (ref 5–15)
BUN: 9 mg/dL (ref 8–23)
CO2: 22 mmol/L (ref 22–32)
Calcium: 8.6 mg/dL — ABNORMAL LOW (ref 8.9–10.3)
Chloride: 106 mmol/L (ref 98–111)
Creatinine, Ser: 1.11 mg/dL (ref 0.61–1.24)
GFR, Estimated: >60 mL/min (ref >60)
Glucose, Bld: 144 mg/dL — ABNORMAL HIGH (ref 70–99)
Potassium: 4.1 mmol/L (ref 3.5–5.1)
Sodium: 137 mmol/L (ref 135–145)
Total Bilirubin: 0.6 mg/dL (ref 0.3–1.2)
Total Protein: 6.4 g/dL — ABNORMAL LOW (ref 6.5–8.1)

## 2021-06-05 LAB — RESP PANEL BY RT-PCR (FLU A&B, COVID) ARPGX2
Influenza A by PCR: NEGATIVE
Influenza B by PCR: NEGATIVE
SARS Coronavirus 2 by RT PCR: POSITIVE — AB

## 2021-06-05 LAB — URINALYSIS, ROUTINE W REFLEX MICROSCOPIC
Bacteria, UA: NONE SEEN
Bilirubin Urine: NEGATIVE
Glucose, UA: NEGATIVE mg/dL
Ketones, ur: NEGATIVE mg/dL
Leukocytes,Ua: NEGATIVE
Nitrite: NEGATIVE
Protein, ur: NEGATIVE mg/dL
Specific Gravity, Urine: 1.015 (ref 1.005–1.030)
pH: 5 (ref 5.0–8.0)

## 2021-06-05 LAB — CBC WITH DIFFERENTIAL/PLATELET
Abs Immature Granulocytes: 0.02 10*3/uL (ref 0.00–0.07)
Basophils Absolute: 0.1 10*3/uL (ref 0.0–0.1)
Basophils Relative: 1 %
Eosinophils Absolute: 0.2 10*3/uL (ref 0.0–0.5)
Eosinophils Relative: 3 %
HCT: 39 % (ref 39.0–52.0)
Hemoglobin: 12.8 g/dL — ABNORMAL LOW (ref 13.0–17.0)
Immature Granulocytes: 0 %
Lymphocytes Relative: 10 %
Lymphs Abs: 0.6 10*3/uL — ABNORMAL LOW (ref 0.7–4.0)
MCH: 29.9 pg (ref 26.0–34.0)
MCHC: 32.8 g/dL (ref 30.0–36.0)
MCV: 91.1 fL (ref 80.0–100.0)
Monocytes Absolute: 0.6 10*3/uL (ref 0.1–1.0)
Monocytes Relative: 10 %
Neutro Abs: 4.2 10*3/uL (ref 1.7–7.7)
Neutrophils Relative %: 76 %
Platelets: 189 10*3/uL (ref 150–400)
RBC: 4.28 MIL/uL (ref 4.22–5.81)
RDW: 13.4 % (ref 11.5–15.5)
WBC: 5.6 10*3/uL (ref 4.0–10.5)
nRBC: 0 % (ref 0.0–0.2)

## 2021-06-05 LAB — TROPONIN I (HIGH SENSITIVITY)
Troponin I (High Sensitivity): 13 ng/L (ref <18)
Troponin I (High Sensitivity): 15 ng/L (ref <18)

## 2021-06-05 LAB — LACTIC ACID, PLASMA
Lactic Acid, Venous: 1 mmol/L (ref 0.5–1.9)
Lactic Acid, Venous: 1.3 mmol/L (ref 0.5–1.9)

## 2021-06-05 LAB — DIGOXIN LEVEL: Digoxin Level: 0.4 ng/mL — ABNORMAL LOW (ref 0.8–2.0)

## 2021-06-05 MED ORDER — ACETAMINOPHEN 325 MG PO TABS
650.0000 mg | ORAL_TABLET | Freq: Once | ORAL | Status: AC
  Administered 2021-06-05: 650 mg via ORAL
  Filled 2021-06-05: qty 2

## 2021-06-05 MED ORDER — SODIUM CHLORIDE 0.9 % IV SOLN
500.0000 mg | INTRAVENOUS | Status: DC
  Administered 2021-06-05: 500 mg via INTRAVENOUS
  Filled 2021-06-05: qty 5

## 2021-06-05 MED ORDER — SODIUM CHLORIDE 0.9 % IV BOLUS
1000.0000 mL | Freq: Once | INTRAVENOUS | Status: AC
  Administered 2021-06-05: 1000 mL via INTRAVENOUS

## 2021-06-05 MED ORDER — DOXYCYCLINE HYCLATE 100 MG PO CAPS: 100.0000 mg | ORAL_CAPSULE | Freq: Two times a day (BID) | ORAL | 0 refills | Status: DC

## 2021-06-05 MED ORDER — SODIUM CHLORIDE 0.9 % IV SOLN
1.0000 g | Freq: Once | INTRAVENOUS | Status: AC
  Administered 2021-06-05: 1 g via INTRAVENOUS
  Filled 2021-06-05: qty 10

## 2021-06-05 MED ORDER — MOLNUPIRAVIR EUA 200MG CAPSULE: 4.0000 | ORAL_CAPSULE | Freq: Two times a day (BID) | ORAL | 0 refills | Status: AC

## 2021-06-05 NOTE — ED Notes (Signed)
Pt alert, NAD, calm, passively interactive, quiet, family x2 at [facility].  ?

## 2021-06-05 NOTE — ED Provider Notes (Signed)
?[place] ?Provider Note ? ? ?CSN: [id number] ?Arrival date & time: 06/05/21  [id number] ? ?  ? ?History ? ?Chief Complaint  ?Patient presents with  ? Fever  ? Headache  ? Chest Pain  ? ? ?Luisfernando Katzenmeyer is a 66 y.o. male. ? ?Pt is a 66 yo male with pmhx significant for CAD and CVA.  Pt speaks some [place], but primarily [place].  He requests that his son translate.  Pt has had fever and headache and pain in his chest when he coughs since last night.  Pt was given tylenol in triage and fever has improved.   ? ? ?  ? ?Home Medications ?Prior to Admission medications   ?Medication Sig Start Date End Date Taking? Authorizing Provider  ?doxycycline (VIBRAMYCIN) 100 MG capsule Take 1 capsule (100 mg total) by mouth 2 (two) times daily. 06/05/21  Yes Isla Pence, MD  ?molnupiravir EUA (LAGEVRIO) 200 mg CAPS capsule Take 4 capsules (800 mg total) by mouth 2 (two) times daily for 5 days. 06/05/21 06/10/21 Yes Isla Pence, MD  ?acetaminophen (TYLENOL) 160 MG/5ML solution Take 20.3 mLs (650 mg total) by mouth every 6 (six) hours as needed for fever. 08/23/20   Angiulli, Lavon Paganini, PA-C  ?aspirin 81 MG chewable tablet Chew 1 tablet (81 mg total) by mouth daily. 08/23/20   Angiulli, Lavon Paganini, PA-C  ?carvedilol (COREG) 3.125 MG tablet Take 1 tablet (3.125 mg total) by mouth 2 (two) times daily. 12/13/20 12/13/21  Rafael Bihari, FNP  ?clopidogrel (PLAVIX) 75 MG tablet Take 1 tablet (75 mg total) by mouth daily. 11/09/20   Nahser, Wonda Cheng, MD  ?dapagliflozin propanediol (FARXIGA) 10 MG TABS tablet Take 1 tablet (10 mg total) by mouth daily before breakfast. 04/01/21   Milford, Maricela Bo, FNP  ?digoxin (LANOXIN) 0.125 MG tablet TAKE 1 TABLET BY MOUTH EVERY DAY 04/15/21   Dorna Mai, MD  ?rosuvastatin (CRESTOR) 40 MG tablet Take 1 tablet (40 mg total) by mouth daily. 05/28/21   Dorna Mai, MD  ?valsartan (DIOVAN) 40 MG tablet Take 1 tablet (40 mg total) by mouth daily. 11/09/20   Nahser, Wonda Cheng,  MD  ?   ? ?Allergies    ?Beef-derived products, Pork-derived products, and Poultry meal   ? ?Review of Systems   ?Review of Systems  ?Constitutional:  Positive for fever.  ?Respiratory:  Positive for cough.   ?All other systems reviewed and are negative. ? ?Physical Exam ?Updated Vital Signs ?BP 90/64   Pulse 88   Temp 99 ?F (37.2 ?C) (Oral)   Resp (!) 27   SpO2 100%  ?Physical Exam ?Vitals and nursing note reviewed.  ?Constitutional:   ?   Appearance: He is well-developed.  ?HENT:  ?   Head: Normocephalic and atraumatic.  ?   Mouth/Throat:  ?   Mouth: Mucous membranes are moist.  ?   Pharynx: Oropharynx is clear.  ?Eyes:  ?   Extraocular Movements: Extraocular movements intact.  ?   Pupils: Pupils are equal, round, and reactive to light.  ?Cardiovascular:  ?   Rate and Rhythm: Normal rate and regular rhythm.  ?   Heart sounds: Normal heart sounds.  ?Pulmonary:  ?   Effort: Pulmonary effort is normal.  ?   Breath sounds: Normal breath sounds.  ?Abdominal:  ?   General: Bowel sounds are normal.  ?   Palpations: Abdomen is soft.  ?Musculoskeletal:     ?   General: Normal range of  motion.  ?   Cervical back: Normal range of motion and neck supple.  ?Skin: ?   General: Skin is warm and dry.  ?Neurological:  ?   Mental Status: He is alert and oriented to person, place, and time.  ?Psychiatric:     ?   Mood and Affect: Mood normal.     ?   Speech: Speech normal.     ?   Behavior: Behavior normal.  ? ? ?ED Results / Procedures / Treatments   ?Labs ?(all labs ordered are listed, but only abnormal results are displayed) ?Labs Reviewed  ?RESP PANEL BY RT-PCR (FLU A&B, COVID) ARPGX2 - Abnormal; Notable for the following components:  ?    Result Value  ? SARS Coronavirus 2 by RT PCR POSITIVE (*)   ? All other components within normal limits  ?COMPREHENSIVE METABOLIC PANEL - Abnormal; Notable for the following components:  ? Glucose, Bld 144 (*)   ? Calcium 8.6 (*)   ? Total Protein 6.4 (*)   ? All other components within  normal limits  ?CBC WITH DIFFERENTIAL/PLATELET - Abnormal; Notable for the following components:  ? Hemoglobin 12.8 (*)   ? Lymphs Abs 0.6 (*)   ? All other components within normal limits  ?URINALYSIS, ROUTINE W REFLEX MICROSCOPIC - Abnormal; Notable for the following components:  ? Hgb urine dipstick MODERATE (*)   ? All other components within normal limits  ?CULTURE, BLOOD (ROUTINE X 2)  ?CULTURE, BLOOD (ROUTINE X 2)  ?LACTIC ACID, PLASMA  ?LACTIC ACID, PLASMA  ?DIGOXIN LEVEL  ?TROPONIN I (HIGH SENSITIVITY)  ?TROPONIN I (HIGH SENSITIVITY)  ? ? ?EKG ?EKG Interpretation ? ?Date/Time:  Wednesday June 05 2021 [id number] EDT ?Ventricular Rate:  117 ?PR Interval:  164 ?QRS Duration: 82 ?QT Interval:  294 ?QTC Calculation: 410 ?R Axis:   43 ?Text Interpretation: Sinus tachycardia Anteroseptal infarct , age undetermined Abnormal ECG When compared with ECG of 13-Dec-2020 10:13, PREVIOUS ECG IS PRESENT Since last tracing rate faster Confirmed by Isla Pence [phone]) on 06/05/2021 1:45:41 PM ? ?Radiology ?DG Chest 2 View ? ?Result Date: 06/05/2021 ?CLINICAL DATA:  Chest pain EXAM: CHEST - 2 VIEW COMPARISON:  08/14/2020 FINDINGS: Interstitial opacity with probable airway cuffing. No focal air bronchogram, Kerley line, effusion, or pneumothorax. Normal heart size and mediastinal contours. Coronary stent seen on the lateral view interstitial coarsening with. IMPRESSION: Patchy density at the bases, suspect bronchitis/bronchopneumonia. Electronically Signed   By: Jorje Guild M.D.   On: 06/05/2021 10:35   ? ?Procedures ?Procedures  ? ? ?Medications Ordered in ED ?Medications  ?azithromycin (ZITHROMAX) 500 mg in sodium chloride 0.9 % 250 mL IVPB (has no administration in time range)  ?acetaminophen (TYLENOL) tablet 650 mg (650 mg Oral Given 06/05/21 1013)  ?sodium chloride 0.9 % bolus 1,000 mL (1,000 mLs Intravenous New Bag/Given 06/05/21 1509)  ?cefTRIAXone (ROCEPHIN) 1 g in sodium chloride 0.9 % 100 mL IVPB (1 g Intravenous New  Bag/Given 06/05/21 1510)  ? ? ?ED Course/ Medical Decision Making/ A&P ?  ?                        ?Medical Decision Making ?Amount and/or Complexity of Data Reviewed ?Labs: ordered. ?Radiology: ordered. ? ?Risk ?OTC drugs. ? ? ?This patient presents to the ED for concern of fever, this involves an extensive number of treatment options, and is a complaint that carries with it a high risk of complications and morbidity.  The differential diagnosis  includes sepsis, sirs, infection ? ? ?Co morbidities that complicate the patient evaluation ? ?Cad and cva ? ? ?Additional history obtained: ? ?Additional history obtained from epic review ?External records from outside source obtained and reviewed including son ? ? ?Lab Tests: ? ?I Ordered, and personally interpreted labs.  The pertinent results include:  cbc nl, ua neg other than some hgb, but no rbcs.  Covid is +. ? ? ?Imaging Studies ordered: ? ?I ordered imaging studies including CXR  ?I independently visualized and interpreted imaging which showed  ?  ?IMPRESSION:  ?Patchy density at the bases, suspect bronchitis/bronchopneumonia.  ?   ? ?I agree with the radiologist interpretation ? ? ?Cardiac Monitoring: ? ?The patient was maintained on a cardiac monitor.  I personally viewed and interpreted the cardiac monitored which showed an underlying rhythm of: nsr ? ? ?Medicines ordered and prescription drug management: ? ?I ordered medication including rocephin/zithromax/ivfs  for cap  ?Reevaluation of the patient after these medicines showed that the patient improved ?I have reviewed the patients home medicines and have made adjustments as needed ? ? ?Test Considered: ? ?Ct chest ? ? ?Critical Interventions: ? ?abx ? ? ? ?Problem List / ED Course: ? ?CAP/fever:  pt treated with rocephin/zithromax.  Pt's bp is soft, so he is given 1L NS.  Pt is not hypoxic.  Pt feels much better after the fluids and abx.  He wants to go home.  Pt is told to hold his coreg tonight and tomorrow  morning.  He is to hold his Valsartan tomorrow morning.  Pt is a practicing Muslim and I encouraged him not to fast while he is sick.  He understands and will eat.   ?Covid-19:  He is + for Covid.  He is on

## 2021-06-05 NOTE — Discharge Instructions (Addendum)
DO NOT TAKE Coreg (carvedilol) tonight or tomorrow morning.  DO NOT TAKE Diovan (valsartan) tomorrow morning. ?

## 2021-06-05 NOTE — ED Triage Notes (Signed)
Pt. Stated, Vincent Horton had a fever, headache, and chest pain since last night ?

## 2021-06-06 ENCOUNTER — Encounter (HOSPITAL_COMMUNITY): Payer: Medicare Other | Admitting: Cardiology

## 2021-06-10 LAB — CULTURE, BLOOD (ROUTINE X 2)
Culture: NO GROWTH
Culture: NO GROWTH

## 2021-06-18 ENCOUNTER — Ambulatory Visit (HOSPITAL_COMMUNITY)
Admit: 2021-06-18 | Discharge: 2021-06-18 | Disposition: A | Discharge status: Home / Self Care | Payer: Medicare Other | Attending: Cardiology | Admitting: Cardiology

## 2021-06-18 ENCOUNTER — Encounter (HOSPITAL_COMMUNITY): Payer: Self-pay | Admitting: Cardiology

## 2021-06-18 ENCOUNTER — Other Ambulatory Visit (HOSPITAL_COMMUNITY): Payer: Self-pay

## 2021-06-18 VITALS — BP 118/68 | HR 75 | Wt 138.8 lb

## 2021-06-18 DIAGNOSIS — I255 Ischemic cardiomyopathy: Secondary | ICD-10-CM | POA: Insufficient documentation

## 2021-06-18 DIAGNOSIS — I5042 Chronic combined systolic (congestive) and diastolic (congestive) heart failure: Secondary | ICD-10-CM | POA: Diagnosis not present

## 2021-06-18 DIAGNOSIS — E782 Mixed hyperlipidemia: Secondary | ICD-10-CM | POA: Diagnosis not present

## 2021-06-18 DIAGNOSIS — Z955 Presence of coronary angioplasty implant and graft: Secondary | ICD-10-CM | POA: Diagnosis not present

## 2021-06-18 DIAGNOSIS — Z7902 Long term (current) use of antithrombotics/antiplatelets: Secondary | ICD-10-CM | POA: Insufficient documentation

## 2021-06-18 DIAGNOSIS — I2510 Atherosclerotic heart disease of native coronary artery without angina pectoris: Secondary | ICD-10-CM | POA: Insufficient documentation

## 2021-06-18 DIAGNOSIS — Z8674 Personal history of sudden cardiac arrest: Secondary | ICD-10-CM | POA: Insufficient documentation

## 2021-06-18 DIAGNOSIS — J90 Pleural effusion, not elsewhere classified: Secondary | ICD-10-CM | POA: Insufficient documentation

## 2021-06-18 DIAGNOSIS — I5022 Chronic systolic (congestive) heart failure: Secondary | ICD-10-CM | POA: Insufficient documentation

## 2021-06-18 DIAGNOSIS — Z79899 Other long term (current) drug therapy: Secondary | ICD-10-CM | POA: Diagnosis not present

## 2021-06-18 DIAGNOSIS — I252 Old myocardial infarction: Secondary | ICD-10-CM | POA: Insufficient documentation

## 2021-06-18 DIAGNOSIS — I959 Hypotension, unspecified: Secondary | ICD-10-CM | POA: Insufficient documentation

## 2021-06-18 DIAGNOSIS — Z8673 Personal history of transient ischemic attack (TIA), and cerebral infarction without residual deficits: Secondary | ICD-10-CM | POA: Insufficient documentation

## 2021-06-18 LAB — LIPID PANEL
Cholesterol: 87 mg/dL (ref 0–200)
HDL: 38 mg/dL — ABNORMAL LOW (ref >40)
LDL Cholesterol: 22 mg/dL (ref 0–99)
Total CHOL/HDL Ratio: 2.3 RATIO
Triglycerides: 133 mg/dL (ref <150)
VLDL: 27 mg/dL (ref 0–40)

## 2021-06-18 LAB — BASIC METABOLIC PANEL
Anion gap: 3 — ABNORMAL LOW (ref 5–15)
BUN: 10 mg/dL (ref 8–23)
CO2: 25 mmol/L (ref 22–32)
Calcium: 8.5 mg/dL — ABNORMAL LOW (ref 8.9–10.3)
Chloride: 108 mmol/L (ref 98–111)
Creatinine, Ser: 1.02 mg/dL (ref 0.61–1.24)
GFR, Estimated: >60 mL/min (ref >60)
Glucose, Bld: 126 mg/dL — ABNORMAL HIGH (ref 70–99)
Potassium: 4.4 mmol/L (ref 3.5–5.1)
Sodium: 136 mmol/L (ref 135–145)

## 2021-06-18 MED ORDER — ENTRESTO 24-26 MG PO TABS: 1.0000 | ORAL_TABLET | Freq: Two times a day (BID) | ORAL | 11 refills | Status: DC

## 2021-06-18 NOTE — Progress Notes (Signed)
? ?ADVANCED HF CLINIC NOTE ? ?Primary Care: Dr. Dorna Mai ?Primary Cardiologist: Dr. Acie Fredrickson ?HF Cardiologist: Dr. Aundra Dubin ? ?HPI: ?Vincent Horton is a 66 y.o.male with a hx of CAD, VF arrest, and systolic CHF/iCM. Patient is [place] and speaks minimal [place].  ? ?He was admitted 07/21/20 with Vfib arrest.  He had an emergent heart catheterization which revealed an occluded left anterior descending artery.  This was stented by Dr. Martinique and he was started on ASA + Brilinta. Echocardiogram showed EF of 30 to 35% with moderately decreased LV function. He was kept in the ICU and required vasopressors for hypotension and respiratory failure needing ventilatory support.  He developed aspiration pneumonia and right pleural effusion requiring thoracocentesis with removal of 450 cc of fluid. He developed altered mental status and MRI scan showed punctate acute to subacute frontal infarct, but no evidence of hypoxic ischemic brain injury. There was no definite atrial fibrillation inpatient on telemetry.  He was transferred to [facility] for rehab with Zio 14 day monitor, this showed no atrial fibrillation.    ? ?Follow up with general cardiology 9/22, he was off his Brilinta for unclear reasons. Plavix and valsartan started this visit. ? ?Echo was repeated in 3/23, showing EF improved to 45% with apical septal akinesis, normal RV. ? ?Today he returns for HF followup with a relative who interprets.  He seems to be doing well.  Denies exertional dyspnea or chest pain.  Had recent COVID-19 infection but symptoms have resolved.  No memory issues now (in past, thought to be related to CVA). Walks for exercise.  No orthopnea/PND.  No lightheadedness.  ? ?Labs (7/22): LDL 40 ?Labs (4/23): digoxin 0.4, K 4.1, creatinine 1.11 ? ?PMH: ?1. CAD: Anterior STEMI in 5/22 with totally occluded pLAD treated with DES.  The was complicated by distal embolization with occluded distal LAD.  ?2. Chronic systolic CHF: Ischemic cardiomyopathy.  Echo (5/22)  with EF 30-35%, normal RV.  ?- Echo (3/23): EF 45%, apical septal akinesis, normal RV.  ?3. VF arrest in setting of STEMI ?4. Dysphagia: Aspiration risk.  ?5. CVA: Subacute frontal infarct noted 5/22 admission.  Atrial fibrillation has not been visualized.  ?6. COVID-19 4/23.  ? ? ?Current Outpatient Medications  ?Medication Sig Dispense Refill  ? acetaminophen (TYLENOL) 160 MG/5ML solution Take 20.3 mLs (650 mg total) by mouth every 6 (six) hours as needed for fever. 120 mL 0  ? aspirin 81 MG chewable tablet Chew 1 tablet (81 mg total) by mouth daily.    ? carvedilol (COREG) 3.125 MG tablet Take 1 tablet (3.125 mg total) by mouth 2 (two) times daily. 60 tablet 11  ? clopidogrel (PLAVIX) 75 MG tablet Take 1 tablet (75 mg total) by mouth daily. 90 tablet 3  ? dapagliflozin propanediol (FARXIGA) 10 MG TABS tablet Take 1 tablet (10 mg total) by mouth daily before breakfast. 100 tablet 2  ? rosuvastatin (CRESTOR) 40 MG tablet Take 1 tablet (40 mg total) by mouth daily. 90 tablet 0  ? sacubitril-valsartan (ENTRESTO) 24-26 MG Take 1 tablet by mouth 2 (two) times daily. 60 tablet 11  ? ?No current facility-administered medications for this encounter.  ? ?Allergies  ?Allergen Reactions  ? Beef-Derived Products Other (See Comments)  ?  Patient is Muslim - cannot get ANY beef, pork, or poultry products (food and medications included)  ? Pork-Derived Products Other (See Comments)  ?  Patient is Muslim - cannot get ANY beef, pork, or poultry products (food and medications included)  ?  Poultry Meal Other (See Comments)  ?  Patient is Muslim - cannot get ANY beef, pork, or poultry products (food and medications included)  ? ?Social History  ? ?Socioeconomic History  ? Marital status: Widowed  ?  Spouse name: Not on file  ? Number of children: Not on file  ? Years of education: Not on file  ? Highest education level: Not on file  ?Occupational History  ? Not on file  ?Tobacco Use  ? Smoking status: Former  ?  Packs/day: 1.00  ?   Types: Cigarettes  ? Smokeless tobacco: Never  ?Vaping Use  ? Vaping Use: Unknown  ?Substance and Sexual Activity  ? Alcohol use: Not Currently  ? Drug use: Not Currently  ? Sexual activity: Not on file  ?Other Topics Concern  ? Not on file  ?Social History Narrative  ? Lives with his 4 daughters  ? Right Handed  ? Drinks 3-4 cups caffeine  ? ?Social Determinants of Health  ? ?Financial Resource Strain: Not on file  ?Food Insecurity: Not on file  ?Transportation Needs: Not on file  ?Physical Activity: Not on file  ?Stress: Not on file  ?Social Connections: Not on file  ?Intimate Partner Violence: Not on file  ? ?Family History  ?Family history unknown: Yes  ? ?BP 118/68   Pulse 75   Wt 63 kg (138 lb 12.8 oz)   SpO2 98%   BMI 23.10 kg/m?  ? ?Wt Readings from Last 3 Encounters:  ?06/18/21 63 kg (138 lb 12.8 oz)  ?05/13/21 64 kg (141 lb)  ?04/01/21 63.9 kg (140 lb 12.8 oz)  ? ?PHYSICAL EXAM: ?General: NAD ?Neck: No JVD, no thyromegaly or thyroid nodule.  ?Lungs: Clear to auscultation bilaterally with normal respiratory effort. ?CV: Nondisplaced PMI.  Heart regular S1/S2, no S3/S4, no murmur.  No peripheral edema.  No carotid bruit.  Normal pedal pulses.  ?Abdomen: Soft, nontender, no hepatosplenomegaly, no distention.  ?Skin: Intact without lesions or rashes.  ?Neurologic: Alert and oriented x 3.  ?Psych: Normal affect. ?Extremities: No clubbing or cyanosis.  ?HEENT: Normal.  ? ?ASSESSMENT & PLAN:  ?1. CAD: S/p anterior STEMI with occluded proximal LAD in 5/22.  Treated with DES but complicated by embolization to distal LAD (procedure completed with occluded distal LAD).  No chest pain. ?- Continue ASA + Plavix.  At the end of a year, continue on Plavix 75 daily.  ?- Continue Crestor 40 mg daily. Check lipids today.  ?2. Chronic systolic heart failure: Ischemic cardiomyopathy. Echo (6/22) with EF 30-35%, no LV thrombus, LAD territory WMAs, RV normal, IVC normal. Echo in 3/23 with EF improved to 45%, normal RV.   NYHA class I-II, not volume overloaded on exam.  ?- Continue Farxiga 10 mg daily.  ?- Continue Coreg 3.125 mg bid.  ?- Stop valsartan, start Entresto 24/26 bid.  BMET today and in 10 days.  ?- Stop digoxin with EF up to 45%.  ?- He is out of ICD range.  ?3. History of cardiac arrest: VF arrest associated with STEMI. Patient shocked x 5, 15 min CPR before ROSC.  No further VT.   ?- Now out of ICD range.  ?- Continue Coreg.  ?4. CVA: MRI 6/7 with punctate acute to subacute frontal infarct. No obvious deficits.  ?- Continue DAPT ? ?Followup in 4 months with APP.  ? ?Loralie Champagne ?06/18/2021 ? ?

## 2021-06-18 NOTE — Patient Instructions (Signed)
Medication Changes: ? ?Stop digoxin ? ?Stop Valsartan ? ?Start Entresto 24-26 Twice daily ? ? ?Lab Work: ? ?Labs done today, your results will be available in MyChart, we will contact you for abnormal readings. ? ? ?Testing/Procedures: ? ?Repeat blood work in 2 weeks  ? ?Referrals: ? ?none ? ?Special Instructions // Education: ? ?none ? ?Follow-Up in: 4 months  ? ?At the [facility], you and your health needs are our priority. We have a designated team specialized in the treatment of Heart Failure. This Care Team includes your primary Heart Failure Specialized Cardiologist (physician), Advanced Practice Providers (APPs- Physician Assistants and Nurse Practitioners), and Pharmacist who all work together to provide you with the care you need, when you need it.  ? ?You may see any of the following providers on your designated Care Team at your next follow up: ? ?Dr Arvilla Meres ?Dr Marca Ancona ?Tonye Becket, NP ?Robbie Lis, PA ?Jessica Milford,NP ?Anna Genre, PA ?Karle Plumber, PharmD ? ? ?Please be sure to bring in all your medications bottles to every appointment.  ? ?Need to Contact us: ? ?If you have any questions or concerns before your next appointment please send [place] a message through [place] or call our office at [phone].   ? ?TO LEAVE A MESSAGE FOR THE NURSE SELECT OPTION 2, PLEASE LEAVE A MESSAGE INCLUDING: ?YOUR NAME ?DATE OF BIRTH ?CALL BACK NUMBER ?REASON FOR CALL**this is important as we prioritize the call backs ? ?YOU WILL RECEIVE A CALL BACK THE SAME DAY AS LONG AS YOU CALL BEFORE 4:00 PM ? ? ?

## 2021-06-20 ENCOUNTER — Ambulatory Visit (INDEPENDENT_AMBULATORY_CARE_PROVIDER_SITE_OTHER): Payer: Medicare Other | Admitting: Neurology

## 2021-06-20 ENCOUNTER — Encounter: Payer: Self-pay | Admitting: Neurology

## 2021-06-20 VITALS — BP 112/62 | HR 84 | Ht 64.0 in | Wt 136.0 lb

## 2021-06-20 DIAGNOSIS — I6930 Unspecified sequelae of cerebral infarction: Secondary | ICD-10-CM

## 2021-06-20 DIAGNOSIS — G3184 Mild cognitive impairment, so stated: Secondary | ICD-10-CM | POA: Diagnosis not present

## 2021-06-20 NOTE — Patient Instructions (Signed)
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  I encouraged him to increase participation in cognitively challenging activities like solving crossword puzzles, playing bridge and sudoku.  We also discussed memory compensation strategies.  Continue aspirin Plavix for stroke prevention given his cardiac disease as well as maintain aggressive risk factor modification with strict control of hypertension with blood pressure goal below 130/90, lipids with LDL cholesterol goal below 70 mg percent and diabetes with hemoglobin A1c goal below 6.5%.  Return for follow-up in the future in a year or call earlier if necessary. ?Memory Compensation Strategies ? ?Use "WARM" strategy. ? W= write it down ? A= associate it ? R= repeat it ? M= make a mental note ? ?2.   You can keep a Glass blower/designer. ? Use a 3-ring notebook with sections for the following: calendar, important names and phone numbers,  medications, doctors' names/phone numbers, lists/reminders, and a section to journal what you did  each day.  ? ?3.    Use a calendar to write appointments down. ? ?4.    Write yourself a schedule for the day. ? This can be placed on the calendar or in a separate section of the Memory Notebook.  Keeping a  regular schedule can help memory. ? ?5.    Use medication organizer with sections for each day or morning/evening pills. ? You may need help loading it ? ?6.    Keep a basket, or pegboard by the door. ? Place items that you need to take out with you in the basket or on the pegboard.  You may also want to  include a message board for reminders. ? ?7.    Use sticky notes. ? Place sticky notes with reminders in a place where the task is performed.  For example: " turn off the  stove" placed by the stove, "lock the door" placed on the door at eye level, " take your medications" on  the bathroom mirror or by the place  where you normally take your medications. ? ?8.    Use alarms/timers. ? Use while cooking to remind yourself to check on food or as a reminder to take your medicine, or as a  reminder to make a call, or as a reminder to perform another task, etc. ? ?

## 2021-06-20 NOTE — Progress Notes (Signed)
?[facility] ?[address] ?[place]. [place] [address] ?[phone] ? ?     OFFICE FOLLOW UP VISIT NOTE ? ?[name] ?Date of Birth:  1955/10/13 ?Medical Record Number:  [id number]  ? ?Referring MD:  Marlowe Shores, PA-c ? ?Reason for Referral:  Stroke ? ?HPI: Initial visit 09/13/2020 :Vincent Horton is a 66 year old [place] male seen today for initial office consultation visit for stroke and memory loss.  History is obtained from the patient and his son who is accompanying him who speaks fluent [place] and translates for him.  I have also reviewed electronic medical records and pertinent imaging films in PACS.  He presented on 07/21/2020 after being found unresponsive by family.  EMS on arrival noted him to be pulseless and apneic.  Initial rhythm identified was V. fib and he received a total of 5 shocks and 3 epi injections as well as 450 mg of amiodarone over 15 minutes of CPR before return of spontaneous circulation.  Patient was subsequently found to be agitated and hypertensive upon arrival to the ED and required sedation with Versed.  Admission chemistry was significant for troponin of 2747 and lactic acid of 3.7.  EKG showed ST segment changes in anterior lateral leads and he underwent emergent cardiac catheterization and required left anterior descending coronary artery stenting.  Echocardiogram showed ejection fraction of 30 to 35% with moderately decreased LV function.  Cranial CT scan showed no acute abnormality or cerebral edema.  There were changes of chronic small vessel disease.  He was kept in the ICU and required vasopressors for hypotension and respiratory failure needing ventilatory support.  He developed aspiration pneumonia and right pleural effusion requiring thoracocentesis with removal of 450 cc of fluid.  EEG done on 3 consecutive days showed moderate encephalopathy with 4 to 6 Hz mixed theta and delta slowing but no epileptiform activity was noted.  He was started on aspirin  81 and Brilinta for his coronary stent.  Patient developed altered mental status and requiring MRI scan done on 08/07/2020 which showed tiny punctate left frontal white matter lacunar infarct but no evidence of hypoxic ischemic brain injury.  Patient subsequently was transferred to inpatient rehab and has now been discharged home.  According to the son is doing better his memory seems to have improved though he still has some short-term memory difficulties but these are not getting worse.  He has had no focal deficits from stroke.  He has quit smoking completely.  He is tolerating aspirin and Brilinta with only minor bruising and no bleeding.  His blood pressure is well controlled and today it is 110/76.  He is tolerating Crestor well without muscle aches and pains. ?Update 12/20/2020: He returns for follow-up after last visit 3 months ago.  Accompanied by son who translates for him.  Patient is doing well.  He is continues to have mild short-term memory difficulties with unchanged.  His memory difficulties are not getting any worse.  He is on aspirin and Plavix and tolerating them without any side effects.  His blood pressure is well controlled and today it is 119/76.  He has no new complaints. ?\Update 06/20/2021 ; he returns for follow-up after last visit 6 months ago.  He is accompanied by his daughter.  He states he is doing well.  He has had no stroke or TIA symptoms.  He continues to have mild short-term memory and cognitive difficulties but these appear to be unchanged.  He does not participate in any regular  cognitively challenging activities.  He is pretty independent and manages most of his daily routine by himself.  He lives with family.  He had COVID a few weeks ago and developed pneumonia and was treated with IV antibiotics for a week.  He is much better now without any symptoms.  He remains on aspirin and Plavix which he  is tolerating well without bleeding or bruising.  Blood pressure under good  control.  He is tolerating Crestor well without any side effects.  He has no new complaints today. ?ROS:   ?14 system review of systems is positive for cardiac arrest, loss of consciousness, confusion, memory loss and all other systems negative ? ?PMH:  ?Past Medical History:  ?Diagnosis Date  ? Cardiac arrest [facility])   ? Coronary artery disease   ? Dysrhythmia   ? Myocardial infarction [facility])   ? Stroke [facility])   ? ? ?Social History:  ?Social History  ? ?Socioeconomic History  ? Marital status: Widowed  ?  Spouse name: Not on file  ? Number of children: Not on file  ? Years of education: Not on file  ? Highest education level: Not on file  ?Occupational History  ? Not on file  ?Tobacco Use  ? Smoking status: Former  ?  Packs/day: 1.00  ?  Types: Cigarettes  ? Smokeless tobacco: Never  ?Vaping Use  ? Vaping Use: Unknown  ?Substance and Sexual Activity  ? Alcohol use: Not Currently  ? Drug use: Not Currently  ? Sexual activity: Not on file  ?Other Topics Concern  ? Not on file  ?Social History Narrative  ? Lives with his 4 daughters  ? Right Handed  ? Drinks 3-4 cups caffeine  ? ?Social Determinants of Health  ? ?Financial Resource Strain: Not on file  ?Food Insecurity: Not on file  ?Transportation Needs: Not on file  ?Physical Activity: Not on file  ?Stress: Not on file  ?Social Connections: Not on file  ?Intimate Partner Violence: Not on file  ? ? ?Medications:   ?Current Outpatient Medications on File Prior to Visit  ?Medication Sig Dispense Refill  ? aspirin 81 MG chewable tablet Chew 1 tablet (81 mg total) by mouth daily.    ? carvedilol (COREG) 3.125 MG tablet Take 1 tablet (3.125 mg total) by mouth 2 (two) times daily. 60 tablet 11  ? clopidogrel (PLAVIX) 75 MG tablet Take 1 tablet (75 mg total) by mouth daily. 90 tablet 3  ? dapagliflozin propanediol (FARXIGA) 10 MG TABS tablet Take 1 tablet (10 mg total) by mouth daily before breakfast. 100 tablet 2  ? rosuvastatin (CRESTOR) 40 MG tablet Take 1 tablet (40 mg  total) by mouth daily. 90 tablet 0  ? sacubitril-valsartan (ENTRESTO) 24-26 MG Take 1 tablet by mouth 2 (two) times daily. 60 tablet 11  ? ?No current facility-administered medications on file prior to visit.  ? ? ?Allergies:   ?Allergies  ?Allergen Reactions  ? Beef-Derived Products Other (See Comments)  ?  Patient is Muslim - cannot get ANY beef, pork, or poultry products (food and medications included)  ? Pork-Derived Products Other (See Comments)  ?  Patient is Muslim - cannot get ANY beef, pork, or poultry products (food and medications included)  ? Poultry Meal Other (See Comments)  ?  Patient is Muslim - cannot get ANY beef, pork, or poultry products (food and medications included)  ? ? ?Physical Exam ?General: Frail malnourished looking middle-aged African male, seated, in no evident distress ?Head:  head normocephalic and atraumatic.   ?Neck: supple with no carotid or supraclavicular bruits ?Cardiovascular: regular rate and rhythm, no murmurs ?Musculoskeletal: no deformity ?Skin:  no rash/petichiae ?Vascular:  Normal pulses all extremities ? ?Neurologic Exam ?Mental Status: Awake and fully alert. Oriented to place and time. Recent and remote memory intact. Attention span, concentration and fund of knowledge appropriate. Mood and affect appropriate.  Diminished recall 0/3.   Marland Kitchen  Mini-Mental status exam not done due to language barrier ?Cranial Nerves: Fundoscopic exam not done. Pupils equal, briskly reactive to light. Extraocular movements full without nystagmus. Visual fields full to confrontation. Hearing intact. Facial sensation intact. Face, tongue, palate moves normally and symmetrically.  ?Motor: Normal bulk and tone. Normal strength in all tested extremity muscles. ?Sensory.: intact to touch , pinprick , position and vibratory sensation.  ?Coordination: Rapid alternating movements normal in all extremities. Finger-to-nose and heel-to-shin performed accurately bilaterally. ?Gait and Station: Arises from  chair without difficulty. Stance is normal. Gait demonstrates normal stride length and balance . Able to heel, toe and tandem walk with moderate t difficulty.  ?Reflexes: 1+ and symmetric. Toes downgoing.  ? ?N

## 2021-07-08 ENCOUNTER — Ambulatory Visit (HOSPITAL_COMMUNITY)
Admission: RE | Admit: 2021-07-08 | Discharge: 2021-07-08 | Disposition: A | Discharge status: Home / Self Care | Payer: Medicare Other | Source: Ambulatory Visit | Attending: Internal Medicine | Admitting: Internal Medicine

## 2021-07-08 DIAGNOSIS — I5042 Chronic combined systolic (congestive) and diastolic (congestive) heart failure: Secondary | ICD-10-CM | POA: Diagnosis present

## 2021-07-08 LAB — BASIC METABOLIC PANEL
Anion gap: 7 (ref 5–15)
BUN: 7 mg/dL — ABNORMAL LOW (ref 8–23)
CO2: 23 mmol/L (ref 22–32)
Calcium: 8.6 mg/dL — ABNORMAL LOW (ref 8.9–10.3)
Chloride: 108 mmol/L (ref 98–111)
Creatinine, Ser: 0.92 mg/dL (ref 0.61–1.24)
GFR, Estimated: >60 mL/min (ref >60)
Glucose, Bld: 102 mg/dL — ABNORMAL HIGH (ref 70–99)
Potassium: 3.8 mmol/L (ref 3.5–5.1)
Sodium: 138 mmol/L (ref 135–145)

## 2021-07-17 ENCOUNTER — Ambulatory Visit: Payer: Medicare Other | Admitting: Family Medicine

## 2021-07-25 ENCOUNTER — Ambulatory Visit: Payer: Medicare Other | Admitting: Family Medicine

## 2021-08-13 ENCOUNTER — Telehealth: Payer: Self-pay | Admitting: Neurology

## 2021-08-13 NOTE — Telephone Encounter (Signed)
FYI , pt's daughter has called to report pt will be out of the country the late part of Oct. She has requested the appointment for f/u be before pt leaves.  Pt has been r/s and is on wait list .  There is no request for a call back

## 2021-08-19 ENCOUNTER — Encounter: Payer: Self-pay | Admitting: Family Medicine

## 2021-08-26 ENCOUNTER — Encounter: Payer: Self-pay | Admitting: Family Medicine

## 2021-08-26 ENCOUNTER — Ambulatory Visit (INDEPENDENT_AMBULATORY_CARE_PROVIDER_SITE_OTHER): Payer: Medicare Other | Admitting: Family Medicine

## 2021-08-26 VITALS — BP 116/73 | HR 73 | Temp 98.0°F | Resp 16 | Wt 137.4 lb

## 2021-08-26 DIAGNOSIS — Z0000 Encounter for general adult medical examination without abnormal findings: Secondary | ICD-10-CM | POA: Diagnosis not present

## 2021-08-26 DIAGNOSIS — Z789 Other specified health status: Secondary | ICD-10-CM

## 2021-08-26 DIAGNOSIS — Z130 Encounter for screening for diseases of the blood and blood-forming organs and certain disorders involving the immune mechanism: Secondary | ICD-10-CM

## 2021-08-27 ENCOUNTER — Encounter: Payer: Self-pay | Admitting: Family Medicine

## 2021-08-27 NOTE — Progress Notes (Signed)
Established Patient Office Visit  Subjective    Patient ID: Vincent Horton, male    DOB: [birth year]  Age: 66 y.o. MRN: [id number]  CC:  Chief Complaint  Patient presents with   Annual Exam    HPI Vincent Horton presents for routine annual exam. This visit was aided by an interpreter.    Outpatient Encounter Medications as of 08/26/2021  Medication Sig   aspirin 81 MG chewable tablet Chew by mouth.   carvedilol (COREG) 3.125 MG tablet Take 1 tablet (3.125 mg total) by mouth 2 (two) times daily.   dapagliflozin propanediol (FARXIGA) 10 MG TABS tablet Take 1 tablet (10 mg total) by mouth daily before breakfast.   rosuvastatin (CRESTOR) 40 MG tablet Take 1 tablet (40 mg total) by mouth daily.   sacubitril-valsartan (ENTRESTO) 24-26 MG Take 1 tablet by mouth 2 (two) times daily.   [DISCONTINUED] aspirin 81 MG chewable tablet Chew 1 tablet (81 mg total) by mouth daily.   [DISCONTINUED] clopidogrel (PLAVIX) 75 MG tablet Take 1 tablet (75 mg total) by mouth daily.   clopidogrel (PLAVIX) 75 MG tablet Take 1 tablet by mouth daily.   No facility-administered encounter medications on file as of 08/26/2021.    Past Medical History:  Diagnosis Date   Cardiac arrest [facility]    Coronary artery disease    Dysrhythmia    Myocardial infarction [facility])    Stroke [facility])     Past Surgical History:  Procedure Laterality Date   CARDIAC CATHETERIZATION     CORONARY/GRAFT ACUTE MI REVASCULARIZATION N/A 07/21/2020   Procedure: Coronary/Graft Acute MI Revascularization;  Surgeon: Swaziland, Peter M, MD;  Location: [facility] INVASIVE CV LAB;  Service: Cardiovascular;  Laterality: N/A;   LEFT HEART CATH AND CORONARY ANGIOGRAPHY N/A 07/21/2020   Procedure: LEFT HEART CATH AND CORONARY ANGIOGRAPHY;  Surgeon: [place]azil[place], Peter M, MD;  Location: [facility] INVASIVE CV LAB;  Service: Cardiovascular;  Laterality: N/A;   RIGHT HEART CATH N/A 07/21/2020   Procedure: RIGHT HEART CATH;  Surgeon: [place]azil[place], Peter M, MD;  Location: [facility] INVASIVE CV LAB;   Service: Cardiovascular;  Laterality: N/A;    Family History  Family history unknown: Yes    Social History   Socioeconomic History   Marital status: Widowed    Spouse name: Not on file   Number of children: Not on file   Years of education: Not on file   Highest education level: Not on file  Occupational History   Not on file  Tobacco Use   Smoking status: Former    Packs/day: 1.00    Types: Cigarettes   Smokeless tobacco: Never  Vaping Use   Vaping Use: Unknown  Substance and Sexual Activity   Alcohol use: Not Currently   Drug use: Not Currently   Sexual activity: Not on file  Other Topics Concern   Not on file  Social History Narrative   Lives with his 4 daughters   Right Handed   Drinks 3-4 cups caffeine   Social Determinants of Health   Financial Resource Strain: Not on file  Food Insecurity: Not on file  Transportation Needs: Not on file  Physical Activity: Not on file  Stress: Not on file  Social Connections: Not on file  Intimate Partner Violence: Not on file    Review of Systems  All other systems reviewed and are negative.       Objective    BP 116/73   Pulse 73   Temp 98 F (36.7 C) (Oral)  Resp 16   Wt 137 lb 6.4 oz (62.3 kg)   BMI 23.58 kg/m   Physical Exam Vitals and nursing note reviewed.  Constitutional:      General: He is not in acute distress. HENT:     Head: Normocephalic and atraumatic.     Right Ear: Tympanic membrane, ear canal and external ear normal.     Left Ear: Tympanic membrane, ear canal and external ear normal.     Nose: Nose normal.     Mouth/Throat:     Mouth: Mucous membranes are moist.     Pharynx: Oropharynx is clear.  Eyes:     Conjunctiva/sclera: Conjunctivae normal.     Pupils: Pupils are equal, round, and reactive to light.  Neck:     Thyroid: No thyromegaly.  Cardiovascular:     Rate and Rhythm: Normal rate and regular rhythm.     Heart sounds: Normal heart sounds. No murmur heard. Pulmonary:      Effort: Pulmonary effort is normal.     Breath sounds: Normal breath sounds.  Abdominal:     General: There is no distension.     Palpations: Abdomen is soft. There is no mass.     Tenderness: There is no abdominal tenderness.     Hernia: There is no hernia in the left inguinal area or right inguinal area.  Musculoskeletal:        General: Normal range of motion.     Cervical back: Normal range of motion and neck supple.     Right lower leg: No edema.     Left lower leg: No edema.  Skin:    General: Skin is warm and dry.  Neurological:     General: No focal deficit present.     Mental Status: He is alert and oriented to person, place, and time. Mental status is at baseline.  Psychiatric:        Mood and Affect: Mood normal.        Behavior: Behavior normal.         Assessment & Plan:   1. Annual physical exam Routine labs ordered - CMP14+EGFR  2. Screening for deficiency anemia  - CBC with Differential  3. Screening for endocrine/metabolic/immunity disorders  - TSH - Hemoglobin A1c  4. Language barrier to communication     No follow-ups on file.   [name], MD

## 2021-08-28 LAB — CBC WITH DIFFERENTIAL/PLATELET

## 2021-08-28 LAB — CMP14+EGFR
ALT: 13 IU/L (ref 0–44)
AST: 17 IU/L (ref 0–40)
Albumin/Globulin Ratio: 1.9 (ref 1.2–2.2)
Albumin: 4.5 g/dL (ref 3.8–4.8)
Alkaline Phosphatase: 92 IU/L (ref 44–121)
BUN/Creatinine Ratio: 8 — ABNORMAL LOW (ref 10–24)
BUN: 7 mg/dL — ABNORMAL LOW (ref 8–27)
Bilirubin Total: 0.3 mg/dL (ref 0.0–1.2)
CO2: 19 mmol/L — ABNORMAL LOW (ref 20–29)
Calcium: 8.8 mg/dL (ref 8.6–10.2)
Chloride: 105 mmol/L (ref 96–106)
Creatinine, Ser: 0.91 mg/dL (ref 0.76–1.27)
Globulin, Total: 2.4 g/dL (ref 1.5–4.5)
Glucose: 113 mg/dL — ABNORMAL HIGH (ref 70–99)
Potassium: 4.3 mmol/L (ref 3.5–5.2)
Sodium: 141 mmol/L (ref 134–144)
Total Protein: 6.9 g/dL (ref 6.0–8.5)
eGFR: 92 mL/min/{1.73_m2} (ref >59)

## 2021-08-28 LAB — HEMOGLOBIN A1C
Est. average glucose Bld gHb Est-mCnc: 146 mg/dL
Hgb A1c MFr Bld: 6.7 % — ABNORMAL HIGH (ref 4.8–5.6)

## 2021-08-28 LAB — TSH: TSH: 1.81 u[IU]/mL (ref 0.450–4.500)

## 2021-08-29 ENCOUNTER — Ambulatory Visit: Payer: Self-pay

## 2021-08-29 NOTE — Telephone Encounter (Signed)
Summary: cough    Patients daughter sadia, called in about constant cough patient has had for 2 weeks, and she says it seems to be getting worst and wants to know what she can do until his appt on July 31 at 1:20. Please call back     Called Pt  - lmom to return call.

## 2021-08-29 NOTE — Telephone Encounter (Signed)
Summary: cough   Patients daughter sadia, called in about constant cough patient has had for 2 weeks, and she says it seems to be getting worst and wants to know what she can do until his appt on July 31 at 1:20. Please call back     No [place] interpreters answering the phone.

## 2021-08-29 NOTE — Telephone Encounter (Signed)
Please advise patient.  

## 2021-08-29 NOTE — Telephone Encounter (Signed)
Summary: cough    Patients daughter sadia, called in about constant cough patient has had for 2 weeks, and she says it seems to be getting worst and wants to know what she can do until his appt on July 31 at 1:20. Please call back     Called pt - LMOM

## 2021-08-29 NOTE — Telephone Encounter (Signed)
Summary: cough    Patients daughter sadia, called in about constant cough patient has had for 2 weeks, and she says it seems to be getting worst and wants to know what she can do until his appt on July 31 at 1:20. Please call back     Called pt - LMOM 

## 2021-08-30 ENCOUNTER — Ambulatory Visit
Admission: EM | Admit: 2021-08-30 | Discharge: 2021-08-30 | Disposition: A | Discharge status: Home / Self Care | Payer: Medicare Other | Attending: Internal Medicine | Admitting: Internal Medicine

## 2021-08-30 ENCOUNTER — Ambulatory Visit (INDEPENDENT_AMBULATORY_CARE_PROVIDER_SITE_OTHER): Payer: Medicare Other

## 2021-08-30 DIAGNOSIS — R059 Cough, unspecified: Secondary | ICD-10-CM | POA: Diagnosis not present

## 2021-08-30 DIAGNOSIS — R053 Chronic cough: Secondary | ICD-10-CM | POA: Diagnosis not present

## 2021-08-30 MED ORDER — BENZONATATE 100 MG PO CAPS: 100.0000 mg | ORAL_CAPSULE | Freq: Three times a day (TID) | ORAL | 0 refills | Status: DC | PRN

## 2021-08-30 NOTE — ED Provider Notes (Signed)
[name]    CSN: [id number] Arrival date & time: 08/30/21  1816      History   Chief Complaint Chief Complaint  Patient presents with   Cough    HPI Vincent Horton is a 66 y.o. male.   Patient presents with persistent cough that has been present for approximately 2 weeks.  Reports burning sensation in the left side of his chest when cough occurs at times.  Denies any associated upper respiratory symptoms or fever.  Denies any known sick contacts.  Patient reports associated chills at times.  Denies shortness of breath, sore throat, ear pain, nausea, vomiting, diarrhea, abdominal pain.  Patient does not take any medications for symptoms.  Denies history of asthma or COPD and patient is not a smoker.   Cough   Past Medical History:  Diagnosis Date   Cardiac arrest [facility])    Coronary artery disease    Dysrhythmia    Myocardial infarction [facility])    Stroke [facility])     Patient Active Problem List   Diagnosis Date Noted   Chronic combined systolic and diastolic CHF (congestive heart failure) ([place]) 05/13/2021   History of ST elevation myocardial infarction (STEMI) 09/26/2020   History of pleural effusion 09/26/2020   Mixed hyperlipidemia 09/26/2020   CVA (cerebral vascular accident) ([place]) 08/10/2020   Pleural effusion    Protein-calorie malnutrition, severe 07/25/2020   Acute hypoxemic respiratory failure ([place]) 07/24/2020   Ischemic cardiomyopathy 07/24/2020   Aspiration pneumonia ([place]) 07/24/2020   Pulmonary edema cardiac cause ([place]) 07/24/2020   Acute kidney injury ([place]) 07/24/2020   Shock liver 07/24/2020   Cardiogenic shock ([place]) 07/24/2020   Transaminitis 07/24/2020   Protein calorie malnutrition ([place]) 07/23/2020   Deficiency of macronutrients 07/23/2020   Acute ST elevation myocardial infarction (STEMI) due to occlusion of left anterior descending (LAD) coronary artery ([place]) 07/21/2020   Cardiac arrest ([place]) 07/21/2020   STEMI involving left anterior  descending coronary artery ([place]) 07/21/2020    Past Surgical History:  Procedure Laterality Date   CARDIAC CATHETERIZATION     CORONARY/GRAFT ACUTE MI REVASCULARIZATION N/A 07/21/2020   Procedure: Coronary/Graft Acute MI Revascularization;  Surgeon: [place], Peter M, MD;  Location: [place] CV LAB;  Service: Cardiovascular;  Laterality: N/A;   LEFT HEART CATH AND CORONARY ANGIOGRAPHY N/A 07/21/2020   Procedure: LEFT HEART CATH AND CORONARY ANGIOGRAPHY;  Surgeon: [place], Peter M, MD;  Location: [place] CV LAB;  Service: Cardiovascular;  Laterality: N/A;   RIGHT HEART CATH N/A 07/21/2020   Procedure: RIGHT HEART CATH;  Surgeon: [place], Peter M, MD;  Location: [place] CV LAB;  Service: Cardiovascular;  Laterality: N/A;       Home Medications    Prior to Admission medications   Medication Sig Start Date End Date Taking? Authorizing Provider  aspirin 81 MG chewable tablet Chew by mouth. 08/23/20   [provider]  benzonatate (TESSALON) 100 MG capsule Take 1 capsule (100 mg total) by mouth every 8 (eight) hours as needed for cough. 08/30/21   Teodora Medici, FNP  carvedilol (COREG) 3.125 MG tablet Take 1 tablet (3.125 mg total) by mouth 2 (two) times daily. 12/13/20 12/13/21  Rafael Bihari, FNP  clopidogrel (PLAVIX) 75 MG tablet Take 1 tablet by mouth daily.    [provider]  dapagliflozin propanediol (FARXIGA) 10 MG TABS tablet Take 1 tablet (10 mg total) by mouth daily before breakfast. 04/01/21   [place], Maricela Bo, FNP  rosuvastatin (CRESTOR) 40 MG tablet  Take 1 tablet (40 mg total) by mouth daily. 05/28/21   Georganna Skeans, MD  sacubitril-valsartan (ENTRESTO) 24-26 MG Take 1 tablet by mouth 2 (two) times daily. 06/18/21   Laurey Morale, MD    Family History Family History  Family history unknown: Yes    Social History Social History   Tobacco Use   Smoking status: Former    Packs/day: 1.00    Types: Cigarettes   Smokeless tobacco: Never  Vaping  Use   Vaping Use: Unknown  Substance Use Topics   Alcohol use: Not Currently   Drug use: Not Currently     Allergies   Beef-derived products, Pork-derived products, and Poultry meal   Review of Systems Review of Systems Per HPI  Physical Exam Triage Vital Signs ED Triage Vitals  Enc Vitals Group     BP 08/30/21 1859 115/75     Pulse Rate 08/30/21 1859 70     Resp 08/30/21 1859 18     Temp 08/30/21 1859 98 F (36.7 C)     Temp Source 08/30/21 1859 Oral     SpO2 08/30/21 1859 97 %     Weight --      Height --      Head Circumference --      Peak Flow --      Pain Score 08/30/21 1900 0     Pain Loc --      Pain Edu? --      Excl. in GC? --    No data found.  Updated Vital Signs BP 115/75 (BP Location: Left Arm)   Pulse 70   Temp 98 F (36.7 C) (Oral)   Resp 18   SpO2 97%   Visual Acuity Right Eye Distance:   Left Eye Distance:   Bilateral Distance:    Right Eye Near:   Left Eye Near:    Bilateral Near:     Physical Exam Constitutional:      General: He is not in acute distress.    Appearance: Normal appearance. He is not toxic-appearing or diaphoretic.  HENT:     Head: Normocephalic and atraumatic.     Right Ear: Tympanic membrane and ear canal normal.     Left Ear: Tympanic membrane and ear canal normal.     Nose: No congestion.     Mouth/Throat:     Mouth: Mucous membranes are moist.     Pharynx: No posterior oropharyngeal erythema.  Eyes:     Extraocular Movements: Extraocular movements intact.     Conjunctiva/sclera: Conjunctivae normal.     Pupils: Pupils are equal, round, and reactive to light.  Cardiovascular:     Rate and Rhythm: Normal rate and regular rhythm.     Pulses: Normal pulses.     Heart sounds: Normal heart sounds.  Pulmonary:     Effort: Pulmonary effort is normal. No respiratory distress.     Breath sounds: Normal breath sounds. No stridor. No wheezing, rhonchi or rales.  Abdominal:     General: Abdomen is flat. Bowel  sounds are normal.     Palpations: Abdomen is soft.  Musculoskeletal:        General: Normal range of motion.     Cervical back: Normal range of motion.  Skin:    General: Skin is warm and dry.  Neurological:     General: No focal deficit present.     Mental Status: He is alert and oriented to person, place, and time. Mental status is  at baseline.  Psychiatric:        Mood and Affect: Mood normal.        Behavior: Behavior normal.      UC Treatments / Results  Labs (all labs ordered are listed, but only abnormal results are displayed) Labs Reviewed - No data to display  EKG   Radiology DG Chest 2 View  Result Date: 08/30/2021 CLINICAL DATA:  Cough. EXAM: CHEST - 2 VIEW COMPARISON:  06/05/2021 and older studies. FINDINGS: Cardiac silhouette is normal in size. Normal mediastinal and hilar contours. Clear lungs.  No pleural effusion or pneumothorax. Skeletal structures are intact. IMPRESSION: No active cardiopulmonary disease. Electronically Signed   By: Amie Portland M.D.   On: 08/30/2021 19:26    Procedures Procedures (including critical care time)  Medications Ordered in UC Medications - No data to display  Initial Impression / Assessment and Plan / UC Course  I have reviewed the triage vital signs and the nursing notes.  Pertinent labs & imaging results that were available during my care of the patient were reviewed by me and considered in my medical decision making (see chart for details).     Chest x-ray was negative for any acute cardiopulmonary process.  Patient does have a history of heart failure but given negative chest x-ray, no shortness of breath, no adventitious lung sounds there is no concern for any complications related to this.  Suspect possible acute bronchitis versus viral illness.  Will treat with benzonatate.  Suspect "chest burning sensation" is from inflammation in lungs and not associated with heart.  Therefore, do not think that EKG is necessary  given that pain is located on the right side of the chest.  Will avoid prednisone given latest EF was 45%.  Do not think antibiotics are necessary given that symptoms are most likely viral in nature.  Discussed supportive care with patient and caregiver.  Patient verbalized understanding and was agreeable with plan.  Advised patient to follow-up with PCP as well for further evaluation and management.  Interpreter used throughout patient interaction. Final Clinical Impressions(s) / UC Diagnoses   Final diagnoses:  Persistent cough     Discharge Instructions      Your chest x-ray was normal.  You have prescribed a cough medication to take as needed.  Please follow-up if symptoms persist or worsen.     ED Prescriptions     Medication Sig Dispense Auth. Provider   benzonatate (TESSALON) 100 MG capsule  (Status: Discontinued) Take 1 capsule (100 mg total) by mouth every 8 (eight) hours as needed for cough. 21 capsule [place], Carlton E, [place]   benzonatate (TESSALON) 100 MG capsule Take 1 capsule (100 mg total) by mouth every 8 (eight) hours as needed for cough. 21 capsule [place], Acie Fredrickson, [place]      PDMP not reviewed this encounter.   Gustavus Bryant, [place] 08/30/21 2002

## 2021-08-30 NOTE — Telephone Encounter (Signed)
  Chief Complaint: Cough 4 weeks Symptoms: Cough Frequency: 4 weeks Pertinent Negatives: Patient denies Fever Disposition: [] ED /[x] Urgent Care (no appt availability in office) / [] Appointment(In office/virtual)/ []  [facility]/ [] Home Care/ [] Refused Recommended Disposition /[] [facility]/ []  Follow-up with PCP Additional Notes: PT's daughter returned out call. Daughter was not with pt. PT has had cough for about 4 weeks. Daughter not able to answer all questions, but enough for NT to determine disposition. Reason for Disposition  Cough has been present for > 3 weeks  Answer Assessment - Initial Assessment Questions 1. ONSET: "When did the cough begin?"      May 29 th 2. SEVERITY: "How bad is the cough today?"      Sounds likes a smoker's cough 3. SPUTUM: "Describe the color of your sputum" (none, dry cough; clear, white, yellow, green)     yellow 4. HEMOPTYSIS: "Are you coughing up any blood?" If so ask: "How much?" (flecks, streaks, tablespoons, etc.)     no 5. DIFFICULTY BREATHING: "Are you having difficulty breathing?" If Yes, ask: "How bad is it?" (e.g., mild, moderate, severe)    - MILD: No SOB at rest, mild SOB with walking, speaks normally in sentences, can lie down, no retractions, pulse < 100.    - MODERATE: SOB at rest, SOB with minimal exertion and prefers to sit, cannot lie down flat, speaks in phrases, mild retractions, audible wheezing, pulse 100-120.    - SEVERE: Very SOB at rest, speaks in single words, struggling to breathe, sitting hunched forward, retractions, pulse > 120      When he coughs left side of chest hurts 6. FEVER: "Do you have a fever?" If Yes, ask: "What is your temperature, how was it measured, and when did it start?"     no 7. CARDIAC HISTORY: "Do you have any history of heart disease?" (e.g., heart attack, congestive heart failure)      Heart in may last year 8. LUNG HISTORY: "Do you have any history of lung disease?"  (e.g.,  pulmonary embolus, asthma, emphysema)     no 9. PE RISK FACTORS: "Do you have a history of blood clots?" (or: recent major surgery, recent prolonged travel, bedridden)     no 10. OTHER SYMPTOMS: "Do you have any other symptoms?" (e.g., runny nose, wheezing, chest pain)       no 11. PREGNANCY: "Is there any chance you are pregnant?" "When was your last menstrual period?"       no 12. TRAVEL: "Have you traveled out of the country in the last month?" (e.g., travel history, exposures)       no  Protocols used: Cough - Acute Productive-A-AH

## 2021-08-30 NOTE — ED Triage Notes (Signed)
Pt c/o cough that pt attempted to address with primary care but was told had to choose between cough or annual physical. Pt was seen for physical and came here for cough. Cough x 1 week.

## 2021-08-30 NOTE — Discharge Instructions (Signed)
Your chest x-ray was normal.  You have prescribed a cough medication to take as needed.  Please follow-up if symptoms persist or worsen.

## 2021-09-02 NOTE — Telephone Encounter (Signed)
Please offer appt w/pcp or UC

## 2021-09-04 NOTE — Progress Notes (Signed)
Patient ID: Vincent Horton, male    DOB: [birth year]  MRN: [id number]  CC: Urgent Care Follow-Up  Subjective: Vincent Horton is a 66 y.o. male who presents for urgent care follow-up. He is accompanied by daughter, Vincent Horton.   His concerns today include:  08/30/2021 [facility] per NP note: Chest x-ray was negative for any acute cardiopulmonary process.  Patient does have a history of heart failure but given negative chest x-ray, no shortness of breath, no adventitious lung sounds there is no concern for any complications related to this.  Suspect possible acute bronchitis versus viral illness.  Will treat with benzonatate.  Suspect "chest burning sensation" is from inflammation in lungs and not associated with heart.  Therefore, do not think that EKG is necessary given that pain is located on the right side of the chest.  Will avoid prednisone given latest EF was 45%.  Do not think antibiotics are necessary given that symptoms are most likely viral in nature.  Discussed supportive care with patient and caregiver.  Patient verbalized understanding and was agreeable with plan.  Advised patient to follow-up with PCP as well for further evaluation and management.  Interpreter used throughout patient interaction. Your chest x-ray was normal.  You have prescribed a cough medication to take as needed.  Please follow-up if symptoms persist or worsen.  09/11/2021: Dry cough persisting. Prescribed medications did not help. No shortness of breath. Mild chest discomfort at site of history of stent placement. He is established with Cardiology. Next appointment August 2023. Plans to reach out for advisement prior to appointment. History of smoking 31 years. Quit smoking 1 year ago. No further issues/concerns.   Patient Active Problem List   Diagnosis Date Noted   Chronic combined systolic and diastolic CHF (congestive heart failure) (HCC) 05/13/2021   History of ST elevation myocardial  infarction (STEMI) 09/26/2020   History of pleural effusion 09/26/2020   Mixed hyperlipidemia 09/26/2020   CVA (cerebral vascular accident) (HCC) 08/10/2020   Pleural effusion    Protein-calorie malnutrition, severe 07/25/2020   Acute hypoxemic respiratory failure (HCC) 07/24/2020   Ischemic cardiomyopathy 07/24/2020   Aspiration pneumonia (HCC) 07/24/2020   Pulmonary edema cardiac cause (HCC) 07/24/2020   Acute kidney injury (HCC) 07/24/2020   Shock liver 07/24/2020   Cardiogenic shock (HCC) 07/24/2020   Transaminitis 07/24/2020   Protein calorie malnutrition (HCC) 07/23/2020   Deficiency of macronutrients 07/23/2020   Acute ST elevation myocardial infarction (STEMI) due to occlusion of left anterior descending (LAD) coronary artery (HCC) 07/21/2020   Cardiac arrest (HCC) 07/21/2020   STEMI involving left anterior descending coronary artery (HCC) 07/21/2020     Current Outpatient Medications on File Prior to Visit  Medication Sig Dispense Refill   aspirin 81 MG chewable tablet Chew by mouth.     benzonatate (TESSALON) 100 MG capsule Take 1 capsule (100 mg total) by mouth every 8 (eight) hours as needed for cough. 21 capsule 0   carvedilol (COREG) 3.125 MG tablet Take 1 tablet (3.125 mg total) by mouth 2 (two) times daily. 60 tablet 11   clopidogrel (PLAVIX) 75 MG tablet Take 1 tablet by mouth daily.     dapagliflozin propanediol (FARXIGA) 10 MG TABS tablet Take 1 tablet (10 mg total) by mouth daily before breakfast. 100 tablet 2   rosuvastatin (CRESTOR) 40 MG tablet Take 1 tablet (40 mg total) by mouth daily. 90 tablet 0   sacubitril-valsartan (ENTRESTO) 24-26 MG Take 1 tablet by mouth 2 (  two) times daily. 60 tablet 11   No current facility-administered medications on file prior to visit.    Allergies  Allergen Reactions   Beef-Derived Products Other (See Comments)    Patient is Muslim - cannot get ANY beef, pork, or poultry products (food and medications included)    Pork-Derived Products Other (See Comments)    Patient is Muslim - cannot get ANY beef, pork, or poultry products (food and medications included)   Poultry Meal Other (See Comments)    Patient is Muslim - cannot get ANY beef, pork, or poultry products (food and medications included)    Social History   Socioeconomic History   Marital status: Widowed    Spouse name: Not on file   Number of children: Not on file   Years of education: Not on file   Highest education level: Not on file  Occupational History   Not on file  Tobacco Use   Smoking status: Former    Packs/day: 1.00    Types: Cigarettes    Passive exposure: Past   Smokeless tobacco: Never  Vaping Use   Vaping Use: Unknown  Substance and Sexual Activity   Alcohol use: Not Currently   Drug use: Not Currently   Sexual activity: Not on file  Other Topics Concern   Not on file  Social History Narrative   Lives with his 4 daughters   Right Handed   Drinks 3-4 cups caffeine   Social Determinants of Health   Financial Resource Strain: Not on file  Food Insecurity: Not on file  Transportation Needs: Not on file  Physical Activity: Not on file  Stress: Not on file  Social Connections: Not on file  Intimate Partner Violence: Not on file    Family History  Family history unknown: Yes    Past Surgical History:  Procedure Laterality Date   CARDIAC CATHETERIZATION     CORONARY/GRAFT ACUTE MI REVASCULARIZATION N/A 07/21/2020   Procedure: Coronary/Graft Acute MI Revascularization;  Surgeon: Swaziland, Peter M, MD;  Location: [facility] INVASIVE CV LAB;  Service: Cardiovascular;  Laterality: N/A;   LEFT HEART CATH AND CORONARY ANGIOGRAPHY N/A 07/21/2020   Procedure: LEFT HEART CATH AND CORONARY ANGIOGRAPHY;  Surgeon: Swaziland, Peter M, MD;  Location: [facility] INVASIVE CV LAB;  Service: Cardiovascular;  Laterality: N/A;   RIGHT HEART CATH N/A 07/21/2020   Procedure: RIGHT HEART CATH;  Surgeon: Swaziland, Peter M, MD;  Location: [facility] INVASIVE CV  LAB;  Service: Cardiovascular;  Laterality: N/A;    ROS: Review of Systems Negative except as stated above  PHYSICAL EXAM: BP 113/72 (BP Location: Left Arm, Patient Position: Sitting, Cuff Size: Normal)   Pulse 76   Temp 98.3 F (36.8 C)   Resp 16   Ht 5' 4.02" (1.626 m)   Wt 130 lb (59 kg)   SpO2 96%   BMI 22.30 kg/m   Physical Exam HENT:     Head: Normocephalic and atraumatic.  Eyes:     Extraocular Movements: Extraocular movements intact.     Conjunctiva/sclera: Conjunctivae normal.     Pupils: Pupils are equal, round, and reactive to light.  Cardiovascular:     Rate and Rhythm: Normal rate and regular rhythm.     Pulses: Normal pulses.     Heart sounds: Normal heart sounds.  Pulmonary:     Effort: Pulmonary effort is normal.     Breath sounds: Normal breath sounds.  Musculoskeletal:     Cervical back: Normal range of motion and neck supple.  Neurological:     General: No focal deficit present.     Mental Status: He is alert and oriented to person, place, and time.  Psychiatric:        Mood and Affect: Mood normal.        Behavior: Behavior normal.     ASSESSMENT AND PLAN: 1. Encounter for screening for lung cancer 2. Cough, unspecified type - Lung cancer screening.  - Referral to Pulmonology for further evaluation and management.  - Follow-up with primary provider as scheduled.  - CT CHEST LUNG CA SCREEN LOW DOSE W/O CM; Future - Ambulatory referral to Pulmonology  3. Language barrier - Photographer. Name: Karie Mainland ID#: [id number]   Patient was given the opportunity to ask questions.  Patient verbalized understanding of the plan and was able to repeat key elements of the plan. Patient was given clear instructions to go to Emergency Department or return to medical center if symptoms don't improve, worsen, or new problems develop.The patient verbalized understanding.   Orders Placed This Encounter  Procedures   CT CHEST LUNG CA SCREEN LOW DOSE W/O CM    Ambulatory referral to Pulmonology    Return for Follow-Up or next available as scheduled with Georganna Skeans, MD.  Rema Fendt, NP

## 2021-09-05 ENCOUNTER — Ambulatory Visit: Payer: Medicare Other | Admitting: Family Medicine

## 2021-09-11 ENCOUNTER — Other Ambulatory Visit: Payer: Self-pay | Admitting: Family Medicine

## 2021-09-11 ENCOUNTER — Ambulatory Visit (INDEPENDENT_AMBULATORY_CARE_PROVIDER_SITE_OTHER): Payer: Medicare Other | Admitting: Family

## 2021-09-11 ENCOUNTER — Encounter: Payer: Self-pay | Admitting: Family

## 2021-09-11 VITALS — BP 113/72 | HR 76 | Temp 98.3°F | Resp 16 | Ht 64.02 in | Wt 130.0 lb

## 2021-09-11 DIAGNOSIS — Z122 Encounter for screening for malignant neoplasm of respiratory organs: Secondary | ICD-10-CM

## 2021-09-11 DIAGNOSIS — R059 Cough, unspecified: Secondary | ICD-10-CM

## 2021-09-11 DIAGNOSIS — Z789 Other specified health status: Secondary | ICD-10-CM | POA: Diagnosis not present

## 2021-09-11 NOTE — Progress Notes (Signed)
Pt presents for follow-up states that he has no illness today just wants follow-up

## 2021-09-11 NOTE — Telephone Encounter (Signed)
Medication Refill - Medication: benzonatate (TESSALON) 100 MG capsule  Pt daughter stated pt was seen today at the office and forgot to ask for his refill.   Has the patient contacted their pharmacy? No.  (Agent: If yes, when and what did the pharmacy advise?)  Preferred Pharmacy (with phone number or street name):  [id number] [place], [place] - [phone]  [address] [place] [address]  Phone: [phone] Fax: [phone]  Hours: Not open 24 hours   Has the patient been seen for an appointment in the last year OR does the patient have an upcoming appointment? Yes.    Agent: Please be advised that RX refills may take up to 3 business days. We ask that you follow-up with your pharmacy.

## 2021-09-11 NOTE — Telephone Encounter (Signed)
Requested medication (s) are due for refill today: Yes  Requested medication (s) are on the active medication list: Yes  Last refill:  08/30/21  Future visit scheduled: No  Notes to clinic:  Unable to refill per protocol, last refill by another provider, seen in office today and forgot to ask for refills      Requested Prescriptions  Pending Prescriptions Disp Refills   benzonatate (TESSALON) 100 MG capsule 21 capsule 0    Sig: Take 1 capsule (100 mg total) by mouth every 8 (eight) hours as needed for cough.     Ear, Nose, and Throat:  Antitussives/Expectorants Passed - 09/11/2021 10:42 AM      Passed - Valid encounter within last 12 months    Recent Outpatient Visits           Today Encounter for screening for lung cancer   Primary Care at [facility], Amy J, NP   2 weeks ago Annual physical exam   Primary Care at [facility], MD   7 months ago Diarrhea, unspecified type   Primary Care at [facility], Lauris Poag, MD   11 months ago History of ST elevation myocardial infarction (STEMI)   Primary Care at [facility], MD   11 months ago Acute cystitis with hematuria   Primary Care at [facility], [place], [place]

## 2021-09-17 ENCOUNTER — Other Ambulatory Visit: Payer: Self-pay | Admitting: Cardiovascular Disease

## 2021-09-18 ENCOUNTER — Telehealth (HOSPITAL_COMMUNITY): Payer: Self-pay | Admitting: Internal Medicine

## 2021-09-18 NOTE — Telephone Encounter (Signed)
Pt daughters came into clinic stating they left a couple of messages for triage, Pt is having pain on right side, boil near stint, pt coughing, PCP recommends earlier appt w/clinic, please call [phone].

## 2021-09-19 ENCOUNTER — Ambulatory Visit: Payer: Medicare Other | Admitting: Family Medicine

## 2021-09-19 NOTE — Telephone Encounter (Signed)
Pt has been seen by urgent care and pcp, no indications of cardiac cause for issues, pcp has ordered CT and ref to pulm. Attempted to call pt's daughter and Left message to call back

## 2021-09-19 NOTE — Telephone Encounter (Signed)
Vm had zero messages when I left for the day yesterday. No VM from pt or family received yesterday.

## 2021-09-23 ENCOUNTER — Encounter: Payer: Self-pay | Admitting: Neurology

## 2021-09-23 ENCOUNTER — Ambulatory Visit (INDEPENDENT_AMBULATORY_CARE_PROVIDER_SITE_OTHER): Payer: Medicare Other | Admitting: Neurology

## 2021-09-23 VITALS — BP 110/67 | HR 58 | Ht 64.0 in | Wt 139.0 lb

## 2021-09-23 DIAGNOSIS — G3184 Mild cognitive impairment, so stated: Secondary | ICD-10-CM

## 2021-09-23 DIAGNOSIS — R413 Other amnesia: Secondary | ICD-10-CM | POA: Diagnosis not present

## 2021-09-23 NOTE — Patient Instructions (Signed)
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  He has had no interval stroke or TIA symptoms.  Recommend he continue aspirin and Plavix for his coronary artery disease for stroke prevention and maintain aggressive risk factor modification with strict control of hypertension with blood pressure goal below 130/90, lipids with LDL cholesterol goal below 70 mg percent.  I also encouraged him to increase participation in cognitively challenging activities like solving crossword puzzles, playing bridge and sudoku.  We also discussed memory compensation strategies.  He will return for follow-up in the future only as needed and no schedule appointment was made. Memory Compensation Strategies  Use "WARM" strategy.  W= write it down  A= associate it  R= repeat it  M= make a mental note  2.   You can keep a Glass blower/designer.  Use a 3-ring notebook with sections for the following: calendar, important names and phone numbers,  medications, doctors' names/phone numbers, lists/reminders, and a section to journal what you did  each day.   3.    Use a calendar to write appointments down.  4.    Write yourself a schedule for the day.  This can be placed on the calendar or in a separate section of the Memory Notebook.  Keeping a  regular schedule can help memory.  5.    Use medication organizer with sections for each day or morning/evening pills.  You may need help loading it  6.    Keep a basket, or pegboard by the door.  Place items that you need to take out with you in the basket or on the pegboard.  You may also want to  include a message board for reminders.  7.    Use sticky notes.  Place sticky notes with reminders in a place where the task is performed.  For example: " turn off the  stove" placed by the stove, "lock the door" placed on the door at eye level, " take your medications" on  the bathroom mirror or by the  place where you normally take your medications.  8.    Use alarms/timers.  Use while cooking to remind yourself to check on food or as a reminder to take your medicine, or as a  reminder to make a call, or as a reminder to perform another task, etc.

## 2021-09-23 NOTE — Progress Notes (Signed)
[facility] [address] [place]. [place] [address] [phone]       OFFICE FOLLOW UP VISIT NOTE  [name] Date of Birth:  11/01/1955 Medical Record Number:  [id number]   Referring MD:  Deatra Ina, PA-c  Reason for Referral:  Stroke  HPI: Initial visit 09/13/2020 :Vincent Horton is a 66 year old [place] male seen today for initial office consultation visit for stroke and memory loss.  History is obtained from the patient and his son who is accompanying him who speaks fluent [place] and translates for him.  I have also reviewed electronic medical records and pertinent imaging films in PACS.  He presented on 07/21/2020 after being found unresponsive by family.  EMS on arrival noted him to be pulseless and apneic.  Initial rhythm identified was V. fib and he received a total of 5 shocks and 3 epi injections as well as 450 mg of amiodarone over 15 minutes of CPR before return of spontaneous circulation.  Patient was subsequently found to be agitated and hypertensive upon arrival to the ED and required sedation with Versed.  Admission chemistry was significant for troponin of 2747 and lactic acid of 3.7.  EKG showed ST segment changes in anterior lateral leads and he underwent emergent cardiac catheterization and required left anterior descending coronary artery stenting.  Echocardiogram showed ejection fraction of 30 to 35% with moderately decreased LV function.  Cranial CT scan showed no acute abnormality or cerebral edema.  There were changes of chronic small vessel disease.  He was kept in the ICU and required vasopressors for hypotension and respiratory failure needing ventilatory support.  He developed aspiration pneumonia and right pleural effusion requiring thoracocentesis with removal of 450 cc of fluid.  EEG done on 3 consecutive days showed moderate encephalopathy with 4 to 6 Hz mixed theta and delta slowing but no epileptiform activity was noted.  He was started on aspirin  81 and Brilinta for his coronary stent.  Patient developed altered mental status and requiring MRI scan done on 08/07/2020 which showed tiny punctate left frontal white matter lacunar infarct but no evidence of hypoxic ischemic brain injury.  Patient subsequently was transferred to inpatient rehab and has now been discharged home.  According to the son is doing better his memory seems to have improved though he still has some short-term memory difficulties but these are not getting worse.  He has had no focal deficits from stroke.  He has quit smoking completely.  He is tolerating aspirin and Brilinta with only minor bruising and no bleeding.  His blood pressure is well controlled and today it is 110/76.  He is tolerating Crestor well without muscle aches and pains. Update 12/20/2020: He returns for follow-up after last visit 3 months ago.  Accompanied by son who translates for him.  Patient is doing well.  He is continues to have mild short-term memory difficulties with unchanged.  His memory difficulties are not getting any worse.  He is on aspirin and Plavix and tolerating them without any side effects.  His blood pressure is well controlled and today it is 119/76.  He has no new complaints. \Update 06/20/2021 ; he returns for follow-up after last visit 6 months ago.  He is accompanied by his daughter.  He states he is doing well.  He has had no stroke or TIA symptoms.  He continues to have mild short-term memory and cognitive difficulties but these appear to be unchanged.  He does not participate in any regular  cognitively challenging activities.  He is pretty independent and manages most of his daily routine by himself.  He lives with family.  He had COVID a few weeks ago and developed pneumonia and was treated with IV antibiotics for a week.  He is much better now without any symptoms.  He remains on aspirin and Plavix which he  is tolerating well without bleeding or bruising.  Blood pressure under good  control.  He is tolerating Crestor well without any side effects.  He has no new complaints today.  Update 09/23/2021 ; he returns for follow-up after last visit 3 months ago.  Is accompanied by his daughter who translates for him.  Patient continues to have mild short-term memory difficulties but he feels that it may have subjectively improved.  He has had no recurrent stroke or TIA symptoms.  He is quite independent in all activities of daily living.  He denies any word finding difficulties trouble completing sentences.  There is no delusions or hallucinations.  His has no concerns or complaints today he is tolerating aspirin well without bruising or bleeding.  His blood pressure is well controlled today it is 110/67.  He is tolerating Crestor well without muscle aches and pains.  He keeps regular follow-up with his cardiologist and has his next appointment next month..  He has no new complaints today. ROS:   14 system review of systems is positive for cardiac arrest, loss of consciousness, confusion, memory loss and all other systems negative  PMH:  Past Medical History:  Diagnosis Date   Cardiac arrest [facility])    Coronary artery disease    Dysrhythmia    Myocardial infarction (HCC)    Stroke [facility])     Social History:  Social History   Socioeconomic History   Marital status: Widowed    Spouse name: Not on file   Number of children: Not on file   Years of education: Not on file   Highest education level: Not on file  Occupational History   Not on file  Tobacco Use   Smoking status: Former    Packs/day: 1.00    Types: Cigarettes    Passive exposure: Past   Smokeless tobacco: Never  Vaping Use   Vaping Use: Unknown  Substance and Sexual Activity   Alcohol use: Not Currently   Drug use: Not Currently   Sexual activity: Not on file  Other Topics Concern   Not on file  Social History Narrative   Lives with his 4 daughters   Right Handed   Drinks 3-4 cups caffeine   Social  Determinants of Health   Financial Resource Strain: Not on file  Food Insecurity: Not on file  Transportation Needs: Not on file  Physical Activity: Not on file  Stress: Not on file  Social Connections: Not on file  Intimate Partner Violence: Not on file    Medications:   Current Outpatient Medications on File Prior to Visit  Medication Sig Dispense Refill   aspirin 81 MG chewable tablet Chew by mouth.     benzonatate (TESSALON) 100 MG capsule Take 1 capsule (100 mg total) by mouth every 8 (eight) hours as needed for cough. 21 capsule 0   carvedilol (COREG) 3.125 MG tablet Take 1 tablet (3.125 mg total) by mouth 2 (two) times daily. 60 tablet 11   clopidogrel (PLAVIX) 75 MG tablet TAKE 1 TABLET BY MOUTH EVERY DAY 90 tablet 1   dapagliflozin propanediol (FARXIGA) 10 MG TABS tablet Take 1 tablet (  10 mg total) by mouth daily before breakfast. 100 tablet 2   rosuvastatin (CRESTOR) 40 MG tablet Take 1 tablet (40 mg total) by mouth daily. 90 tablet 0   sacubitril-valsartan (ENTRESTO) 24-26 MG Take 1 tablet by mouth 2 (two) times daily. 60 tablet 11   No current facility-administered medications on file prior to visit.    Allergies:   Allergies  Allergen Reactions   Beef-Derived Products Other (See Comments)    Patient is Muslim - cannot get ANY beef, pork, or poultry products (food and medications included)   Pork-Derived Products Other (See Comments)    Patient is Muslim - cannot get ANY beef, pork, or poultry products (food and medications included)   Poultry Meal Other (See Comments)    Patient is Muslim - cannot get ANY beef, pork, or poultry products (food and medications included)    Physical Exam General: Frail malnourished looking middle-aged African male, seated, in no evident distress Head: head normocephalic and atraumatic.   Neck: supple with no carotid or supraclavicular bruits Cardiovascular: regular rate and rhythm, no murmurs Musculoskeletal: no deformity Skin:  no  rash/petichiae Vascular:  Normal pulses all extremities  Neurologic Exam Mental Status: Awake and fully alert. Oriented to place and time. Recent and remote memory intact. Attention span, concentration and fund of knowledge appropriate. Mood and affect appropriate.  Diminished recall 1/3.  Able to name only 8 animals which can walk on 4 legs.  Clock drawing 3/4.Marland Kitchen  Mini-Mental status exam not done due to language barrier Cranial Nerves: Fundoscopic exam not done. Pupils equal, briskly reactive to light. Extraocular movements full without nystagmus. Visual fields full to confrontation. Hearing intact. Facial sensation intact. Face, tongue, palate moves normally and symmetrically.  Motor: Normal bulk and tone. Normal strength in all tested extremity muscles. Sensory.: intact to touch , pinprick , position and vibratory sensation.  Coordination: Rapid alternating movements normal in all extremities. Finger-to-nose and heel-to-shin performed accurately bilaterally. Gait and Station: Arises from chair without difficulty. Stance is normal. Gait demonstrates normal stride length and balance . Able to heel, toe and tandem walk with moderate t difficulty.  Reflexes: 1+ and symmetric. Toes downgoing.       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  Vascular risk factors of coronary artery disease, smoking, hyperlipidemia.     PLAN:I had a long discussion with the patient and his daughter regarding his memory loss postcardiac arrest which appears to be improving.  He has had no interval stroke or TIA symptoms.  Recommend he continue aspirin and Plavix for his coronary artery disease for stroke prevention and maintain aggressive risk factor modification with strict control of hypertension with blood pressure goal below 130/90, lipids with LDL cholesterol  goal below 70 mg percent.  I also encouraged him to increase participation in cognitively challenging activities like solving crossword puzzles, playing bridge and sudoku.  We also discussed memory compensation strategies.  He will return for follow-up in the future only as needed and no scheduled appointment was made.Greater than 50% time during this 30-minute  visit was spent on counseling and coordination of care about his memory loss following cardiac arrest and silent lacunar stroke and answering questions. Tyauna Lacaze,MD  Note: This document was prepared with digital dictation and possible smart phrase technology. Any transcriptional errors that result from this process are unintentional.

## 2021-09-30 ENCOUNTER — Ambulatory Visit: Payer: Medicare Other | Admitting: Family

## 2021-10-01 ENCOUNTER — Telehealth: Payer: Self-pay | Admitting: *Deleted

## 2021-10-01 NOTE — Telephone Encounter (Signed)
   Pre-operative Risk Assessment    Patient Name: Vincent Horton  DOB: [birth year] MRN: [id number]      Request for Surgical Clearance    Procedure:   CATARACT EXTRACTION BY PE, IOL-LEFT EYE  Date of Surgery:  Clearance TBD                                 Surgeon:  DR. Georges Mouse Surgeon's Group or Practice Name:  [facility] Phone number:  [phone] Fax number:  [phone]   Type of Clearance Requested:   - Medical ;PER CLEARANCE REQUEST THE PT DOES NOT NEED TO HOLD ANY MEDICATIONS INCLUDING BLOOD THINNERS   Type of Anesthesia:   IV SEDATION   Additional requests/questions:    Elpidio Anis   10/01/2021, 12:16 PM

## 2021-10-01 NOTE — Telephone Encounter (Signed)
   Patient Name: Vincent Horton  DOB: 06/10/1955 MRN: [id number]  Primary Cardiologist: Kristeen Miss, MD  Chart reviewed as part of pre-operative protocol coverage. Cataract extractions are recognized in guidelines as low risk surgeries that do not typically require specific preoperative testing or holding of blood thinner therapy. Therefore, given past medical history and time since last visit, based on ACC/AHA guidelines, Vincent Horton would be at acceptable risk for the planned procedure without further cardiovascular testing.   I will route this recommendation to the requesting party via Epic fax function and remove from pre-op pool.  Please call with questions.  Napoleon Form, Leodis Rains, NP 10/01/2021, 12:41 PM

## 2021-10-11 ENCOUNTER — Other Ambulatory Visit (HOSPITAL_COMMUNITY): Payer: Self-pay | Admitting: Family Medicine

## 2021-10-11 ENCOUNTER — Ambulatory Visit
Admission: RE | Admit: 2021-10-11 | Discharge: 2021-10-11 | Disposition: A | Discharge status: Home / Self Care | Payer: Medicare Other | Source: Ambulatory Visit | Attending: Family | Admitting: Family

## 2021-10-11 DIAGNOSIS — R059 Cough, unspecified: Secondary | ICD-10-CM

## 2021-10-11 DIAGNOSIS — Z122 Encounter for screening for malignant neoplasm of respiratory organs: Secondary | ICD-10-CM

## 2021-10-14 ENCOUNTER — Other Ambulatory Visit: Payer: Self-pay | Admitting: Family

## 2021-10-14 ENCOUNTER — Ambulatory Visit (HOSPITAL_COMMUNITY)
Admission: RE | Admit: 2021-10-14 | Discharge: 2021-10-14 | Disposition: A | Discharge status: Home / Self Care | Payer: Medicare Other | Source: Ambulatory Visit | Attending: Family Medicine | Admitting: Family Medicine

## 2021-10-14 ENCOUNTER — Encounter (HOSPITAL_COMMUNITY): Payer: Self-pay

## 2021-10-14 VITALS — BP 108/80 | HR 77 | Wt 141.0 lb

## 2021-10-14 DIAGNOSIS — Z8673 Personal history of transient ischemic attack (TIA), and cerebral infarction without residual deficits: Secondary | ICD-10-CM | POA: Diagnosis not present

## 2021-10-14 DIAGNOSIS — I639 Cerebral infarction, unspecified: Secondary | ICD-10-CM

## 2021-10-14 DIAGNOSIS — Z8616 Personal history of COVID-19: Secondary | ICD-10-CM | POA: Insufficient documentation

## 2021-10-14 DIAGNOSIS — I255 Ischemic cardiomyopathy: Secondary | ICD-10-CM | POA: Diagnosis not present

## 2021-10-14 DIAGNOSIS — Z79899 Other long term (current) drug therapy: Secondary | ICD-10-CM | POA: Diagnosis not present

## 2021-10-14 DIAGNOSIS — I252 Old myocardial infarction: Secondary | ICD-10-CM | POA: Insufficient documentation

## 2021-10-14 DIAGNOSIS — R053 Chronic cough: Secondary | ICD-10-CM

## 2021-10-14 DIAGNOSIS — Z7902 Long term (current) use of antithrombotics/antiplatelets: Secondary | ICD-10-CM | POA: Insufficient documentation

## 2021-10-14 DIAGNOSIS — I2510 Atherosclerotic heart disease of native coronary artery without angina pectoris: Secondary | ICD-10-CM

## 2021-10-14 DIAGNOSIS — J432 Centrilobular emphysema: Secondary | ICD-10-CM

## 2021-10-14 DIAGNOSIS — J449 Chronic obstructive pulmonary disease, unspecified: Secondary | ICD-10-CM

## 2021-10-14 DIAGNOSIS — I5022 Chronic systolic (congestive) heart failure: Secondary | ICD-10-CM | POA: Diagnosis not present

## 2021-10-14 DIAGNOSIS — Z955 Presence of coronary angioplasty implant and graft: Secondary | ICD-10-CM | POA: Diagnosis not present

## 2021-10-14 DIAGNOSIS — Z7984 Long term (current) use of oral hypoglycemic drugs: Secondary | ICD-10-CM | POA: Diagnosis not present

## 2021-10-14 DIAGNOSIS — Z87891 Personal history of nicotine dependence: Secondary | ICD-10-CM

## 2021-10-14 DIAGNOSIS — I5042 Chronic combined systolic (congestive) and diastolic (congestive) heart failure: Secondary | ICD-10-CM

## 2021-10-14 DIAGNOSIS — Z7982 Long term (current) use of aspirin: Secondary | ICD-10-CM | POA: Diagnosis not present

## 2021-10-14 DIAGNOSIS — Z8674 Personal history of sudden cardiac arrest: Secondary | ICD-10-CM | POA: Diagnosis not present

## 2021-10-14 DIAGNOSIS — I700 Atherosclerosis of aorta: Secondary | ICD-10-CM

## 2021-10-14 LAB — BASIC METABOLIC PANEL
Anion gap: 5 (ref 5–15)
BUN: 6 mg/dL — ABNORMAL LOW (ref 8–23)
CO2: 25 mmol/L (ref 22–32)
Calcium: 8.7 mg/dL — ABNORMAL LOW (ref 8.9–10.3)
Chloride: 108 mmol/L (ref 98–111)
Creatinine, Ser: 0.86 mg/dL (ref 0.61–1.24)
GFR, Estimated: >60 mL/min (ref >60)
Glucose, Bld: 135 mg/dL — ABNORMAL HIGH (ref 70–99)
Potassium: 4 mmol/L (ref 3.5–5.1)
Sodium: 138 mmol/L (ref 135–145)

## 2021-10-14 NOTE — Progress Notes (Signed)
Advanced Heart Failure Clinic Note  Primary Care: Dr. Georganna Skeans Primary Cardiologist: Dr. Elease Hashimoto HF Cardiologist: Dr. Shirlee Latch  HPI: Vincent Horton is a 66 y.o.male with a hx of CAD, VF arrest, and systolic CHF/iCM. Patient is [name] and speaks minimal [place].   He was admitted 07/21/20 with Vfib arrest.  He had an emergent heart catheterization which revealed an occluded left anterior descending artery.  This was stented by Dr. [place] and he was started on ASA + Brilinta. Echocardiogram showed EF of 30 to 35% with moderately decreased LV function. He was kept in the ICU and required vasopressors for hypotension and respiratory failure needing ventilatory support.  He developed aspiration pneumonia and right pleural effusion requiring thoracocentesis with removal of 450 cc of fluid. He developed altered mental status and MRI scan showed punctate acute to subacute frontal infarct, but no evidence of hypoxic ischemic brain injury. There was no definite atrial fibrillation inpatient on telemetry.  He was transferred to [facility] for rehab with Zio 14 day monitor, this showed no atrial fibrillation.     Follow up with general cardiology 9/22, he was off his Brilinta for unclear reasons. Plavix and valsartan started this visit.  Echo 3/23, showed EF improved to 45% with apical septal akinesis, normal RV.  Today he returns for HF follow up with a relative who interprets. Overall feeling fine. He walks for exercise and has no dyspnea with this. Denies palpitations, CP, dizziness, edema, or PND/Orthopnea. Appetite ok. No fever or chills. He does not weigh at home. Has not started taking his [place] yet. Planning on going to [place] in October.  ECG (personally reviewed): NSR 70 bpm  Labs (7/22): LDL 40 Labs (4/23): digoxin 0.4, K 4.1, creatinine 1.11, LDL 22, HDL 38 Labs (6/23): K 4.3, creatinine 0.91  PMH: 1. CAD: Anterior STEMI in 5/22 with totally occluded pLAD treated with DES.  The was  complicated by distal embolization with occluded distal LAD.  2. Chronic systolic CHF: Ischemic cardiomyopathy.  Echo (5/22) with EF 30-35%, normal RV.  - Echo (3/23): EF 45%, apical septal akinesis, normal RV.  3. VF arrest in setting of STEMI 4. Dysphagia: Aspiration risk.  5. CVA: Subacute frontal infarct noted 5/22 admission.  Atrial fibrillation has not been visualized.  6. COVID-19 4/23.   Current Outpatient Medications  Medication Sig Dispense Refill   aspirin 81 MG chewable tablet Chew by mouth.     benzonatate (TESSALON) 100 MG capsule Take 1 capsule (100 mg total) by mouth every 8 (eight) hours as needed for cough. 21 capsule 0   carvedilol (COREG) 3.125 MG tablet TAKE 1 TABLET BY MOUTH 2 TIMES DAILY. 180 tablet 3   clopidogrel (PLAVIX) 75 MG tablet TAKE 1 TABLET BY MOUTH EVERY DAY 90 tablet 1   rosuvastatin (CRESTOR) 40 MG tablet Take 1 tablet (40 mg total) by mouth daily. 90 tablet 0   sacubitril-valsartan (ENTRESTO) 24-26 MG Take 1 tablet by mouth 2 (two) times daily. 60 tablet 11   FARXIGA 10 MG TABS tablet TAKE 1 TABLET BY MOUTH DAILY BEFORE BREAKFAST. (Patient not taking: Reported on 10/14/2021) 90 tablet 3   No current facility-administered medications for this encounter.   Allergies  Allergen Reactions   Beef-Derived Products Other (See Comments)    Patient is Muslim - cannot get ANY beef, pork, or poultry products (food and medications included)   Pork-Derived Products Other (See Comments)    Patient is Muslim - cannot get ANY beef, pork, or poultry  products (food and medications included)   Poultry Meal Other (See Comments)    Patient is Muslim - cannot get ANY beef, pork, or poultry products (food and medications included)   Social History   Socioeconomic History   Marital status: Widowed    Spouse name: Not on file   Number of children: Not on file   Years of education: Not on file   Highest education level: Not on file  Occupational History   Not on file   Tobacco Use   Smoking status: Former    Packs/day: 1.00    Types: Cigarettes    Passive exposure: Past   Smokeless tobacco: Never  Vaping Use   Vaping Use: Unknown  Substance and Sexual Activity   Alcohol use: Not Currently   Drug use: Not Currently   Sexual activity: Not on file  Other Topics Concern   Not on file  Social History Narrative   Lives with his 4 daughters   Right Handed   Drinks 3-4 cups caffeine   Social Determinants of Health   Financial Resource Strain: Not on file  Food Insecurity: Not on file  Transportation Needs: Not on file  Physical Activity: Not on file  Stress: Not on file  Social Connections: Not on file  Intimate Partner Violence: Not on file   Family History  Family history unknown: Yes   BP 108/80   Pulse 77   Wt 64 kg (141 lb)   SpO2 97%   BMI 24.20 kg/m   Wt Readings from Last 3 Encounters:  10/14/21 64 kg (141 lb)  09/23/21 63 kg (139 lb)  09/11/21 59 kg (130 lb)   PHYSICAL EXAM: General:  NAD. No resp difficulty HEENT: Normal Neck: Supple. No JVD. Carotids 2+ bilat; no bruits. No lymphadenopathy or thryomegaly appreciated. Cor: PMI nondisplaced. Regular rate & rhythm. No rubs, gallops or murmurs. Lungs: Clear Abdomen: Soft, nontender, nondistended. No hepatosplenomegaly. No bruits or masses. Good bowel sounds. Extremities: No cyanosis, clubbing, rash, edema Neuro: Alert & oriented x 3, cranial nerves grossly intact. Moves all 4 extremities w/o difficulty. Affect pleasant.  ASSESSMENT & PLAN:  1. CAD: S/p anterior STEMI with occluded proximal LAD in 5/22.  Treated with DES but complicated by embolization to distal LAD (procedure completed with occluded distal LAD).  No chest pain. - Continue ASA + Plavix.  At the end of a year can consider stopping ASA, and continue on Plavix 75 daily. - Continue Crestor 40 mg daily. Good lipids 4/23. 2. Chronic systolic heart failure: Ischemic cardiomyopathy. Echo (6/22) with EF 30-35%, no  LV thrombus, LAD territory WMAs, RV normal, IVC normal. Echo in 3/23 with EF improved to 45%, normal RV.  NYHA class I-II, not volume overloaded on exam.  - Restart Farxiga 10 mg daily.  - Continue Coreg 3.125 mg bid.  - Continue Entresto 24/26 bid.  BMET today. - Off dig with improved EF. - He is out of ICD range.  3. History of cardiac arrest: VF arrest associated with STEMI. Patient shocked x 5, 15 min CPR before ROSC.  No further VT.   - Now out of ICD range.  - Continue Coreg.  4. CVA: MRI 6/7 with punctate acute to subacute frontal infarct. No obvious deficits.  - Continue DAPT.  Follow up in 4 months with Dr. Shirlee Latch.  [place], FNP-BC 10/14/2021

## 2021-10-14 NOTE — Patient Instructions (Addendum)
EKG done today.  Labs done today. We will contact you only if your labs are abnormal.  RESTART Farxiga 10mg  (1 tablet) by mouth daily.   No other medication changes were made. Please continue all current medications as prescribed.  Your physician recommends that you schedule a follow-up appointment in: 4 months with Dr. . Please contact our office in November to schedule a December appointment.   If you have any questions or concerns before your next appointment please send January a message through [place] or call our office at [phone].    TO LEAVE A MESSAGE FOR THE NURSE SELECT OPTION 2, PLEASE LEAVE A MESSAGE INCLUDING: YOUR NAME DATE OF BIRTH CALL BACK NUMBER REASON FOR CALL**this is important as we prioritize the call backs  YOU WILL RECEIVE A CALL BACK THE SAME DAY AS LONG AS YOU CALL BEFORE 4:00 PM   Do the following things EVERYDAY: Weigh yourself in the morning before breakfast. Write it down and keep it in a log. Take your medicines as prescribed Eat low salt foods--Limit salt (sodium) to 2000 mg per day.  Stay as active as you can everyday Limit all fluids for the day to less than 2 liters   At the [facility], you and your health needs are our priority. As part of our continuing mission to provide you with exceptional heart care, we have created designated Provider Care Teams. These Care Teams include your primary Cardiologist (physician) and Advanced Practice Providers (APPs- Physician Assistants and Nurse Practitioners) who all work together to provide you with the care you need, when you need it.   You may see any of the following providers on your designated Care Team at your next follow up: Dr [phone] Dr Arvilla Meres, NP Carron Curie, Robbie Lis [place], PharmD   Please be sure to bring in all your medications bottles to every appointment.

## 2021-12-03 ENCOUNTER — Telehealth: Payer: Self-pay | Admitting: Family Medicine

## 2021-12-03 ENCOUNTER — Other Ambulatory Visit: Payer: Self-pay | Admitting: Family Medicine

## 2021-12-03 DIAGNOSIS — I252 Old myocardial infarction: Secondary | ICD-10-CM

## 2021-12-03 NOTE — Telephone Encounter (Signed)
Sent in today 

## 2021-12-03 NOTE — Telephone Encounter (Signed)
Medication Refill - Medication: rosuvastatin (CRESTOR) 40 MG tablet  Has the patient contacted their pharmacy? Yes.   Pt told to contact provider  Preferred Pharmacy (with phone number or street name):  [id number] - [place], [place] Phone:  [phone]  Fax:  [phone]     Has the patient been seen for an appointment in the last year OR does the patient have an upcoming appointment? Yes.    Agent: Please be advised that RX refills may take up to 3 business days. We ask that you follow-up with your pharmacy.

## 2021-12-17 ENCOUNTER — Ambulatory Visit: Payer: Medicare Other | Admitting: Neurology

## 2021-12-30 ENCOUNTER — Ambulatory Visit: Payer: Medicare Other | Admitting: Neurology

## 2022-02-05 ENCOUNTER — Other Ambulatory Visit: Payer: Self-pay | Admitting: Cardiovascular Disease

## 2022-02-13 ENCOUNTER — Ambulatory Visit (HOSPITAL_COMMUNITY)
Admission: RE | Admit: 2022-02-13 | Discharge: 2022-02-13 | Disposition: A | Discharge status: Home / Self Care | Payer: Medicare Other | Source: Ambulatory Visit | Attending: Cardiology | Admitting: Cardiology

## 2022-02-13 ENCOUNTER — Encounter (HOSPITAL_COMMUNITY): Payer: Self-pay | Admitting: Cardiology

## 2022-02-13 VITALS — BP 102/60 | HR 68 | Wt 142.0 lb

## 2022-02-13 DIAGNOSIS — Z87891 Personal history of nicotine dependence: Secondary | ICD-10-CM | POA: Diagnosis not present

## 2022-02-13 DIAGNOSIS — Z955 Presence of coronary angioplasty implant and graft: Secondary | ICD-10-CM | POA: Diagnosis not present

## 2022-02-13 DIAGNOSIS — I5042 Chronic combined systolic (congestive) and diastolic (congestive) heart failure: Secondary | ICD-10-CM

## 2022-02-13 DIAGNOSIS — I5022 Chronic systolic (congestive) heart failure: Secondary | ICD-10-CM | POA: Diagnosis not present

## 2022-02-13 DIAGNOSIS — I255 Ischemic cardiomyopathy: Secondary | ICD-10-CM | POA: Insufficient documentation

## 2022-02-13 DIAGNOSIS — Z7902 Long term (current) use of antithrombotics/antiplatelets: Secondary | ICD-10-CM | POA: Insufficient documentation

## 2022-02-13 DIAGNOSIS — R4182 Altered mental status, unspecified: Secondary | ICD-10-CM | POA: Insufficient documentation

## 2022-02-13 DIAGNOSIS — Z79899 Other long term (current) drug therapy: Secondary | ICD-10-CM | POA: Insufficient documentation

## 2022-02-13 DIAGNOSIS — I252 Old myocardial infarction: Secondary | ICD-10-CM | POA: Diagnosis present

## 2022-02-13 DIAGNOSIS — Z8616 Personal history of COVID-19: Secondary | ICD-10-CM | POA: Insufficient documentation

## 2022-02-13 DIAGNOSIS — Z8674 Personal history of sudden cardiac arrest: Secondary | ICD-10-CM | POA: Diagnosis not present

## 2022-02-13 DIAGNOSIS — I2510 Atherosclerotic heart disease of native coronary artery without angina pectoris: Secondary | ICD-10-CM | POA: Insufficient documentation

## 2022-02-13 DIAGNOSIS — Z8673 Personal history of transient ischemic attack (TIA), and cerebral infarction without residual deficits: Secondary | ICD-10-CM | POA: Insufficient documentation

## 2022-02-13 LAB — LIPID PANEL
Cholesterol: 136 mg/dL (ref 0–200)
HDL: 50 mg/dL (ref >40)
LDL Cholesterol: 67 mg/dL (ref 0–99)
Total CHOL/HDL Ratio: 2.7 RATIO
Triglycerides: 94 mg/dL (ref <150)
VLDL: 19 mg/dL (ref 0–40)

## 2022-02-13 LAB — BASIC METABOLIC PANEL
Anion gap: 7 (ref 5–15)
BUN: 8 mg/dL (ref 8–23)
CO2: 25 mmol/L (ref 22–32)
Calcium: 9.1 mg/dL (ref 8.9–10.3)
Chloride: 106 mmol/L (ref 98–111)
Creatinine, Ser: 0.89 mg/dL (ref 0.61–1.24)
GFR, Estimated: >60 mL/min (ref >60)
Glucose, Bld: 112 mg/dL — ABNORMAL HIGH (ref 70–99)
Potassium: 4.3 mmol/L (ref 3.5–5.1)
Sodium: 138 mmol/L (ref 135–145)

## 2022-02-13 MED ORDER — SPIRONOLACTONE 25 MG PO TABS: 12.5000 mg | ORAL_TABLET | Freq: Every day | ORAL | 3 refills | Status: DC

## 2022-02-13 NOTE — Progress Notes (Addendum)
Advanced Heart Failure Clinic Note  Primary Care: Dr. Georganna Skeans HF Cardiologist: Dr. Shirlee Latch  HPI: Vincent Horton is a 66 y.o.male with a hx of CAD, VF arrest, and systolic CHF/iCM. Patient is Vincent Horton and speaks minimal [place].   He was admitted 07/21/20 with Vfib arrest.  He had an emergent heart catheterization which revealed an occluded left anterior descending artery.  This was stented by Dr. [place] and he was started on ASA + Brilinta. Echocardiogram showed EF of 30 to 35% with moderately decreased LV function. He was kept in the ICU and required vasopressors for hypotension and respiratory failure needing ventilatory support.  He developed aspiration pneumonia and right pleural effusion requiring thoracocentesis with removal of 450 cc of fluid. He developed altered mental status and MRI scan showed punctate acute to subacute frontal infarct, but no evidence of hypoxic ischemic brain injury. There was no definite atrial fibrillation inpatient on telemetry.  He was transferred to [facility] for rehab with Zio 14 day monitor, this showed no atrial fibrillation.     Follow up with general cardiology 9/22, he was off his Brilinta for unclear reasons. Plavix and valsartan started this visit.  Echo 3/23 showed EF improved to 45% with apical septal akinesis, normal RV.  Today he returns for HF follow up with a relative who interprets. He has been doing well in general.  No chest pain.  No significant exertional dyspnea.  No orthopnea/PND.  No lightheadedness or palpitations.   ECG (personally reviewed): NSR, old ASMI  Labs (7/22): LDL 40 Labs (4/23): digoxin 0.4, K 4.1, creatinine 1.11, LDL 22, HDL 38 Labs (6/23): K 4.3, creatinine 0.91 Labs (8/23): K 4, creatinine 0.86, TSH normal  PMH: 1. CAD: Anterior STEMI in 5/22 with totally occluded pLAD treated with DES.  The was complicated by distal embolization with occluded distal LAD.  2. Chronic systolic CHF: Ischemic cardiomyopathy.  Echo (5/22) with  EF 30-35%, normal RV.  - Echo (3/23): EF 45%, apical septal akinesis, normal RV.  3. VF arrest in setting of STEMI 4. Dysphagia: Aspiration risk.  5. CVA: Subacute frontal infarct noted 5/22 admission.  Atrial fibrillation has not been visualized.  6. COVID-19 4/23.   Current Outpatient Medications  Medication Sig Dispense Refill   benzonatate (TESSALON) 100 MG capsule Take 1 capsule (100 mg total) by mouth every 8 (eight) hours as needed for cough. 21 capsule 0   carvedilol (COREG) 3.125 MG tablet TAKE 1 TABLET BY MOUTH 2 TIMES DAILY. 180 tablet 3   clopidogrel (PLAVIX) 75 MG tablet TAKE 1 TABLET BY MOUTH EVERY DAY 90 tablet 0   FARXIGA 10 MG TABS tablet TAKE 1 TABLET BY MOUTH DAILY BEFORE BREAKFAST. 90 tablet 3   rosuvastatin (CRESTOR) 40 MG tablet TAKE 1 TABLET BY MOUTH EVERY DAY 90 tablet 3   sacubitril-valsartan (ENTRESTO) 24-26 MG Take 1 tablet by mouth 2 (two) times daily. 60 tablet 11   spironolactone (ALDACTONE) 25 MG tablet Take 0.5 tablets (12.5 mg total) by mouth daily. 45 tablet 3   No current facility-administered medications for this encounter.   Allergies  Allergen Reactions   Beef-Derived Products Other (See Comments)    Patient is Muslim - cannot get ANY beef, pork, or poultry products (food and medications included)   Pork-Derived Products Other (See Comments)    Patient is Muslim - cannot get ANY beef, pork, or poultry products (food and medications included)   Poultry Meal Other (See Comments)    Patient is Muslim -  cannot get ANY beef, pork, or poultry products (food and medications included)   Social History   Socioeconomic History   Marital status: Widowed    Spouse name: Not on file   Number of children: Not on file   Years of education: Not on file   Highest education level: Not on file  Occupational History   Not on file  Tobacco Use   Smoking status: Former    Packs/day: 1.00    Types: Cigarettes    Passive exposure: Past   Smokeless tobacco:  Never  Vaping Use   Vaping Use: Unknown  Substance and Sexual Activity   Alcohol use: Not Currently   Drug use: Not Currently   Sexual activity: Not on file  Other Topics Concern   Not on file  Social History Narrative   Lives with his 4 daughters   Right Handed   Drinks 3-4 cups caffeine   Social Determinants of Health   Financial Resource Strain: Not on file  Food Insecurity: Not on file  Transportation Needs: Not on file  Physical Activity: Not on file  Stress: Not on file  Social Connections: Not on file  Intimate Partner Violence: Not on file   Family History  Family history unknown: Yes   BP 102/60   Pulse 68   Wt 64.4 kg (142 lb)   SpO2 99%   BMI 24.37 kg/m   Wt Readings from Last 3 Encounters:  02/13/22 64.4 kg (142 lb)  10/14/21 64 kg (141 lb)  09/23/21 63 kg (139 lb)   PHYSICAL EXAM: General: NAD Neck: No JVD, no thyromegaly or thyroid nodule.  Lungs: Clear to auscultation bilaterally with normal respiratory effort. CV: Nondisplaced PMI.  Heart regular S1/S2, no S3/S4, no murmur.  No peripheral edema.  No carotid bruit.  Normal pedal pulses.  Abdomen: Soft, nontender, no hepatosplenomegaly, no distention.  Skin: Intact without lesions or rashes.  Neurologic: Alert and oriented x 3.  Psych: Normal affect. Extremities: No clubbing or cyanosis.  HEENT: Normal.   ASSESSMENT & PLAN:  1. CAD: S/p anterior STEMI with occluded proximal LAD in 5/22.  Treated with DES but complicated by embolization to distal LAD (procedure completed with occluded distal LAD).  No chest pain. - Continue Plavix.  He is > 1 year post-PCI, think he can stop ASA.  - Continue Crestor 40 mg daily. Good lipids 4/23. 2. Chronic systolic heart failure: Ischemic cardiomyopathy. Echo (6/22) with EF 30-35%, no LV thrombus, LAD territory WMAs, RV normal, IVC normal. Echo in 3/23 with EF improved to 45%, normal RV.  NYHA class I-II, not volume overloaded on exam.  - Continiue Farxiga 10 mg  daily.  - Continue Coreg 3.125 mg bid.  - Continue Entresto 24/26 bid.   - Start spironolactone 12.5 mg daily with BMET today and again in 10 days.  - He is out of ICD range.  3. History of cardiac arrest: VF arrest associated with STEMI. Patient shocked x 5, 15 min CPR before ROSC.  No further VT.   - Now out of ICD range.  - Continue Coreg.  4. CVA: MRI 6/7 with punctate acute to subacute frontal infarct. No obvious deficits.  - Continue DAPT.  Follow up in 4 months with APP  Marca Ancona,  02/13/2022

## 2022-02-13 NOTE — Patient Instructions (Signed)
STOP Asprin.  START Spironolactone 12.5 mg (1/2 Tab) daily.  Labs done today, your results will be available in MyChart, we will contact you for abnormal readings.  Repeat blood work in 10 days  Your physician recommends that you schedule a follow-up appointment in: 4 months   If you have any questions or concerns before your next appointment please send [place] a message through [place] or call our office at [phone].    TO LEAVE A MESSAGE FOR THE NURSE SELECT OPTION 2, PLEASE LEAVE A MESSAGE INCLUDING: YOUR NAME DATE OF BIRTH CALL BACK NUMBER REASON FOR CALL**this is important as we prioritize the call backs  YOU WILL RECEIVE A CALL BACK THE SAME DAY AS LONG AS YOU CALL BEFORE 4:00 PM  At the [facility] Clinic, you and your health needs are our priority. As part of our continuing mission to provide you with exceptional heart care, we have created designated Provider Care Teams. These Care Teams include your primary Cardiologist (physician) and Advanced Practice Providers (APPs- Physician Assistants and Nurse Practitioners) who all work together to provide you with the care you need, when you need it.   You may see any of the following providers on your designated Care Team at your next follow up: Dr Arvilla Meres Dr Marca Ancona Dr. Marcos Eke, NP Robbie Lis, [place] [facility] [place], [place] Brynda Peon, NP Karle Plumber, PharmD   Please be sure to bring in all your medications bottles to every appointment.

## 2022-02-25 ENCOUNTER — Other Ambulatory Visit (HOSPITAL_COMMUNITY): Payer: Medicare Other

## 2022-05-13 ENCOUNTER — Ambulatory Visit
Admission: EM | Admit: 2022-05-13 | Discharge: 2022-05-13 | Disposition: A | Discharge status: Home / Self Care | Payer: 59 | Attending: Family Medicine | Admitting: Family Medicine

## 2022-05-13 DIAGNOSIS — J069 Acute upper respiratory infection, unspecified: Secondary | ICD-10-CM | POA: Diagnosis not present

## 2022-05-13 MED ORDER — BENZONATATE 100 MG PO CAPS: 100.0000 mg | ORAL_CAPSULE | Freq: Three times a day (TID) | ORAL | 0 refills | Status: DC | PRN

## 2022-05-13 NOTE — Discharge Instructions (Signed)
Take benzonatate 100 mg, 1 tab every 8 hours as needed for cough.   You have been swabbed for COVID, and the test will result in the next 24 hours. Our staff will call you if positive. If the COVID test is positive, you should quarantine until you are fever free for 24 hours and you are starting to feel better

## 2022-05-13 NOTE — ED Provider Notes (Signed)
[name]    CSN: [id number] Arrival date & time: 05/13/22  0907      History   Chief Complaint Chief Complaint  Patient presents with   Cough    HPI Vincent Horton is a 67 y.o. male.    Cough  Here for cough and congestion.  His throat hurts when he coughs.  No fever noted, but he has had some chills at night.  He has not take any medication this morning for his symptoms.  No nausea or vomiting or diarrhea.  Past Medical History:  Diagnosis Date   Cardiac arrest [facility])    Coronary artery disease    Dysrhythmia    Myocardial infarction [facility]    Stroke [facility])     Patient Active Problem List   Diagnosis Date Noted   Chronic combined systolic and diastolic CHF (congestive heart failure) ([place]) 05/13/2021   History of ST elevation myocardial infarction (STEMI) 09/26/2020   History of pleural effusion 09/26/2020   Mixed hyperlipidemia 09/26/2020   CVA (cerebral vascular accident) ([place]) 08/10/2020   Pleural effusion    Protein-calorie malnutrition, severe 07/25/2020   Acute hypoxemic respiratory failure ([place]) 07/24/2020   Ischemic cardiomyopathy 07/24/2020   Aspiration pneumonia ([place]) 07/24/2020   Pulmonary edema cardiac cause ([place]) 07/24/2020   Acute kidney injury ([place]) 07/24/2020   Shock liver 07/24/2020   Cardiogenic shock ([place]) 07/24/2020   Transaminitis 07/24/2020   Protein calorie malnutrition ([place]) 07/23/2020   Deficiency of macronutrients 07/23/2020   Acute ST elevation myocardial infarction (STEMI) due to occlusion of left anterior descending (LAD) coronary artery ([place]) 07/21/2020   Cardiac arrest ([place]) 07/21/2020   STEMI involving left anterior descending coronary artery ([place]) 07/21/2020    Past Surgical History:  Procedure Laterality Date   CARDIAC CATHETERIZATION     CORONARY/GRAFT ACUTE MI REVASCULARIZATION N/A 07/21/2020   Procedure: Coronary/Graft Acute MI Revascularization;  Surgeon: Martinique, Peter M, MD;  Location: [place] [facility];   Service: Cardiovascular;  Laterality: N/A;   LEFT HEART CATH AND CORONARY ANGIOGRAPHY N/A 07/21/2020   Procedure: LEFT HEART CATH AND CORONARY ANGIOGRAPHY;  Surgeon: Martinique, Peter M, MD;  Location: [place] [facility];  Service: Cardiovascular;  Laterality: N/A;   RIGHT HEART CATH N/A 07/21/2020   Procedure: RIGHT HEART CATH;  Surgeon: Martinique, Peter M, MD;  Location: [place] [facility];  Service: Cardiovascular;  Laterality: N/A;       Home Medications    Prior to Admission medications   Medication Sig Start Date End Date Taking? Authorizing Provider  benzonatate (TESSALON) 100 MG capsule Take 1 capsule (100 mg total) by mouth 3 (three) times daily as needed for cough. 05/13/22  Yes Barrett Henle, MD  carvedilol (COREG) 3.125 MG tablet TAKE 1 TABLET BY MOUTH 2 TIMES DAILY. 10/11/21   Rafael Bihari, FNP  clopidogrel (PLAVIX) 75 MG tablet TAKE 1 TABLET BY MOUTH EVERY DAY 02/06/22   Nahser, Wonda Cheng, MD  FARXIGA 10 MG TABS tablet TAKE 1 TABLET BY MOUTH DAILY BEFORE BREAKFAST. 10/11/21   Rafael Bihari, FNP  rosuvastatin (CRESTOR) 40 MG tablet TAKE 1 TABLET BY MOUTH EVERY DAY 12/03/21   Larey Dresser, MD  sacubitril-valsartan (ENTRESTO) 24-26 MG Take 1 tablet by mouth 2 (two) times daily. 06/18/21   Larey Dresser, MD  spironolactone (ALDACTONE) 25 MG tablet Take 0.5 tablets (12.5 mg total) by mouth daily. 02/13/22   Larey Dresser, MD    Family History Family History  Family history  unknown: Yes    Social History Social History   Tobacco Use   Smoking status: Former    Packs/day: 1.00    Types: Cigarettes    Passive exposure: Past   Smokeless tobacco: Never  Vaping Use   Vaping Use: Unknown  Substance Use Topics   Alcohol use: Not Currently   Drug use: Not Currently     Allergies   Beef-derived products, Pork-derived products, and Poultry meal   Review of Systems Review of Systems  Respiratory:  Positive for cough.      Physical Exam Triage Vital  Signs ED Triage Vitals  Enc Vitals Group     BP 05/13/22 1059 109/76     Pulse Rate 05/13/22 1058 86     Resp 05/13/22 1058 17     Temp 05/13/22 1058 98.2 F (36.8 C)     Temp Source 05/13/22 1058 Oral     SpO2 05/13/22 1058 96 %     Weight --      Height --      Head Circumference --      Peak Flow --      Pain Score 05/13/22 1057 5     Pain Loc --      Pain Edu? --      Excl. in [place]? --    No data found.  Updated Vital Signs BP 109/76   Pulse 86   Temp 98.2 F (36.8 C) (Oral)   Resp 17   SpO2 96%   Visual Acuity Right Eye Distance:   Left Eye Distance:   Bilateral Distance:    Right Eye Near:   Left Eye Near:    Bilateral Near:     Physical Exam Vitals reviewed.  Constitutional:      General: He is not in acute distress.    Appearance: He is not ill-appearing, toxic-appearing or diaphoretic.  HENT:     Right Ear: Tympanic membrane and ear canal normal.     Left Ear: Tympanic membrane and ear canal normal.     Nose: Congestion present.     Mouth/Throat:     Mouth: Mucous membranes are moist.     Comments: There is clear and white mucus draining in the oropharynx.  Mild erythema is present of the tonsillar pillars Eyes:     Extraocular Movements: Extraocular movements intact.     Conjunctiva/sclera: Conjunctivae normal.     Pupils: Pupils are equal, round, and reactive to light.  Cardiovascular:     Rate and Rhythm: Normal rate and regular rhythm.     Heart sounds: No murmur heard. Pulmonary:     Effort: No respiratory distress.     Breath sounds: No stridor. No wheezing, rhonchi or rales.  Musculoskeletal:     Cervical back: Neck supple.  Lymphadenopathy:     Cervical: No cervical adenopathy.  Skin:    Capillary Refill: Capillary refill takes less than 2 seconds.     Coloration: Skin is not jaundiced or pale.  Neurological:     General: No focal deficit present.     Mental Status: He is alert and oriented to person, place, and time.  Psychiatric:         Behavior: Behavior normal.      UC Treatments / Results  Labs (all labs ordered are listed, but only abnormal results are displayed) Labs Reviewed - No data to display  EKG   Radiology No results found.  Procedures Procedures (including critical care time)  Medications  Ordered in UC Medications - No data to display  Initial Impression / Assessment and Plan / UC Course  I have reviewed the triage vital signs and the nursing notes.  Pertinent labs & imaging results that were available during my care of the patient were reviewed by me and considered in my medical decision making (see chart for details).        Tessalon Perles are sent in for the cough.  COVID swab is done and if positive he is a candidate for Molnupiravir. There is an interaction with paxlovid and plavix. Final Clinical Impressions(s) / UC Diagnoses   Final diagnoses:  Viral URI with cough     Discharge Instructions      Take benzonatate 100 mg, 1 tab every 8 hours as needed for cough.   You have been swabbed for COVID, and the test will result in the next 24 hours. Our staff will call you if positive. If the COVID test is positive, you should quarantine until you are fever free for 24 hours and you are starting to feel better     ED Prescriptions     Medication Sig Dispense Auth. Provider   benzonatate (TESSALON) 100 MG capsule Take 1 capsule (100 mg total) by mouth 3 (three) times daily as needed for cough. 21 capsule Barrett Henle, MD      PDMP not reviewed this encounter.   Barrett Henle, MD 05/13/22 1120

## 2022-05-13 NOTE — ED Triage Notes (Signed)
Pt presents with non productive cough X 2 days that is causing discomfort in throat.

## 2022-06-12 ENCOUNTER — Other Ambulatory Visit (HOSPITAL_COMMUNITY): Payer: Self-pay | Admitting: Cardiology

## 2022-06-16 ENCOUNTER — Telehealth (HOSPITAL_COMMUNITY): Payer: Self-pay

## 2022-06-16 ENCOUNTER — Encounter (HOSPITAL_COMMUNITY): Payer: Self-pay

## 2022-06-16 ENCOUNTER — Ambulatory Visit (HOSPITAL_COMMUNITY)
Admission: RE | Admit: 2022-06-16 | Discharge: 2022-06-16 | Disposition: A | Discharge status: Home / Self Care | Payer: 59 | Source: Ambulatory Visit | Attending: Family Medicine | Admitting: Family Medicine

## 2022-06-16 VITALS — BP 94/72 | HR 79 | Wt 147.2 lb

## 2022-06-16 DIAGNOSIS — I2510 Atherosclerotic heart disease of native coronary artery without angina pectoris: Secondary | ICD-10-CM | POA: Diagnosis present

## 2022-06-16 DIAGNOSIS — Z79899 Other long term (current) drug therapy: Secondary | ICD-10-CM | POA: Diagnosis not present

## 2022-06-16 DIAGNOSIS — Z8674 Personal history of sudden cardiac arrest: Secondary | ICD-10-CM | POA: Diagnosis not present

## 2022-06-16 DIAGNOSIS — Z7902 Long term (current) use of antithrombotics/antiplatelets: Secondary | ICD-10-CM | POA: Insufficient documentation

## 2022-06-16 DIAGNOSIS — Z8673 Personal history of transient ischemic attack (TIA), and cerebral infarction without residual deficits: Secondary | ICD-10-CM | POA: Diagnosis not present

## 2022-06-16 DIAGNOSIS — I5022 Chronic systolic (congestive) heart failure: Secondary | ICD-10-CM | POA: Diagnosis not present

## 2022-06-16 DIAGNOSIS — I639 Cerebral infarction, unspecified: Secondary | ICD-10-CM | POA: Diagnosis not present

## 2022-06-16 DIAGNOSIS — I255 Ischemic cardiomyopathy: Secondary | ICD-10-CM | POA: Insufficient documentation

## 2022-06-16 DIAGNOSIS — Z955 Presence of coronary angioplasty implant and graft: Secondary | ICD-10-CM | POA: Insufficient documentation

## 2022-06-16 DIAGNOSIS — I252 Old myocardial infarction: Secondary | ICD-10-CM | POA: Insufficient documentation

## 2022-06-16 LAB — LIPID PANEL
Cholesterol: 165 mg/dL (ref 0–200)
HDL: 40 mg/dL — ABNORMAL LOW (ref >40)
LDL Cholesterol: 83 mg/dL (ref 0–99)
Total CHOL/HDL Ratio: 4.1 RATIO
Triglycerides: 211 mg/dL — ABNORMAL HIGH (ref <150)
VLDL: 42 mg/dL — ABNORMAL HIGH (ref 0–40)

## 2022-06-16 LAB — BASIC METABOLIC PANEL
Anion gap: 5 (ref 5–15)
BUN: 7 mg/dL — ABNORMAL LOW (ref 8–23)
CO2: 25 mmol/L (ref 22–32)
Calcium: 8.7 mg/dL — ABNORMAL LOW (ref 8.9–10.3)
Chloride: 106 mmol/L (ref 98–111)
Creatinine, Ser: 0.87 mg/dL (ref 0.61–1.24)
GFR, Estimated: >60 mL/min (ref >60)
Glucose, Bld: 131 mg/dL — ABNORMAL HIGH (ref 70–99)
Potassium: 4 mmol/L (ref 3.5–5.1)
Sodium: 136 mmol/L (ref 135–145)

## 2022-06-16 NOTE — Patient Instructions (Signed)
It was great to see you today! No medication changes are needed at this time.  Labs today We will only contact you if something comes back abnormal or we need to make some changes. Otherwise no news is good news!  Your physician wants you to follow-up in: 4 months with Dr Vincent Horton and echo. You will receive a reminder letter in the mail two months in advance. If you don't receive a letter, please call our office to schedule the follow-up appointment.   Your physician has requested that you have an echocardiogram. Echocardiography is a painless test that uses sound waves to create images of your heart. It provides your doctor with information about the size and shape of your heart and how well your heart's chambers and valves are working. This procedure takes approximately one hour. There are no restrictions for this procedure. Please do NOT wear cologne, perfume, aftershave, or lotions (deodorant is allowed). Please arrive 15 minutes prior to your appointment time.  Do the following things EVERYDAY: Weigh yourself in the morning before breakfast. Write it down and keep it in a log. Take your medicines as prescribed Eat low salt foods--Limit salt (sodium) to 2000 mg per day.  Stay as active as you can everyday Limit all fluids for the day to less than 2 liters  At the [facility], you and your health needs are our priority. As part of our continuing mission to provide you with exceptional heart care, we have created designated Provider Care Teams. These Care Teams include your primary Cardiologist (physician) and Advanced Practice Providers (APPs- Physician Assistants and Nurse Practitioners) who all work together to provide you with the care you need, when you need it.   You may see any of the following providers on your designated Care Team at your next follow up: Dr Arvilla Meres Dr Marca Ancona Dr. Marcos Eke, NP Robbie Lis, [place] [facility] [place], [place] Brynda Peon, NP Karle Plumber, PharmD   Please be sure to bring in all your medications bottles to every appointment.    Thank you for choosing [facility]

## 2022-06-16 NOTE — Progress Notes (Addendum)
Advanced Heart Failure Clinic Note  PCP: Dr. Georganna Skeans HF Cardiologist: Dr. Shirlee Latch  HPI: Vincent Horton is a 67 y.o.male with a hx of CAD, VF arrest, and systolic CHF/iCM. Patient is [name] and speaks minimal [place].   He was admitted 07/21/20 with Vfib arrest.  He had an emergent heart catheterization which revealed an occluded left anterior descending artery.  This was stented by Dr. Swaziland and he was started on ASA + Brilinta. Echocardiogram showed EF of 30 to 35% with moderately decreased LV function. He was kept in the ICU and required vasopressors for hypotension and respiratory failure needing ventilatory support.  He developed aspiration pneumonia and right pleural effusion requiring thoracocentesis with removal of 450 cc of fluid. He developed altered mental status and MRI scan showed punctate acute to subacute frontal infarct, but no evidence of hypoxic ischemic brain injury. There was no definite atrial fibrillation inpatient on telemetry.  He was transferred to [facility] for rehab with Zio 14 day monitor, this showed no atrial fibrillation.     Follow up with general cardiology 9/22, he was off his Brilinta for unclear reasons. Plavix and valsartan started this visit.  Echo 3/23 showed EF improved to 45% with apical septal akinesis, normal RV.  Follow up 12/23, NYHA I-II and volume stable. Spiro 12.5 started, > 1 year post-PCI and ASA stopped.  Today he returns for HF follow up, with his son who is interpreting. Overall feeling fine. He is not short of breath with activity, or lifting. He has rare atypical chest pain, upper sternum, tender to palpation. Denies palpitations, dizziness, edema, or PND/Orthopnea. Appetite ok. No fever or chills. He does not weigh at home. Taking all medications. Asking about stopping some of his medications.  ECG (personally reviewed): NSR 79 bpm, old Anterior MI  Labs (7/22): LDL 40 Labs (4/23): digoxin 0.4, K 4.1, creatinine 1.11, LDL 22, HDL 38 Labs  (6/23): K 4.3, creatinine 0.91 Labs (8/23): K 4, creatinine 0.86, TSH normal Labs (12/23): K 4.3, creatinine 0.89, LDL 67  PMH: 1. CAD: Anterior STEMI in 5/22 with totally occluded pLAD treated with DES.  The was complicated by distal embolization with occluded distal LAD.  2. Chronic systolic CHF: Ischemic cardiomyopathy.  Echo (5/22) with EF 30-35%, normal RV.  - Echo (3/23): EF 45%, apical septal akinesis, normal RV.  3. VF arrest in setting of STEMI 4. Dysphagia: Aspiration risk.  5. CVA: Subacute frontal infarct noted 5/22 admission.  Atrial fibrillation has not been visualized.  6. COVID-19 4/23.   Current Outpatient Medications  Medication Sig Dispense Refill   benzonatate (TESSALON) 100 MG capsule Take 1 capsule (100 mg total) by mouth 3 (three) times daily as needed for cough. 21 capsule 0   carvedilol (COREG) 3.125 MG tablet TAKE 1 TABLET BY MOUTH 2 TIMES DAILY. 180 tablet 3   clopidogrel (PLAVIX) 75 MG tablet TAKE 1 TABLET BY MOUTH EVERY DAY 90 tablet 0   ENTRESTO 24-26 MG TAKE 1 TABLET BY MOUTH TWICE A DAY 60 tablet 11   FARXIGA 10 MG TABS tablet TAKE 1 TABLET BY MOUTH DAILY BEFORE BREAKFAST. 90 tablet 3   rosuvastatin (CRESTOR) 40 MG tablet TAKE 1 TABLET BY MOUTH EVERY DAY 90 tablet 3   spironolactone (ALDACTONE) 25 MG tablet Take 0.5 tablets (12.5 mg total) by mouth daily. 45 tablet 3   No current facility-administered medications for this encounter.   Allergies  Allergen Reactions   Beef-Derived Products Other (See Comments)    Patient  is Muslim - cannot get ANY beef, pork, or poultry products (food and medications included)   Pork-Derived Products Other (See Comments)    Patient is Muslim - cannot get ANY beef, pork, or poultry products (food and medications included)   Poultry Meal Other (See Comments)    Patient is Muslim - cannot get ANY beef, pork, or poultry products (food and medications included)   Social History   Socioeconomic History   Marital status:  Widowed    Spouse name: Not on file   Number of children: Not on file   Years of education: Not on file   Highest education level: Not on file  Occupational History   Not on file  Tobacco Use   Smoking status: Former    Packs/day: 1    Types: Cigarettes    Passive exposure: Past   Smokeless tobacco: Never  Vaping Use   Vaping Use: Unknown  Substance and Sexual Activity   Alcohol use: Not Currently   Drug use: Not Currently   Sexual activity: Not on file  Other Topics Concern   Not on file  Social History Narrative   Lives with his 4 daughters   Right Handed   Drinks 3-4 cups caffeine   Social Determinants of Health   Financial Resource Strain: Not on file  Food Insecurity: Not on file  Transportation Needs: Not on file  Physical Activity: Not on file  Stress: Not on file  Social Connections: Not on file  Intimate Partner Violence: Not on file   Family History  Family history unknown: Yes   BP 94/72   Pulse 79   Wt 66.8 kg (147 lb 3.2 oz)   SpO2 97%   BMI 25.27 kg/m   Wt Readings from Last 3 Encounters:  06/16/22 66.8 kg (147 lb 3.2 oz)  02/13/22 64.4 kg (142 lb)  10/14/21 64 kg (141 lb)   PHYSICAL EXAM: General:  NAD. No resp difficulty, walked into clinic HEENT: Normal Neck: Supple. No JVD. Carotids 2+ bilat; no bruits. No lymphadenopathy or thryomegaly appreciated. Cor: PMI nondisplaced. Regular rate & rhythm. No rubs, gallops or murmurs. Lungs: Clear, upper mid-sternum TTP Abdomen: Soft, nontender, nondistended. No hepatosplenomegaly. No bruits or masses. Good bowel sounds. Extremities: No cyanosis, clubbing, rash, edema Neuro: Alert & oriented x 3, cranial nerves grossly intact. Moves all 4 extremities w/o difficulty. Affect pleasant.  ASSESSMENT & PLAN:  1. CAD: S/p anterior STEMI with occluded proximal LAD in 5/22.  Treated with DES but complicated by embolization to distal LAD (procedure completed with occluded distal LAD).  No ischemic type chest  pain, suspect symptoms he is describing is MSK. - Continue Plavix.  He is > 1 year post-PCI, now off ASA.  - Continue Crestor 40 mg daily. Check lipids today. 2. Chronic systolic heart failure: Ischemic cardiomyopathy. Echo (6/22) with EF 30-35%, no LV thrombus, LAD territory WMAs, RV normal, IVC normal. Echo in 3/23 with EF improved to 45%, normal RV.  NYHA class I-II, not volume overloaded on exam.  - Continiue Farxiga 10 mg daily. No GU symptoms. - Continue Coreg 3.125 mg bid.  - Continue Entresto 24/26 bid.  BMET today. - Continue spironolactone 12.5 mg daily. - He is out of ICD range to ensure EF stability.  - Update echo next visit. Discussed importance of continuing all medications. 3. History of cardiac arrest: VF arrest associated with STEMI. Patient shocked x 5, 15 min CPR before ROSC.  No further VT.   - Now  out of ICD range.  - Continue Coreg.  4. CVA: MRI 6/7 with punctate acute to subacute frontal infarct. No obvious deficits.  - Continue DAPT.  Follow up in 4 months with Dr. Shirlee Latch + echo.  Anderson Malta Effort, FNP-BC 06/16/2022

## 2022-06-16 NOTE — Telephone Encounter (Addendum)
Son Karie Mainland made aware and is going to make sure he is taking it daily   ----- Message from Jacklynn Ganong, FNP sent at 06/16/2022 10:59 AM EDT ----- Labs ok but LDL higher. Please call son and ask if he has been consistently taking his  rosuvastatin. LDL goal < 55.

## 2022-07-10 ENCOUNTER — Other Ambulatory Visit: Payer: Self-pay | Admitting: Cardiovascular Disease

## 2022-07-10 NOTE — Telephone Encounter (Signed)
This is a CHF pt 

## 2022-08-02 ENCOUNTER — Other Ambulatory Visit: Payer: Self-pay | Admitting: Cardiology

## 2022-08-02 DIAGNOSIS — I252 Old myocardial infarction: Secondary | ICD-10-CM

## 2022-08-15 ENCOUNTER — Telehealth: Payer: Self-pay | Admitting: Family Medicine

## 2022-08-15 NOTE — Telephone Encounter (Signed)
Used interpreter services to call pt; interpreter left a voicemail to call back office to schedule appt.

## 2022-10-02 ENCOUNTER — Ambulatory Visit (INDEPENDENT_AMBULATORY_CARE_PROVIDER_SITE_OTHER): Payer: 59

## 2022-10-02 DIAGNOSIS — Z0000 Encounter for general adult medical examination without abnormal findings: Secondary | ICD-10-CM | POA: Diagnosis not present

## 2022-10-02 DIAGNOSIS — Z1211 Encounter for screening for malignant neoplasm of colon: Secondary | ICD-10-CM

## 2022-10-02 NOTE — Progress Notes (Signed)
Subjective:   Vincent Horton is a 67 y.o. male who presents for Medicare Annual/Subsequent preventive examination.  Visit Complete: Virtual  I connected with  Vincent Horton on 10/02/22 by a audio enabled telemedicine application and verified that I am speaking with the correct person using two identifiers. Daughter was also present on call.  Patient Location: Home  Provider Location: Office/Clinic  I discussed the limitations of evaluation and management by telemedicine. The patient expressed understanding and agreed to proceed.  Vital Signs: Patient was unable to self-report vital signs via telehealth due to a lack of equipment at home.  Review of Systems     Cardiac Risk Factors include: advanced age (>42men, >27 women);dyslipidemia;male gender     Objective:    Today's Vitals   There is no height or weight on file to calculate BMI.     10/02/2022    4:26 PM 09/11/2020   11:37 PM 08/10/2020    2:53 PM 07/24/2020    1:00 PM  Advanced Directives  Does Patient Have a Medical Advance Directive? No No No No  Would patient like information on creating a medical advance directive?  No - Patient declined No - Patient declined No - Patient declined    Current Medications (verified) Outpatient Encounter Medications as of 10/02/2022  Medication Sig   carvedilol (COREG) 3.125 MG tablet TAKE 1 TABLET BY MOUTH 2 TIMES DAILY.   clopidogrel (PLAVIX) 75 MG tablet TAKE 1 TABLET BY MOUTH EVERY DAY   ENTRESTO 24-26 MG TAKE 1 TABLET BY MOUTH TWICE A DAY   FARXIGA 10 MG TABS tablet TAKE 1 TABLET BY MOUTH DAILY BEFORE BREAKFAST.   rosuvastatin (CRESTOR) 40 MG tablet TAKE 1 TABLET BY MOUTH EVERY DAY   spironolactone (ALDACTONE) 25 MG tablet Take 0.5 tablets (12.5 mg total) by mouth daily.   benzonatate (TESSALON) 100 MG capsule Take 1 capsule (100 mg total) by mouth 3 (three) times daily as needed for cough. (Patient not taking: Reported on 10/02/2022)   No facility-administered encounter medications  on file as of 10/02/2022.    Allergies (verified) Beef-derived products, Pork-derived products, and Poultry meal   History: Past Medical History:  Diagnosis Date   Cardiac arrest [facility])    Coronary artery disease    Dysrhythmia    Myocardial infarction [facility])    Stroke [facility])    Past Surgical History:  Procedure Laterality Date   CARDIAC CATHETERIZATION     CORONARY/GRAFT ACUTE MI REVASCULARIZATION N/A 07/21/2020   Procedure: Coronary/Graft Acute MI Revascularization;  Surgeon: Swaziland, Peter M, MD;  Location: [facility] INVASIVE CV LAB;  Service: Cardiovascular;  Laterality: N/A;   LEFT HEART CATH AND CORONARY ANGIOGRAPHY N/A 07/21/2020   Procedure: LEFT HEART CATH AND CORONARY ANGIOGRAPHY;  Surgeon: Swaziland, Peter M, MD;  Location: [facility] INVASIVE CV LAB;  Service: Cardiovascular;  Laterality: N/A;   RIGHT HEART CATH N/A 07/21/2020   Procedure: RIGHT HEART CATH;  Surgeon: Swaziland, Peter M, MD;  Location: [facility] INVASIVE CV LAB;  Service: Cardiovascular;  Laterality: N/A;   Family History  Family history unknown: Yes   Social History   Socioeconomic History   Marital status: Widowed    Spouse name: Not on file   Number of children: Not on file   Years of education: Not on file   Highest education level: Not on file  Occupational History   Not on file  Tobacco Use   Smoking status: Former    Current packs/day: 1.00    Types: Cigarettes  Passive exposure: Past   Smokeless tobacco: Never  Vaping Use   Vaping status: Unknown  Substance and Sexual Activity   Alcohol use: Not Currently   Drug use: Not Currently   Sexual activity: Not on file  Other Topics Concern   Not on file  Social History Narrative   Lives with his 4 daughters   Right Handed   Drinks 3-4 cups caffeine   Social Determinants of Health   Financial Resource Strain: Low Risk  (10/02/2022)   Overall Financial Resource Strain (CARDIA)    Difficulty of Paying Living Expenses: Not hard at all  Food Insecurity: No Food  Insecurity (10/02/2022)   Hunger Vital Sign    Worried About Running Out of Food in the Last Year: Never true    Ran Out of Food in the Last Year: Never true  Transportation Needs: No Transportation Needs (10/02/2022)   PRAPARE - Administrator, Civil Service (Medical): No    Lack of Transportation (Non-Medical): No  Physical Activity: Inactive (10/02/2022)   Exercise Vital Sign    Days of Exercise per Week: 0 days    Minutes of Exercise per Session: 0 min  Stress: No Stress Concern Present (10/02/2022)   [name]    Feeling of Stress : Not at all  Social Connections: Moderately Isolated (10/02/2022)   [name]    Frequency of Communication with Friends and Family: More than three times a week    Frequency of Social Gatherings with Friends and Family: Three times a week    Attends Religious Services: More than 4 times per year    Active Member of Clubs or Organizations: No    Attends Banker Meetings: Never    Marital Status: Widowed    Tobacco Counseling Counseling given: Not Answered   Clinical Intake:  Pre-visit preparation completed: Yes  Pain : No/denies pain     Nutritional Risks: None Diabetes: No  How often do you need to have someone help you when you read instructions, pamphlets, or other written materials from your doctor or pharmacy?: 4 - Often  Interpreter Needed?: No  Comments: daughter interprets Information entered by :: NAllen LPN   Activities of Daily Living    10/02/2022    4:20 PM  In your present state of health, do you have any difficulty performing the following activities:  Hearing? 0  Vision? 0  Difficulty concentrating or making decisions? 0  Walking or climbing stairs? 0  Dressing or bathing? 0  Doing errands, shopping? 0  Preparing Food and eating ? N  Using the Toilet? N  In the past six months, have you  accidently leaked urine? N  Do you have problems with loss of bowel control? N  Managing your Medications? N  Managing your Finances? N  Housekeeping or managing your Housekeeping? N    Patient Care Team: Georganna Skeans, MD as PCP - General (Family Medicine) Nahser, Deloris Ping, MD as PCP - Cardiology (Cardiology)  Indicate any recent Medical Services you may have received from other than [facility] providers in the past year (date may be approximate).     Assessment:   This is a routine wellness examination for Vincent Horton.  Hearing/Vision screen Hearing Screening - Comments:: Denies hearing issues Vision Screening - Comments:: Regular eye exams, [facility]  Dietary issues and exercise activities discussed:     Goals Addressed  This Visit's Progress    Patient Stated       10/02/2022, denies any goals at this time       Depression Screen    10/02/2022    4:27 PM 10/16/2020    4:33 PM 08/30/2020    9:35 AM  PHQ 2/9 Scores  PHQ - 2 Score 0 0 1  PHQ- 9 Score 0 0 3    Fall Risk    10/02/2022    4:26 PM 09/28/2020    1:46 PM 08/30/2020    9:24 AM  Fall Risk   Falls in the past year? 0 0 0  Number falls in past yr: 0  0  Injury with Fall? 0  0  Risk for fall due to : Medication side effect    Follow up Falls prevention discussed;Falls evaluation completed      MEDICARE RISK AT HOME:  Medicare Risk at Home - 10/02/22 1626     Any stairs in or around the home? Yes    If so, are there any without handrails? No    Home free of loose throw rugs in walkways, pet beds, electrical cords, etc? Yes    Adequate lighting in your home to reduce risk of falls? Yes    Life alert? No    Use of a cane, walker or w/c? No    Grab bars in the bathroom? No    Shower chair or bench in shower? No    Elevated toilet seat or a handicapped toilet? No             TIMED UP AND GO:  Was the test performed?  No    Cognitive Function:  6 CIT not administered due to language  barrier. Patient appears cognitive via conversation through daughter.        Immunizations  There is no immunization history on file for this patient.  TDAP status: Due, Education has been provided regarding the importance of this vaccine. Advised may receive this vaccine at local pharmacy or Health Dept. Aware to provide a copy of the vaccination record if obtained from local pharmacy or Health Dept. Verbalized acceptance and understanding.  Flu Vaccine status: Due, Education has been provided regarding the importance of this vaccine. Advised may receive this vaccine at local pharmacy or Health Dept. Aware to provide a copy of the vaccination record if obtained from local pharmacy or Health Dept. Verbalized acceptance and understanding.  Pneumococcal vaccine status: Due, Education has been provided regarding the importance of this vaccine. Advised may receive this vaccine at local pharmacy or Health Dept. Aware to provide a copy of the vaccination record if obtained from local pharmacy or Health Dept. Verbalized acceptance and understanding.  Covid-19 vaccine status: Information provided on how to obtain vaccines.   Qualifies for Shingles Vaccine? Yes   Zostavax completed No   Shingrix Completed?: No.    Education has been provided regarding the importance of this vaccine. Patient has been advised to call insurance company to determine out of pocket expense if they have not yet received this vaccine. Advised may also receive vaccine at local pharmacy or Health Dept. Verbalized acceptance and understanding.  Screening Tests Health Maintenance  Topic Date Due   Pneumonia Vaccine 67+ Years old (1 of 2 - PCV) Never done   DTaP/Tdap/Td (1 - Tdap) Never done   Colonoscopy  Never done   Zoster Vaccines- Shingrix (1 of 2) Never done   COVID-19 Vaccine (1 - 2023-24 season) Never  done   INFLUENZA VACCINE  10/02/2022   Medicare Annual Wellness (AWV)  10/02/2023   Hepatitis C Screening  Completed    HPV VACCINES  Aged Out    Health Maintenance  Health Maintenance Due  Topic Date Due   Pneumonia Vaccine 47+ Years old (1 of 2 - PCV) Never done   DTaP/Tdap/Td (1 - Tdap) Never done   Colonoscopy  Never done   Zoster Vaccines- Shingrix (1 of 2) Never done   COVID-19 Vaccine (1 - 2023-24 season) Never done   INFLUENZA VACCINE  10/02/2022    Colorectal cancer screening: cologuard ordered today  Lung Cancer Screening: (Low Dose CT Chest recommended if Age 82-80 years, 20 pack-year currently smoking OR have quit w/in 15years.) does not qualify.   Lung Cancer Screening Referral: no  Additional Screening:  Hepatitis C Screening: does qualify; Completed 07/26/2020  Vision Screening: Recommended annual ophthalmology exams for early detection of glaucoma and other disorders of the eye. Is the patient up to date with their annual eye exam?  Yes  Who is the provider or what is the name of the office in which the patient attends annual eye exams? [facility] If pt is not established with a provider, would they like to be referred to a provider to establish care? No .   Dental Screening: Recommended annual dental exams for proper oral hygiene  Diabetic Foot Exam: n/a  Community Resource Referral / Chronic Care Management: CRR required this visit?  No   CCM required this visit?  No     Plan:     I have personally reviewed and noted the following in the patient's chart:   Medical and social history Use of alcohol, tobacco or illicit drugs  Current medications and supplements including opioid prescriptions. Patient is not currently taking opioid prescriptions. Functional ability and status Nutritional status Physical activity Advanced directives List of other physicians Hospitalizations, surgeries, and ER visits in previous 12 months Vitals Screenings to include cognitive, depression, and falls Referrals and appointments  In addition, I have reviewed and discussed  with patient certain preventive protocols, quality metrics, and best practice recommendations. A written personalized care plan for preventive services as well as general preventive health recommendations were provided to patient.     Barb Merino, LPN   03/08/1094   After Visit Summary: (MyChart) Due to this being a telephonic visit, the after visit summary with patients personalized plan was offered to patient via MyChart   Nurse Notes: none

## 2022-10-02 NOTE — Addendum Note (Signed)
Addended by: Barb Merino on: 10/02/2022 04:41 PM   Modules accepted: Orders

## 2022-10-02 NOTE — Patient Instructions (Signed)
Vincent Horton , Thank you for taking time to come for your Medicare Wellness Visit. I appreciate your ongoing commitment to your health goals. Please review the following plan we discussed and let me know if I can assist you in the future.   Referrals/Orders/Follow-Ups/Clinician Recommendations: cologuard ordered today  This is a list of the screening recommended for you and due dates:  Health Maintenance  Topic Date Due   Pneumonia Vaccine (1 of 2 - PCV) Never done   DTaP/Tdap/Td vaccine (1 - Tdap) Never done   Colon Cancer Screening  Never done   Zoster (Shingles) Vaccine (1 of 2) Never done   COVID-19 Vaccine (1 - 2023-24 season) Never done   Flu Shot  10/02/2022   Medicare Annual Wellness Visit  10/02/2023   Hepatitis C Screening  Completed   HPV Vaccine  Aged Out    Advanced directives: (ACP Link)Information on Advanced Care Planning can be found at [facility] Advance Health Care Directives Advance Health Care Directives ([email or web address]   Next Medicare Annual Wellness Visit scheduled for next year: No, schedule is not open for next year  Preventive Care 60 Years and Older, Male  Preventive care refers to lifestyle choices and visits with your health care provider that can promote health and wellness. What does preventive care include? A yearly physical exam. This is also called an annual well check. Dental exams once or twice a year. Routine eye exams. Ask your health care provider how often you should have your eyes checked. Personal lifestyle choices, including: Daily care of your teeth and gums. Regular physical activity. Eating a healthy diet. Avoiding tobacco and drug use. Limiting alcohol use. Practicing safe sex. Taking low doses of aspirin every day. Taking vitamin and mineral supplements as recommended by your health care provider. What happens during an annual well check? The services and screenings done by your health care provider during your  annual well check will depend on your age, overall health, lifestyle risk factors, and family history of disease. Counseling  Your health care provider may ask you questions about your: Alcohol use. Tobacco use. Drug use. Emotional well-being. Home and relationship well-being. Sexual activity. Eating habits. History of falls. Memory and ability to understand (cognition). Work and work Astronomer. Screening  You may have the following tests or measurements: Height, weight, and BMI. Blood pressure. Lipid and cholesterol levels. These may be checked every 5 years, or more frequently if you are over 17 years old. Skin check. Lung cancer screening. You may have this screening every year starting at age 4 if you have a 30-pack-year history of smoking and currently smoke or have quit within the past 15 years. Fecal occult blood test (FOBT) of the stool. You may have this test every year starting at age 29. Flexible sigmoidoscopy or colonoscopy. You may have a sigmoidoscopy every 5 years or a colonoscopy every 10 years starting at age 86. Prostate cancer screening. Recommendations will vary depending on your family history and other risks. Hepatitis C blood test. Hepatitis B blood test. Sexually transmitted disease (STD) testing. Diabetes screening. This is done by checking your blood sugar (glucose) after you have not eaten for a while (fasting). You may have this done every 1-3 years. Abdominal aortic aneurysm (AAA) screening. You may need this if you are a current or former smoker. Osteoporosis. You may be screened starting at age 6 if you are at high risk. Talk with your health care provider about your test results,  treatment options, and if necessary, the need for more tests. Vaccines  Your health care provider may recommend certain vaccines, such as: Influenza vaccine. This is recommended every year. Tetanus, diphtheria, and acellular pertussis (Tdap, Td) vaccine. You may need a Td  booster every 10 years. Zoster vaccine. You may need this after age 59. Pneumococcal 13-valent conjugate (PCV13) vaccine. One dose is recommended after age 36. Pneumococcal polysaccharide (PPSV23) vaccine. One dose is recommended after age 57. Talk to your health care provider about which screenings and vaccines you need and how often you need them. This information is not intended to replace advice given to you by your health care provider. Make sure you discuss any questions you have with your health care provider. Document Released: 03/16/2015 Document Revised: 11/07/2015 Document Reviewed: 12/19/2014 Elsevier Interactive Patient Education  2017 [employer].  Fall Prevention in the Home Falls can cause injuries. They can happen to people of all ages. There are many things you can do to make your home safe and to help prevent falls. What can I do on the outside of my home? Regularly fix the edges of walkways and driveways and fix any cracks. Remove anything that might make you trip as you walk through a door, such as a raised step or threshold. Trim any bushes or trees on the path to your home. Use bright outdoor lighting. Clear any walking paths of anything that might make someone trip, such as rocks or tools. Regularly check to see if handrails are loose or broken. Make sure that both sides of any steps have handrails. Any raised decks and porches should have guardrails on the edges. Have any leaves, snow, or ice cleared regularly. Use sand or salt on walking paths during winter. Clean up any spills in your garage right away. This includes oil or grease spills. What can I do in the bathroom? Use night lights. Install grab bars by the toilet and in the tub and shower. Do not use towel bars as grab bars. Use non-skid mats or decals in the tub or shower. If you need to sit down in the shower, use a plastic, non-slip stool. Keep the floor dry. Clean up any water that spills on the floor  as soon as it happens. Remove soap buildup in the tub or shower regularly. Attach bath mats securely with double-sided non-slip rug tape. Do not have throw rugs and other things on the floor that can make you trip. What can I do in the bedroom? Use night lights. Make sure that you have a light by your bed that is easy to reach. Do not use any sheets or blankets that are too big for your bed. They should not hang down onto the floor. Have a firm chair that has side arms. You can use this for support while you get dressed. Do not have throw rugs and other things on the floor that can make you trip. What can I do in the kitchen? Clean up any spills right away. Avoid walking on wet floors. Keep items that you use a lot in easy-to-reach places. If you need to reach something above you, use a strong step stool that has a grab bar. Keep electrical cords out of the way. Do not use floor polish or wax that makes floors slippery. If you must use wax, use non-skid floor wax. Do not have throw rugs and other things on the floor that can make you trip. What can I do with my stairs? Do  not leave any items on the stairs. Make sure that there are handrails on both sides of the stairs and use them. Fix handrails that are broken or loose. Make sure that handrails are as long as the stairways. Check any carpeting to make sure that it is firmly attached to the stairs. Fix any carpet that is loose or worn. Avoid having throw rugs at the top or bottom of the stairs. If you do have throw rugs, attach them to the floor with carpet tape. Make sure that you have a light switch at the top of the stairs and the bottom of the stairs. If you do not have them, ask someone to add them for you. What else can I do to help prevent falls? Wear shoes that: Do not have high heels. Have rubber bottoms. Are comfortable and fit you well. Are closed at the toe. Do not wear sandals. If you use a stepladder: Make sure that it is  fully opened. Do not climb a closed stepladder. Make sure that both sides of the stepladder are locked into place. Ask someone to hold it for you, if possible. Clearly mark and make sure that you can see: Any grab bars or handrails. First and last steps. Where the edge of each step is. Use tools that help you move around (mobility aids) if they are needed. These include: Canes. Walkers. Scooters. Crutches. Turn on the lights when you go into a dark area. Replace any light bulbs as soon as they burn out. Set up your furniture so you have a clear path. Avoid moving your furniture around. If any of your floors are uneven, fix them. If there are any pets around you, be aware of where they are. Review your medicines with your doctor. Some medicines can make you feel dizzy. This can increase your chance of falling. Ask your doctor what other things that you can do to help prevent falls. This information is not intended to replace advice given to you by your health care provider. Make sure you discuss any questions you have with your health care provider. Document Released: 12/14/2008 Document Revised: 07/26/2015 Document Reviewed: 03/24/2014 Elsevier Interactive Patient Education  2017 [employer].

## 2022-10-07 ENCOUNTER — Other Ambulatory Visit: Payer: Self-pay | Admitting: Cardiology

## 2022-10-16 ENCOUNTER — Encounter: Payer: 59 | Admitting: Family Medicine

## 2022-10-28 ENCOUNTER — Encounter: Payer: Self-pay | Admitting: Family Medicine

## 2022-10-28 ENCOUNTER — Ambulatory Visit (INDEPENDENT_AMBULATORY_CARE_PROVIDER_SITE_OTHER): Payer: 59 | Admitting: Family Medicine

## 2022-10-28 VITALS — BP 117/79 | HR 82 | Temp 96.7°F | Resp 16 | Ht 65.5 in | Wt 139.2 lb

## 2022-10-28 DIAGNOSIS — Z0000 Encounter for general adult medical examination without abnormal findings: Secondary | ICD-10-CM

## 2022-10-28 DIAGNOSIS — Z1329 Encounter for screening for other suspected endocrine disorder: Secondary | ICD-10-CM | POA: Diagnosis not present

## 2022-10-28 DIAGNOSIS — Z789 Other specified health status: Secondary | ICD-10-CM

## 2022-10-28 DIAGNOSIS — Z130 Encounter for screening for diseases of the blood and blood-forming organs and certain disorders involving the immune mechanism: Secondary | ICD-10-CM

## 2022-10-28 DIAGNOSIS — Z13228 Encounter for screening for other metabolic disorders: Secondary | ICD-10-CM

## 2022-10-28 DIAGNOSIS — Z13220 Encounter for screening for lipoid disorders: Secondary | ICD-10-CM

## 2022-10-28 MED ORDER — AMITRIPTYLINE HCL 10 MG PO TABS: 10.0000 mg | ORAL_TABLET | Freq: Every evening | ORAL | 2 refills | Status: DC | PRN

## 2022-10-28 NOTE — Progress Notes (Unsigned)
Annual exam  Leg pain at night when resting

## 2022-10-29 ENCOUNTER — Encounter: Payer: Self-pay | Admitting: Family Medicine

## 2022-10-29 LAB — CMP14+EGFR
ALT: 12 IU/L (ref 0–44)
AST: 18 IU/L (ref 0–40)
Albumin: 4.7 g/dL (ref 3.9–4.9)
Alkaline Phosphatase: 90 IU/L (ref 44–121)
BUN/Creatinine Ratio: 9 — ABNORMAL LOW (ref 10–24)
BUN: 12 mg/dL (ref 8–27)
Bilirubin Total: 0.3 mg/dL (ref 0.0–1.2)
CO2: 22 mmol/L (ref 20–29)
Calcium: 9.4 mg/dL (ref 8.6–10.2)
Chloride: 103 mmol/L (ref 96–106)
Creatinine, Ser: 1.29 mg/dL — ABNORMAL HIGH (ref 0.76–1.27)
Globulin, Total: 2.3 g/dL (ref 1.5–4.5)
Glucose: 123 mg/dL — ABNORMAL HIGH (ref 70–99)
Potassium: 4.8 mmol/L (ref 3.5–5.2)
Sodium: 139 mmol/L (ref 134–144)
Total Protein: 7 g/dL (ref 6.0–8.5)
eGFR: 60 mL/min/{1.73_m2} (ref >59)

## 2022-10-29 LAB — HEMOGLOBIN A1C
Est. average glucose Bld gHb Est-mCnc: 151 mg/dL
Hgb A1c MFr Bld: 6.9 % — ABNORMAL HIGH (ref 4.8–5.6)

## 2022-10-29 LAB — CBC WITH DIFFERENTIAL/PLATELET
Basophils Absolute: 0.1 10*3/uL (ref 0.0–0.2)
Basos: 1 %
EOS (ABSOLUTE): 0.3 10*3/uL (ref 0.0–0.4)
Eos: 5 %
Hematocrit: 45 % (ref 37.5–51.0)
Hemoglobin: 15 g/dL (ref 13.0–17.7)
Immature Grans (Abs): 0 10*3/uL (ref 0.0–0.1)
Immature Granulocytes: 0 %
Lymphocytes Absolute: 2.6 10*3/uL (ref 0.7–3.1)
Lymphs: 50 %
MCH: 29.9 pg (ref 26.6–33.0)
MCHC: 33.3 g/dL (ref 31.5–35.7)
MCV: 90 fL (ref 79–97)
Monocytes Absolute: 0.4 10*3/uL (ref 0.1–0.9)
Monocytes: 8 %
Neutrophils Absolute: 1.9 10*3/uL (ref 1.4–7.0)
Neutrophils: 36 %
Platelets: 231 10*3/uL (ref 150–450)
RBC: 5.02 x10E6/uL (ref 4.14–5.80)
RDW: 13.3 % (ref 11.6–15.4)
WBC: 5.2 10*3/uL (ref 3.4–10.8)

## 2022-10-29 LAB — LIPID PANEL
Chol/HDL Ratio: 2.4 ratio (ref 0.0–5.0)
Cholesterol, Total: 118 mg/dL (ref 100–199)
HDL: 50 mg/dL (ref >39)
LDL Chol Calc (NIH): 50 mg/dL (ref 0–99)
Triglycerides: 96 mg/dL (ref 0–149)
VLDL Cholesterol Cal: 18 mg/dL (ref 5–40)

## 2022-10-29 LAB — VITAMIN D 25 HYDROXY (VIT D DEFICIENCY, FRACTURES): Vit D, 25-Hydroxy: 25.5 ng/mL — ABNORMAL LOW (ref 30.0–100.0)

## 2022-10-29 MED ORDER — VITAMIN D (ERGOCALCIFEROL) 1.25 MG (50000 UNIT) PO CAPS: 50000.0000 [IU] | ORAL_CAPSULE | ORAL | 0 refills | Status: AC

## 2022-10-29 NOTE — Progress Notes (Signed)
Established Patient Office Visit  Subjective    Patient ID: Vincent Horton, male    DOB: 1955/12/18  Age: 67 y.o. MRN: [id number]  CC:  Chief Complaint  Patient presents with   Annual Exam    Leg pain at night when resting    HPI Vincent Horton presents for routine annual exam. Patient denies acute complaints or concerns. This visit was aided by an interpreter.   Outpatient Encounter Medications as of 10/28/2022  Medication Sig   amitriptyline (ELAVIL) 10 MG tablet Take 1 tablet (10 mg total) by mouth at bedtime as needed for sleep.   benzonatate (TESSALON) 100 MG capsule Take 1 capsule (100 mg total) by mouth 3 (three) times daily as needed for cough. (Patient not taking: Reported on 10/02/2022)   carvedilol (COREG) 3.125 MG tablet TAKE 1 TABLET BY MOUTH 2 TIMES DAILY.   clopidogrel (PLAVIX) 75 MG tablet TAKE 1 TABLET BY MOUTH EVERY DAY   ENTRESTO 24-26 MG TAKE 1 TABLET BY MOUTH TWICE A DAY   FARXIGA 10 MG TABS tablet TAKE 1 TABLET BY MOUTH DAILY BEFORE BREAKFAST.   rosuvastatin (CRESTOR) 40 MG tablet TAKE 1 TABLET BY MOUTH EVERY DAY   spironolactone (ALDACTONE) 25 MG tablet Take 0.5 tablets (12.5 mg total) by mouth daily.   No facility-administered encounter medications on file as of 10/28/2022.    Past Medical History:  Diagnosis Date   Cardiac arrest [facility])    Coronary artery disease    Dysrhythmia    Myocardial infarction [facility])    Stroke [facility])     Past Surgical History:  Procedure Laterality Date   CARDIAC CATHETERIZATION     CORONARY/GRAFT ACUTE MI REVASCULARIZATION N/A 07/21/2020   Procedure: Coronary/Graft Acute MI Revascularization;  Surgeon: Swaziland, Peter M, MD;  Location: [facility] INVASIVE CV LAB;  Service: Cardiovascular;  Laterality: N/A;   LEFT HEART CATH AND CORONARY ANGIOGRAPHY N/A 07/21/2020   Procedure: LEFT HEART CATH AND CORONARY ANGIOGRAPHY;  Surgeon: Swaziland, Peter M, MD;  Location: [facility] INVASIVE CV LAB;  Service: Cardiovascular;  Laterality: N/A;   RIGHT HEART CATH N/A  07/21/2020   Procedure: RIGHT HEART CATH;  Surgeon: Swaziland, Peter M, MD;  Location: [facility] INVASIVE CV LAB;  Service: Cardiovascular;  Laterality: N/A;    Family History  Family history unknown: Yes    Social History   Socioeconomic History   Marital status: Widowed    Spouse name: Not on file   Number of children: Not on file   Years of education: Not on file   Highest education level: Not on file  Occupational History   Not on file  Tobacco Use   Smoking status: Former    Current packs/day: 1.00    Types: Cigarettes    Passive exposure: Past   Smokeless tobacco: Never  Vaping Use   Vaping status: Unknown  Substance and Sexual Activity   Alcohol use: Not Currently   Drug use: Not Currently   Sexual activity: Not on file  Other Topics Concern   Not on file  Social History Narrative   Lives with his 4 daughters   Right Handed   Drinks 3-4 cups caffeine   Social Determinants of Health   Financial Resource Strain: Low Risk  (10/02/2022)   Overall Financial Resource Strain (CARDIA)    Difficulty of Paying Living Expenses: Not hard at all  Food Insecurity: No Food Insecurity (10/02/2022)   Hunger Vital Sign    Worried About Running Out of Food in the Last Year:  Never true    Ran Out of Food in the Last Year: Never true  Transportation Needs: No Transportation Needs (10/02/2022)   PRAPARE - Administrator, Civil Service (Medical): No    Lack of Transportation (Non-Medical): No  Physical Activity: Inactive (10/02/2022)   Exercise Vital Sign    Days of Exercise per Week: 0 days    Minutes of Exercise per Session: 0 min  Stress: No Stress Concern Present (10/02/2022)   [name]    Feeling of Stress : Not at all  Social Connections: Moderately Isolated (10/02/2022)   [name]    Frequency of Communication with Friends and Family: More than three times a week     Frequency of Social Gatherings with Friends and Family: Three times a week    Attends Religious Services: More than 4 times per year    Active Member of Clubs or Organizations: No    Attends Banker Meetings: Never    Marital Status: Widowed  Intimate Partner Violence: Not At Risk (10/02/2022)   Humiliation, Afraid, Rape, and Kick questionnaire    Fear of Current or Ex-Partner: No    Emotionally Abused: No    Physically Abused: No    Sexually Abused: No    Review of Systems  All other systems reviewed and are negative.       Objective    BP 117/79 (BP Location: Right Arm, Patient Position: Sitting, Cuff Size: Normal)   Pulse 82   Temp (!) 96.7 F (35.9 C) (Temporal)   Resp 16   Ht 5' 5.5" (1.664 m)   Wt 139 lb 3.2 oz (63.1 kg)   SpO2 95%   BMI 22.81 kg/m   Physical Exam Vitals and nursing note reviewed.  Constitutional:      General: He is not in acute distress. HENT:     Head: Normocephalic and atraumatic.     Right Ear: Tympanic membrane, ear canal and external ear normal.     Left Ear: Tympanic membrane, ear canal and external ear normal.     Nose: Nose normal.     Mouth/Throat:     Mouth: Mucous membranes are moist.     Pharynx: Oropharynx is clear.  Eyes:     Conjunctiva/sclera: Conjunctivae normal.     Pupils: Pupils are equal, round, and reactive to light.  Neck:     Thyroid: No thyromegaly.  Cardiovascular:     Rate and Rhythm: Normal rate and regular rhythm.     Heart sounds: Normal heart sounds. No murmur heard. Pulmonary:     Effort: Pulmonary effort is normal.     Breath sounds: Normal breath sounds.  Abdominal:     General: There is no distension.     Palpations: Abdomen is soft. There is no mass.     Tenderness: There is no abdominal tenderness.     Hernia: There is no hernia in the left inguinal area or right inguinal area.  Musculoskeletal:        General: Normal range of motion.     Cervical back: Normal range of motion and  neck supple.     Right lower leg: No edema.     Left lower leg: No edema.  Skin:    General: Skin is warm and dry.  Neurological:     General: No focal deficit present.     Mental Status: He is alert and oriented  to person, place, and time. Mental status is at baseline.  Psychiatric:        Mood and Affect: Mood normal.        Behavior: Behavior normal.         Assessment & Plan:   1. Annual physical exam  - CMP14+EGFR  2. Screening for deficiency anemia  - CBC with Differential  3. Screening for lipid disorders  - Lipid Panel  4. Screening for endocrine/metabolic/immunity disorders  - Vitamin D, 25-hydroxy - Hemoglobin A1c  5. Language barrier to communication     No follow-ups on file.   [name], MD

## 2022-11-05 ENCOUNTER — Encounter: Payer: Self-pay | Admitting: *Deleted

## 2022-11-15 ENCOUNTER — Other Ambulatory Visit: Payer: Self-pay | Admitting: Family Medicine

## 2022-11-27 ENCOUNTER — Other Ambulatory Visit (HOSPITAL_COMMUNITY): Payer: Self-pay | Admitting: Family Medicine

## 2022-12-05 ENCOUNTER — Other Ambulatory Visit: Payer: Self-pay | Admitting: Family Medicine

## 2022-12-05 DIAGNOSIS — Z1211 Encounter for screening for malignant neoplasm of colon: Secondary | ICD-10-CM

## 2022-12-05 DIAGNOSIS — Z1212 Encounter for screening for malignant neoplasm of rectum: Secondary | ICD-10-CM

## 2022-12-26 ENCOUNTER — Other Ambulatory Visit (HOSPITAL_COMMUNITY): Payer: Self-pay | Admitting: Family Medicine

## 2023-01-17 ENCOUNTER — Other Ambulatory Visit: Payer: Self-pay | Admitting: Cardiology

## 2023-03-27 ENCOUNTER — Other Ambulatory Visit: Payer: Self-pay

## 2023-03-27 ENCOUNTER — Ambulatory Visit
Admission: RE | Admit: 2023-03-27 | Discharge: 2023-03-27 | Disposition: A | Discharge status: Home / Self Care | Payer: 59 | Source: Ambulatory Visit | Attending: Physician Assistant | Admitting: Physician Assistant

## 2023-03-27 VITALS — BP 121/86 | HR 73 | Temp 98.2°F | Resp 18

## 2023-03-27 DIAGNOSIS — H61002 Unspecified perichondritis of left external ear: Secondary | ICD-10-CM

## 2023-03-27 MED ORDER — CIPROFLOXACIN HCL 500 MG PO TABS: 500.0000 mg | ORAL_TABLET | Freq: Two times a day (BID) | ORAL | 0 refills | Status: AC

## 2023-03-27 NOTE — ED Provider Notes (Signed)
[name]    CSN: [id number] Arrival date & time: 03/27/23  1628      History   Chief Complaint Chief Complaint  Patient presents with   Ear Pain    HPI Vincent Horton is a 68 y.o. male.   Patient presents today accompanied by his daughter.  He speaks Somali and video interpreter was utilized during visit.  Reports a 2-week history of redness and swelling of his left external ear.  He reports symptoms began after he started wearing hat but denies any specific injury or wound.  He has not been taking any over-the-counter medication for symptom management.  He reports that pain is minimal at rest but is significant at night particularly when he puts pressure on this area when sleeping.  Denies any fever, nausea, vomiting.  Denies any recent manipulation or piercing.  He denies history of diabetes or immunosuppression.  Denies any recent antibiotics.    Past Medical History:  Diagnosis Date   Cardiac arrest [facility])    Coronary artery disease    Dysrhythmia    Myocardial infarction [facility])    Stroke [facility])     Patient Active Problem List   Diagnosis Date Noted   Chronic combined systolic and diastolic CHF (congestive heart failure) (HCC) 05/13/2021   History of ST elevation myocardial infarction (STEMI) 09/26/2020   History of pleural effusion 09/26/2020   Mixed hyperlipidemia 09/26/2020   CVA (cerebral vascular accident) (HCC) 08/10/2020   Pleural effusion    Protein-calorie malnutrition, severe 07/25/2020   Acute hypoxemic respiratory failure (HCC) 07/24/2020   Ischemic cardiomyopathy 07/24/2020   Aspiration pneumonia (HCC) 07/24/2020   Pulmonary edema cardiac cause (HCC) 07/24/2020   Acute kidney injury (HCC) 07/24/2020   Shock liver 07/24/2020   Cardiogenic shock (HCC) 07/24/2020   Transaminitis 07/24/2020   Protein calorie malnutrition (HCC) 07/23/2020   Deficiency of macronutrients 07/23/2020   Acute ST elevation myocardial infarction (STEMI) due to  occlusion of left anterior descending (LAD) coronary artery (HCC) 07/21/2020   Cardiac arrest (HCC) 07/21/2020   STEMI involving left anterior descending coronary artery (HCC) 07/21/2020    Past Surgical History:  Procedure Laterality Date   CARDIAC CATHETERIZATION     CORONARY/GRAFT ACUTE MI REVASCULARIZATION N/A 07/21/2020   Procedure: Coronary/Graft Acute MI Revascularization;  Surgeon: Swaziland, Peter M, MD;  Location: [facility] INVASIVE CV LAB;  Service: Cardiovascular;  Laterality: N/A;   LEFT HEART CATH AND CORONARY ANGIOGRAPHY N/A 07/21/2020   Procedure: LEFT HEART CATH AND CORONARY ANGIOGRAPHY;  Surgeon: Swaziland, Peter M, MD;  Location: [facility] INVASIVE CV LAB;  Service: Cardiovascular;  Laterality: N/A;   RIGHT HEART CATH N/A 07/21/2020   Procedure: RIGHT HEART CATH;  Surgeon: Swaziland, Peter M, MD;  Location: [facility] INVASIVE CV LAB;  Service: Cardiovascular;  Laterality: N/A;       Home Medications    Prior to Admission medications   Medication Sig Start Date End Date Taking? Authorizing Provider  amitriptyline (ELAVIL) 10 MG tablet Take 1 tablet (10 mg total) by mouth at bedtime as needed for sleep. 10/28/22  Yes Georganna Skeans, MD  carvedilol (COREG) 3.125 MG tablet TAKE 1 TABLET BY MOUTH TWICE A DAY 12/26/22  Yes [place], Merkel, FNP  ciprofloxacin (CIPRO) 500 MG tablet Take 1 tablet (500 mg total) by mouth every 12 (twelve) hours for 7 days. 03/27/23 04/03/23 Yes Hope Brandenburger, Noberto Retort, PA-C  clopidogrel (PLAVIX) 75 MG tablet TAKE 1 TABLET BY MOUTH EVERY DAY 10/07/22  Yes Laurey Morale, MD  ENTRESTO 24-26 MG TAKE 1 TABLET BY MOUTH TWICE A DAY 06/12/22  Yes Laurey Morale, MD  FARXIGA 10 MG TABS tablet TAKE 1 TABLET BY MOUTH EVERY DAY BEFORE BREAKFAST 11/17/22  Yes [place], Anderson Malta, FNP  rosuvastatin (CRESTOR) 40 MG tablet TAKE 1 TABLET BY MOUTH EVERY DAY 08/04/22  Yes Laurey Morale, MD  spironolactone (ALDACTONE) 25 MG tablet TAKE 1/2 TABLET BY MOUTH EVERY DAY 01/19/23  Yes Laurey Morale, MD   Vitamin D, Ergocalciferol, (DRISDOL) 1.25 MG (50000 UNIT) CAPS capsule Take 1 capsule (50,000 Units total) by mouth every 7 (seven) days. 10/29/22  Yes Georganna Skeans, MD    Family History Family History  Family history unknown: Yes    Social History Social History   Tobacco Use   Smoking status: Former    Current packs/day: 1.00    Types: Cigarettes    Passive exposure: Past   Smokeless tobacco: Never  Vaping Use   Vaping status: Unknown  Substance Use Topics   Alcohol use: Not Currently   Drug use: Not Currently     Allergies   Beef-derived drug products, Pork-derived products, and Poultry meal   Review of Systems Review of Systems  Constitutional:  Positive for activity change. Negative for appetite change, fatigue and fever.  HENT:  Positive for ear pain. Negative for congestion, sinus pressure, sneezing and sore throat.   Respiratory:  Negative for cough and shortness of breath.   Cardiovascular:  Negative for chest pain.  Gastrointestinal:  Negative for abdominal pain, diarrhea, nausea and vomiting.     Physical Exam Triage Vital Signs ED Triage Vitals [03/27/23 1714]  Encounter Vitals Group     BP 121/86     Systolic BP Percentile      Diastolic BP Percentile      Pulse Rate 73     Resp 18     Temp 98.2 F (36.8 C)     Temp Source Oral     SpO2 100 %     Weight      Height      Head Circumference      Peak Flow      Pain Score      Pain Loc      Pain Education      Exclude from Growth Chart    No data found.  Updated Vital Signs BP 121/86 (BP Location: Left Arm)   Pulse 73   Temp 98.2 F (36.8 C) (Oral)   Resp 18   SpO2 100%   Visual Acuity Right Eye Distance:   Left Eye Distance:   Bilateral Distance:    Right Eye Near:   Left Eye Near:    Bilateral Near:     Physical Exam Vitals reviewed.  Constitutional:      General: He is awake.     Appearance: Normal appearance. He is well-developed. He is not ill-appearing.      Comments: Very pleasant male appears stated age in no acute distress sitting comfortably in exam room  HENT:     Head: Normocephalic and atraumatic.     Right Ear: Tympanic membrane, ear canal and external ear normal. Tympanic membrane is not erythematous or bulging.     Left Ear: Tympanic membrane, ear canal and external ear normal. Tympanic membrane is not erythematous or bulging.     Ears:      Nose: Nose normal.     Mouth/Throat:     Pharynx: Uvula midline. No oropharyngeal exudate,  uvula swelling or postnasal drip.  Cardiovascular:     Rate and Rhythm: Normal rate and regular rhythm.     Heart sounds: Normal heart sounds, S1 normal and S2 normal. No murmur heard. Pulmonary:     Effort: Pulmonary effort is normal. No accessory muscle usage or respiratory distress.     Breath sounds: Normal breath sounds. No stridor. No wheezing, rhonchi or rales.     Comments: Clear to auscultation bilaterally Lymphadenopathy:     Head:     Right side of head: No submental, submandibular or tonsillar adenopathy.     Left side of head: No submental, submandibular or tonsillar adenopathy.     Cervical: No cervical adenopathy.  Neurological:     Mental Status: He is alert.  Psychiatric:        Behavior: Behavior is cooperative.      UC Treatments / Results  Labs (all labs ordered are listed, but only abnormal results are displayed) Labs Reviewed - No data to display  EKG   Radiology No results found.  Procedures Procedures (including critical care time)  Medications Ordered in UC Medications - No data to display  Initial Impression / Assessment and Plan / UC Course  I have reviewed the triage vital signs and the nursing notes.  Pertinent labs & imaging results that were available during my care of the patient were reviewed by me and considered in my medical decision making (see chart for details).     Patient is well-appearing, afebrile, nontoxic, nontachycardic.  No indication  for I&D as there is no fluctuance or drainable fluid collection on exam.  Concern for perichondritis given clinical presentation.  Will start ciprofloxacin 500 mg twice daily for 7 days.  He does have a history of cardiovascular disease but last EKG obtained 06/16/2022 had normal QT interval.  He was encouraged to keep the area clean and we discussed that if symptoms or not improving quickly he should follow-up with ENT.  He was given the contact information for local provider with instruction to call to schedule an appointment.  He can use Tylenol as needed for pain relief.  Discussed that if anything worsens or changes and he has spread of redness and swelling, increasing pain, abnormal drainage, fever, nausea, vomiting he needs to be seen emergently.  Strict return precautions given.  Discussed case with Dr. Rachael Darby who agreed with treatment plan.  Final Clinical Impressions(s) / UC Diagnoses   Final diagnoses:  Perichondritis of left ear     Discharge Instructions      Start ciprofloxacin twice daily for 7 days.  Keep this area clean with soap and water.  Follow-up with ENT soon as possible.  Call them to schedule an appointment.  If anything worsens and you have increasing redness or swelling, increasing pain, drainage from the ear, fever, nausea, vomiting, chest pain, heart racing, shortness of breath you need to go to the emergency room immediately.  Ku bilow ciprofloxacin laba jeer maalintii muddo 7 maalmood ah.  Meeshan ku nadiifi saabuun iyo biyo.  La soco ENT sida ugu dhakhsaha badan.  Wac iyaga si ay ballan u qabsadaan.  Haddii ay wax ka sii daraan oo aad [place] casaan ama barar sii [place], xanuunka sii [place], dhegta oo dareere ah, qandho, lallabbo, matag, xanuun laabta, garaaca wadnaha, neefta oo kugu yaraata waxaad u baahan tahay inaad isla markiiba u tagto qolka gargaarka degdegga ah.     ED Prescriptions     Medication Sig Dispense Auth. Provider  ciprofloxacin (CIPRO)  500 MG tablet Take 1 tablet (500 mg total) by mouth every 12 (twelve) hours for 7 days. 14 tablet Adiel Mcnamara, Noberto Retort, PA-C      PDMP not reviewed this encounter.   Jeani Hawking, PA-C 03/27/23 1741

## 2023-03-27 NOTE — ED Triage Notes (Signed)
Pt has painful bump on left ear x 2 weeks

## 2023-03-27 NOTE — Discharge Instructions (Signed)
Start ciprofloxacin twice daily for 7 days.  Keep this area clean with soap and water.  Follow-up with ENT soon as possible.  Call them to schedule an appointment.  If anything worsens and you have increasing redness or swelling, increasing pain, drainage from the ear, fever, nausea, vomiting, chest pain, heart racing, shortness of breath you need to go to the emergency room immediately.  Ku bilow ciprofloxacin laba jeer maalintii muddo 7 maalmood ah.  Meeshan ku nadiifi saabuun iyo biyo.  La soco ENT sida ugu dhakhsaha badan.  Wac iyaga si ay ballan u qabsadaan.  Haddii ay wax ka sii daraan oo aad [place] casaan ama barar sii [place], xanuunka sii [place], dhegta oo dareere ah, qandho, lallabbo, matag, xanuun laabta, garaaca wadnaha, neefta oo kugu yaraata waxaad u baahan tahay inaad isla markiiba u tagto qolka gargaarka degdegga ah.

## 2023-04-01 ENCOUNTER — Other Ambulatory Visit (HOSPITAL_COMMUNITY): Payer: Self-pay | Admitting: Family Medicine

## 2023-04-01 ENCOUNTER — Other Ambulatory Visit: Payer: Self-pay | Admitting: Cardiology

## 2023-04-15 ENCOUNTER — Encounter: Payer: Self-pay | Admitting: Family Medicine

## 2023-04-15 ENCOUNTER — Ambulatory Visit (INDEPENDENT_AMBULATORY_CARE_PROVIDER_SITE_OTHER): Payer: 59 | Admitting: Family Medicine

## 2023-04-15 VITALS — BP 135/87 | HR 85 | Temp 97.9°F | Resp 16 | Ht 64.0 in | Wt 151.2 lb

## 2023-04-15 DIAGNOSIS — Z789 Other specified health status: Secondary | ICD-10-CM

## 2023-04-15 DIAGNOSIS — H6192 Disorder of left external ear, unspecified: Secondary | ICD-10-CM | POA: Diagnosis not present

## 2023-04-15 DIAGNOSIS — R0789 Other chest pain: Secondary | ICD-10-CM | POA: Diagnosis not present

## 2023-04-15 MED ORDER — DICLOFENAC SODIUM 1 % EX GEL: 2.0000 g | Freq: Four times a day (QID) | CUTANEOUS | 2 refills | Status: AC

## 2023-04-15 NOTE — Progress Notes (Unsigned)
Established Patient Office Visit  Subjective    Patient ID: Vincent Horton, male    DOB: [birth year]  Age: 68 y.o. MRN: [id number]  CC:  Chief Complaint  Patient presents with   Follow-up    Left outer ear pain, pain on right breast sometimes    HPI Vincent Horton presents with complaint of sore and pain of left ear. He was recently seen in  UC for sx. Has an appt with ENT on the 27th for follow up. He also complains of right sided breast/chest pain. Denies known trauma or injury. This visit was aided by an interpreter.   Outpatient Encounter Medications as of 04/15/2023  Medication Sig   amitriptyline (ELAVIL) 10 MG tablet Take 1 tablet (10 mg total) by mouth at bedtime as needed for sleep.   carvedilol (COREG) 3.125 MG tablet Take 1 tablet (3.125 mg total) by mouth 2 (two) times daily. NEEDS FOLLOW UP APPOINTMENT FOR MORE REFILL S   clopidogrel (PLAVIX) 75 MG tablet Take 1 tablet (75 mg total) by mouth daily. NEEDS FOLLOW UP APPOINTMENT FOR MORE REFILLS   ENTRESTO 24-26 MG TAKE 1 TABLET BY MOUTH TWICE A DAY   FARXIGA 10 MG TABS tablet TAKE 1 TABLET BY MOUTH EVERY DAY BEFORE BREAKFAST   rosuvastatin (CRESTOR) 40 MG tablet TAKE 1 TABLET BY MOUTH EVERY DAY   spironolactone (ALDACTONE) 25 MG tablet TAKE 1/2 TABLET BY MOUTH EVERY DAY   Vitamin D, Ergocalciferol, (DRISDOL) 1.25 MG (50000 UNIT) CAPS capsule Take 1 capsule (50,000 Units total) by mouth every 7 (seven) days.   No facility-administered encounter medications on file as of 04/15/2023.    Past Medical History:  Diagnosis Date   Cardiac arrest [facility])    Coronary artery disease    Dysrhythmia    Myocardial infarction [facility])    Stroke [facility])     Past Surgical History:  Procedure Laterality Date   CARDIAC CATHETERIZATION     CORONARY/GRAFT ACUTE MI REVASCULARIZATION N/A 07/21/2020   Procedure: Coronary/Graft Acute MI Revascularization;  Surgeon: Swazil[place], Peter M, MD;  Location: [facility] INVASIVE CV LAB;  Service: Cardiovascular;  Laterality:  N/A;   LEFT HEART CATH AND CORONARY ANGIOGRAPHY N/A 07/21/2020   Procedure: LEFT HEART CATH AND CORONARY ANGIOGRAPHY;  Surgeon: Swazil[place], Peter M, MD;  Location: [facility] INVASIVE CV LAB;  Service: Cardiovascular;  Laterality: N/A;   RIGHT HEART CATH N/A 07/21/2020   Procedure: RIGHT HEART CATH;  Surgeon: Swazil[place], Peter M, MD;  Location: [facility] INVASIVE CV LAB;  Service: Cardiovascular;  Laterality: N/A;    Family History  Family history unknown: Yes    Social History   Socioeconomic History   Marital status: Widowed    Spouse name: Not on file   Number of children: Not on file   Years of education: Not on file   Highest education level: Not on file  Occupational History   Not on file  Tobacco Use   Smoking status: Former    Current packs/day: 1.00    Types: Cigarettes    Passive exposure: Past   Smokeless tobacco: Never  Vaping Use   Vaping status: Unknown  Substance and Sexual Activity   Alcohol use: Not Currently   Drug use: Not Currently   Sexual activity: Not on file  Other Topics Concern   Not on file  Social History Narrative   Lives with his 4 daughters   Right Handed   Drinks 3-4 cups caffeine   Social Drivers of Corporate investment banker  Strain: Low Risk  (10/02/2022)   Overall Financial Resource Strain (CARDIA)    Difficulty of Paying Living Expenses: Not hard at all  Food Insecurity: No Food Insecurity (10/02/2022)   Hunger Vital Sign    Worried About Running Out of Food in the Last Year: Never true    Ran Out of Food in the Last Year: Never true  Transportation Needs: No Transportation Needs (10/02/2022)   PRAPARE - Administrator, Civil Service (Medical): No    Lack of Transportation (Non-Medical): No  Physical Activity: Inactive (10/02/2022)   Exercise Vital Sign    Days of Exercise per Week: 0 days    Minutes of Exercise per Session: 0 min  Stress: No Stress Concern Present (10/02/2022)   [employer] of Occupational Health - Occupational Stress  Questionnaire    Feeling of Stress : Not at all  Social Connections: Moderately Isolated (10/02/2022)   [name]    Frequency of Communication with Friends and Family: More than three times a week    Frequency of Social Gatherings with Friends and Family: Three times a week    Attends Religious Services: More than 4 times per year    Active Member of Clubs or Organizations: No    Attends Banker Meetings: Never    Marital Status: Widowed  Intimate Partner Violence: Not At Risk (10/02/2022)   Humiliation, Afraid, Rape, and Kick questionnaire    Fear of Current or Ex-Partner: No    Emotionally Abused: No    Physically Abused: No    Sexually Abused: No    Review of Systems  All other systems reviewed and are negative.       Objective    BP 135/87 (BP Location: Right Arm, Patient Position: Sitting, Cuff Size: Normal)   Pulse 85   Temp 97.9 F (36.6 C) (Oral)   Resp 16   Ht 5\' 4"  (1.626 m)   Wt 151 lb 3.2 oz (68.6 kg)   SpO2 96%   BMI 25.95 kg/m   Physical Exam Vitals and nursing note reviewed.  Constitutional:      General: He is not in acute distress. HENT:     Ears:   Cardiovascular:     Rate and Rhythm: Normal rate and regular rhythm.  Pulmonary:     Effort: Pulmonary effort is normal.     Breath sounds: Normal breath sounds.  Chest:    Abdominal:     Palpations: Abdomen is soft.     Tenderness: There is no abdominal tenderness.  Neurological:     General: No focal deficit present.     Mental Status: He is alert and oriented to person, place, and time.         Assessment & Plan:   1. Skin lesion of left external ear (Primary) Keeps scheduled appt with consultant for further eval/mgt  2. Chest wall tenderness Voltaren gel prescribed.   3. Language barrier to communication   No follow-ups on file.   Vincent Raymond, MD

## 2023-04-17 ENCOUNTER — Encounter: Payer: Self-pay | Admitting: Family Medicine

## 2023-04-23 ENCOUNTER — Encounter: Payer: Self-pay | Admitting: Family Medicine

## 2023-05-01 ENCOUNTER — Ambulatory Visit (INDEPENDENT_AMBULATORY_CARE_PROVIDER_SITE_OTHER): Payer: 59 | Admitting: Family Medicine

## 2023-05-01 ENCOUNTER — Encounter: Payer: Self-pay | Admitting: Family Medicine

## 2023-05-01 VITALS — BP 117/78 | HR 86 | Temp 98.5°F | Resp 16 | Ht 65.5 in | Wt 152.4 lb

## 2023-05-01 DIAGNOSIS — H6192 Disorder of left external ear, unspecified: Secondary | ICD-10-CM

## 2023-05-01 DIAGNOSIS — R0789 Other chest pain: Secondary | ICD-10-CM

## 2023-05-01 DIAGNOSIS — Z789 Other specified health status: Secondary | ICD-10-CM

## 2023-05-03 IMAGING — DX DG CHEST 1V PORT
1 series · 1 of 1 positions shown · non-contrast
Comparison: July 23, 2020.

CLINICAL DATA: Congestive heart failure.

EXAM:
PORTABLE CHEST 1 VIEW

[chest]
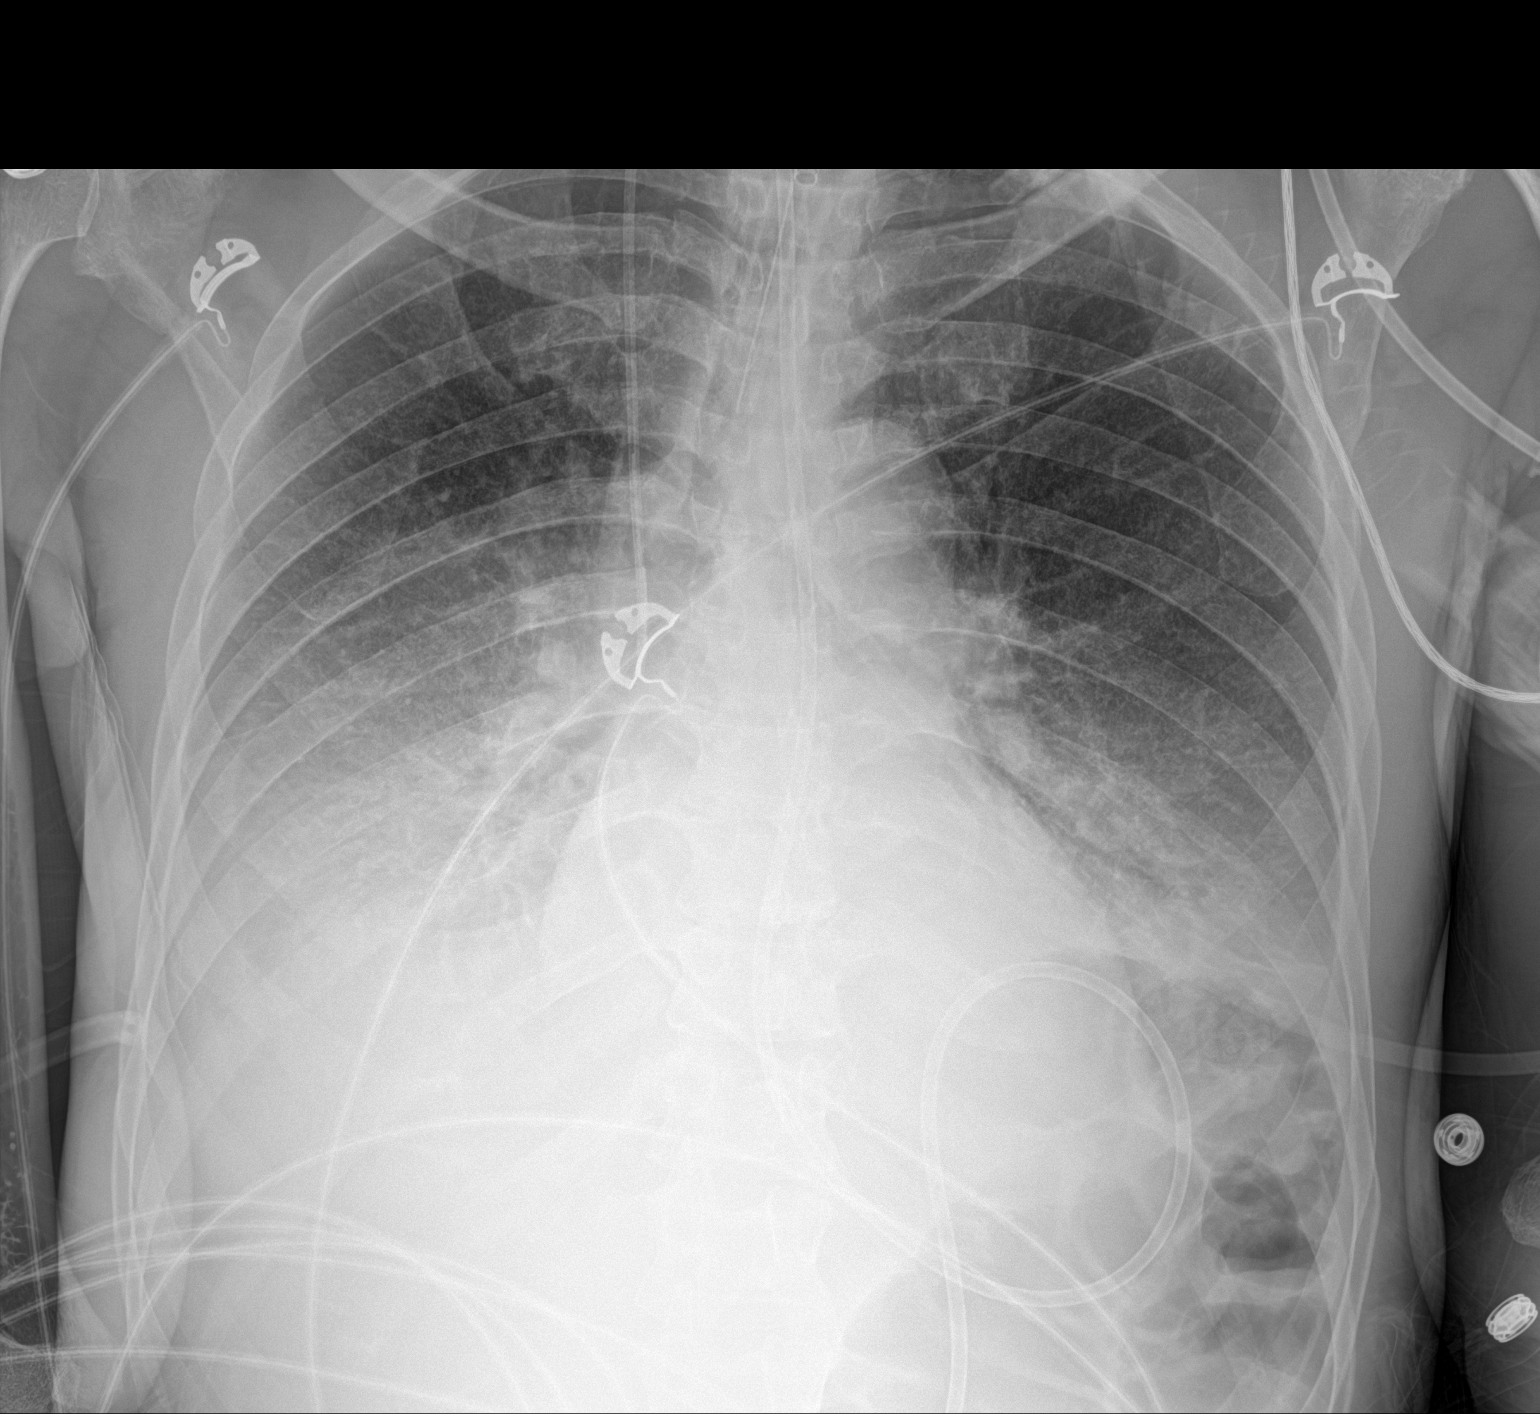

[1 of 1 positions shown; findings below may reference images not displayed]

FINDINGS: The heart size and mediastinal contours are within normal limits.
Endotracheal and feeding tubes are in good position. Right internal
jugular catheter is unchanged. No pneumothorax is noted. Stable
bibasilar atelectasis or edema is noted with associated pleural
effusions. The visualized skeletal structures are unremarkable.
IMPRESSION: Stable support apparatus. Stable bibasilar opacities as described
above.

## 2023-05-04 ENCOUNTER — Encounter: Payer: Self-pay | Admitting: Family Medicine

## 2023-05-04 NOTE — Progress Notes (Signed)
 Established Patient Office Visit  Subjective    Patient ID: Vincent Horton, male    DOB: 1955/08/18  Age: 68 y.o. MRN: [id number]  CC:  Chief Complaint  Patient presents with   Follow-up    HPI Vincent Horton presents for follow up of lesion of left ear. 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 His chest wall pain has resolved with present management. This visit was aided by interpreter.   Outpatient Encounter Medications as of 05/01/2023  Medication Sig   amitriptyline (ELAVIL) 10 MG tablet Take 1 tablet (10 mg total) by mouth at bedtime as needed for sleep.   carvedilol (COREG) 3.125 MG tablet Take 1 tablet (3.125 mg total) by mouth 2 (two) times daily. NEEDS FOLLOW UP APPOINTMENT FOR MORE REFILL S   clopidogrel (PLAVIX) 75 MG tablet Take 1 tablet (75 mg total) by mouth daily. NEEDS FOLLOW UP APPOINTMENT FOR MORE REFILLS   diclofenac Sodium (VOLTAREN) 1 % GEL Apply 2 g topically 4 (four) times daily.   ENTRESTO 24-26 MG TAKE 1 TABLET BY MOUTH TWICE A DAY   FARXIGA 10 MG TABS tablet TAKE 1 TABLET BY MOUTH EVERY DAY BEFORE BREAKFAST   rosuvastatin (CRESTOR) 40 MG tablet TAKE 1 TABLET BY MOUTH EVERY DAY   spironolactone (ALDACTONE) 25 MG tablet TAKE 1/2 TABLET BY MOUTH EVERY DAY   Vitamin D, Ergocalciferol, (DRISDOL) 1.25 MG (50000 UNIT) CAPS capsule Take 1 capsule (50,000 Units total) by mouth every 7 (seven) days.   No facility-administered encounter medications on file as of 05/01/2023.    Past Medical History:  Diagnosis Date   Cardiac arrest [facility])    Coronary artery disease    Dysrhythmia    Myocardial infarction [facility])    Stroke [facility])     Past Surgical History:  Procedure Laterality Date   CARDIAC CATHETERIZATION     CORONARY/GRAFT ACUTE MI REVASCULARIZATION N/A 07/21/2020   Procedure: Coronary/Graft Acute MI Revascularization;  Surgeon: [place]azil[place], Peter M, MD;  Location: [facility] INVASIVE CV LAB;  Service:  Cardiovascular;  Laterality: N/A;   LEFT HEART CATH AND CORONARY ANGIOGRAPHY N/A 07/21/2020   Procedure: LEFT HEART CATH AND CORONARY ANGIOGRAPHY;  Surgeon: Swaziland, Peter M, MD;  Location: [facility] INVASIVE CV LAB;  Service: Cardiovascular;  Laterality: N/A;   RIGHT HEART CATH N/A 07/21/2020   Procedure: RIGHT HEART CATH;  Surgeon: Swazil[place], Peter M, MD;  Location: [facility] INVASIVE CV LAB;  Service: Cardiovascular;  Laterality: N/A;    Family History  Family history unknown: Yes    Social History   Socioeconomic History   Marital status: Widowed    Spouse name: Not on file   Number of children: Not on file   Years of education: Not on file   Highest education level: Not on file  Occupational History   Not on file  Tobacco Use   Smoking status: Former    Current packs/day: 1.00    Types: Cigarettes    Passive exposure: Past   Smokeless tobacco: Never  Vaping Use   Vaping status: Unknown  Substance and Sexual Activity   Alcohol use: Not Currently   Drug use: Not Currently   Sexual activity: Not on file  Other Topics Concern   Not on file  Social History Narrative   Lives with his 4 daughters   Right Handed   Drinks 3-4 cups caffeine   Social Drivers of Health   Financial Resource Strain: Low Risk  (  10/02/2022)   Overall Financial Resource Strain (CARDIA)    Difficulty of Paying Living Expenses: Not hard at all  Food Insecurity: No Food Insecurity (10/02/2022)   Hunger Vital Sign    Worried About Running Out of Food in the Last Year: Never true    Ran Out of Food in the Last Year: Never true  Transportation Needs: No Transportation Needs (10/02/2022)   PRAPARE - Administrator, Civil Service (Medical): No    Lack of Transportation (Non-Medical): No  Physical Activity: Inactive (10/02/2022)   Exercise Vital Sign    Days of Exercise per Week: 0 days    Minutes of Exercise per Session: 0 min  Stress: No Stress Concern Present (10/02/2022)   [employer] of Occupational  Health - Occupational Stress Questionnaire    Feeling of Stress : Not at all  Social Connections: Moderately Isolated (10/02/2022)   [name]    Frequency of Communication with Friends and Family: More than three times a week    Frequency of Social Gatherings with Friends and Family: Three times a week    Attends Religious Services: More than 4 times per year    Active Member of Clubs or Organizations: No    Attends Banker Meetings: Never    Marital Status: Widowed  Intimate Partner Violence: Not At Risk (10/02/2022)   Humiliation, Afraid, Rape, and Kick questionnaire    Fear of Current or Ex-Partner: No    Emotionally Abused: No    Physically Abused: No    Sexually Abused: No    Review of Systems  All other systems reviewed and are negative.       Objective    BP 117/78   Pulse 86   Temp 98.5 F (36.9 C) (Oral)   Resp 16   Ht 5' 5.5" (1.664 m)   Wt 152 lb 6.4 oz (69.1 kg)   SpO2 95%   BMI 24.97 kg/m   Physical Exam Vitals and nursing note reviewed.  Constitutional:      General: He is not in acute distress. Cardiovascular:     Rate and Rhythm: Normal rate and regular rhythm.  Pulmonary:     Effort: Pulmonary effort is normal.     Breath sounds: Normal breath sounds.  Abdominal:     Palpations: Abdomen is soft.     Tenderness: There is no abdominal tenderness.  Neurological:     General: No focal deficit present.     Mental Status: He is alert and oriented to person, place, and time.         Assessment & Plan:   1. Skin lesion of left external ear (Primary) Management by consultant.   2. Chest wall tenderness Sx resolved with present management  3. Language barrier to communication    Return in about 6 months (around 10/29/2023) for follow up.   [name], MD

## 2023-05-08 IMAGING — DX DG CHEST 1V PORT
1 series · 1 of 1 positions shown · non-contrast
Comparison: 07/26/2020.

CLINICAL DATA: Intubation.

EXAM:
PORTABLE CHEST 1 VIEW

[chest ap]
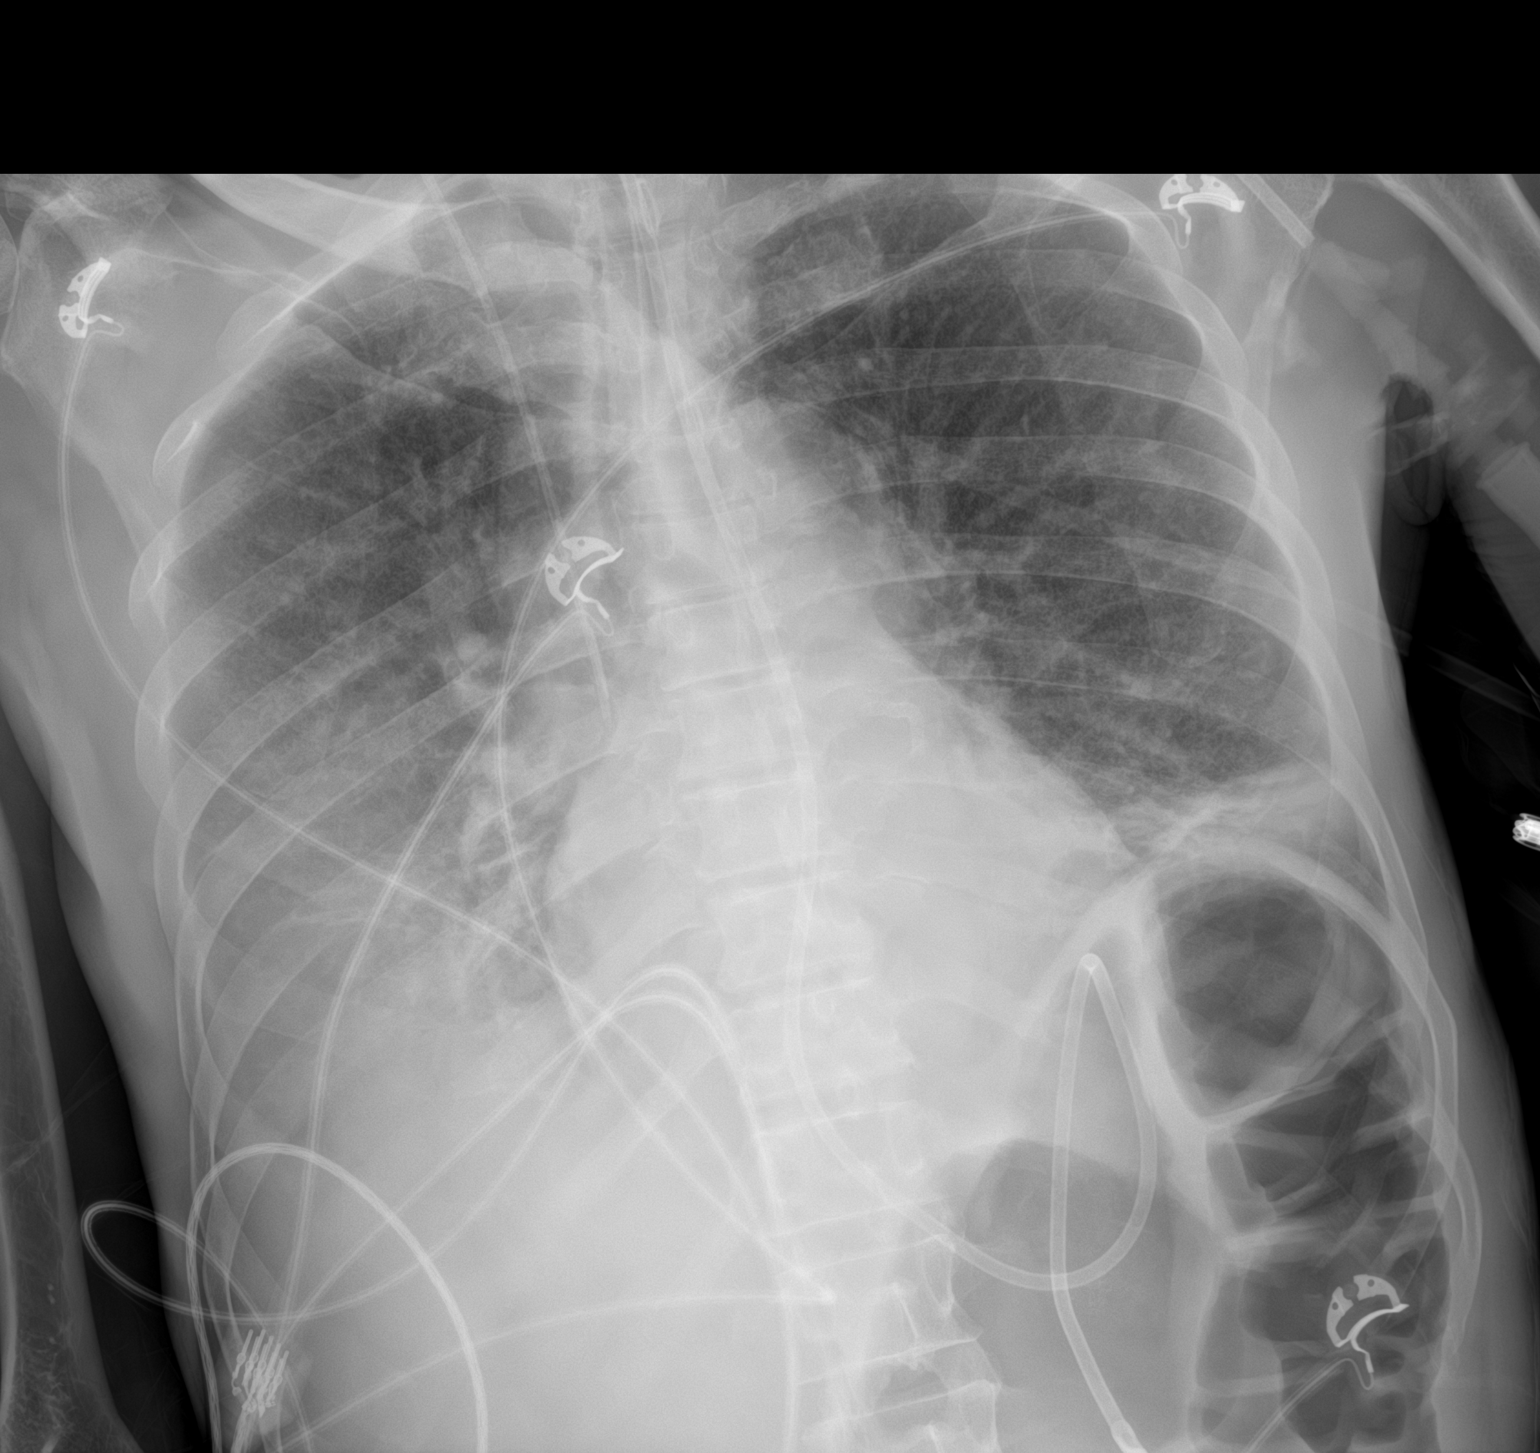

[1 of 1 positions shown; findings below may reference images not displayed]

FINDINGS: Endotracheal tube, feeding tube, right IJ catheter in stable
position. Heart size stable. Bibasilar pulmonary infiltrates/edema
again noted. Slight improvement on the left. Persistent bibasilar
atelectasis. Persistent mild to moderate right pleural effusion. No
pneumothorax.
IMPRESSION: 1.  Lines and tubes in stable position.

2. Persistent bibasilar infiltrates/edema again noted. Slight
improvement on the left. Persistent bibasilar atelectasis.
Persistent mild to moderate right pleural effusion.

## 2023-05-15 IMAGING — MR MR HEAD W/O CM
9 of 10 series · 36 of 48 positions shown · non-contrast
Comparison: None.

CLINICAL DATA: Persistent altered mental status post cardiac arrest

EXAM:
MRI HEAD WITHOUT CONTRAST
TECHNIQUE: Multiplanar, multiecho pulse sequences of the brain and surrounding
structures were obtained without intravenous contrast.

[Series 4: DWI · coronal · 5.0mm · 1.09mm/px · 8 of 76 slices shown (1 of 4)]
[im 1/76]
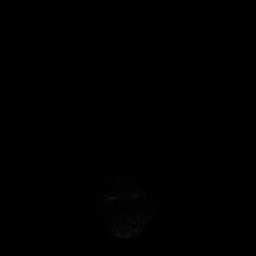
[im 11/76]
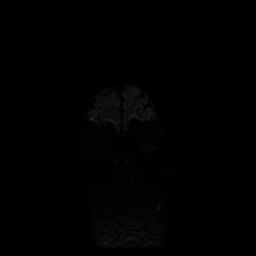
[im 22/76]
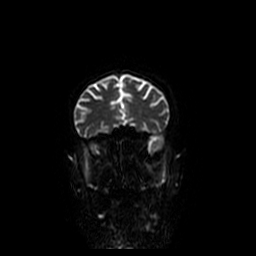
[im 33/76]
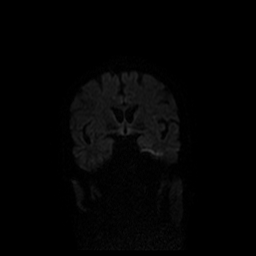
[im 43/76]
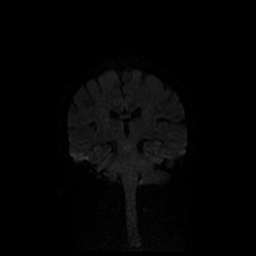
[im 54/76]
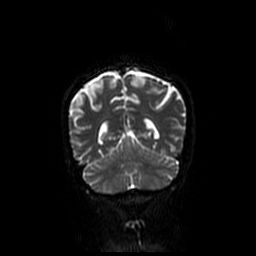
[im 65/76]
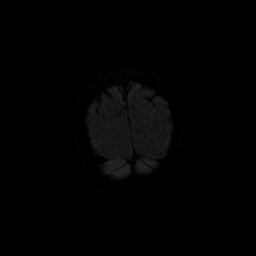
[im 76/76]
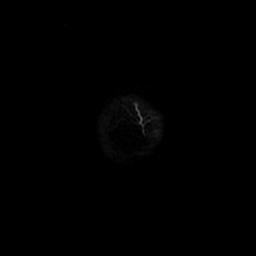

[Series 5: T1 · sagittal · 5.0mm · 0.47mm/px · 2 of 25 slices shown (1 of 2)]
[im 1/25]
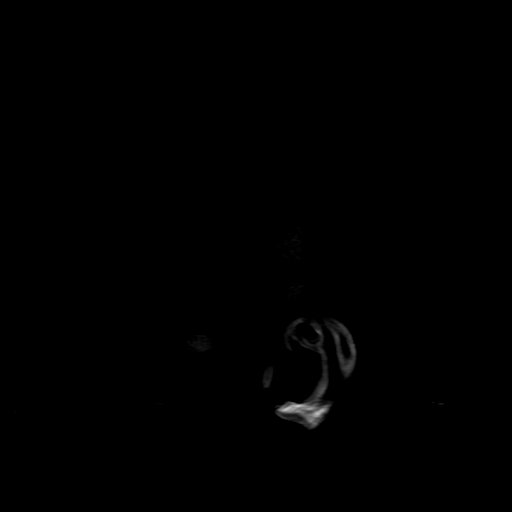
[im 25/25]
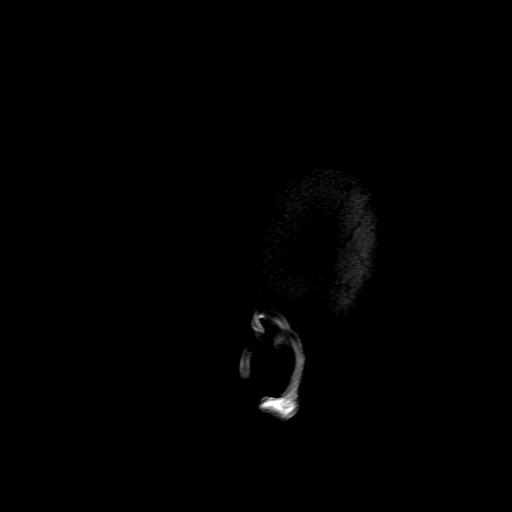

[Series 6: DWI · axial · 3.0mm · 1.09mm/px · z∈[-100,+32]mm · 8 of 100 slices shown (2 of 4)]
[im 1/100]
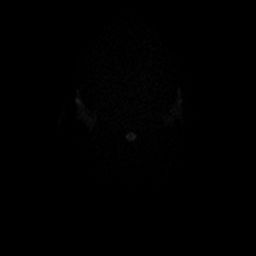
[im 12/100]
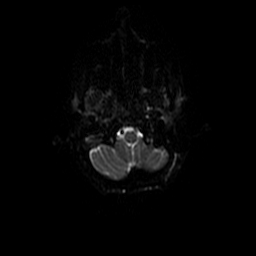
[im 34/100]
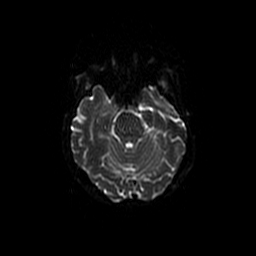
[im 45/100]
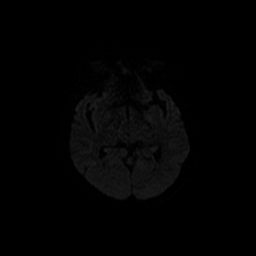
[im 56/100]
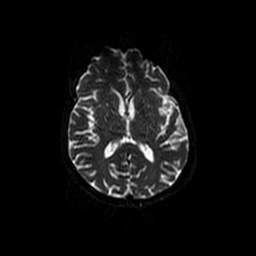
[im 67/100]
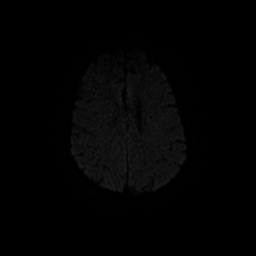
[im 89/100]
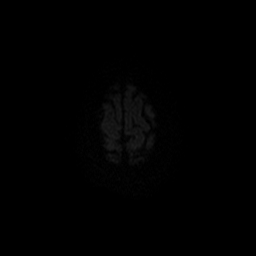
[im 100/100]
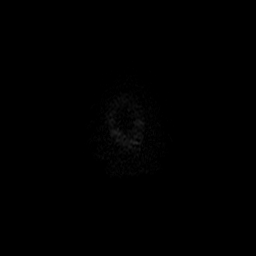

[Series 7: T2 · axial · 5.0mm · 0.43mm/px · z∈[-83,+49]mm · 2 of 25 slices shown (1 of 2)]
[im 1/25]
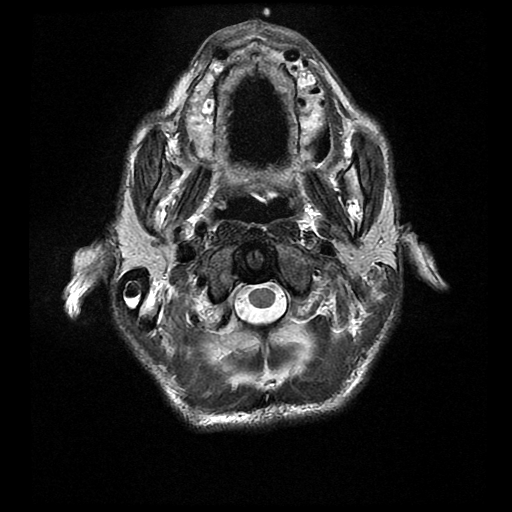
[im 25/25]
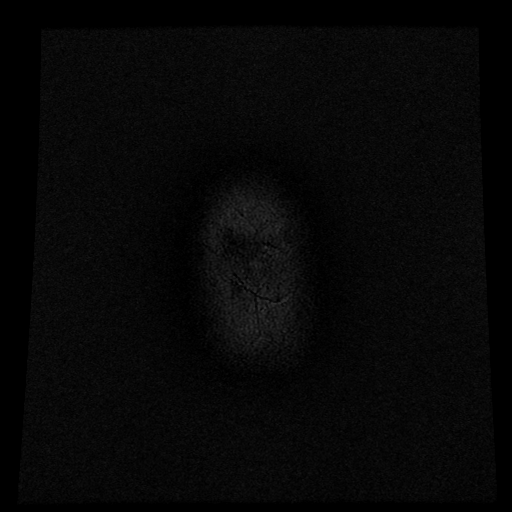

[Series 8: FLAIR · axial · 3.0mm · 0.43mm/px · z∈[-83,+49]mm · 2 of 25 slices shown]
[im 1/25]
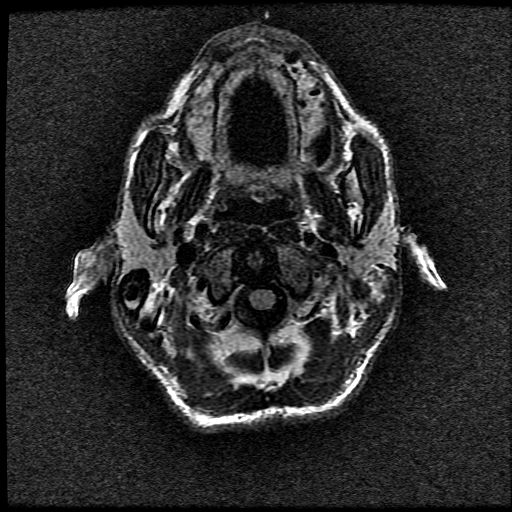
[im 25/25]
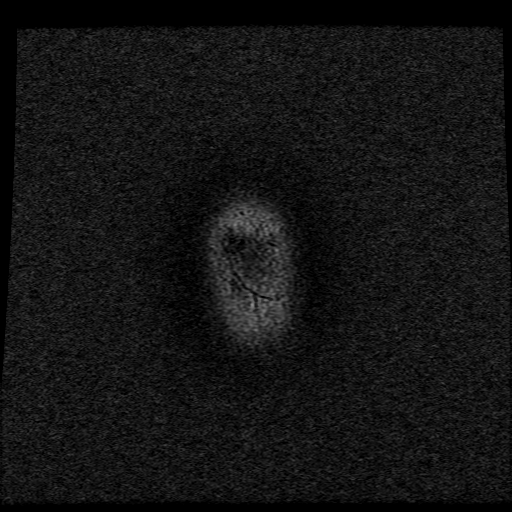

[Series 10: T1 · axial · 3.0mm · 0.47mm/px · z∈[-88,-73]mm · 2 of 100 slices shown (2 of 2)]
[im 1/100]
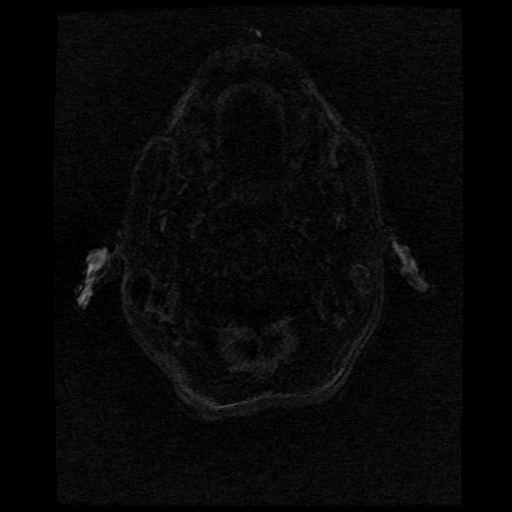
[im 12/100]
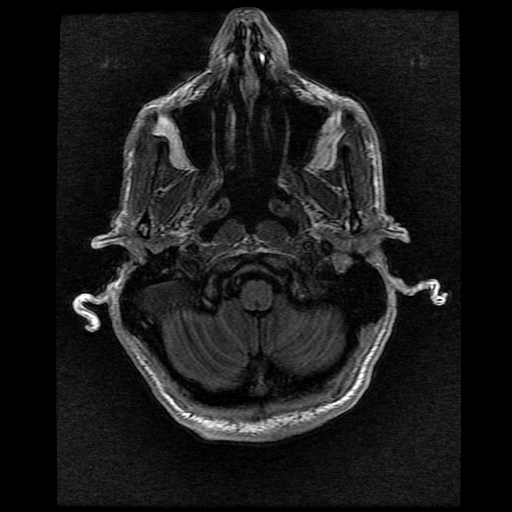

[Series 11: T2 · coronal · 5.0mm · 0.39mm/px · 3 of 29 slices shown (2 of 2)]
[im 1/29]
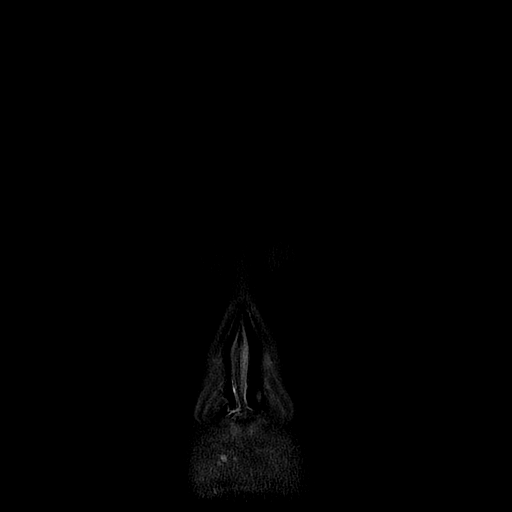
[im 15/29]
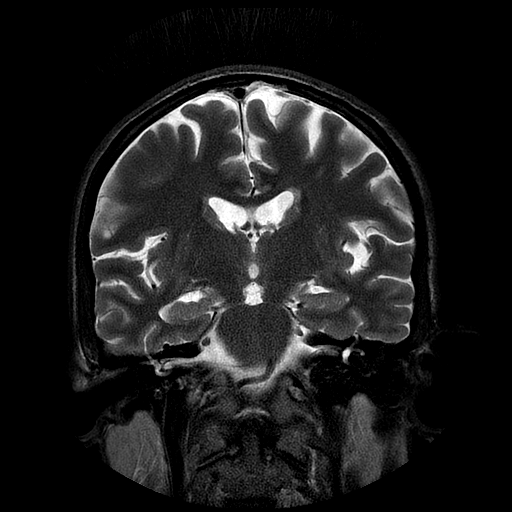
[im 29/29]
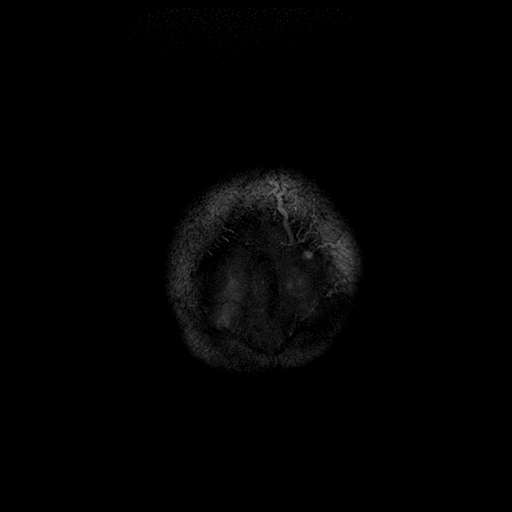

[Series 400: DWI · coronal · 5.0mm · 1.09mm/px · 4 of 38 slices shown (3 of 4)]
[im 1/38]
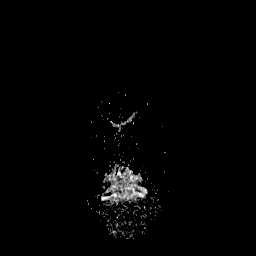
[im 13/38]
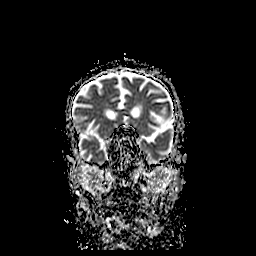
[im 25/38]
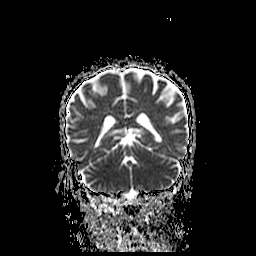
[im 38/38]
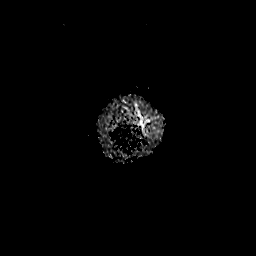

[Series 600: DWI · axial · 3.0mm · 1.09mm/px · z∈[-100,+32]mm · 5 of 50 slices shown (4 of 4)]
[im 1/50]
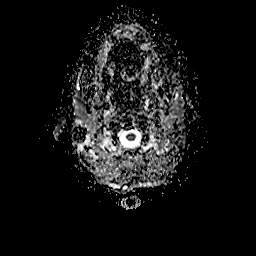
[im 13/50]
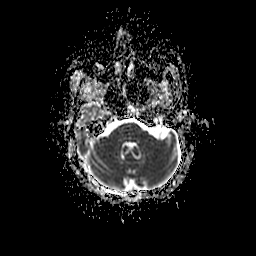
[im 25/50]
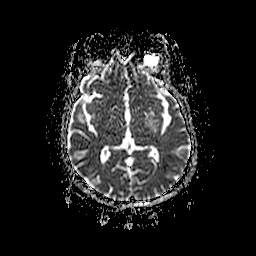
[im 37/50]
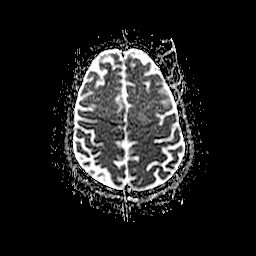
[im 50/50]
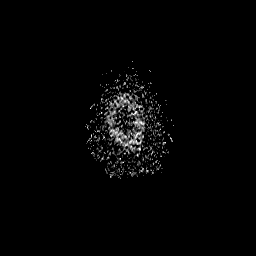

[36 of 48 positions shown; findings below may reference images not displayed]

FINDINGS: Brain: Punctate focus of reduced diffusion in the left frontal lobe.
Prominence of the ventricles and sulci reflects minor generalized
parenchymal volume loss. Few small foci of T2 hyperintensity in the
supratentorial white matter are nonspecific but may reflect minor
chronic microvascular ischemic changes. There is a chronic small
vessel infarct of the posterior left putamen. There is no evidence
of intracranial hemorrhage. There is no intracranial mass or mass
effect. There is no hydrocephalus or extra-axial fluid collection.

Vascular: Major vessel flow voids at the skull base are preserved.

Skull and upper cervical spine: Normal marrow signal is preserved.

Sinuses/Orbits: Mild mucosal thickening.  Right lens replacement.

Other: Sella is unremarkable.  Patchy mastoid fluid opacification.
IMPRESSION: Punctate acute to subacute left frontal infarct. No evidence of
hypoxic/ischemic injury.

## 2023-05-22 IMAGING — DX DG CHEST 1V PORT
1 series · 1 of 1 positions shown · non-contrast
Comparison: 08/11/2020, 08/02/2020

CLINICAL DATA: Productive cough

EXAM:
PORTABLE CHEST 1 VIEW

[chest ap]
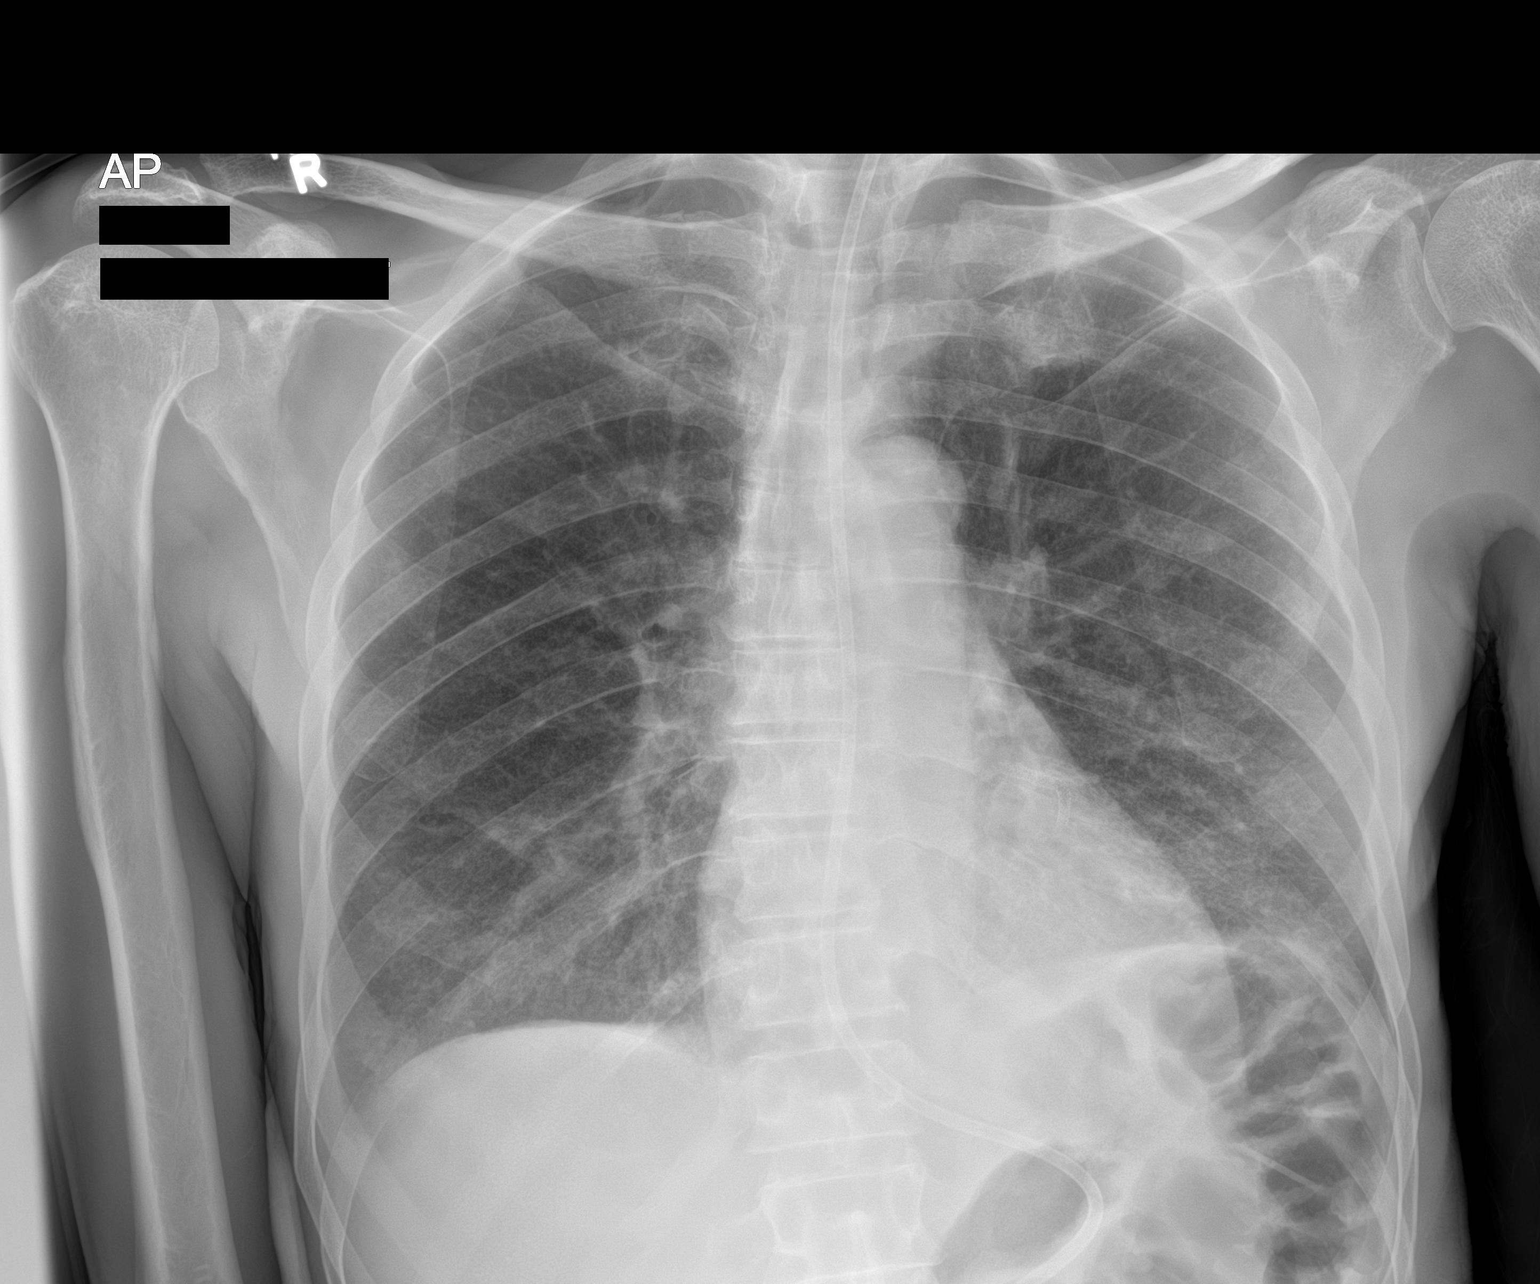

[1 of 1 positions shown; findings below may reference images not displayed]

FINDINGS: Esophageal tube tip extends below diaphragm but is incompletely
visualized. Improved aeration of left base with better visualization
of diaphragm. Minimal hazy infrahilar opacities. Normal
cardiomediastinal silhouette. No pneumothorax.
IMPRESSION: Slightly improved aeration at left base. There are mild residual
infrahilar hazy opacities which may be due to atelectasis, mild
residual edema, or pneumonia.

## 2023-05-23 IMAGING — DX DG ABD PORTABLE 1V
1 series · 1 of 1 positions shown · non-contrast
Comparison: 08/08/2020 and earlier.

CLINICAL DATA: 65-year-old male feeding tube placement.

EXAM:
PORTABLE ABDOMEN - 1 VIEW

[abdomen supine]
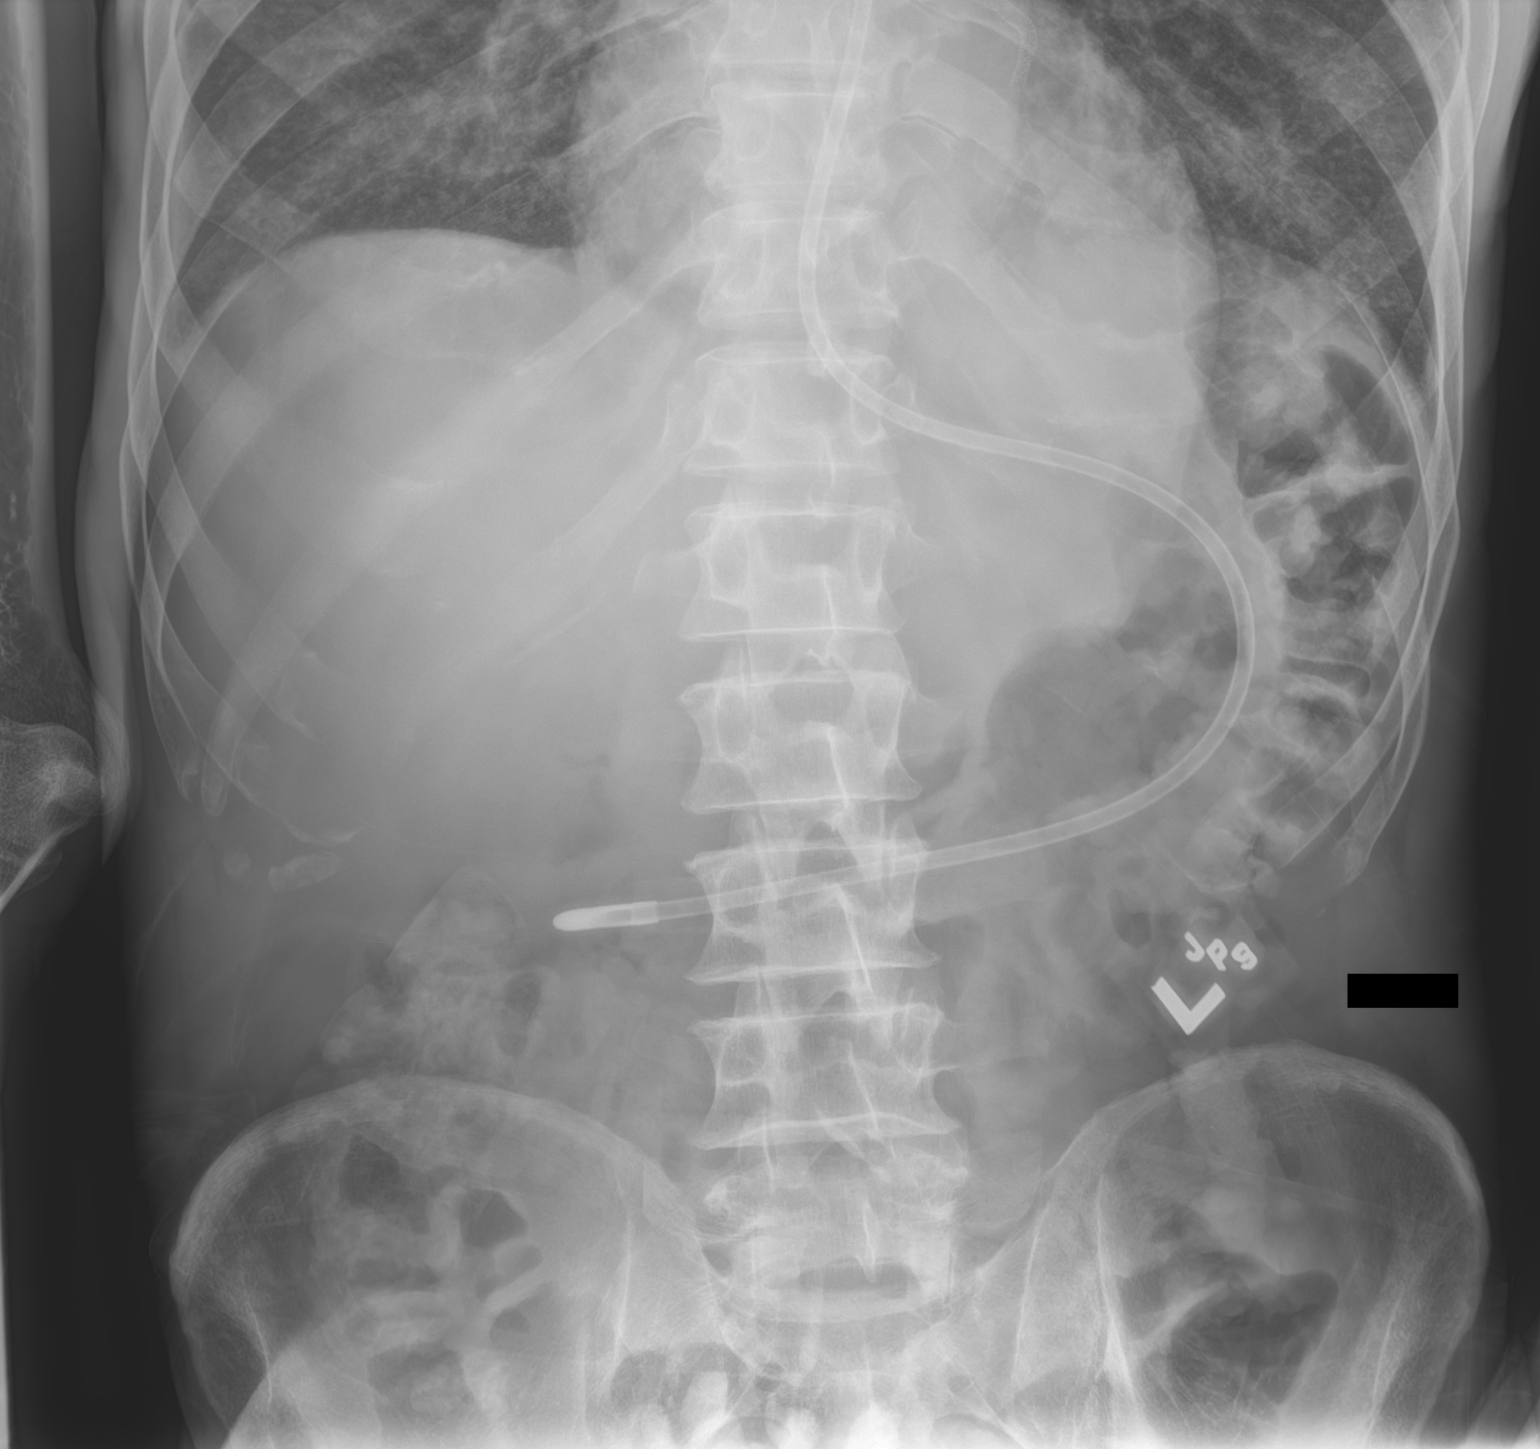

[1 of 1 positions shown; findings below may reference images not displayed]

FINDINGS: Portable AP view at 2629 hours. Enteric tube position not
significantly changed, tip at the gastric antrum or duodenal bulb
level to the right of the lumbar spine. Visible bowel-gas pattern
remains within normal limits. Stable lung bases. No acute osseous
abnormality identified.
IMPRESSION: Enteric tube tip at the gastric antrum or duodenal bulb, could be
advanced for further post pyloric placement.

## 2023-05-29 ENCOUNTER — Other Ambulatory Visit: Payer: Self-pay | Admitting: Cardiology

## 2023-06-24 ENCOUNTER — Ambulatory Visit (HOSPITAL_COMMUNITY)
Admission: RE | Admit: 2023-06-24 | Discharge: 2023-06-24 | Disposition: A | Discharge status: Home / Self Care | Source: Ambulatory Visit | Attending: Adult Health | Admitting: Adult Health

## 2023-06-24 ENCOUNTER — Encounter (HOSPITAL_COMMUNITY): Payer: Self-pay

## 2023-06-24 VITALS — BP 122/80 | HR 96 | Ht 65.5 in | Wt 151.4 lb

## 2023-06-24 DIAGNOSIS — I252 Old myocardial infarction: Secondary | ICD-10-CM | POA: Diagnosis not present

## 2023-06-24 DIAGNOSIS — Z8674 Personal history of sudden cardiac arrest: Secondary | ICD-10-CM | POA: Diagnosis not present

## 2023-06-24 DIAGNOSIS — I2510 Atherosclerotic heart disease of native coronary artery without angina pectoris: Secondary | ICD-10-CM | POA: Insufficient documentation

## 2023-06-24 DIAGNOSIS — I5042 Chronic combined systolic (congestive) and diastolic (congestive) heart failure: Secondary | ICD-10-CM | POA: Diagnosis present

## 2023-06-24 DIAGNOSIS — I5022 Chronic systolic (congestive) heart failure: Secondary | ICD-10-CM | POA: Diagnosis not present

## 2023-06-24 DIAGNOSIS — Z955 Presence of coronary angioplasty implant and graft: Secondary | ICD-10-CM | POA: Diagnosis not present

## 2023-06-24 DIAGNOSIS — C44221 Squamous cell carcinoma of skin of unspecified ear and external auricular canal: Secondary | ICD-10-CM | POA: Insufficient documentation

## 2023-06-24 DIAGNOSIS — Z79899 Other long term (current) drug therapy: Secondary | ICD-10-CM | POA: Diagnosis not present

## 2023-06-24 DIAGNOSIS — I255 Ischemic cardiomyopathy: Secondary | ICD-10-CM | POA: Diagnosis not present

## 2023-06-24 DIAGNOSIS — Z7902 Long term (current) use of antithrombotics/antiplatelets: Secondary | ICD-10-CM | POA: Diagnosis not present

## 2023-06-24 DIAGNOSIS — Z8673 Personal history of transient ischemic attack (TIA), and cerebral infarction without residual deficits: Secondary | ICD-10-CM | POA: Diagnosis not present

## 2023-06-24 DIAGNOSIS — Z7984 Long term (current) use of oral hypoglycemic drugs: Secondary | ICD-10-CM | POA: Insufficient documentation

## 2023-06-24 LAB — BASIC METABOLIC PANEL WITH GFR
Anion gap: 10 (ref 5–15)
BUN: 8 mg/dL (ref 8–23)
CO2: 22 mmol/L (ref 22–32)
Calcium: 9.1 mg/dL (ref 8.9–10.3)
Chloride: 106 mmol/L (ref 98–111)
Creatinine, Ser: 0.77 mg/dL (ref 0.61–1.24)
GFR, Estimated: >60 mL/min (ref >60)
Glucose, Bld: 148 mg/dL — ABNORMAL HIGH (ref 70–99)
Potassium: 4.3 mmol/L (ref 3.5–5.1)
Sodium: 138 mmol/L (ref 135–145)

## 2023-06-24 NOTE — Patient Instructions (Signed)
 When you have your ear surgery PLEASE HOLD YOUR PLAVIX  5 DAYS BEFORE THE SURGERY.  Labs done today, your results will be available in MyChart, we will contact you for abnormal readings.  Your physician has requested that you have an echocardiogram. Echocardiography is a painless test that uses sound waves to create images of your heart. It provides your doctor with information about the size and shape of your heart and how well your heart's chambers and valves are working. This procedure takes approximately one hour. There are no restrictions for this procedure. Please do NOT wear cologne, perfume, aftershave, or lotions (deodorant is allowed). Please arrive 15 minutes prior to your appointment time.  Please note: We ask at that you not bring children with you during ultrasound (echo/ vascular) testing. Due to room size and safety concerns, children are not allowed in the ultrasound rooms during exams. Our front office staff cannot provide observation of children in our lobby area while testing is being conducted. An adult accompanying a patient to their appointment will only be allowed in the ultrasound room at the discretion of the ultrasound technician under special circumstances. We apologize for any inconvenience.  Your physician recommends that you schedule a follow-up appointment in: 6 months ( October) ** PLEASE CALL THE OFFICE IN JULY TO ARRANGE YOUR FOLLOW UP APPOINTMENT.**  If you have any questions or concerns before your next appointment please send us  a message through [facility] or call our office at [phone].    TO LEAVE A MESSAGE FOR THE NURSE SELECT OPTION 2, PLEASE LEAVE A MESSAGE INCLUDING: YOUR NAME DATE OF BIRTH CALL BACK NUMBER REASON FOR CALL**this is important as we prioritize the call backs  YOU WILL RECEIVE A CALL BACK THE SAME DAY AS LONG AS YOU CALL BEFORE 4:00 PM  At the [facility], you and your health needs are our priority. As part of our  continuing mission to provide you with exceptional heart care, we have created designated Provider Care Teams. These Care Teams include your primary Cardiologist (physician) and Advanced Practice Providers (APPs- Physician Assistants and Nurse Practitioners) who all work together to provide you with the care you need, when you need it.   You may see any of the following providers on your designated Care Team at your next follow up: Dr Jules Oar Dr Peder Bourdon Dr. Alwin Baars Dr. Arta Lark Amy Marijane Shoulders, NP Ruddy Corral, [place] [facility] [place], [place] Dennise Fitz, NP [place] Lee, NP Shawnee Dellen, NP Luster Salters, PharmD Bevely Brush, PharmD   Please be sure to bring in all your medications bottles to every appointment.    Thank you for choosing [facility]

## 2023-06-24 NOTE — Progress Notes (Signed)
 [facility] Note  PCP: Dr. Abraham Abo HF Cardiologist: Dr. Mitzie Anda  Chief Complaint: Heart Failure/Surgical Clearance   HPI: Vincent Horton is a 68 y.o.male with a hx of CAD, VF arrest, and systolic CHF/iCM. Patient is [name] and speaks minimal [place].   He was admitted 07/21/20 with Vfib arrest.  He had an emergent heart catheterization which revealed an occluded left anterior descending artery.  This was stented by Dr. [place] and he was started on ASA + Brilinta . Echocardiogram showed EF of 30 to 35% with moderately decreased LV function. He was kept in the ICU and required vasopressors for hypotension and respiratory failure needing ventilatory support.  He developed aspiration pneumonia and right pleural effusion requiring thoracocentesis with removal of 450 cc of fluid. He developed altered mental status and MRI scan showed punctate acute to subacute frontal infarct, but no evidence of hypoxic ischemic brain injury. There was no definite atrial fibrillation inpatient on telemetry.  He was transferred to CIR for rehab with Zio 14 day monitor, this showed no atrial fibrillation.     Follow up with general cardiology 9/22, he was off his Brilinta  for unclear reasons. Plavix  and valsartan  started this visit.  Echo 3/23 showed EF improved to 45% with apical septal akinesis, normal RV.  Follow up 12/23, NYHA I-II and volume stable. Spiro 12.5 started, > 1 year post-PCI and ASA stopped.  Today he returns for HF follow up with his daughter. 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 .Overall feeling fine. Denies SOB/PND/Orthopnea. Denies chest pain. Very active at home. Appetite ok. No fever or chills. Weight at home has been stable.  Taking all medications.  Labs (7/22): LDL 40 Labs (4/23): digoxin  0.4, K 4.1, creatinine 1.11, LDL 22, HDL 38 Labs (6/23): K 4.3, creatinine 0.91 Labs (8/23): K 4, creatinine 0.86, TSH normal Labs (12/23):  K 4.3, creatinine 0.89, LDL 67  PMH: 1. CAD: Anterior STEMI in 5/22 with totally occluded pLAD treated with DES.  The was complicated by distal embolization with occluded distal LAD.  2. Chronic systolic CHF: Ischemic cardiomyopathy.  Echo (5/22) with EF 30-35%, normal RV.  - Echo (3/23): EF 45%, apical septal akinesis, normal RV.  3. VF arrest in setting of STEMI 4. Dysphagia: Aspiration risk.  5. CVA: Subacute frontal infarct noted 5/22 admission.  Atrial fibrillation has not been visualized.  6. COVID-19 4/23.   Current Outpatient Medications  Medication Sig Dispense Refill   carvedilol  (COREG ) 3.125 MG tablet Take 1 tablet (3.125 mg total) by mouth 2 (two) times daily. NEEDS FOLLOW UP APPOINTMENT FOR MORE REFILL S 180 tablet 0   clopidogrel  (PLAVIX ) 75 MG tablet Take 1 tablet (75 mg total) by mouth daily. NEEDS FOLLOW UP APPOINTMENT FOR MORE REFILLS 90 tablet 0   diclofenac  Sodium (VOLTAREN ) 1 % GEL Apply 2 g topically 4 (four) times daily. 350 g 2   ENTRESTO  24-26 MG TAKE 1 TABLET BY MOUTH TWICE A DAY 60 tablet 5   FARXIGA  10 MG TABS tablet TAKE 1 TABLET BY MOUTH EVERY DAY BEFORE BREAKFAST 90 tablet 1   rosuvastatin  (CRESTOR ) 40 MG tablet TAKE 1 TABLET BY MOUTH EVERY DAY 90 tablet 3   spironolactone  (ALDACTONE ) 25 MG tablet TAKE 1/2 TABLET BY MOUTH EVERY DAY 45 tablet 3   Vitamin D , Ergocalciferol , (DRISDOL ) 1.25 MG (50000 UNIT) CAPS capsule Take 1 capsule (50,000 Units total) by mouth every 7 (seven) days. 12 capsule 0   No current facility-administered  medications for this encounter.   Allergies  Allergen Reactions   Beef-Derived Drug Products Other (See Comments)    Patient is Muslim - cannot get ANY beef, pork, or poultry products (food and medications included)   Pork-Derived Products Other (See Comments)    Patient is Muslim - cannot get ANY beef, pork, or poultry products (food and medications included)   Poultry Meal Other (See Comments)    Patient is Muslim - cannot get  ANY beef, pork, or poultry products (food and medications included)   Social History   Socioeconomic History   Marital status: Widowed    Spouse name: Not on file   Number of children: Not on file   Years of education: Not on file   Highest education level: Not on file  Occupational History   Not on file  Tobacco Use   Smoking status: Former    Current packs/day: 1.00    Types: Cigarettes    Passive exposure: Past   Smokeless tobacco: Never  Vaping Use   Vaping status: Unknown  Substance and Sexual Activity   Alcohol use: Not Currently   Drug use: Not Currently   Sexual activity: Not on file  Other Topics Concern   Not on file  Social History Narrative   Lives with his 4 daughters   Right Handed   Drinks 3-4 cups caffeine   Social Drivers of Health   Financial Resource Strain: Low Risk  (10/02/2022)   Overall Financial Resource Strain (CARDIA)    Difficulty of Paying Living Expenses: Not hard at all  Food Insecurity: No Food Insecurity (10/02/2022)   Hunger Vital Sign    Worried About Running Out of Food in the Last Year: Never true    Ran Out of Food in the Last Year: Never true  Transportation Needs: No Transportation Needs (10/02/2022)   PRAPARE - Administrator, Civil Service (Medical): No    Lack of Transportation (Non-Medical): No  Physical Activity: Inactive (10/02/2022)   Exercise Vital Sign    Days of Exercise per Week: 0 days    Minutes of Exercise per Session: 0 min  Stress: No Stress Concern Present (10/02/2022)   [name]    Feeling of Stress : Not at all  Social Connections: Moderately Isolated (10/02/2022)   [name]    Frequency of Communication with Friends and Family: More than three times a week    Frequency of Social Gatherings with Friends and Family: Three times a week    Attends Religious Services: More than 4 times per year    Active  Member of Clubs or Organizations: No    Attends Banker Meetings: Never    Marital Status: Widowed  Intimate Partner Violence: Not At Risk (10/02/2022)   Humiliation, Afraid, Rape, and Kick questionnaire    Fear of Current or Ex-Partner: No    Emotionally Abused: No    Physically Abused: No    Sexually Abused: No   Family History  Family history unknown: Yes   BP 122/80   Pulse 96   Ht 5' 5.5" (1.664 m)   Wt 68.7 kg (151 lb 6.4 oz)   SpO2 96%   BMI 24.81 kg/m   Wt Readings from Last 3 Encounters:  06/24/23 68.7 kg (151 lb 6.4 oz)  05/01/23 69.1 kg (152 lb 6.4 oz)  04/15/23 68.6 kg (151 lb 3.2 oz)   PHYSICAL EXAM: General:  No resp difficulty Neck: supple. no JVD.  Cor: PMI nondisplaced. Regular rate & rhythm. No rubs, gallops or murmurs. Lungs: clear Abdomen: soft, nontender, nondistended.  Extremities: no cyanosis, clubbing, rash, edema Neuro: alert & oriented x3  EKG: SR 92 bpm   ASSESSMENT & PLAN:  1. CAD: S/p anterior STEMI with occluded proximal LAD in 5/22.  Treated with DES but complicated by embolization to distal LAD (procedure completed with occluded distal LAD).  No chest pain.  - Continue Plavix .   - Continue Crestor  40 mg daily.  2. Chronic systolic heart failure: Ischemic cardiomyopathy. Echo (6/22) with EF 30-35%, no LV thrombus, LAD territory WMAs, RV normal, IVC normal. Echo in 3/23 with EF improved to 45%, normal RV.   NYHA I - Continiue Farxiga  10 mg daily. No GU symptoms. - Continue Coreg  3.125 mg bid.  - Continue Entresto  24/26 bid.  BMET today. - Continue spironolactone  12.5 mg daily. - He is out of ICD range to ensure EF stability.  Check Echo next visit. Check BMET  3. History of cardiac arrest 2022: VF arrest associated with STEMI. Patient shocked x 5, 15 min CPR before ROSC.  No further VT.   - Now out of ICD range.  - Continue Coreg .  4. CVA: MRI 6/7 with punctate acute to subacute frontal infarct. No obvious deficits.  -  Continue DAPT. 5. Squamous Cell Carcinoma Planning for surgery/reconstruction 07/29/23. Discussed with Dr Mitzie Anda. Acceptable for surgical. Will need to hold plavix  5 days prior to surgery. Restart plavix  after surgery. Discussed with his daughter and placed on AVS.    Check BMET   Majesta Leichter Marijane Shoulders, NP-C  06/24/2023

## 2023-07-01 ENCOUNTER — Other Ambulatory Visit (HOSPITAL_COMMUNITY): Payer: Self-pay | Admitting: Family Medicine

## 2023-07-01 ENCOUNTER — Other Ambulatory Visit: Payer: Self-pay | Admitting: Cardiology

## 2023-08-24 ENCOUNTER — Other Ambulatory Visit (HOSPITAL_COMMUNITY): Payer: Self-pay | Admitting: Family Medicine

## 2023-09-07 ENCOUNTER — Ambulatory Visit (HOSPITAL_COMMUNITY)

## 2023-10-02 ENCOUNTER — Ambulatory Visit (HOSPITAL_COMMUNITY)
Admission: RE | Admit: 2023-10-02 | Discharge: 2023-10-02 | Disposition: A | Discharge status: Home / Self Care | Source: Ambulatory Visit | Attending: Adult Health | Admitting: Adult Health

## 2023-10-02 DIAGNOSIS — Z8673 Personal history of transient ischemic attack (TIA), and cerebral infarction without residual deficits: Secondary | ICD-10-CM | POA: Diagnosis not present

## 2023-10-02 DIAGNOSIS — I252 Old myocardial infarction: Secondary | ICD-10-CM | POA: Diagnosis not present

## 2023-10-02 DIAGNOSIS — I255 Ischemic cardiomyopathy: Secondary | ICD-10-CM | POA: Insufficient documentation

## 2023-10-02 DIAGNOSIS — Z87891 Personal history of nicotine dependence: Secondary | ICD-10-CM | POA: Diagnosis not present

## 2023-10-02 DIAGNOSIS — I5042 Chronic combined systolic (congestive) and diastolic (congestive) heart failure: Secondary | ICD-10-CM | POA: Diagnosis present

## 2023-10-02 LAB — ECHOCARDIOGRAM COMPLETE
AR max vel: 2.7 cm2
AV Area VTI: 2.58 cm2
AV Area mean vel: 2.56 cm2
AV Mean grad: 3 mmHg
AV Peak grad: 5.9 mmHg
Ao pk vel: 1.21 m/s
Area-P 1/2: 4.49 cm2
Calc EF: 57.1 %
S' Lateral: 3.4 cm
Single Plane A2C EF: 58.2 %
Single Plane A4C EF: 55.8 %

## 2023-10-05 ENCOUNTER — Ambulatory Visit (HOSPITAL_COMMUNITY): Payer: Self-pay | Admitting: Adult Health

## 2023-10-29 ENCOUNTER — Encounter: Payer: Self-pay | Admitting: Family Medicine

## 2023-10-29 ENCOUNTER — Ambulatory Visit (INDEPENDENT_AMBULATORY_CARE_PROVIDER_SITE_OTHER): Payer: 59 | Admitting: Family Medicine

## 2023-10-29 VITALS — BP 126/77 | HR 67 | Ht 65.5 in | Wt 154.2 lb

## 2023-10-29 DIAGNOSIS — I252 Old myocardial infarction: Secondary | ICD-10-CM | POA: Diagnosis not present

## 2023-10-29 DIAGNOSIS — Z8673 Personal history of transient ischemic attack (TIA), and cerebral infarction without residual deficits: Secondary | ICD-10-CM

## 2023-10-29 DIAGNOSIS — Z789 Other specified health status: Secondary | ICD-10-CM

## 2023-10-29 DIAGNOSIS — H6192 Disorder of left external ear, unspecified: Secondary | ICD-10-CM | POA: Diagnosis not present

## 2023-10-29 NOTE — Progress Notes (Signed)
 Established Patient Office Visit  Subjective    Patient ID: Vincent Horton, male    DOB: [birth year]  Age: 68 y.o. MRN: [id number]  CC:  Chief Complaint  Patient presents with   Medical Management of Chronic Issues    HPI Vincent Horton presents for follow up. He has had surgery on his left ear for squamous cell carcinoma that was successful. Patient also has had follow up with cardiology. This visit was aided by an interpreter.   Outpatient Encounter Medications as of 10/29/2023  Medication Sig   carvedilol  (COREG ) 3.125 MG tablet Take 1 tablet (3.125 mg total) by mouth 2 (two) times daily. NEEDS FOLLOW UP APPOINTMENT FOR MORE REFILL S   clopidogrel  (PLAVIX ) 75 MG tablet TAKE 1 TABLET (75 MG TOTAL) BY MOUTH DAILY. NEEDS FOLLOW UP APPOINTMENT FOR MORE REFILLS   dapagliflozin  propanediol (FARXIGA ) 10 MG TABS tablet TAKE 1 TABLET BY MOUTH EVERY DAY BEFORE BREAKFAST   ENTRESTO  24-26 MG TAKE 1 TABLET BY MOUTH TWICE A DAY   rosuvastatin  (CRESTOR ) 40 MG tablet TAKE 1 TABLET BY MOUTH EVERY DAY   spironolactone  (ALDACTONE ) 25 MG tablet TAKE 1/2 TABLET BY MOUTH EVERY DAY   diclofenac  Sodium (VOLTAREN ) 1 % GEL Apply 2 g topically 4 (four) times daily. (Patient not taking: Reported on 10/29/2023)   Vitamin D , Ergocalciferol , (DRISDOL ) 1.25 MG (50000 UNIT) CAPS capsule Take 1 capsule (50,000 Units total) by mouth every 7 (seven) days. (Patient not taking: Reported on 10/29/2023)   No facility-administered encounter medications on file as of 10/29/2023.    Past Medical History:  Diagnosis Date   Cardiac arrest [facility])    Coronary artery disease    Dysrhythmia    Myocardial infarction [facility])    Stroke [facility])     Past Surgical History:  Procedure Laterality Date   CARDIAC CATHETERIZATION     CORONARY/GRAFT ACUTE MI REVASCULARIZATION N/A 07/21/2020   Procedure: Coronary/Graft Acute MI Revascularization;  Surgeon: Swaziland, Peter M, MD;  Location: [facility] INVASIVE CV LAB;  Service: Cardiovascular;  Laterality: N/A;    LEFT HEART CATH AND CORONARY ANGIOGRAPHY N/A 07/21/2020   Procedure: LEFT HEART CATH AND CORONARY ANGIOGRAPHY;  Surgeon: Swazil[place], Peter M, MD;  Location: [facility] INVASIVE CV LAB;  Service: Cardiovascular;  Laterality: N/A;   RIGHT HEART CATH N/A 07/21/2020   Procedure: RIGHT HEART CATH;  Surgeon: Swaziland, Peter M, MD;  Location: [facility] INVASIVE CV LAB;  Service: Cardiovascular;  Laterality: N/A;    Family History  Family history unknown: Yes    Social History   Socioeconomic History   Marital status: Widowed    Spouse name: Not on file   Number of children: Not on file   Years of education: Not on file   Highest education level: Not on file  Occupational History   Not on file  Tobacco Use   Smoking status: Former    Current packs/day: 1.00    Types: Cigarettes    Passive exposure: Past   Smokeless tobacco: Never  Vaping Use   Vaping status: Unknown  Substance and Sexual Activity   Alcohol use: Not Currently   Drug use: Not Currently   Sexual activity: Not on file  Other Topics Concern   Not on file  Social History Narrative   Lives with his 4 daughters   Right Handed   Drinks 3-4 cups caffeine   Social Drivers of Health   Financial Resource Strain: Low Risk  (10/02/2022)   Overall Financial Resource Strain (CARDIA)  Difficulty of Paying Living Expenses: Not hard at all  Food Insecurity: Low Risk  (08/20/2023)   Received from [facility]   Hunger Vital Sign    Within the past 12 months, you worried that your food would run out before you got money to buy more: Never true    Within the past 12 months, the food you bought just didn't last and you didn't have money to get more. : Never true  Transportation Needs: No Transportation Needs (08/20/2023)   Received from [facility]    In the past 12 months, has lack of reliable transportation kept you from medical appointments, meetings, work or from getting things needed for daily living? : No  Physical Activity:  Inactive (10/02/2022)   Exercise Vital Sign    Days of Exercise per Week: 0 days    Minutes of Exercise per Session: 0 min  Stress: No Stress Concern Present (10/02/2022)   [name]    Feeling of Stress : Not at all  Social Connections: Moderately Isolated (10/02/2022)   Social Connection and Isolation Panel    Frequency of Communication with Friends and Family: More than three times a week    Frequency of Social Gatherings with Friends and Family: Three times a week    Attends Religious Services: More than 4 times per year    Active Member of Clubs or Organizations: No    Attends Banker Meetings: Never    Marital Status: Widowed  Intimate Partner Violence: Not At Risk (10/02/2022)   Humiliation, Afraid, Rape, and Kick questionnaire    Fear of Current or Ex-Partner: No    Emotionally Abused: No    Physically Abused: No    Sexually Abused: No    Review of Systems  All other systems reviewed and are negative.       Objective    BP 126/77   Pulse 67   Ht 5' 5.5 (1.664 m)   Wt 154 lb 3.2 oz (69.9 kg)   SpO2 96%   BMI 25.27 kg/m   Physical Exam Vitals and nursing note reviewed.  Constitutional:      General: He is not in acute distress. Cardiovascular:     Rate and Rhythm: Normal rate and regular rhythm.  Pulmonary:     Effort: Pulmonary effort is normal.     Breath sounds: Normal breath sounds.  Abdominal:     Palpations: Abdomen is soft.     Tenderness: There is no abdominal tenderness.  Neurological:     General: No focal deficit present.     Mental Status: He is alert and oriented to person, place, and time.         Assessment & Plan:   Skin lesion of left external ear  History of ST elevation myocardial infarction (STEMI)  History of CVA (cerebrovascular accident)  Language barrier to communication   Patient is improved and appears stable. Management as per consultants.    Return in about 1 year (around 10/28/2024) for physical.   Tanda Raguel SQUIBB, MD

## 2023-12-11 ENCOUNTER — Other Ambulatory Visit: Payer: Self-pay | Admitting: Cardiology

## 2023-12-30 ENCOUNTER — Other Ambulatory Visit (HOSPITAL_COMMUNITY): Payer: Self-pay | Admitting: Cardiology

## 2024-01-30 ENCOUNTER — Other Ambulatory Visit: Payer: Self-pay | Admitting: Cardiology

## 2024-03-01 ENCOUNTER — Other Ambulatory Visit: Payer: Self-pay | Admitting: Family Medicine

## 2024-03-01 DIAGNOSIS — I252 Old myocardial infarction: Secondary | ICD-10-CM

## 2024-03-01 NOTE — Telephone Encounter (Unsigned)
 Copied from CRM [id number]. Topic: Clinical - Medication Refill >> Mar 01, 2024  4:26 PM Sasha M wrote: Medication: rosuvastatin  (CRESTOR ) 40 MG tablet  Has the patient contacted their pharmacy? No (Agent: If no, request that the patient contact the pharmacy for the refill. If patient does not wish to contact the pharmacy document the reason why and proceed with request.) (Agent: If yes, when and what did the pharmacy advise?)  This is the patient's preferred pharmacy:   [id number] - [place], [place] - [address] AT [facility] [address] [place] [address] Phone: [phone] Fax: [phone]  Is this the correct pharmacy for this prescription? Yes If no, delete pharmacy and type the correct one.   Has the prescription been filled recently? No  Is the patient out of the medication? No  Has the patient been seen for an appointment in the last year OR does the patient have an upcoming appointment? Yes  Can we respond through MyChart? Yes and phone if needed  Agent: Please be advised that Rx refills may take up to 3 business days. We ask that you follow-up with your pharmacy.

## 2024-03-02 ENCOUNTER — Other Ambulatory Visit (HOSPITAL_COMMUNITY): Payer: Self-pay | Admitting: Cardiology

## 2024-03-02 MED ORDER — CLOPIDOGREL BISULFATE 75 MG PO TABS: 75.0000 mg | ORAL_TABLET | Freq: Every day | ORAL | 1 refills | Status: AC

## 2024-03-03 ENCOUNTER — Other Ambulatory Visit: Payer: Self-pay | Admitting: Cardiology

## 2024-03-03 DIAGNOSIS — I252 Old myocardial infarction: Secondary | ICD-10-CM

## 2024-04-07 ENCOUNTER — Telehealth (HOSPITAL_COMMUNITY): Payer: Self-pay

## 2024-04-07 MED ORDER — SACUBITRIL-VALSARTAN 24-26 MG PO TABS: 1.0000 | ORAL_TABLET | Freq: Two times a day (BID) | ORAL | 1 refills | Status: AC

## 2024-04-07 NOTE — Telephone Encounter (Signed)
 Refill sent to pharmacy.

## 2024-04-13 ENCOUNTER — Ambulatory Visit (HOSPITAL_COMMUNITY)

## 2024-10-31 ENCOUNTER — Encounter: Admitting: Family Medicine
